# Patient Record
Sex: Female | Born: 1950 | Race: Black or African American | Hispanic: No | Smoking: Never smoker
Health system: Southern US, Community
[De-identification: ages and names within clinical notes are randomized; demographics above are authoritative.]

## PROBLEM LIST (undated history)

## (undated) DIAGNOSIS — K219 Gastro-esophageal reflux disease without esophagitis: Secondary | ICD-10-CM

## (undated) DIAGNOSIS — R0600 Dyspnea, unspecified: Secondary | ICD-10-CM

## (undated) DIAGNOSIS — E039 Hypothyroidism, unspecified: Secondary | ICD-10-CM

## (undated) DIAGNOSIS — M1990 Unspecified osteoarthritis, unspecified site: Secondary | ICD-10-CM

## (undated) DIAGNOSIS — E079 Disorder of thyroid, unspecified: Secondary | ICD-10-CM

## (undated) DIAGNOSIS — N289 Disorder of kidney and ureter, unspecified: Secondary | ICD-10-CM

## (undated) DIAGNOSIS — E669 Obesity, unspecified: Secondary | ICD-10-CM

## (undated) DIAGNOSIS — J45909 Unspecified asthma, uncomplicated: Secondary | ICD-10-CM

## (undated) DIAGNOSIS — R42 Dizziness and giddiness: Secondary | ICD-10-CM

## (undated) DIAGNOSIS — E785 Hyperlipidemia, unspecified: Secondary | ICD-10-CM

## (undated) DIAGNOSIS — I10 Essential (primary) hypertension: Secondary | ICD-10-CM

## (undated) DIAGNOSIS — E119 Type 2 diabetes mellitus without complications: Secondary | ICD-10-CM

## (undated) HISTORY — PX: CARDIAC CATHETERIZATION: SHX172

## (undated) HISTORY — PX: HERNIA REPAIR: SHX51

## (undated) HISTORY — DX: Essential (primary) hypertension: I10

## (undated) HISTORY — DX: Obesity, unspecified: E66.9

## (undated) HISTORY — DX: Type 2 diabetes mellitus without complications: E11.9

## (undated) HISTORY — PX: EYE SURGERY: SHX253

## (undated) HISTORY — DX: Disorder of thyroid, unspecified: E07.9

## (undated) HISTORY — DX: Unspecified osteoarthritis, unspecified site: M19.90

## (undated) HISTORY — DX: Gastro-esophageal reflux disease without esophagitis: K21.9

## (undated) HISTORY — DX: Hyperlipidemia, unspecified: E78.5

## (undated) HISTORY — DX: Dyspnea, unspecified: R06.00

---

## 1985-05-24 DIAGNOSIS — E119 Type 2 diabetes mellitus without complications: Secondary | ICD-10-CM

## 1985-05-24 HISTORY — DX: Type 2 diabetes mellitus without complications: E11.9

## 2005-06-08 ENCOUNTER — Ambulatory Visit: Payer: Self-pay

## 2006-09-19 ENCOUNTER — Other Ambulatory Visit: Payer: Self-pay

## 2006-09-20 ENCOUNTER — Inpatient Hospital Stay: Payer: Self-pay | Admitting: *Deleted

## 2006-10-13 ENCOUNTER — Ambulatory Visit: Payer: Self-pay | Admitting: Family Medicine

## 2008-03-14 ENCOUNTER — Ambulatory Visit: Payer: Self-pay | Admitting: Family Medicine

## 2008-04-01 ENCOUNTER — Ambulatory Visit: Payer: Self-pay | Admitting: Gastroenterology

## 2008-08-04 ENCOUNTER — Ambulatory Visit: Payer: Self-pay | Admitting: Family Medicine

## 2008-08-08 ENCOUNTER — Emergency Department: Payer: Self-pay | Admitting: Unknown Physician Specialty

## 2008-09-13 ENCOUNTER — Encounter: Payer: Self-pay | Admitting: Internal Medicine

## 2008-09-13 ENCOUNTER — Ambulatory Visit: Payer: Self-pay | Admitting: Internal Medicine

## 2008-09-13 DIAGNOSIS — R0609 Other forms of dyspnea: Secondary | ICD-10-CM

## 2008-09-13 DIAGNOSIS — R0602 Shortness of breath: Secondary | ICD-10-CM

## 2008-09-13 DIAGNOSIS — R0989 Other specified symptoms and signs involving the circulatory and respiratory systems: Secondary | ICD-10-CM

## 2008-09-15 LAB — CONVERTED CEMR LAB
Calcium: 9.3 mg/dL (ref 8.4–10.5)
INR: 1 (ref 0.0–1.5)
Lymphocytes Relative: 30 % (ref 12–46)
Lymphs Abs: 0.8 10*3/uL (ref 0.7–4.0)
MCV: 85.6 fL (ref 78.0–100.0)
Monocytes Relative: 8 % (ref 3–12)
Neutro Abs: 1.4 10*3/uL — ABNORMAL LOW (ref 1.7–7.7)
Neutrophils Relative %: 57 % (ref 43–77)
Potassium: 4 meq/L (ref 3.5–5.3)
Prothrombin Time: 13.3 s (ref 11.6–15.2)
RBC: 3.95 M/uL (ref 3.87–5.11)
Sodium: 143 meq/L (ref 135–145)
WBC: 2.5 10*3/uL — ABNORMAL LOW (ref 4.0–10.5)

## 2008-09-16 ENCOUNTER — Inpatient Hospital Stay (HOSPITAL_BASED_OUTPATIENT_CLINIC_OR_DEPARTMENT_OTHER): Admission: RE | Admit: 2008-09-16 | Discharge: 2008-09-16 | Payer: Self-pay | Admitting: Internal Medicine

## 2008-09-16 ENCOUNTER — Ambulatory Visit: Payer: Self-pay | Admitting: Internal Medicine

## 2008-09-25 ENCOUNTER — Ambulatory Visit: Payer: Self-pay | Admitting: Internal Medicine

## 2008-09-25 ENCOUNTER — Encounter: Payer: Self-pay | Admitting: Internal Medicine

## 2008-09-25 DIAGNOSIS — D649 Anemia, unspecified: Secondary | ICD-10-CM | POA: Insufficient documentation

## 2008-09-30 LAB — CONVERTED CEMR LAB
BUN: 31 mg/dL — ABNORMAL HIGH
Basophils Absolute: 0 10*3/uL
Basophils Relative: 1 %
CO2: 23 meq/L
Calcium: 9.4 mg/dL
Chloride: 109 meq/L
Creatinine, Ser: 1.22 mg/dL — ABNORMAL HIGH
Eosinophils Absolute: 0.1 10*3/uL
Eosinophils Relative: 5 %
Glucose, Bld: 65 mg/dL — ABNORMAL LOW
HCT: 36 %
Hemoglobin: 11.2 g/dL — ABNORMAL LOW
Lymphocytes Relative: 38 %
Lymphs Abs: 0.8 10*3/uL
MCHC: 31.1 g/dL
MCV: 87.4 fL
Monocytes Absolute: 0.2 10*3/uL
Monocytes Relative: 12 %
Neutro Abs: 0.9 10*3/uL — ABNORMAL LOW
Neutrophils Relative %: 45 %
Platelets: 179 10*3/uL
Potassium: 4.2 meq/L
RBC: 4.12 M/uL
RDW: 15 %
Sodium: 143 meq/L
WBC: 2 10*3/uL — ABNORMAL LOW

## 2008-10-08 ENCOUNTER — Ambulatory Visit: Payer: Self-pay

## 2008-10-08 ENCOUNTER — Encounter: Payer: Self-pay | Admitting: Internal Medicine

## 2008-12-02 ENCOUNTER — Ambulatory Visit: Payer: Self-pay | Admitting: Otolaryngology

## 2009-03-23 ENCOUNTER — Ambulatory Visit: Payer: Self-pay | Admitting: Internal Medicine

## 2009-04-24 ENCOUNTER — Emergency Department: Payer: Self-pay | Admitting: Emergency Medicine

## 2009-06-11 ENCOUNTER — Ambulatory Visit: Payer: Self-pay | Admitting: Family Medicine

## 2009-09-12 ENCOUNTER — Ambulatory Visit: Payer: Self-pay | Admitting: Family Medicine

## 2009-10-20 ENCOUNTER — Ambulatory Visit: Payer: Self-pay | Admitting: Family Medicine

## 2009-10-22 ENCOUNTER — Ambulatory Visit: Payer: Self-pay | Admitting: Family Medicine

## 2010-05-20 ENCOUNTER — Ambulatory Visit: Payer: Self-pay | Admitting: Family Medicine

## 2010-07-25 ENCOUNTER — Ambulatory Visit: Payer: Self-pay | Admitting: Internal Medicine

## 2010-09-02 LAB — POCT I-STAT 3, ART BLOOD GAS (G3+)
Acid-base deficit: 2 mmol/L (ref 0.0–2.0)
O2 Saturation: 91 %
TCO2: 24 mmol/L (ref 0–100)
pCO2 arterial: 37 mmHg (ref 35.0–45.0)
pO2, Arterial: 60 mmHg — ABNORMAL LOW (ref 80.0–100.0)

## 2010-09-02 LAB — POCT I-STAT 3, VENOUS BLOOD GAS (G3P V)
Acid-base deficit: 1 mmol/L (ref 0.0–2.0)
Acid-base deficit: 1 mmol/L (ref 0.0–2.0)
Acid-base deficit: 3 mmol/L — ABNORMAL HIGH (ref 0.0–2.0)
Bicarbonate: 23 mEq/L (ref 20.0–24.0)
Bicarbonate: 24.7 mEq/L — ABNORMAL HIGH (ref 20.0–24.0)
O2 Saturation: 65 %
O2 Saturation: 92 %
TCO2: 24 mmol/L (ref 0–100)
TCO2: 25 mmol/L (ref 0–100)
TCO2: 26 mmol/L (ref 0–100)
pH, Ven: 7.36 — ABNORMAL HIGH (ref 7.250–7.300)
pO2, Ven: 33 mmHg (ref 30.0–45.0)
pO2, Ven: 36 mmHg (ref 30.0–45.0)

## 2010-09-02 LAB — POCT I-STAT GLUCOSE: Glucose, Bld: 111 mg/dL — ABNORMAL HIGH (ref 70–99)

## 2010-09-14 ENCOUNTER — Emergency Department: Payer: Self-pay | Admitting: Emergency Medicine

## 2010-09-24 ENCOUNTER — Emergency Department: Payer: Self-pay | Admitting: Emergency Medicine

## 2010-10-01 ENCOUNTER — Telehealth: Payer: Self-pay | Admitting: Internal Medicine

## 2010-10-01 NOTE — Telephone Encounter (Signed)
Faxed OV & Echo to [facility] ([phone]).

## 2010-10-02 ENCOUNTER — Ambulatory Visit: Payer: Self-pay | Admitting: General Surgery

## 2010-10-06 ENCOUNTER — Ambulatory Visit: Payer: Self-pay | Admitting: General Surgery

## 2010-10-06 NOTE — Cardiovascular Report (Signed)
NAMEADRI, [name] NO.:  [id number]   MEDICAL RECORD NO.:  [id number]          PATIENT TYPE:  OIB   LOCATION:  1961                         FACILITY:  [facility]   PHYSICIAN:  Bevelyn Buckles. Bensimhon, MDDATE OF BIRTH:  [birth year]   DATE OF PROCEDURE:  09/16/2008  DATE OF DISCHARGE:                            CARDIAC CATHETERIZATION   PRIMARY CARE PHYSICIAN:  Vonita Moss, MD   PATIENT IDENTIFICATION:  Mary Parks is a delightful 60 year old woman  who presented to the office with chest pain and dyspnea.  Stress test  versus cardiac catheterization were discussed and we decided to proceed  with right and left heart catheterization.   PROCEDURES PERFORMED:  1. Right heart catheterization.  2. Left heart catheterization.  3. Selective coronary angiography.  4. Left ventriculogram.   DESCRIPTION OF PROCEDURE:  The risks and indications were explained,  consent was signed and placed on the chart.  A 4-French arterial sheath  was placed in the right femoral artery using modified Seldinger  technique.  Standard catheters including JL-4, JR-4, and angled pigtail  were used for procedure.  All catheter exchanges were made over wire.  There were no apparent complications.  A 7-French venous sheath was  placed in the right femoral vein and standard Swan-Ganz catheter was  used for right heart cath.  Once again, no apparent complications.   HEMODYNAMIC RESULTS:  Right atrial pressure mean of 11, RV pressure 35/8  with an EDP of 15.  PA pressure 37/17 with a mean of 26.  Pulmonary  capillary wedge pressure mean of 15.  Central aortic pressure 177/92  with a mean of 127.  LV pressure 178/26 with an EDP of 36.  There is no  aortic stenosis.  Fick cardiac output was 5.7 L/min.  Cardiac index 2.9  L/min/m2.  Pulmonary vascular resistance was 2.1 [place] [facility].  Arterial  saturations on room air were 91% and 92%.   Left main was normal.   LAD was moderate-sized vessel that gave  off two diagonals.  The distal  LAD was essentially bifid.  It was angiographically normal.   Left circumflex gave off a moderate-sized ramus branch, multiple small  OMs and a large OM branch.  It was angiographically normal.   Right coronary artery had an anterior takeoff as a very large dominant  vessel that gave off a large marginal branch, small-to-moderate PDA and  a very large posterolateral.  It was angiographically normal.   Left ventriculogram done in the RAO position showed an ejection fraction  of 60% with no regional wall motion abnormalities.  On panning down over  the distal aorta after the ventriculogram, there was no evidence of  aneurysmal dilatation.   ASSESSMENT:  1. Normal coronary arteries.  2. Normal left ventricular function.  3. Mild pulmonary venous hypertension in the setting of markedly      elevated left-sided pressures likely due to diastolic dysfunction.   PLAN/DISCUSSION:  I suspect her symptoms are related to diastolic  dysfunction and significantly elevated left ventricular end-diastolic  pressures.  I am little bit perplexed as to why her wedge  pressure is  high.  At this point, we will start low-dose diuretic and potassium in  the form of hydrochlorothiazide 12.5 once a day and 20 mEq potassium  daily.  She will also need tight blood pressure control.  I have also  discussed with her the possibility of a sleep study due to probable  underlying obstructive sleep apnea.  She will follow up with me next  week in the clinic and also see Dr. Dossie Arbour.      Bevelyn Buckles. Bensimhon, MD  Electronically Signed     DRB/MEDQ  D:  09/16/2008  T:  09/16/2008  Job:  [id number]   cc:   Vonita Moss, M.D.

## 2010-11-02 ENCOUNTER — Encounter: Payer: Self-pay | Admitting: Internal Medicine

## 2011-01-04 ENCOUNTER — Ambulatory Visit: Payer: Self-pay

## 2011-06-02 ENCOUNTER — Ambulatory Visit: Payer: Self-pay | Admitting: Family Medicine

## 2011-11-30 ENCOUNTER — Encounter: Payer: Self-pay | Admitting: Orthopedic Surgery

## 2011-12-23 ENCOUNTER — Encounter: Payer: Self-pay | Admitting: Orthopedic Surgery

## 2012-04-27 ENCOUNTER — Ambulatory Visit: Payer: Self-pay | Admitting: Internal Medicine

## 2012-07-20 ENCOUNTER — Ambulatory Visit: Payer: Self-pay | Admitting: Family Medicine

## 2013-03-22 ENCOUNTER — Ambulatory Visit: Payer: Self-pay

## 2013-03-22 LAB — URINALYSIS, COMPLETE
Bacteria: NEGATIVE
Glucose,UR: 100 mg/dL (ref 0–75)
Ketone: NEGATIVE
Leukocyte Esterase: NEGATIVE
Nitrite: NEGATIVE

## 2013-03-24 LAB — URINE CULTURE

## 2013-08-10 ENCOUNTER — Ambulatory Visit: Payer: Self-pay | Admitting: Family Medicine

## 2014-06-02 ENCOUNTER — Ambulatory Visit: Payer: Self-pay

## 2014-06-02 DIAGNOSIS — H6990 Unspecified Eustachian tube disorder, unspecified ear: Secondary | ICD-10-CM | POA: Diagnosis not present

## 2014-06-19 DIAGNOSIS — N179 Acute kidney failure, unspecified: Secondary | ICD-10-CM | POA: Diagnosis not present

## 2014-06-24 DIAGNOSIS — E1122 Type 2 diabetes mellitus with diabetic chronic kidney disease: Secondary | ICD-10-CM | POA: Diagnosis not present

## 2014-06-24 DIAGNOSIS — N183 Chronic kidney disease, stage 3 (moderate): Secondary | ICD-10-CM | POA: Diagnosis not present

## 2014-07-10 DIAGNOSIS — J329 Chronic sinusitis, unspecified: Secondary | ICD-10-CM | POA: Diagnosis not present

## 2014-08-05 DIAGNOSIS — N183 Chronic kidney disease, stage 3 (moderate): Secondary | ICD-10-CM | POA: Diagnosis not present

## 2014-08-05 DIAGNOSIS — I129 Hypertensive chronic kidney disease with stage 1 through stage 4 chronic kidney disease, or unspecified chronic kidney disease: Secondary | ICD-10-CM | POA: Diagnosis not present

## 2014-08-05 DIAGNOSIS — E785 Hyperlipidemia, unspecified: Secondary | ICD-10-CM | POA: Diagnosis not present

## 2014-08-05 DIAGNOSIS — N185 Chronic kidney disease, stage 5: Secondary | ICD-10-CM | POA: Diagnosis not present

## 2014-08-05 DIAGNOSIS — R1010 Upper abdominal pain, unspecified: Secondary | ICD-10-CM | POA: Diagnosis not present

## 2014-08-05 DIAGNOSIS — Z0000 Encounter for general adult medical examination without abnormal findings: Secondary | ICD-10-CM | POA: Diagnosis not present

## 2014-09-12 ENCOUNTER — Ambulatory Visit
Admit: 2014-09-12 | Disposition: A | Discharge status: Home / Self Care | Payer: Self-pay | Attending: Unknown Physician Specialty | Admitting: Unknown Physician Specialty

## 2014-09-12 DIAGNOSIS — Z1231 Encounter for screening mammogram for malignant neoplasm of breast: Secondary | ICD-10-CM | POA: Diagnosis not present

## 2014-09-12 LAB — HM MAMMOGRAPHY

## 2014-10-22 DIAGNOSIS — J441 Chronic obstructive pulmonary disease with (acute) exacerbation: Secondary | ICD-10-CM | POA: Diagnosis not present

## 2014-11-05 ENCOUNTER — Ambulatory Visit
Admission: RE | Admit: 2014-11-05 | Discharge: 2014-11-05 | Disposition: A | Discharge status: Home / Self Care | Payer: Medicare Other | Source: Ambulatory Visit | Attending: Family Medicine | Admitting: Family Medicine

## 2014-11-05 ENCOUNTER — Ambulatory Visit (INDEPENDENT_AMBULATORY_CARE_PROVIDER_SITE_OTHER): Payer: Medicare Other | Admitting: Family Medicine

## 2014-11-05 ENCOUNTER — Telehealth: Payer: Self-pay

## 2014-11-05 ENCOUNTER — Encounter: Payer: Self-pay | Admitting: Family Medicine

## 2014-11-05 VITALS — BP 108/72 | HR 106 | Temp 98.4°F | Ht 64.0 in | Wt 175.0 lb

## 2014-11-05 DIAGNOSIS — R05 Cough: Secondary | ICD-10-CM | POA: Diagnosis not present

## 2014-11-05 DIAGNOSIS — R0789 Other chest pain: Secondary | ICD-10-CM | POA: Diagnosis not present

## 2014-11-05 DIAGNOSIS — R062 Wheezing: Secondary | ICD-10-CM | POA: Insufficient documentation

## 2014-11-05 DIAGNOSIS — J441 Chronic obstructive pulmonary disease with (acute) exacerbation: Secondary | ICD-10-CM | POA: Diagnosis not present

## 2014-11-05 MED ORDER — PREDNISONE 10 MG PO TABS: ORAL_TABLET | ORAL | Status: AC

## 2014-11-05 MED ORDER — DOXYCYCLINE HYCLATE 100 MG PO CAPS: 100.0000 mg | ORAL_CAPSULE | Freq: Two times a day (BID) | ORAL | Status: AC

## 2014-11-05 NOTE — Progress Notes (Signed)
BP 108/72 mmHg  Pulse 106  Temp(Src) 98.4 F (36.9 C)  Ht 5\' 4"  (1.626 m)  Wt 175 lb (79.379 kg)  BMI 30.02 kg/m2  SpO2 99%   Subjective:    Patient ID: Mary Parks, female    DOB: [birth year], 64 y.o.   MRN: [id number]  HPI: Mary Parks is a 64 y.o. female  Chief Complaint  Patient presents with  . Bronchitis    follow up  COPD EXACERBATION RECHECK- Amiee presents today for a recheck on her lungs following a COPD exacerbation 2 weeks ago. She was treated with prednisone, z-pack, cough syrup and tessalon perles. She notes that today she is feeling a bit better, but still having some issues. Eyes still feeling cloudy and itchy and dry, still getting some pains in her lungs  Worst symptom: bad taste in her mouth, wheezing Fever: no Cough: yes Shortness of breath: no Wheezing: yes Chest pain: yes, with cough Chest tightness: yes Chest congestion: yes Nasal congestion: yes Runny nose: yes Post nasal drip: yes Sneezing: yes Sore throat: yes Swollen glands: no Sinus pressure: no Headache: no Face pain: no Toothache: no Ear pain: no  Ear pressure: no  Eyes red/itching:yes Eye drainage/crusting: no  Vomiting: no Rash: no Fatigue: no Sick contacts: no Strep contacts: no  Context: better Recurrent sinusitis: no Relief with OTC cold/cough medications: no  Treatments attempted: cold/sinus, mucinex, anti-histamine, pseudoephedrine, cough syrup and antibiotics   Relevant past medical, surgical, family and social history reviewed and updated as indicated. Interim medical history since our last visit reviewed. Allergies and medications reviewed and updated.  Review of Systems  Constitutional: Negative.   HENT: Negative.   Respiratory: Negative.   Cardiovascular: Negative.   Allergic/Immunologic: Positive for environmental allergies. Negative for food allergies and immunocompromised state.  Psychiatric/Behavioral: Negative.     Per HPI unless  specifically indicated above     Objective:    BP 108/72 mmHg  Pulse 106  Temp(Src) 98.4 F (36.9 C)  Ht 5\' 4"  (1.626 m)  Wt 175 lb (79.379 kg)  BMI 30.02 kg/m2  SpO2 99%  Wt Readings from Last 3 Encounters:  11/05/14 175 lb (79.379 kg)  10/22/14 178 lb (80.74 kg)  09/13/08 202 lb 12 oz (91.967 kg)    Physical Exam  Constitutional: She appears well-developed and well-nourished.  HENT:  Head: Normocephalic and atraumatic.  Right Ear: External ear normal.  Left Ear: External ear normal.  Nose: Nose normal.  Mouth/Throat: Oropharynx is clear and moist. No oropharyngeal exudate.  Eyes: Conjunctivae and EOM are normal. Pupils are equal, round, and reactive to light. Right eye exhibits no discharge. No scleral icterus.  Neck: Normal range of motion. Neck supple. No JVD present. No tracheal deviation present. No thyromegaly present.  Cardiovascular: Normal rate, regular rhythm and normal heart sounds.  Exam reveals no gallop and no friction rub.   No murmur heard. Pulmonary/Chest: Effort normal. No stridor. No respiratory distress. She has wheezes. She has no rales. She exhibits no tenderness.  Lymphadenopathy:    She has no cervical adenopathy.  Skin: Skin is warm and dry.  Psychiatric: She has a normal mood and affect. Her behavior is normal. Judgment and thought content normal.  Nursing note and vitals reviewed.     Assessment & Plan:   Problem List Items Addressed This Visit      Respiratory   COPD exacerbation - Primary    Will obtain CXR to make sure there is not a pneumonia.  Wheezes and rhonchi at the bases bilaterally. Will start doxycycline, and will switch to Levaquin if evidence of pneumonia on CXR. Will do another course of prednisone taper. Will have her call with concerns or if she is not doing better or doing worse. Will recheck her lungs in 2 weeks to confirm resolution.       Relevant Medications   predniSONE (DELTASONE) 10 MG tablet   Other Relevant Orders    DG Chest 2 View       Follow up plan: Return in about 2 weeks (around 11/19/2014), or Lung recheck.

## 2014-11-05 NOTE — Patient Instructions (Signed)
Shortness of Breath Shortness of breath means you have trouble breathing. Shortness of breath needs medical care right away. HOME CARE   Do not smoke.  Avoid being around chemicals or things (paint fumes, dust) that may bother your breathing.  Rest as needed. Slowly begin your normal activities.  Only take medicines as told by your doctor.  Keep all doctor visits as told. GET HELP RIGHT AWAY IF:   Your shortness of breath gets worse.  You feel lightheaded, pass out (faint), or have a cough that is not helped by medicine.  You cough up blood.  You have pain with breathing.  You have pain in your chest, arms, shoulders, or belly (abdomen).  You have a fever.  You cannot walk up stairs or exercise the way you normally do.  You do not get better in the time expected.  You have a hard time doing normal activities even with rest.  You have problems with your medicines.  You have any new symptoms. MAKE SURE YOU:  Understand these instructions.  Will watch your condition.  Will get help right away if you are not doing well or get worse. Document Released: 10/27/2007 Document Revised: 05/15/2013 Document Reviewed: 07/26/2011 [facility] Patient Information 2015 [place], [place]. This information is not intended to replace advice given to you by your health care provider. Make sure you discuss any questions you have with your health care provider. Allergic Rhinitis Allergic rhinitis is when the mucous membranes in the nose respond to allergens. Allergens are particles in the air that cause your body to have an allergic reaction. This causes you to release allergic antibodies. Through a chain of events, these eventually cause you to release histamine into the blood stream. Although meant to protect the body, it is this release of histamine that causes your discomfort, such as frequent sneezing, congestion, and an itchy, runny nose.  CAUSES  Seasonal allergic rhinitis (hay fever) is  caused by pollen allergens that may come from grasses, trees, and weeds. Year-round allergic rhinitis (perennial allergic rhinitis) is caused by allergens such as house dust mites, pet dander, and mold spores.  SYMPTOMS   Nasal stuffiness (congestion).  Itchy, runny nose with sneezing and tearing of the eyes. DIAGNOSIS  Your health care provider can help you determine the allergen or allergens that trigger your symptoms. If you and your health care provider are unable to determine the allergen, skin or blood testing may be used. TREATMENT  Allergic rhinitis does not have a cure, but it can be controlled by:  Medicines and allergy shots (immunotherapy).  Avoiding the allergen. Hay fever may often be treated with antihistamines in pill or nasal spray forms. Antihistamines block the effects of histamine. There are over-the-counter medicines that may help with nasal congestion and swelling around the eyes. Check with your health care provider before taking or giving this medicine.  If avoiding the allergen or the medicine prescribed do not work, there are many new medicines your health care provider can prescribe. Stronger medicine may be used if initial measures are ineffective. Desensitizing injections can be used if medicine and avoidance does not work. Desensitization is when a patient is given ongoing shots until the body becomes less sensitive to the allergen. Make sure you follow up with your health care provider if problems continue. HOME CARE INSTRUCTIONS It is not possible to completely avoid allergens, but you can reduce your symptoms by taking steps to limit your exposure to them. It helps to know exactly what  you are allergic to so that you can avoid your specific triggers. SEEK MEDICAL CARE IF:   You have a fever.  You develop a cough that does not stop easily (persistent).  You have shortness of breath.  You start wheezing.  Symptoms interfere with normal daily  activities. Document Released: 02/02/2001 Document Revised: 05/15/2013 Document Reviewed: 01/15/2013 [facility] Patient Information 2015 [place], [place]. This information is not intended to replace advice given to you by your health care provider. Make sure you discuss any questions you have with your health care provider.

## 2014-11-05 NOTE — Addendum Note (Signed)
Addended byRachell Cipro, AMY J on: 11/05/2014 11:06 AM   Modules accepted: Orders

## 2014-11-05 NOTE — Assessment & Plan Note (Signed)
Will obtain CXR to make sure there is not a pneumonia. Wheezes and rhonchi at the bases bilaterally. Will start doxycycline, and will switch to Levaquin if evidence of pneumonia on CXR. Will do another course of prednisone taper. Will have her call with concerns or if she is not doing better or doing worse. Will recheck her lungs in 2 weeks to confirm resolution.

## 2014-11-05 NOTE — Telephone Encounter (Signed)
Pharmacy called to verify that the paper that was brought to them was not actually a prescription. I spoke with Dr.Johnson, it was her order for her chest x-ray. Notified pharmacy of this.

## 2014-11-19 ENCOUNTER — Encounter: Payer: Self-pay | Admitting: Unknown Physician Specialty

## 2014-11-19 ENCOUNTER — Ambulatory Visit (INDEPENDENT_AMBULATORY_CARE_PROVIDER_SITE_OTHER): Payer: Medicare Other | Admitting: Unknown Physician Specialty

## 2014-11-19 VITALS — BP 112/74 | HR 102 | Temp 98.3°F | Ht 63.3 in | Wt 176.6 lb

## 2014-11-19 DIAGNOSIS — J45909 Unspecified asthma, uncomplicated: Secondary | ICD-10-CM | POA: Insufficient documentation

## 2014-11-19 DIAGNOSIS — H1013 Acute atopic conjunctivitis, bilateral: Secondary | ICD-10-CM

## 2014-11-19 DIAGNOSIS — H1010 Acute atopic conjunctivitis, unspecified eye: Secondary | ICD-10-CM | POA: Insufficient documentation

## 2014-11-19 DIAGNOSIS — J449 Chronic obstructive pulmonary disease, unspecified: Secondary | ICD-10-CM | POA: Insufficient documentation

## 2014-11-19 DIAGNOSIS — J410 Simple chronic bronchitis: Secondary | ICD-10-CM

## 2014-11-19 MED ORDER — CROMOLYN SODIUM 4 % OP SOLN: 1.0000 [drp] | Freq: Four times a day (QID) | OPHTHALMIC | Status: DC

## 2014-11-19 NOTE — Progress Notes (Signed)
   BP 112/74 mmHg  Pulse 102  Temp(Src) 98.3 F (36.8 C)  Ht 5' 3.3" (1.608 m)  Wt 176 lb 9.6 oz (80.105 kg)  BMI 30.98 kg/m2  SpO2 99%  LMP  (LMP Unknown)   Subjective:    Patient ID: Mary Parks, female    DOB: 17-Apr-1951, 64 y.o.   MRN: [id number]  HPI: Mary Parks is a 64 y.o. female  Chief Complaint  Patient presents with  . Bronchitis   Pt is here to f/u of COPD exacerbation.  Doing better but has not energy.  On further evaluation, takes the Symbicort "when I need it" rather than for maintenance.  This is her 2nd or 3rd COPD exacerbation.    Relevant past medical, surgical, family and social history reviewed and updated as indicated. Interim medical history since our last visit reviewed. Allergies and medications reviewed and updated.  Still has trouble with a runny nose and nasal congestion.   Allergic conjunctivitis: Eyes are driving her crazy with drainage and feels "like gravels" in yes.     Review of Systems  Per HPI unless specifically indicated above     Objective:    BP 112/74 mmHg  Pulse 102  Temp(Src) 98.3 F (36.8 C)  Ht 5' 3.3" (1.608 m)  Wt 176 lb 9.6 oz (80.105 kg)  BMI 30.98 kg/m2  SpO2 99%  LMP  (LMP Unknown)  Wt Readings from Last 3 Encounters:  11/19/14 176 lb 9.6 oz (80.105 kg)  11/05/14 175 lb (79.379 kg)  10/22/14 178 lb (80.74 kg)    Physical Exam  Constitutional: She is oriented to person, place, and time. She appears well-developed and well-nourished. No distress.  HENT:  Head: Normocephalic and atraumatic.  Eyes: Conjunctivae and lids are normal. Right eye exhibits no discharge. Left eye exhibits no discharge. No scleral icterus.  Cardiovascular: Normal rate and regular rhythm.   Pulmonary/Chest: Effort normal and breath sounds normal. No respiratory distress.  Abdominal: Normal appearance and bowel sounds are normal. There is no splenomegaly or hepatomegaly.  Musculoskeletal: Normal range of motion.   Neurological: She is alert and oriented to person, place, and time.  Skin: Skin is intact. No rash noted. No pallor.  Psychiatric: She has a normal mood and affect. Her behavior is normal. Judgment and thought content normal.     Assessment & Plan:   Problem List Items Addressed This Visit      Respiratory   COPD (chronic obstructive pulmonary disease) - Primary    Take Symbicort daily as this is the 2nd COPD exacerbation in a short time        Other   Allergic conjunctivitis    Try Crolom eye drops      Relevant Medications   cromolyn (OPTICROM) 4 % ophthalmic solution       Follow up plan: Return in about 4 weeks (around 12/17/2014) for chronic disease f/u.

## 2014-11-19 NOTE — Assessment & Plan Note (Signed)
Take Symbicort daily as this is the 2nd COPD exacerbation in a short time

## 2014-11-19 NOTE — Assessment & Plan Note (Signed)
Try Crolom eye drops

## 2014-11-23 ENCOUNTER — Emergency Department
Admission: EM | Admit: 2014-11-23 | Discharge: 2014-11-23 | Disposition: A | Discharge status: Home / Self Care | Payer: Medicare Other | Attending: Emergency Medicine | Admitting: Emergency Medicine

## 2014-11-23 ENCOUNTER — Other Ambulatory Visit: Payer: Self-pay

## 2014-11-23 DIAGNOSIS — Z79899 Other long term (current) drug therapy: Secondary | ICD-10-CM | POA: Diagnosis not present

## 2014-11-23 DIAGNOSIS — E119 Type 2 diabetes mellitus without complications: Secondary | ICD-10-CM | POA: Insufficient documentation

## 2014-11-23 DIAGNOSIS — I959 Hypotension, unspecified: Secondary | ICD-10-CM | POA: Diagnosis not present

## 2014-11-23 DIAGNOSIS — Z794 Long term (current) use of insulin: Secondary | ICD-10-CM | POA: Insufficient documentation

## 2014-11-23 DIAGNOSIS — Z7982 Long term (current) use of aspirin: Secondary | ICD-10-CM | POA: Insufficient documentation

## 2014-11-23 DIAGNOSIS — I10 Essential (primary) hypertension: Secondary | ICD-10-CM | POA: Insufficient documentation

## 2014-11-23 DIAGNOSIS — Z7951 Long term (current) use of inhaled steroids: Secondary | ICD-10-CM | POA: Diagnosis not present

## 2014-11-23 DIAGNOSIS — M6281 Muscle weakness (generalized): Secondary | ICD-10-CM | POA: Diagnosis not present

## 2014-11-23 DIAGNOSIS — R55 Syncope and collapse: Secondary | ICD-10-CM | POA: Insufficient documentation

## 2014-11-23 LAB — BASIC METABOLIC PANEL
ANION GAP: 13 (ref 5–15)
BUN: 18 mg/dL (ref 6–20)
CHLORIDE: 112 mmol/L — AB (ref 101–111)
CO2: 21 mmol/L — AB (ref 22–32)
CREATININE: 1.6 mg/dL — AB (ref 0.44–1.00)
Calcium: 9.5 mg/dL (ref 8.9–10.3)
GFR calc Af Amer: 38 mL/min — ABNORMAL LOW (ref >60)
GFR, EST NON AFRICAN AMERICAN: 33 mL/min — AB (ref >60)
Glucose, Bld: 183 mg/dL — ABNORMAL HIGH (ref 65–99)
POTASSIUM: 3.7 mmol/L (ref 3.5–5.1)
Sodium: 146 mmol/L — ABNORMAL HIGH (ref 135–145)

## 2014-11-23 LAB — CBC
HCT: 37.2 % (ref 35.0–47.0)
HEMOGLOBIN: 11.9 g/dL — AB (ref 12.0–16.0)
MCH: 27.2 pg (ref 26.0–34.0)
MCHC: 31.9 g/dL — AB (ref 32.0–36.0)
MCV: 85.4 fL (ref 80.0–100.0)
Platelets: 159 10*3/uL (ref 150–440)
RBC: 4.35 MIL/uL (ref 3.80–5.20)
RDW: 15.7 % — ABNORMAL HIGH (ref 11.5–14.5)
WBC: 4.9 10*3/uL (ref 3.6–11.0)

## 2014-11-23 LAB — URINALYSIS COMPLETE WITH MICROSCOPIC (ARMC ONLY)
BILIRUBIN URINE: NEGATIVE
Bacteria, UA: NONE SEEN
Glucose, UA: >500 mg/dL — AB
Hgb urine dipstick: NEGATIVE
KETONES UR: NEGATIVE mg/dL
Leukocytes, UA: NEGATIVE
NITRITE: NEGATIVE
PH: 5 (ref 5.0–8.0)
PROTEIN: NEGATIVE mg/dL
Specific Gravity, Urine: 1.026 (ref 1.005–1.030)

## 2014-11-23 LAB — TROPONIN I: Troponin I: <0.03 ng/mL (ref <0.031)

## 2014-11-23 NOTE — Discharge Instructions (Signed)
I suspect he passed out due to dehydration and heat. Your exam and evaluation are reassuring. Return to emergency department for any fever, chest pain, trouble breathing, shortness of breath, weakness, numbness, or any altered mental status.   Syncope Syncope means a person passes out (faints). The person usually wakes up in less than 5 minutes. It is important to seek medical care for syncope. HOME CARE  Have someone stay with you until you feel normal.  Do not drive, use machines, or play sports until your doctor says it is okay.  Keep all doctor visits as told.  Lie down when you feel like you might pass out. Take deep breaths. Wait until you feel normal before standing up.  Drink enough fluids to keep your pee (urine) clear or pale yellow.  If you take blood pressure or heart medicine, get up slowly. Take several minutes to sit and then stand. GET HELP RIGHT AWAY IF:   You have a severe headache.  You have pain in the chest, belly (abdomen), or back.  You are bleeding from the mouth or butt (rectum).  You have black or tarry poop (stool).  You have an irregular or very fast heartbeat.  You have pain with breathing.  You keep passing out, or you have shaking (seizures) when you pass out.  You pass out when sitting or lying down.  You feel confused.  You have trouble walking.  You have severe weakness.  You have vision problems. If you fainted, call your local emergency services (911 in [place] Do not drive yourself to the hospital. MAKE SURE YOU:   Understand these instructions.  Will watch your condition.  Will get help right away if you are not doing well or get worse. Document Released: 10/27/2007 Document Revised: 11/09/2011 Document Reviewed: 07/09/2011 [facility] Patient Information 2015 [place], [place]. This information is not intended to replace advice given to you by your health care provider. Make sure you discuss any questions you have with your health  care provider.

## 2014-11-23 NOTE — ED Notes (Signed)
Pt to ED via [facility] after syncopal episode while at cookout, denies hitting her head. Witnessed fall. Pt states that she has had decreased intake today. Hypotensive with EMS, 44'Q systolic, tachycardic in 286'N HR. Pt received approx 400cc NS en route with EMS. Pt alert and oriented X4, active, cooperative, pt in NAD. RR even and unlabored, color WNL.

## 2014-11-23 NOTE — ED Notes (Signed)
Orthostatics VS-R arm  Lying- HR 98, BP 103/54 Sitting- HR 100, BP 108/66 Standing- HR 107, BP 105/79

## 2014-11-23 NOTE — ED Provider Notes (Signed)
[facility] Emergency Department Provider Note   ____________________________________________  Time seen: 5:30 PM I have reviewed the triage vital signs and the triage nursing note.  HISTORY  Chief Complaint Loss of Consciousness and Hypotension   Historian Patient and her sister  HPI Mary Parks is a 64 y.o. female who has a history of diabetes and hypertension, as well as obesity who was getting ready for a family cookout this afternoon when she passed out. She was filling up the ice chest out in the garage where was extremely hot. She started feeling lightheaded like she might pass out, and then woke up with "20 people around her. "She had no palpitations, chest pain, shortness of breath, or seizure. She had no postictal period. She has passed out once about a year ago. She is a diabetic and reported no low blood sugars. She did have breakfast and a cheeseburger for lunch. No traumatic injury. She feels much better now. She had a low blood pressure with EMS as well as tachycardia.    Past Medical History  Diagnosis Date  . HTN (hypertension)   . Obesity   . DM2 (diabetes mellitus, type 2) 1987  . Hyperlipidemia   . GERD (gastroesophageal reflux disease)   . Osteoarthritis   . Chest pain   . Dyspnea     Cath normal coronaries. normal EF. RA 11. PA 37/17 (26) LVEDP 36  . Thyroid disease     Patient Active Problem List   Diagnosis Date Noted  . COPD (chronic obstructive pulmonary disease) 11/19/2014  . Allergic conjunctivitis 11/19/2014  . COPD exacerbation 11/05/2014  . UNSPECIFIED ANEMIA 09/25/2008  . DYSPNEA 09/13/2008  . SNORING 09/13/2008    Past Surgical History  Procedure Laterality Date  . Cardiac catheterization      Current Outpatient Rx  Name  Route  Sig  Dispense  Refill  . amLODipine (NORVASC) 5 MG tablet   Oral   Take 5 mg by mouth daily.         Marland Kitchen aspirin 81 MG EC tablet   Oral   Take 81 mg by mouth daily.            Marland Kitchen atorvastatin (LIPITOR) 40 MG tablet   Oral   Take 40 mg by mouth daily.         . benazepril (LOTENSIN) 40 MG tablet   Oral   Take 40 mg by mouth daily.           . budesonide-formoterol (SYMBICORT) 160-4.5 MCG/ACT inhaler   Inhalation   Inhale 2 puffs into the lungs 2 (two) times daily.         . canagliflozin (INVOKANA) 300 MG TABS tablet   Oral   Take 300 mg by mouth daily before breakfast.         . Cholecalciferol (D 2000) 2000 UNITS TABS   Oral   Take by mouth.         . cromolyn (OPTICROM) 4 % ophthalmic solution   Both Eyes   Place 1 drop into both eyes 4 (four) times daily.   10 mL   12   . fluticasone (FLONASE) 50 MCG/ACT nasal spray   Each Nare   Place 2 sprays into both nostrils daily.         . insulin glargine (LANTUS) 100 UNIT/ML injection   Subcutaneous   Inject 40-60 Units into the skin at bedtime.         . insulin lispro (  HUMALOG KWIKPEN) 100 UNIT/ML KiwkPen   Subcutaneous   Inject into the skin. 20 Units before breakfast, 20 Units before lunch and 30Units before evening meal         . lisinopril (PRINIVIL,ZESTRIL) 20 MG tablet   Oral   Take 20 mg by mouth daily.         Marland Kitchen omeprazole (PRILOSEC OTC) 20 MG tablet   Oral   Take 20 mg by mouth daily.         Marland Kitchen omeprazole (PRILOSEC) 20 MG capsule               . pregabalin (LYRICA) 100 MG capsule   Oral   Take 100 mg by mouth 2 (two) times daily.         . simvastatin (ZOCOR) 40 MG tablet   Oral   Take 40 mg by mouth at bedtime.           . traMADol (ULTRAM) 50 MG tablet   Oral   Take 50-100 mg by mouth every 6 (six) hours as needed.           Allergies Celebrex; Sulfa antibiotics; Sulfisoxazole; and Sulfur  Family History  Problem Relation Age of Onset  . Diabetes Mother   . Diabetes Father   . Diabetes Sister   . Diabetes Brother   . Heart disease Neg Hx   . Coronary artery disease Neg Hx   . Diabetes Sister   . Diabetes Sister   .  Diabetes Maternal Grandmother   . Diabetes Maternal Grandfather   . Diabetes Paternal Grandmother   . Diabetes Paternal Grandfather     Social History History  Substance Use Topics  . Smoking status: Never Smoker   . Smokeless tobacco: Never Used  . Alcohol Use: No    Review of Systems  Constitutional: Negative for fever. Eyes: Negative for visual changes. ENT: Negative for sore throat. Cardiovascular: Negative for chest pain. Respiratory: Negative for shortness of breath. Gastrointestinal: Negative for abdominal pain, vomiting and diarrhea. Genitourinary: Negative for dysuria. Musculoskeletal: Negative for back pain. Skin: Negative for rash. Neurological: Negative for headaches, focal weakness or numbness. 10 point Review of Systems otherwise negative ____________________________________________   PHYSICAL EXAM:  VITAL SIGNS: ED Triage Vitals  Enc Vitals Group     BP 11/23/14 1638 105/64 mmHg     Pulse Rate 11/23/14 1638 115     Resp 11/23/14 1638 18     Temp 11/23/14 1638 99.6 F (37.6 C)     Temp Source 11/23/14 1638 Oral     SpO2 11/23/14 1638 97 %     Weight 11/23/14 1638 175 lb (79.379 kg)     Height 11/23/14 1638 5\' 4"  (1.626 m)     Head Cir --      Peak Flow --      Pain Score --      Pain Loc --      Pain Edu? --      Excl. in [place]? --      Constitutional: Alert and oriented. Well appearing and in no distress. Eyes: Conjunctivae are normal. PERRL. Normal extraocular movements. ENT   Head: Normocephalic and atraumatic.   Nose: No congestion/rhinnorhea.   Mouth/Throat: Mucous membranes are moist.   Neck: No stridor. Cardiovascular/Chest: Normal rate, regular rhythm.  No murmurs, rubs, or gallops. Respiratory: Normal respiratory effort without tachypnea nor retractions. Breath sounds are clear and equal bilaterally. No wheezes/rales/rhonchi. Gastrointestinal: Soft. No distention, no guarding, no rebound. Nontender  Genitourinary/rectal:Deferred Musculoskeletal: Nontender with normal range of motion in all extremities. No joint effusions.  No lower extremity tenderness nor edema. Neurologic:  Normal speech and language. No gross focal neurologic deficits are appreciated. Skin:  Skin is warm, dry and intact. No rash noted. Psychiatric: Mood and affect are normal. Speech and behavior are normal. Patient exhibits appropriate insight and judgment.  ____________________________________________   EKG I, Lisa Roca, MD, the attending physician have personally viewed and interpreted all ECGs.  110 bpm. Sinus tachycardia. Narrow QRS. Normal axis. T waves inverted V1 through the 3 with minimal ST segment depression in the same leads. ____________________________________________  LABS (pertinent positives/negatives)  CBC significant for hemoglobin 73.2 Metabolic panel significant for sodium 146, chloride 112, CO2 21, glucose 183, creatinine 1.6 Urinalysis positive for glucose but otherwise negative ____________________________________________  RADIOLOGY All Xrays were viewed by me. Imaging interpreted by Radiologist.  None __________________________________________  PROCEDURES  Procedure(s) performed: None Critical Care performed: None  ____________________________________________   ED COURSE / ASSESSMENT AND PLAN  CONSULTATIONS: None  Pertinent labs & imaging results that were available during my care of the patient were reviewed by me and considered in my medical decision making (see chart for details).   Episode of what sounds like vasovagal syncope in the setting of 90+ degrees outside. After 1 L normal saline her blood pressures now 202 systolic with heart rate of around 98. She had no chest pain or other high-risk symptoms to make me concerned about emergency cardiac or pulmonary etiology of syncope. Orthostatics after 1 L normal saline shows a slight jump in her heart rate, but 107,  and patient is able to manage hydration at home.  Patient / Family / Caregiver informed of clinical course, medical decision-making process, and agree with plan.   I discussed return precautions, follow-up instructions, and discharged instructions with patient and/or family.  ___________________________________________   FINAL CLINICAL IMPRESSION(S) / ED DIAGNOSES   Final diagnoses:  Syncope, unspecified syncope type    FOLLOW UP  Referred to: Primary care physician   Lisa Roca, MD 11/23/14 1842

## 2014-11-28 ENCOUNTER — Encounter: Payer: Self-pay | Admitting: Family Medicine

## 2014-11-28 ENCOUNTER — Ambulatory Visit (INDEPENDENT_AMBULATORY_CARE_PROVIDER_SITE_OTHER): Payer: Medicare Other | Admitting: Family Medicine

## 2014-11-28 VITALS — BP 132/81 | HR 109 | Temp 98.2°F | Wt 175.4 lb

## 2014-11-28 DIAGNOSIS — E1165 Type 2 diabetes mellitus with hyperglycemia: Secondary | ICD-10-CM | POA: Diagnosis not present

## 2014-11-28 DIAGNOSIS — E878 Other disorders of electrolyte and fluid balance, not elsewhere classified: Secondary | ICD-10-CM | POA: Diagnosis not present

## 2014-11-28 DIAGNOSIS — E1122 Type 2 diabetes mellitus with diabetic chronic kidney disease: Secondary | ICD-10-CM | POA: Insufficient documentation

## 2014-11-28 DIAGNOSIS — E119 Type 2 diabetes mellitus without complications: Secondary | ICD-10-CM | POA: Diagnosis not present

## 2014-11-28 LAB — BAYER DCA HB A1C WAIVED: HB A1C (BAYER DCA - WAIVED): 8.3 % — ABNORMAL HIGH (ref <7.0)

## 2014-11-28 NOTE — Assessment & Plan Note (Signed)
Not well controlled. A1c went from 6.8 to 8.3! Likely in part due to prednisone from COPD exacerbation earlier. She has been cheating with the sweets. Discussed increasing her medicine. She would like to hold at this time and work on diet. Coming back in 2 weeks for follow up with Malachy Mood. Test sugars TID. Keep a log. Really work on diet. Will review with PCP at follow up and they will determine at that time if she should have her medication increased.

## 2014-11-28 NOTE — Progress Notes (Signed)
BP 132/81 mmHg  Pulse 109  Temp(Src) 98.2 F (36.8 C)  Wt 175 lb 6.4 oz (79.561 kg)  SpO2 99%  LMP  (LMP Unknown)   Subjective:    Patient ID: Mary Parks, female    DOB: 1951-05-08, 64 y.o.   MRN: [id number]  HPI: Mary Parks is a 64 y.o. female  Chief Complaint  Patient presents with  . Hospitalization Follow-up    syncope, low blood pressure and dehydration   ER FOLLOW UP- had heatstroke and syncope. Had abnormal labs. Has been working on drinking water, but having trouble with it. Feeling much better, but still shakey. Thinks her sugars might be going low. Would like to have her A1c checked today.  Time since discharge: 5 days Hospital/facility: [facility] Diagnosis: Heat stroke/Vasovagal syncope Procedures/tests: EKG, labs, fluids Consultants: None New medications: None Discharge instructions: Follow up with PCP.   Status: better   DIABETES Hypoglycemic episodes:yes Polydipsia/polyuria: yes Visual disturbance: yes- blurry Chest pain: no Paresthesias: yes in her hands Glucose Monitoring: yes  Accucheck frequency: TID Taking Insulin?: yes  Long acting insulin:  Short acting insulin: Blood Pressure Monitoring: not checking Retinal Examination: Up to Date Foot Exam: Up to Date Diabetic Education: Completed Aspirin: yes   Relevant past medical, surgical, family and social history reviewed and updated as indicated. Interim medical history since our last visit reviewed. Allergies and medications reviewed and updated.  Review of Systems  Constitutional: Negative.   Respiratory: Negative.   Cardiovascular: Negative.   Gastrointestinal: Negative.   Neurological: Positive for dizziness, tremors, syncope and numbness. Negative for seizures.  Psychiatric/Behavioral: Negative.     Per HPI unless specifically indicated above     Objective:    BP 132/81 mmHg  Pulse 109  Temp(Src) 98.2 F (36.8 C)  Wt 175 lb 6.4 oz (79.561 kg)  SpO2 99%  LMP   (LMP Unknown)  Wt Readings from Last 3 Encounters:  11/28/14 175 lb 6.4 oz (79.561 kg)  11/23/14 175 lb (79.379 kg)  11/19/14 176 lb 9.6 oz (80.105 kg)    Physical Exam  Constitutional: She is oriented to person, place, and time. She appears well-developed and well-nourished. No distress.  HENT:  Head: Normocephalic and atraumatic.  Right Ear: Hearing normal.  Left Ear: Hearing normal.  Nose: Nose normal.  Eyes: Conjunctivae and lids are normal. Right eye exhibits no discharge. Left eye exhibits no discharge. No scleral icterus.  Cardiovascular: Regular rhythm and normal heart sounds.  Tachycardia present.   Pulmonary/Chest: Effort normal. No respiratory distress. She has no wheezes. She has no rales. She exhibits no tenderness.  Musculoskeletal: Normal range of motion. She exhibits edema.  Neurological: She is alert and oriented to person, place, and time.  Skin: Skin is warm, dry and intact. No rash noted. No erythema. No pallor.  Psychiatric: She has a normal mood and affect. Her speech is normal and behavior is normal. Judgment and thought content normal. Cognition and memory are normal.  Nursing note and vitals reviewed.   Results for orders placed or performed during the hospital encounter of 11/23/14  CBC  Result Value Ref Range   WBC 4.9 3.6 - 11.0 K/uL   RBC 4.35 3.80 - 5.20 MIL/uL   Hemoglobin 11.9 (L) 12.0 - 16.0 g/dL   HCT 37.2 35.0 - 47.0 %   MCV 85.4 80.0 - 100.0 fL   MCH 27.2 26.0 - 34.0 pg   MCHC 31.9 (L) 32.0 - 36.0 g/dL   RDW 15.7 (H)  11.5 - 14.5 %   Platelets 159 150 - 440 K/uL  Basic metabolic panel  Result Value Ref Range   Sodium 146 (H) 135 - 145 mmol/L   Potassium 3.7 3.5 - 5.1 mmol/L   Chloride 112 (H) 101 - 111 mmol/L   CO2 21 (L) 22 - 32 mmol/L   Glucose, Bld 183 (H) 65 - 99 mg/dL   BUN 18 6 - 20 mg/dL   Creatinine, Ser 1.60 (H) 0.44 - 1.00 mg/dL   Calcium 9.5 8.9 - 10.3 mg/dL   GFR calc non Af Amer 33 (L) >60 mL/min   GFR calc Af Amer 38 (L)  >60 mL/min   Anion gap 13 5 - 15  Troponin I  Result Value Ref Range   Troponin I <0.03 <0.031 ng/mL  Urinalysis complete, with microscopic  Result Value Ref Range   Color, Urine YELLOW (A) YELLOW   APPearance CLEAR (A) CLEAR   Glucose, UA >500 (A) NEGATIVE mg/dL   Bilirubin Urine NEGATIVE NEGATIVE   Ketones, ur NEGATIVE NEGATIVE mg/dL   Specific Gravity, Urine 1.026 1.005 - 1.030   Hgb urine dipstick NEGATIVE NEGATIVE   pH 5.0 5.0 - 8.0   Protein, ur NEGATIVE NEGATIVE mg/dL   Nitrite NEGATIVE NEGATIVE   Leukocytes, UA NEGATIVE NEGATIVE   RBC / HPF 0-5 0 - 5 RBC/hpf   WBC, UA 0-5 0 - 5 WBC/hpf   Bacteria, UA NONE SEEN NONE SEEN   Squamous Epithelial / LPF 0-5 (A) NONE SEEN      Assessment & Plan:   Problem List Items Addressed This Visit      Endocrine   Type 2 diabetes mellitus with hyperglycemia - Primary    Not well controlled. A1c went from 6.8 to 8.3! Likely in part due to prednisone from COPD exacerbation earlier. She has been cheating with the sweets. Discussed increasing her medicine. She would like to hold at this time and work on diet. Coming back in 2 weeks for follow up with Malachy Mood. Test sugars TID. Keep a log. Really work on diet. Will review with PCP at follow up and they will determine at that time if she should have her medication increased.       Relevant Medications   LANTUS SOLOSTAR 100 UNIT/ML Solostar Pen   Other Relevant Orders   Basic metabolic panel   Bayer DCA Hb A1c Waived    Other Visit Diagnoses    Electrolyte imbalance            Follow up plan: Return for As scheduled.

## 2014-11-28 NOTE — Patient Instructions (Addendum)
Heat Stress in the Elderly  Elderly people (people aged 66 years and older) are more prone to heat stress than younger people for several reasons:   Elderly people do not adjust as well as young people to sudden changes in temperature.  They are more likely to have a chronic medical condition that upsets normal body responses to heat.  They are more likely to take prescription medicines that impair the body's ability to regulate its temperature or that inhibit perspiration. HEAT STROKE  Heat stroke is the most serious heat-related illness. It occurs when the body becomes unable to control its temperature. The body temperature rises rapidly. Then the body loses its ability to sweat and is unable to cool down. The body temperature rises to 105 F (40.6 C) or higher within 10 to 15 minutes. Heat stroke can cause death or permanent disability if emergency treatment is not provided. SYMPTOMS  Warning signs vary but may include the following:  An extremely high body temperature (above 103 F (39.4 C)).  Nausea.  Red, hot, and dry skin (no sweating).  Rapid, strong pulse.  Throbbing headache.  Dizziness. HEAT EXHAUSTION  Heat exhaustion is a milder form of heat-related illness. It can develop after several days of exposure to high temperatures and not enough fluids. SYMPTOMS  Warning signs vary but may include the following:   Heavy sweating. Paleness.  Muscle cramps.  Tiredness. Weakness.  Dizziness.  Headache. Nausea or vomiting.  Fainting.  Skin: may be cool and moist.  Pulse rate: fast and weak.  Breathing: fast and shallow. WHAT YOU CAN DO TO PROTECT YOURSELF  You can follow these prevention tips to protect yourself from heat-related stress:   Drink cool, nonalcoholic, non-caffeinated beverages. If your caregiver generally limits the amount of fluid you drink or has you on water pills, ask how much you should drink when the weather is hot. Avoid extremely cold  liquids. They can cause cramps.  Rest.  Take a cool shower, bath, or sponge bath.  If possible, seek an air-conditioned environment. If you do not have air conditioning, visit an air-conditioned shopping mall or [place] to cool off.  Emergency planning/management officer.  If possible, remain indoors in the heat of the day.  Do not engage in strenuous activities. WHAT YOU CAN DO TO HELP PROTECT ELDERLY RELATIVES AND NEIGHBORS  If you have elderly relatives or neighbors, help them protect themselves from heat-related stress.   Visit older adults at risk at least twice a day. Watch them for signs of heat exhaustion or heat stroke.  Take them to air-conditioned locations if they have transportation problems.  Make sure older adults have access to an electric fan whenever possible. WHAT YOU CAN DO FOR SOMEONE WITH HEAT STRESS   If you see any signs of severe heat stress, you may be dealing with a life-threatening emergency. Have someone call for immediate medical assistance while you begin cooling the affected person. Do the following:  Get the person to a shady area.  Cool the person rapidly, using whatever methods you can. For example, immerse the person in a tub of cool water or place the person in a cool shower. Spray the person with cool water from a garden hose or sponge the person with cool water. If the humidity is low, wrap the person in a cool, wet sheet. Fan him/her quickly.  Monitor body temperature. Continue cooling efforts until the body temperature drops to 101 - 102F (38.3  C - 38.9  C).  If emergency medical personnel are delayed, call the hospital emergency room for further instructions.  Do not give the person alcohol to drink.  Get medical care as soon as possible. Document Released: 04/28/2009 Document Revised: 08/02/2011 Document Reviewed: 04/28/2009 [facility] Patient Information 2015 [place], [place]. This information is not intended to replace advice given to you  by your health care provider. Make sure you discuss any questions you have with your health care provider. Diabetes Mellitus and Food It is important for you to manage your blood sugar (glucose) level. Your blood glucose level can be greatly affected by what you eat. Eating healthier foods in the appropriate amounts throughout the day at about the same time each day will help you control your blood glucose level. It can also help slow or prevent worsening of your diabetes mellitus. Healthy eating may even help you improve the level of your blood pressure and reach or maintain a healthy weight.  HOW CAN FOOD AFFECT ME? Carbohydrates Carbohydrates affect your blood glucose level more than any other type of food. Your dietitian will help you determine how many carbohydrates to eat at each meal and teach you how to count carbohydrates. Counting carbohydrates is important to keep your blood glucose at a healthy level, especially if you are using insulin or taking certain medicines for diabetes mellitus. Alcohol Alcohol can cause sudden decreases in blood glucose (hypoglycemia), especially if you use insulin or take certain medicines for diabetes mellitus. Hypoglycemia can be a life-threatening condition. Symptoms of hypoglycemia (sleepiness, dizziness, and disorientation) are similar to symptoms of having too much alcohol.  If your health care provider has given you approval to drink alcohol, do so in moderation and use the following guidelines:  Women should not have more than one drink per day, and men should not have more than two drinks per day. One drink is equal to:  12 oz of beer.  5 oz of wine.  1 oz of hard liquor.  Do not drink on an empty stomach.  Keep yourself hydrated. Have water, diet soda, or unsweetened iced tea.  Regular soda, juice, and other mixers might contain a lot of carbohydrates and should be counted. WHAT FOODS ARE NOT RECOMMENDED? As you make food choices, it is  important to remember that all foods are not the same. Some foods have fewer nutrients per serving than other foods, even though they might have the same number of calories or carbohydrates. It is difficult to get your body what it needs when you eat foods with fewer nutrients. Examples of foods that you should avoid that are high in calories and carbohydrates but low in nutrients include:  Trans fats (most processed foods list trans fats on the Nutrition Facts label).  Regular soda.  Juice.  Candy.  Sweets, such as cake, pie, doughnuts, and cookies.  Fried foods. WHAT FOODS CAN I EAT? Have nutrient-rich foods, which will nourish your body and keep you healthy. The food you should eat also will depend on several factors, including:  The calories you need.  The medicines you take.  Your weight.  Your blood glucose level.  Your blood pressure level.  Your cholesterol level. You also should eat a variety of foods, including:  Protein, such as meat, poultry, fish, tofu, nuts, and seeds (lean animal proteins are best).  Fruits.  Vegetables.  Dairy products, such as milk, cheese, and yogurt (low fat is best).  Breads, grains, pasta, cereal, rice, and beans.  Fats such as  olive oil, trans fat-free margarine, canola oil, avocado, and olives. DOES EVERYONE WITH DIABETES MELLITUS HAVE THE SAME MEAL PLAN? Because every person with diabetes mellitus is different, there is not one meal plan that works for everyone. It is very important that you meet with a dietitian who will help you create a meal plan that is just right for you. Document Released: 02/04/2005 Document Revised: 05/15/2013 Document Reviewed: 04/06/2013 [facility] Patient Information 2015 [place], [place]. This information is not intended to replace advice given to you by your health care provider. Make sure you discuss any questions you have with your health care provider.

## 2014-11-29 ENCOUNTER — Telehealth: Payer: Self-pay | Admitting: Family Medicine

## 2014-11-29 LAB — BASIC METABOLIC PANEL
BUN / CREAT RATIO: 13 (ref 11–26)
BUN: 15 mg/dL (ref 8–27)
CALCIUM: 9.4 mg/dL (ref 8.7–10.3)
CHLORIDE: 104 mmol/L (ref 97–108)
CO2: 22 mmol/L (ref 18–29)
Creatinine, Ser: 1.14 mg/dL — ABNORMAL HIGH (ref 0.57–1.00)
GFR, EST AFRICAN AMERICAN: 58 mL/min/{1.73_m2} — AB (ref >59)
GFR, EST NON AFRICAN AMERICAN: 50 mL/min/{1.73_m2} — AB (ref >59)
Glucose: 260 mg/dL — ABNORMAL HIGH (ref 65–99)
Potassium: 4.1 mmol/L (ref 3.5–5.2)
Sodium: 144 mmol/L (ref 134–144)

## 2014-11-29 NOTE — Telephone Encounter (Signed)
Patient notified of results.

## 2014-11-29 NOTE — Telephone Encounter (Signed)
Please let her know that her kidney function is doing better, but she's still dehyrdated. Really push the fluids and we'll check it again when she comes in for her follow up with [facility].

## 2014-12-10 ENCOUNTER — Ambulatory Visit
Admission: EM | Admit: 2014-12-10 | Discharge: 2014-12-10 | Disposition: A | Discharge status: Home / Self Care | Payer: Medicare Other | Attending: Family Medicine | Admitting: Family Medicine

## 2014-12-10 DIAGNOSIS — Z7982 Long term (current) use of aspirin: Secondary | ICD-10-CM | POA: Insufficient documentation

## 2014-12-10 DIAGNOSIS — R42 Dizziness and giddiness: Secondary | ICD-10-CM | POA: Diagnosis present

## 2014-12-10 DIAGNOSIS — E669 Obesity, unspecified: Secondary | ICD-10-CM | POA: Diagnosis not present

## 2014-12-10 DIAGNOSIS — I10 Essential (primary) hypertension: Secondary | ICD-10-CM | POA: Insufficient documentation

## 2014-12-10 DIAGNOSIS — E119 Type 2 diabetes mellitus without complications: Secondary | ICD-10-CM | POA: Insufficient documentation

## 2014-12-10 DIAGNOSIS — E079 Disorder of thyroid, unspecified: Secondary | ICD-10-CM | POA: Insufficient documentation

## 2014-12-10 DIAGNOSIS — Z794 Long term (current) use of insulin: Secondary | ICD-10-CM | POA: Insufficient documentation

## 2014-12-10 DIAGNOSIS — J0100 Acute maxillary sinusitis, unspecified: Secondary | ICD-10-CM | POA: Insufficient documentation

## 2014-12-10 DIAGNOSIS — Z79899 Other long term (current) drug therapy: Secondary | ICD-10-CM | POA: Insufficient documentation

## 2014-12-10 DIAGNOSIS — K219 Gastro-esophageal reflux disease without esophagitis: Secondary | ICD-10-CM | POA: Insufficient documentation

## 2014-12-10 DIAGNOSIS — E785 Hyperlipidemia, unspecified: Secondary | ICD-10-CM | POA: Insufficient documentation

## 2014-12-10 HISTORY — DX: Disorder of kidney and ureter, unspecified: N28.9

## 2014-12-10 LAB — GLUCOSE, CAPILLARY: GLUCOSE-CAPILLARY: 91 mg/dL (ref 65–99)

## 2014-12-10 MED ORDER — AMOXICILLIN 875 MG PO TABS: 875.0000 mg | ORAL_TABLET | Freq: Two times a day (BID) | ORAL | Status: DC

## 2014-12-10 NOTE — ED Provider Notes (Signed)
CSN: [id number]     Arrival date & time 12/10/14  1104 History   First MD Initiated Contact with Patient 12/10/14 1140     Chief Complaint  Patient presents with  . Dizziness   (Consider location/radiation/quality/duration/timing/severity/associated sxs/prior Treatment) Patient is a 64 y.o. female presenting with dizziness and URI. The history is provided by the patient.  Dizziness Quality:  Lightheadedness Severity:  Mild Onset quality:  Sudden Timing:  Intermittent Chronicity:  Recurrent Associated symptoms: headaches (sinus)   Associated symptoms: no chest pain, no nausea, no palpitations, no shortness of breath, no syncope, no tinnitus, no vision changes and no vomiting   URI Presenting symptoms: congestion, facial pain and fatigue   Severity:  Moderate Onset quality:  Sudden Duration:  2 weeks Timing:  Constant Progression:  Worsening Chronicity:  New Associated symptoms: headaches (sinus) and sinus pain   Associated symptoms: no arthralgias, no neck pain, no sneezing, no swollen glands and no wheezing     Past Medical History  Diagnosis Date  . HTN (hypertension)   . Obesity   . DM2 (diabetes mellitus, type 2) 1987  . Hyperlipidemia   . GERD (gastroesophageal reflux disease)   . Osteoarthritis   . Chest pain   . Dyspnea     Cath normal coronaries. normal EF. RA 11. PA 37/17 (26) LVEDP 36  . Thyroid disease   . Renal disease    Past Surgical History  Procedure Laterality Date  . Cardiac catheterization     Family History  Problem Relation Age of Onset  . Diabetes Mother   . Diabetes Father   . Diabetes Sister   . Diabetes Brother   . Heart disease Neg Hx   . Coronary artery disease Neg Hx   . Diabetes Sister   . Diabetes Sister   . Diabetes Maternal Grandmother   . Diabetes Maternal Grandfather   . Diabetes Paternal Grandmother   . Diabetes Paternal Grandfather    History  Substance Use Topics  . Smoking status: Never Smoker   . Smokeless tobacco:  Never Used  . Alcohol Use: No   OB History    No data available     Review of Systems  Constitutional: Positive for fatigue.  HENT: Positive for congestion. Negative for sneezing and tinnitus.   Respiratory: Negative for shortness of breath and wheezing.   Cardiovascular: Negative for chest pain, palpitations and syncope.  Gastrointestinal: Negative for nausea and vomiting.  Musculoskeletal: Negative for arthralgias and neck pain.  Neurological: Positive for dizziness and headaches (sinus).    Allergies  Celebrex; Sulfa antibiotics; Sulfisoxazole; and Sulfur  Home Medications   Prior to Admission medications   Medication Sig Start Date End Date Taking? Authorizing Provider  amLODipine (NORVASC) 5 MG tablet Take 5 mg by mouth daily.   Yes Historical Provider, MD  aspirin 81 MG EC tablet Take 81 mg by mouth daily.     Yes Historical Provider, MD  atorvastatin (LIPITOR) 40 MG tablet Take 40 mg by mouth daily.   Yes Historical Provider, MD  canagliflozin (INVOKANA) 300 MG TABS tablet Take 300 mg by mouth daily before breakfast.   Yes Historical Provider, MD  cromolyn (OPTICROM) 4 % ophthalmic solution  11/20/14  Yes Historical Provider, MD  insulin lispro (HUMALOG KWIKPEN) 100 UNIT/ML KiwkPen Inject into the skin. 10 Units before breakfast, 10 Units before lunch and 20Units before evening meal   Yes Historical Provider, MD  LANTUS SOLOSTAR 100 UNIT/ML Solostar Pen 50 units at bedtime 11/21/14  Yes Historical Provider, MD  lisinopril (PRINIVIL,ZESTRIL) 20 MG tablet Take 20 mg by mouth daily.   Yes Historical Provider, MD  omeprazole (PRILOSEC) 20 MG capsule  09/25/14  Yes Historical Provider, MD  pregabalin (LYRICA) 100 MG capsule Take 100 mg by mouth 2 (two) times daily.   Yes Historical Provider, MD  simvastatin (ZOCOR) 40 MG tablet Take 40 mg by mouth at bedtime.     Yes Historical Provider, MD  amoxicillin (AMOXIL) 875 MG tablet Take 1 tablet (875 mg total) by mouth 2 (two) times daily.  12/10/14   Norval Gable, MD  benazepril (LOTENSIN) 40 MG tablet Take 40 mg by mouth daily.      Historical Provider, MD  budesonide-formoterol (SYMBICORT) 160-4.5 MCG/ACT inhaler Inhale 2 puffs into the lungs 2 (two) times daily.    Historical Provider, MD  Cholecalciferol (D 2000) 2000 UNITS TABS Take by mouth.    Historical Provider, MD  fluticasone (FLONASE) 50 MCG/ACT nasal spray Place 2 sprays into both nostrils daily.    Historical Provider, MD  traMADol (ULTRAM) 50 MG tablet Take 50-100 mg by mouth every 6 (six) hours as needed.    Historical Provider, MD   BP 137/75 mmHg  Pulse 97  Temp(Src) 98 F (36.7 C) (Oral)  Resp 17  Ht 5\' 4"  (1.626 m)  Wt 175 lb (79.379 kg)  BMI 30.02 kg/m2  SpO2 100%  LMP  (LMP Unknown) Physical Exam  Constitutional: She is oriented to person, place, and time. She appears well-developed and well-nourished. No distress.  HENT:  Head: Normocephalic and atraumatic.  Right Ear: Tympanic membrane, external ear and ear canal normal.  Left Ear: Tympanic membrane, external ear and ear canal normal.  Nose: Mucosal edema and rhinorrhea present. No nose lacerations, sinus tenderness, nasal deformity, septal deviation or nasal septal hematoma. No epistaxis.  No foreign bodies. Right sinus exhibits maxillary sinus tenderness and frontal sinus tenderness. Left sinus exhibits maxillary sinus tenderness and frontal sinus tenderness.  Mouth/Throat: Uvula is midline, oropharynx is clear and moist and mucous membranes are normal. No oropharyngeal exudate.  Eyes: Conjunctivae and EOM are normal. Pupils are equal, round, and reactive to light. Right eye exhibits no discharge. Left eye exhibits no discharge. No scleral icterus.  Neck: Normal range of motion. Neck supple. No thyromegaly present.  Cardiovascular: Normal rate, regular rhythm and normal heart sounds.   Pulmonary/Chest: Effort normal and breath sounds normal. No respiratory distress. She has no wheezes. She has no  rales.  Lymphadenopathy:    She has no cervical adenopathy.  Neurological: She is alert and oriented to person, place, and time. She displays normal reflexes. No cranial nerve deficit. She exhibits normal muscle tone. Coordination normal.  Skin: No rash noted. She is not diaphoretic.  Psychiatric: She has a normal mood and affect. Judgment and thought content normal.  Nursing note and vitals reviewed.   ED Course  Procedures (including critical care time) Labs Review Labs Reviewed  GLUCOSE, CAPILLARY    Imaging Review No results found.   MDM   1. Acute maxillary sinusitis, recurrence not specified    Discharge Medication List as of 12/10/2014 12:26 PM    START taking these medications   Details  amoxicillin (AMOXIL) 875 MG tablet Take 1 tablet (875 mg total) by mouth 2 (two) times daily., Starting 12/10/2014, Until Discontinued, Normal      Plan: 1. diagnosis reviewed with patient 2. rx as per orders; risks, benefits, potential side effects reviewed with patient 3. Recommend  supportive treatment with otc flonase  4. F/u prn if symptoms worsen or don't improve    Norval Gable, MD 12/10/14 1721

## 2014-12-10 NOTE — ED Notes (Signed)
States "fell out" on 11/23/14 and was taken to ER. Since then has had frontal headache. + dizziness when getting up from laying or seated postion.Also states "feels jittery". Hx of DM2 and Blood sugar was 230 mg/dL at 9am today

## 2014-12-17 ENCOUNTER — Encounter: Payer: Self-pay | Admitting: Unknown Physician Specialty

## 2014-12-17 ENCOUNTER — Ambulatory Visit (INDEPENDENT_AMBULATORY_CARE_PROVIDER_SITE_OTHER): Payer: Medicare Other | Admitting: Unknown Physician Specialty

## 2014-12-17 VITALS — BP 127/76 | HR 109 | Temp 98.1°F | Ht 64.5 in | Wt 180.0 lb

## 2014-12-17 DIAGNOSIS — E1122 Type 2 diabetes mellitus with diabetic chronic kidney disease: Secondary | ICD-10-CM

## 2014-12-17 DIAGNOSIS — N183 Chronic kidney disease, stage 3 unspecified: Secondary | ICD-10-CM

## 2014-12-17 DIAGNOSIS — I129 Hypertensive chronic kidney disease with stage 1 through stage 4 chronic kidney disease, or unspecified chronic kidney disease: Secondary | ICD-10-CM | POA: Diagnosis not present

## 2014-12-17 DIAGNOSIS — N189 Chronic kidney disease, unspecified: Secondary | ICD-10-CM

## 2014-12-17 DIAGNOSIS — N1831 Chronic kidney disease, stage 3a: Secondary | ICD-10-CM | POA: Insufficient documentation

## 2014-12-17 LAB — BAYER DCA HB A1C WAIVED: HB A1C (BAYER DCA - WAIVED): 8.4 % — ABNORMAL HIGH (ref <7.0)

## 2014-12-17 MED ORDER — AMLODIPINE BESYLATE 2.5 MG PO TABS: 2.5000 mg | ORAL_TABLET | Freq: Every day | ORAL | Status: DC

## 2014-12-17 NOTE — Progress Notes (Signed)
BP 127/76 mmHg  Pulse 109  Temp(Src) 98.1 F (36.7 C)  Ht 5' 4.5" (1.638 m)  Wt 180 lb (81.647 kg)  BMI 30.43 kg/m2  SpO2 98%  LMP  (LMP Unknown)   Subjective:    Patient ID: Mary Parks, female    DOB: [birth year], 64 y.o.   MRN: [id number]  HPI: Mary Parks is a 64 y.o. female  Chief Complaint  Patient presents with  . Diabetes  . Hypertension  . Hyperlipidemia   Diabetes Diabetes visit type: Regular f/u but was in the ER for a COPD exacerbation , dehydration and hypotension.  Hgb A1C found to be elevated to 8.3 on folow-up here with Dr. Wynetta Emery  She has type 2 diabetes mellitus. Disease course: doing well until "my spell" Pertinent negatives for diabetes include no chest pain. Diabetic complications include nephropathy. She is compliant with treatment all of the time (50 units Lantus at night.  Humalog 10 units Breakfast and lunch and 20 units at supper). Her weight is stable. She is following a diabetic diet. She monitors blood glucose at home 3-4 x per day. Her breakfast blood glucose range is generally 90-110 mg/dl. Her lunch blood glucose range is generally >200 mg/dl. Her dinner blood glucose range is generally >200 mg/dl. (Off her prednisone for 2-3 weeks and sugars are better)  Hypertension This is a chronic problem. The problem is unchanged. Pertinent negatives include no chest pain, palpitations, peripheral edema or shortness of breath. There are no compliance problems.   Hyperlipidemia This is a chronic problem. The problem is controlled. Exacerbating diseases include diabetes. Pertinent negatives include no chest pain or shortness of breath. Current antihyperlipidemic treatment includes statins.     Relevant past medical, surgical, family and social history reviewed and updated as indicated. Interim medical history since our last visit reviewed. Allergies and medications reviewed and updated.  Review of Systems  Respiratory: Negative for shortness  of breath.   Cardiovascular: Negative for chest pain and palpitations.    Per HPI unless specifically indicated above     Objective:    BP 127/76 mmHg  Pulse 109  Temp(Src) 98.1 F (36.7 C)  Ht 5' 4.5" (1.638 m)  Wt 180 lb (81.647 kg)  BMI 30.43 kg/m2  SpO2 98%  LMP  (LMP Unknown)  Wt Readings from Last 3 Encounters:  12/17/14 180 lb (81.647 kg)  12/10/14 175 lb (79.379 kg)  11/28/14 175 lb 6.4 oz (79.561 kg)    Physical Exam  Constitutional: She is oriented to person, place, and time. She appears well-developed and well-nourished. No distress.  HENT:  Head: Normocephalic and atraumatic.  Eyes: Conjunctivae and lids are normal. Right eye exhibits no discharge. Left eye exhibits no discharge. No scleral icterus.  Cardiovascular: Normal rate and regular rhythm.   Pulmonary/Chest: Effort normal and breath sounds normal. No respiratory distress.  Abdominal: Normal appearance. There is no splenomegaly or hepatomegaly.  Musculoskeletal: Normal range of motion.  Neurological: She is alert and oriented to person, place, and time.  Skin: Skin is intact. No rash noted. No pallor.  Psychiatric: She has a normal mood and affect. Her behavior is normal. Judgment and thought content normal.    Results for orders placed or performed during the hospital encounter of 12/10/14  Glucose, capillary  Result Value Ref Range   Glucose-Capillary 91 65 - 99 mg/dL      Assessment & Plan:   Problem List Items Addressed This Visit      Unprioritized  Diabetes mellitus with chronic kidney disease - Primary    Pt was on Prednisone for a month.  Expect Hgb A1C to be high but will check today.  Do not make any changes at this time.        Relevant Orders   Comprehensive metabolic panel   Bayer DCA Hb A1c Waived   Chronic kidney disease, stage 3    Sees Nephrology.  Will check CMP here as recent problems with dizzyness and COPD      Hypertension associated with chronic kidney disease due  to type 2 diabetes mellitus    Periods of hypotension.  Will decrease Amlodipine to 2.5 mg.        Relevant Medications   amLODipine (NORVASC) 2.5 MG tablet       Follow up plan: Return in about 3 months (around 03/19/2015).

## 2014-12-17 NOTE — Assessment & Plan Note (Signed)
Sees Nephrology.  Will check CMP here as recent problems with dizzyness and COPD

## 2014-12-17 NOTE — Assessment & Plan Note (Signed)
Pt was on Prednisone for a month.  Expect Hgb A1C to be high but will check today.  Do not make any changes at this time.

## 2014-12-17 NOTE — Assessment & Plan Note (Signed)
Periods of hypotension.  Will decrease Amlodipine to 2.5 mg.

## 2014-12-18 ENCOUNTER — Encounter: Payer: Self-pay | Admitting: Unknown Physician Specialty

## 2014-12-18 LAB — COMPREHENSIVE METABOLIC PANEL
A/G RATIO: 1.7 (ref 1.1–2.5)
ALBUMIN: 4.3 g/dL (ref 3.6–4.8)
ALK PHOS: 78 IU/L (ref 39–117)
ALT: 15 IU/L (ref 0–32)
AST: 12 IU/L (ref 0–40)
BILIRUBIN TOTAL: 0.4 mg/dL (ref 0.0–1.2)
BUN/Creatinine Ratio: 13 (ref 11–26)
BUN: 15 mg/dL (ref 8–27)
CO2: 22 mmol/L (ref 18–29)
Calcium: 10.1 mg/dL (ref 8.7–10.3)
Chloride: 102 mmol/L (ref 97–108)
Creatinine, Ser: 1.19 mg/dL — ABNORMAL HIGH (ref 0.57–1.00)
GFR calc non Af Amer: 48 mL/min/{1.73_m2} — ABNORMAL LOW (ref >59)
GFR, EST AFRICAN AMERICAN: 55 mL/min/{1.73_m2} — AB (ref >59)
Globulin, Total: 2.5 g/dL (ref 1.5–4.5)
Glucose: 169 mg/dL — ABNORMAL HIGH (ref 65–99)
Potassium: 4.3 mmol/L (ref 3.5–5.2)
Sodium: 143 mmol/L (ref 134–144)
Total Protein: 6.8 g/dL (ref 6.0–8.5)

## 2014-12-23 LAB — HM DIABETES EYE EXAM

## 2015-01-15 ENCOUNTER — Emergency Department: Payer: Medicare Other

## 2015-01-15 ENCOUNTER — Encounter: Payer: Self-pay | Admitting: Emergency Medicine

## 2015-01-15 ENCOUNTER — Emergency Department
Admission: EM | Admit: 2015-01-15 | Discharge: 2015-01-15 | Disposition: A | Discharge status: Home / Self Care | Payer: Medicare Other | Attending: Emergency Medicine | Admitting: Emergency Medicine

## 2015-01-15 ENCOUNTER — Other Ambulatory Visit: Payer: Self-pay

## 2015-01-15 DIAGNOSIS — J441 Chronic obstructive pulmonary disease with (acute) exacerbation: Secondary | ICD-10-CM | POA: Insufficient documentation

## 2015-01-15 DIAGNOSIS — Z792 Long term (current) use of antibiotics: Secondary | ICD-10-CM | POA: Diagnosis not present

## 2015-01-15 DIAGNOSIS — I129 Hypertensive chronic kidney disease with stage 1 through stage 4 chronic kidney disease, or unspecified chronic kidney disease: Secondary | ICD-10-CM | POA: Diagnosis not present

## 2015-01-15 DIAGNOSIS — Z791 Long term (current) use of non-steroidal anti-inflammatories (NSAID): Secondary | ICD-10-CM | POA: Diagnosis not present

## 2015-01-15 DIAGNOSIS — M2740 Unspecified cyst of jaw: Secondary | ICD-10-CM | POA: Diagnosis not present

## 2015-01-15 DIAGNOSIS — Z7951 Long term (current) use of inhaled steroids: Secondary | ICD-10-CM | POA: Insufficient documentation

## 2015-01-15 DIAGNOSIS — N183 Chronic kidney disease, stage 3 (moderate): Secondary | ICD-10-CM | POA: Insufficient documentation

## 2015-01-15 DIAGNOSIS — E1122 Type 2 diabetes mellitus with diabetic chronic kidney disease: Secondary | ICD-10-CM | POA: Insufficient documentation

## 2015-01-15 DIAGNOSIS — Z79811 Long term (current) use of aromatase inhibitors: Secondary | ICD-10-CM | POA: Insufficient documentation

## 2015-01-15 DIAGNOSIS — Z794 Long term (current) use of insulin: Secondary | ICD-10-CM | POA: Insufficient documentation

## 2015-01-15 DIAGNOSIS — Z7982 Long term (current) use of aspirin: Secondary | ICD-10-CM | POA: Diagnosis not present

## 2015-01-15 DIAGNOSIS — E785 Hyperlipidemia, unspecified: Secondary | ICD-10-CM | POA: Diagnosis not present

## 2015-01-15 DIAGNOSIS — Z79899 Other long term (current) drug therapy: Secondary | ICD-10-CM | POA: Insufficient documentation

## 2015-01-15 DIAGNOSIS — E039 Hypothyroidism, unspecified: Secondary | ICD-10-CM | POA: Insufficient documentation

## 2015-01-15 DIAGNOSIS — J341 Cyst and mucocele of nose and nasal sinus: Secondary | ICD-10-CM | POA: Diagnosis not present

## 2015-01-15 DIAGNOSIS — R42 Dizziness and giddiness: Secondary | ICD-10-CM | POA: Diagnosis not present

## 2015-01-15 DIAGNOSIS — J3489 Other specified disorders of nose and nasal sinuses: Secondary | ICD-10-CM | POA: Diagnosis not present

## 2015-01-15 LAB — GLUCOSE, CAPILLARY: Glucose-Capillary: 144 mg/dL — ABNORMAL HIGH (ref 65–99)

## 2015-01-15 MED ORDER — MECLIZINE HCL 25 MG PO TABS: 25.0000 mg | ORAL_TABLET | Freq: Three times a day (TID) | ORAL | Status: DC | PRN

## 2015-01-15 NOTE — ED Notes (Signed)
Patient transported to CT 

## 2015-01-15 NOTE — ED Notes (Signed)
States she developed ear discomfort and sinus pressure since June. Sx's has been intermittent   But this am feels dizzy. Denies any pain or n/v

## 2015-01-15 NOTE — ED Notes (Signed)
Reports cough and sinus congestion and feeling dizzy for 3 wks, .  Reports taking antibiotic but not better.  Skin w/d, no resp distress

## 2015-01-15 NOTE — ED Provider Notes (Signed)
[facility] Emergency Department Provider Note  ____________________________________________  Time seen: Approximately 12:58 PM  I have reviewed the triage vital signs and the nursing notes.   HISTORY  Chief Complaint Dizziness and Nasal Congestion    HPI Mary Parks is a 64 y.o. female patient complain of sinus congestion and cough increased vertigo for the past 3 weeks. Patient states she was diagnosed with a sinus infection a month ago and placed on amoxicillin finished a course of therapy and noticed no improvement. Patient is states she becomes very dizzy coming from a sitting to standing position. Patient denies any nausea associated with this vertigo. Patient denies any pain associated with this complaint.   Past Medical History  Diagnosis Date  . HTN (hypertension)   . Obesity   . DM2 (diabetes mellitus, type 2) 1987  . Hyperlipidemia   . GERD (gastroesophageal reflux disease)   . Osteoarthritis   . Chest pain   . Dyspnea     Cath normal coronaries. normal EF. RA 11. PA 37/17 (26) LVEDP 36  . Thyroid disease   . Renal disease     Patient Active Problem List   Diagnosis Date Noted  . Chronic kidney disease, stage 3 12/17/2014  . Hypertension associated with chronic kidney disease due to type 2 diabetes mellitus 12/17/2014  . Diabetes mellitus with chronic kidney disease 11/28/2014  . COPD (chronic obstructive pulmonary disease) 11/19/2014  . Allergic conjunctivitis 11/19/2014  . COPD exacerbation 11/05/2014  . UNSPECIFIED ANEMIA 09/25/2008  . DYSPNEA 09/13/2008  . SNORING 09/13/2008    Past Surgical History  Procedure Laterality Date  . Cardiac catheterization      Current Outpatient Rx  Name  Route  Sig  Dispense  Refill  . amLODipine (NORVASC) 2.5 MG tablet   Oral   Take 1 tablet (2.5 mg total) by mouth daily.   90 tablet   3   . amoxicillin (AMOXIL) 875 MG tablet   Oral   Take 1 tablet (875 mg total) by mouth 2  (two) times daily.   20 tablet   0   . aspirin 81 MG EC tablet   Oral   Take 81 mg by mouth daily.           Marland Kitchen atorvastatin (LIPITOR) 40 MG tablet   Oral   Take 40 mg by mouth daily.         . budesonide-formoterol (SYMBICORT) 160-4.5 MCG/ACT inhaler   Inhalation   Inhale 2 puffs into the lungs 2 (two) times daily.         . canagliflozin (INVOKANA) 300 MG TABS tablet   Oral   Take 300 mg by mouth daily before breakfast.         . Cholecalciferol (D 2000) 2000 UNITS TABS   Oral   Take by mouth.         . cromolyn (OPTICROM) 4 % ophthalmic solution               . fluticasone (FLONASE) 50 MCG/ACT nasal spray   Each Nare   Place 2 sprays into both nostrils daily.         . insulin lispro (HUMALOG KWIKPEN) 100 UNIT/ML KiwkPen   Subcutaneous   Inject into the skin. 10 Units before breakfast, 10 Units before lunch and 20Units before evening meal         . LANTUS SOLOSTAR 100 UNIT/ML Solostar Pen      50 units at bedtime  Dispense as written.   Marland Kitchen lisinopril (PRINIVIL,ZESTRIL) 20 MG tablet   Oral   Take 20 mg by mouth daily.         . meclizine (ANTIVERT) 25 MG tablet   Oral   Take 1 tablet (25 mg total) by mouth 3 (three) times daily as needed for dizziness.   30 tablet   0   . omeprazole (PRILOSEC) 20 MG capsule               . pregabalin (LYRICA) 100 MG capsule   Oral   Take 100 mg by mouth 2 (two) times daily.         . simvastatin (ZOCOR) 40 MG tablet   Oral   Take 40 mg by mouth at bedtime.           . traMADol (ULTRAM) 50 MG tablet   Oral   Take 50-100 mg by mouth every 6 (six) hours as needed.           Allergies Celebrex; Sulfa antibiotics; Sulfisoxazole; and Sulfur  Family History  Problem Relation Age of Onset  . Diabetes Mother   . Diabetes Father   . Diabetes Sister   . Diabetes Brother   . Heart disease Neg Hx   . Coronary artery disease Neg Hx   . Diabetes Sister   . Diabetes Sister   .  Diabetes Maternal Grandmother   . Diabetes Maternal Grandfather   . Diabetes Paternal Grandmother   . Diabetes Paternal Grandfather     Social History Social History  Substance Use Topics  . Smoking status: Never Smoker   . Smokeless tobacco: Never Used  . Alcohol Use: No    Review of Systems Constitutional: No fever/chills Eyes: No visual changes. ENT: No sore throat. Cardiovascular: Denies chest pain. Respiratory: Denies shortness of breath. Gastrointestinal: No abdominal pain.  No nausea, no vomiting.  No diarrhea.  No constipation. Genitourinary: Negative for dysuria. Musculoskeletal: Negative for back pain. Skin: Negative for rash. Neurological: Positive for frontal headache but denies focal weakness or numbness. Endocrine:Diabetes,hypertension, hypothyroidism, and hyperlipidemia. Hematological/Lymphatic: Allergic/Immunilogical: See medication list. 10-point ROS otherwise negative.  ____________________________________________   PHYSICAL EXAM:  VITAL SIGNS: ED Triage Vitals  Enc Vitals Group     BP --      Pulse --      Resp --      Temp --      Temp src --      SpO2 --      Weight --      Height --      Head Cir --      Peak Flow --      Pain Score --      Pain Loc --      Pain Edu? --      Excl. in [place]? --     Constitutional: Alert and oriented. Well appearing and in no acute distress. Eyes: Conjunctivae are normal. PERRL. EOMI. Head: Atraumatic. Nose: No congestion/rhinnorhea. Mouth/Throat: Mucous membranes are moist.  Oropharynx non-erythematous. Neck: No stridor.  No cervical spine tenderness to palpation. Hematological/Lymphatic/Immunilogical: No cervical lymphadenopathy. Cardiovascular: Normal rate, regular rhythm. Grossly normal heart sounds.  Good peripheral circulation. Respiratory: Normal respiratory effort.  No retractions. Lungs CTAB. Gastrointestinal: Soft and nontender. No distention. No abdominal bruits. No CVA  tenderness. Musculoskeletal: No lower extremity tenderness nor edema.  No joint effusions. Neurologic:  Normal speech and language. No gross focal neurologic deficits are appreciated. No gait instability. Skin:  Skin is warm, dry and intact. No rash noted. Psychiatric: Mood and affect are normal. Speech and behavior are normal.  ____________________________________________   LABS (all labs ordered are listed, but only abnormal results are displayed)  Labs Reviewed  GLUCOSE, CAPILLARY - Abnormal; Notable for the following:    Glucose-Capillary 144 (*)    All other components within normal limits   ____________________________________________  EKG  Tachycardic. ____________________________________________  RADIOLOGY  CT scan was remarkable for maxillary sinuses. ____________________________________________   PROCEDURES  Procedure(s) performed: None  Critical Care performed: No  ____________________________________________   INITIAL IMPRESSION / ASSESSMENT AND PLAN / ED COURSE  Pertinent labs & imaging results that were available during my care of the patient were reviewed by me and considered in my medical decision making (see chart for details).  Sinusitis and vertigo. Patient was tilt negative for orthostatics. Discuss CT findings with patient will follow-up family doctor. Patient get a prescription for Antivert 25 mg take every 8 hours when necessary vertigo. Return by ER condition worsens. ____________________________________________   FINAL CLINICAL IMPRESSION(S) / ED DIAGNOSES  Final diagnoses:  Maxillary sinus cyst  Vertigo      Sable Feil, PA-C 01/15/15 1432  Lisa Roca, MD 01/15/15 [phone]

## 2015-01-15 NOTE — Discharge Instructions (Signed)
Take medication as directed.

## 2015-01-21 DIAGNOSIS — E1165 Type 2 diabetes mellitus with hyperglycemia: Secondary | ICD-10-CM | POA: Diagnosis not present

## 2015-01-23 DIAGNOSIS — E041 Nontoxic single thyroid nodule: Secondary | ICD-10-CM | POA: Diagnosis not present

## 2015-01-23 DIAGNOSIS — R0981 Nasal congestion: Secondary | ICD-10-CM | POA: Diagnosis not present

## 2015-01-23 DIAGNOSIS — R0683 Snoring: Secondary | ICD-10-CM | POA: Diagnosis not present

## 2015-01-23 DIAGNOSIS — J301 Allergic rhinitis due to pollen: Secondary | ICD-10-CM | POA: Diagnosis not present

## 2015-01-23 DIAGNOSIS — J329 Chronic sinusitis, unspecified: Secondary | ICD-10-CM | POA: Diagnosis not present

## 2015-01-31 DIAGNOSIS — J301 Allergic rhinitis due to pollen: Secondary | ICD-10-CM | POA: Diagnosis not present

## 2015-02-12 DIAGNOSIS — E041 Nontoxic single thyroid nodule: Secondary | ICD-10-CM | POA: Diagnosis not present

## 2015-02-12 DIAGNOSIS — J301 Allergic rhinitis due to pollen: Secondary | ICD-10-CM | POA: Diagnosis not present

## 2015-02-13 DIAGNOSIS — J301 Allergic rhinitis due to pollen: Secondary | ICD-10-CM | POA: Diagnosis not present

## 2015-02-18 DIAGNOSIS — H2513 Age-related nuclear cataract, bilateral: Secondary | ICD-10-CM | POA: Diagnosis not present

## 2015-02-20 ENCOUNTER — Other Ambulatory Visit: Payer: Self-pay | Admitting: Internal Medicine

## 2015-02-20 DIAGNOSIS — E049 Nontoxic goiter, unspecified: Secondary | ICD-10-CM

## 2015-02-24 ENCOUNTER — Ambulatory Visit
Admission: RE | Admit: 2015-02-24 | Discharge: 2015-02-24 | Disposition: A | Discharge status: Home / Self Care | Payer: Medicare Other | Source: Ambulatory Visit | Attending: Internal Medicine | Admitting: Internal Medicine

## 2015-02-24 DIAGNOSIS — E041 Nontoxic single thyroid nodule: Secondary | ICD-10-CM | POA: Diagnosis not present

## 2015-02-24 DIAGNOSIS — E049 Nontoxic goiter, unspecified: Secondary | ICD-10-CM

## 2015-02-24 DIAGNOSIS — E042 Nontoxic multinodular goiter: Secondary | ICD-10-CM | POA: Insufficient documentation

## 2015-03-06 DIAGNOSIS — E041 Nontoxic single thyroid nodule: Secondary | ICD-10-CM | POA: Diagnosis not present

## 2015-03-07 DIAGNOSIS — E049 Nontoxic goiter, unspecified: Secondary | ICD-10-CM | POA: Diagnosis not present

## 2015-03-13 DIAGNOSIS — E041 Nontoxic single thyroid nodule: Secondary | ICD-10-CM | POA: Diagnosis not present

## 2015-03-18 ENCOUNTER — Ambulatory Visit: Payer: Medicare Other | Admitting: Unknown Physician Specialty

## 2015-03-19 DIAGNOSIS — H25013 Cortical age-related cataract, bilateral: Secondary | ICD-10-CM | POA: Diagnosis not present

## 2015-03-24 ENCOUNTER — Encounter: Payer: Self-pay | Admitting: *Deleted

## 2015-03-25 ENCOUNTER — Ambulatory Visit (INDEPENDENT_AMBULATORY_CARE_PROVIDER_SITE_OTHER): Payer: Medicare Other | Admitting: Unknown Physician Specialty

## 2015-03-25 ENCOUNTER — Encounter: Payer: Self-pay | Admitting: Unknown Physician Specialty

## 2015-03-25 VITALS — BP 152/78 | HR 90 | Temp 98.4°F | Ht 63.5 in | Wt 185.2 lb

## 2015-03-25 DIAGNOSIS — Z794 Long term (current) use of insulin: Secondary | ICD-10-CM | POA: Diagnosis not present

## 2015-03-25 DIAGNOSIS — N183 Chronic kidney disease, stage 3 unspecified: Secondary | ICD-10-CM

## 2015-03-25 DIAGNOSIS — E785 Hyperlipidemia, unspecified: Secondary | ICD-10-CM | POA: Diagnosis not present

## 2015-03-25 DIAGNOSIS — E1122 Type 2 diabetes mellitus with diabetic chronic kidney disease: Secondary | ICD-10-CM

## 2015-03-25 DIAGNOSIS — I129 Hypertensive chronic kidney disease with stage 1 through stage 4 chronic kidney disease, or unspecified chronic kidney disease: Secondary | ICD-10-CM

## 2015-03-25 DIAGNOSIS — Z01818 Encounter for other preprocedural examination: Secondary | ICD-10-CM | POA: Diagnosis not present

## 2015-03-25 DIAGNOSIS — N189 Chronic kidney disease, unspecified: Secondary | ICD-10-CM

## 2015-03-25 DIAGNOSIS — N182 Chronic kidney disease, stage 2 (mild): Secondary | ICD-10-CM | POA: Diagnosis not present

## 2015-03-25 DIAGNOSIS — Z23 Encounter for immunization: Secondary | ICD-10-CM

## 2015-03-25 LAB — LIPID PANEL PICCOLO, WAIVED
CHOL/HDL RATIO PICCOLO,WAIVE: 3.3 mg/dL
CHOLESTEROL PICCOLO, WAIVED: 165 mg/dL (ref <200)
HDL CHOL PICCOLO, WAIVED: 50 mg/dL — AB (ref >59)
LDL CHOL CALC PICCOLO WAIVED: 98 mg/dL (ref <100)
TRIGLYCERIDES PICCOLO,WAIVED: 84 mg/dL (ref <150)
VLDL CHOL CALC PICCOLO,WAIVE: 17 mg/dL (ref <30)

## 2015-03-25 LAB — BAYER DCA HB A1C WAIVED: HB A1C (BAYER DCA - WAIVED): 7.4 % — ABNORMAL HIGH (ref <7.0)

## 2015-03-25 NOTE — Assessment & Plan Note (Signed)
Not to goal.  Increase Amlodipine to 5 mg.

## 2015-03-25 NOTE — Assessment & Plan Note (Deleted)
Down to 7.4 from 8.5 previous.  Doing better

## 2015-03-25 NOTE — Progress Notes (Signed)
BP 152/78 mmHg  Pulse 90  Temp(Src) 98.4 F (36.9 C)  Ht 5' 3.5" (1.613 m)  Wt 185 lb 3.2 oz (84.006 kg)  BMI 32.29 kg/m2  SpO2 98%  LMP  (LMP Unknown)   Subjective:    Patient ID: Mary Parks, female    DOB: 1950-12-19, 64 y.o.   MRN: [id number]  HPI: Mary Parks is a 64 y.o. female  Chief Complaint  Patient presents with  . Diabetes  . Hyperlipidemia  . Hypertension  . other    pt states Dr. Kennon Portela, who is doing thyroid surgery, needs a letter from [facility] stating that patient is able to go under anesthesia   Diabetes:  Using medications without difficulties Hypoglycemic episodes: none Hyperglycemic episodes:none Feet problems: none Blood Sugars averaging: 112 in the AM eye exam within last year:yes 50 units Lantus at night.  10 Humalog in the AM and 20 u at supper  Hypertension: a little high today Using medications without difficulty Average home BPs "around 150's"  Using medication without problems or lightheadedness No chest pain with exertion or shortness of breath Edema: none Average home BPs: none Other issues:none  Elevated Cholesterol Using medications without problems: Muscle aches: none Diet compliance: good Exercise: normal Other complaints:none    Relevant past medical, surgical, family and social history reviewed and updated as indicated. Interim medical history since our last visit reviewed. Allergies and medications reviewed and updated.  Due for thyroid surgery.  Needs pre-op clearance.    Review of Systems  Constitutional: Negative.   HENT: Negative.        Sinus drainage  Eyes: Negative.   Respiratory: Negative.   Cardiovascular: Negative.   Gastrointestinal: Negative.   Endocrine: Negative.   Genitourinary: Negative.   Musculoskeletal: Negative.   Skin: Negative.   Allergic/Immunologic: Negative.   Neurological: Negative.   Hematological: Negative.   Psychiatric/Behavioral: Negative.     Per HPI unless  specifically indicated above     Objective:    BP 152/78 mmHg  Pulse 90  Temp(Src) 98.4 F (36.9 C)  Ht 5' 3.5" (1.613 m)  Wt 185 lb 3.2 oz (84.006 kg)  BMI 32.29 kg/m2  SpO2 98%  LMP  (LMP Unknown)  Wt Readings from Last 3 Encounters:  03/25/15 185 lb 3.2 oz (84.006 kg)  03/24/15 180 lb (81.647 kg)  01/15/15 180 lb (81.647 kg)    Physical Exam  Constitutional: She is oriented to person, place, and time. She appears well-developed and well-nourished. No distress.  HENT:  Head: Normocephalic and atraumatic.  Eyes: Conjunctivae and lids are normal. Right eye exhibits no discharge. Left eye exhibits no discharge. No scleral icterus.  Cardiovascular: Normal rate and regular rhythm.   Pulmonary/Chest: Effort normal and breath sounds normal. No respiratory distress.  Abdominal: Normal appearance. There is no splenomegaly or hepatomegaly.  Musculoskeletal: Normal range of motion.  Neurological: She is alert and oriented to person, place, and time.  Skin: Skin is intact. No rash noted. No pallor.  Psychiatric: She has a normal mood and affect. Her behavior is normal. Judgment and thought content normal.   EKG is normal    Assessment & Plan:   Problem List Items Addressed This Visit      Unprioritized   Diabetes mellitus with chronic kidney disease ([place])    Down to 7.4 from 8.5 previous.  Not to goal but improved      Relevant Orders   Bayer DCA Hb A1c Waived (Completed)   Comprehensive  metabolic panel   Chronic kidney disease, stage 3   Relevant Orders   Comprehensive metabolic panel   Hypertension associated with chronic kidney disease due to type 2 diabetes mellitus ([place])    Not to goal.  Increase Amlodipine to 5 mg.         Other Visit Diagnoses    Immunization due    -  Primary    Relevant Orders    Flu Vaccine QUAD 36+ mos PF IM (Fluarix & Fluzone Quad PF) (Completed)    Pneumococcal polysaccharide vaccine 23-valent greater than or equal to 2yo subcutaneous/IM  (Completed)    Hyperlipidemia        Relevant Orders    Lipid Panel Pic[place], Waived (Completed)    Pre-op evaluation        Relevant Orders    EKG 12-Lead (Completed)        Follow up plan: Return in about 2 weeks (around 04/08/2015) for blood pressure.  Recheck BP in 1-2 weeks.  If WNL.  OK for surgery.

## 2015-03-25 NOTE — Assessment & Plan Note (Signed)
Down to 7.4 from 8.5 previous.  Not to goal but improved

## 2015-03-26 LAB — COMPREHENSIVE METABOLIC PANEL
ALBUMIN: 4.1 g/dL (ref 3.6–4.8)
ALK PHOS: 81 IU/L (ref 39–117)
ALT: 10 IU/L (ref 0–32)
AST: 11 IU/L (ref 0–40)
Albumin/Globulin Ratio: 1.6 (ref 1.1–2.5)
BILIRUBIN TOTAL: 0.5 mg/dL (ref 0.0–1.2)
BUN / CREAT RATIO: 17 (ref 11–26)
BUN: 15 mg/dL (ref 8–27)
CHLORIDE: 104 mmol/L (ref 97–106)
CO2: 23 mmol/L (ref 18–29)
Calcium: 9.5 mg/dL (ref 8.7–10.3)
Creatinine, Ser: 0.87 mg/dL (ref 0.57–1.00)
GFR calc non Af Amer: 70 mL/min/{1.73_m2} (ref >59)
GFR, EST AFRICAN AMERICAN: 80 mL/min/{1.73_m2} (ref >59)
GLOBULIN, TOTAL: 2.6 g/dL (ref 1.5–4.5)
GLUCOSE: 127 mg/dL — AB (ref 65–99)
Potassium: 4.1 mmol/L (ref 3.5–5.2)
SODIUM: 144 mmol/L (ref 136–144)
TOTAL PROTEIN: 6.7 g/dL (ref 6.0–8.5)

## 2015-03-28 NOTE — Discharge Instructions (Signed)

## 2015-03-31 ENCOUNTER — Ambulatory Visit: Payer: Medicare Other | Admitting: Anesthesiology

## 2015-03-31 ENCOUNTER — Encounter
Admission: RE | Disposition: A | Discharge status: Home / Self Care | Payer: Self-pay | Source: Ambulatory Visit | Attending: Ophthalmology

## 2015-03-31 ENCOUNTER — Ambulatory Visit
Admission: RE | Admit: 2015-03-31 | Discharge: 2015-03-31 | Disposition: A | Discharge status: Home / Self Care | Payer: Medicare Other | Source: Ambulatory Visit | Attending: Ophthalmology | Admitting: Ophthalmology

## 2015-03-31 DIAGNOSIS — Z79899 Other long term (current) drug therapy: Secondary | ICD-10-CM | POA: Diagnosis not present

## 2015-03-31 DIAGNOSIS — Z9889 Other specified postprocedural states: Secondary | ICD-10-CM | POA: Diagnosis not present

## 2015-03-31 DIAGNOSIS — M1990 Unspecified osteoarthritis, unspecified site: Secondary | ICD-10-CM | POA: Insufficient documentation

## 2015-03-31 DIAGNOSIS — H25013 Cortical age-related cataract, bilateral: Secondary | ICD-10-CM | POA: Diagnosis not present

## 2015-03-31 DIAGNOSIS — Z888 Allergy status to other drugs, medicaments and biological substances status: Secondary | ICD-10-CM | POA: Diagnosis not present

## 2015-03-31 DIAGNOSIS — Z794 Long term (current) use of insulin: Secondary | ICD-10-CM | POA: Diagnosis not present

## 2015-03-31 DIAGNOSIS — Z7951 Long term (current) use of inhaled steroids: Secondary | ICD-10-CM | POA: Diagnosis not present

## 2015-03-31 DIAGNOSIS — I10 Essential (primary) hypertension: Secondary | ICD-10-CM | POA: Insufficient documentation

## 2015-03-31 DIAGNOSIS — E079 Disorder of thyroid, unspecified: Secondary | ICD-10-CM | POA: Diagnosis not present

## 2015-03-31 DIAGNOSIS — E119 Type 2 diabetes mellitus without complications: Secondary | ICD-10-CM | POA: Diagnosis not present

## 2015-03-31 DIAGNOSIS — Z7982 Long term (current) use of aspirin: Secondary | ICD-10-CM | POA: Insufficient documentation

## 2015-03-31 DIAGNOSIS — Z882 Allergy status to sulfonamides status: Secondary | ICD-10-CM | POA: Diagnosis not present

## 2015-03-31 DIAGNOSIS — T7840XA Allergy, unspecified, initial encounter: Secondary | ICD-10-CM | POA: Diagnosis not present

## 2015-03-31 DIAGNOSIS — H25011 Cortical age-related cataract, right eye: Secondary | ICD-10-CM | POA: Diagnosis not present

## 2015-03-31 DIAGNOSIS — M810 Age-related osteoporosis without current pathological fracture: Secondary | ICD-10-CM | POA: Diagnosis not present

## 2015-03-31 DIAGNOSIS — H269 Unspecified cataract: Secondary | ICD-10-CM | POA: Diagnosis present

## 2015-03-31 HISTORY — PX: CATARACT EXTRACTION W/PHACO: SHX586

## 2015-03-31 HISTORY — DX: Dizziness and giddiness: R42

## 2015-03-31 HISTORY — DX: Unspecified asthma, uncomplicated: J45.909

## 2015-03-31 LAB — GLUCOSE, CAPILLARY
GLUCOSE-CAPILLARY: 150 mg/dL — AB (ref 65–99)
Glucose-Capillary: 167 mg/dL — ABNORMAL HIGH (ref 65–99)

## 2015-03-31 SURGERY — PHACOEMULSIFICATION, CATARACT, WITH IOL INSERTION
Anesthesia: Monitor Anesthesia Care | Laterality: Right | Wound class: Clean

## 2015-03-31 MED ORDER — ARMC OPHTHALMIC DILATING GEL
1.0000 "application " | OPHTHALMIC | Status: DC | PRN
  Administered 2015-03-31 (×2): 1 via OPHTHALMIC

## 2015-03-31 MED ORDER — MIDAZOLAM HCL 2 MG/2ML IJ SOLN
INTRAMUSCULAR | Status: DC | PRN
  Administered 2015-03-31: 2 mg via INTRAVENOUS

## 2015-03-31 MED ORDER — BRIMONIDINE TARTRATE 0.2 % OP SOLN
OPHTHALMIC | Status: DC | PRN
  Administered 2015-03-31: 1 [drp] via OPHTHALMIC

## 2015-03-31 MED ORDER — EPINEPHRINE HCL 1 MG/ML IJ SOLN
INTRAOCULAR | Status: DC | PRN
  Administered 2015-03-31: 95 mL via OPHTHALMIC

## 2015-03-31 MED ORDER — LIDOCAINE HCL (PF) 4 % IJ SOLN
INTRAOCULAR | Status: DC | PRN
  Administered 2015-03-31: 1 mL via OPHTHALMIC

## 2015-03-31 MED ORDER — NA HYALUR & NA CHOND-NA HYALUR 0.4-0.35 ML IO KIT
PACK | INTRAOCULAR | Status: DC | PRN
  Administered 2015-03-31: 1 mL via INTRAOCULAR

## 2015-03-31 MED ORDER — TETRACAINE HCL 0.5 % OP SOLN
1.0000 [drp] | OPHTHALMIC | Status: DC | PRN
  Administered 2015-03-31: 1 [drp] via OPHTHALMIC

## 2015-03-31 MED ORDER — CEFUROXIME OPHTHALMIC INJECTION 1 MG/0.1 ML
INJECTION | OPHTHALMIC | Status: DC | PRN
  Administered 2015-03-31: 0.1 mL via INTRACAMERAL

## 2015-03-31 MED ORDER — TIMOLOL MALEATE 0.5 % OP SOLN
OPHTHALMIC | Status: DC | PRN
Start: 2015-03-31 — End: 2015-03-31
  Administered 2015-03-31: 1 [drp]

## 2015-03-31 MED ORDER — FENTANYL CITRATE (PF) 100 MCG/2ML IJ SOLN
INTRAMUSCULAR | Status: DC | PRN
Start: 2015-03-31 — End: 2015-03-31
  Administered 2015-03-31: 50 ug via INTRAVENOUS

## 2015-03-31 MED ORDER — POVIDONE-IODINE 5 % OP SOLN
1.0000 "application " | OPHTHALMIC | Status: DC | PRN
  Administered 2015-03-31: 1 via OPHTHALMIC

## 2015-03-31 MED ORDER — LACTATED RINGERS IV SOLN: INTRAVENOUS | Status: DC

## 2015-03-31 SURGICAL SUPPLY — 29 items
APPLICATOR COTTON TIP 3IN (MISCELLANEOUS) ×3 IMPLANT
CANNULA ANT/CHMB 27GA (MISCELLANEOUS) ×3 IMPLANT
DISSECTOR HYDRO NUCLEUS 50X22 (MISCELLANEOUS) ×3 IMPLANT
GLOVE BIO SURGEON STRL SZ7 (GLOVE) ×3 IMPLANT
GLOVE SURG LX 6.5 MICRO (GLOVE) ×2
GLOVE SURG LX STRL 6.5 MICRO (GLOVE) ×1 IMPLANT
GOWN STRL REUS W/ TWL LRG LVL3 (GOWN DISPOSABLE) ×2 IMPLANT
GOWN STRL REUS W/TWL LRG LVL3 (GOWN DISPOSABLE) ×4
LENS IOL ACRSF IQ PC 21.0 (Intraocular Lens) ×1 IMPLANT
LENS IOL ACRYSOF IQ POST 21.0 (Intraocular Lens) ×3 IMPLANT
MARKER SKIN SURG W/RULER VIO (MISCELLANEOUS) ×3 IMPLANT
NEEDLE FILTER BLUNT 18X 1/2SAF (NEEDLE) ×2
NEEDLE FILTER BLUNT 18X1 1/2 (NEEDLE) ×1 IMPLANT
PACK CATARACT BRASINGTON (MISCELLANEOUS) ×3 IMPLANT
PACK EYE AFTER SURG (MISCELLANEOUS) ×3 IMPLANT
PACK OPTHALMIC (MISCELLANEOUS) ×3 IMPLANT
RING MALYGIN 7.0 (MISCELLANEOUS) IMPLANT
SOL BAL SALT 15ML (MISCELLANEOUS)
SOLUTION BAL SALT 15ML (MISCELLANEOUS) IMPLANT
SUT ETHILON 10-0 CS-B-6CS-B-6 (SUTURE)
SUT VICRYL  9 0 (SUTURE)
SUT VICRYL 9 0 (SUTURE) IMPLANT
SUTURE EHLN 10-0 CS-B-6CS-B-6 (SUTURE) IMPLANT
SYR 3ML LL SCALE MARK (SYRINGE) ×3 IMPLANT
SYR TB 1ML LUER SLIP (SYRINGE) ×3 IMPLANT
WATER STERILE IRR 250ML POUR (IV SOLUTION) ×3 IMPLANT
WATER STERILE IRR 500ML POUR (IV SOLUTION) IMPLANT
WICK EYE OCUCEL (MISCELLANEOUS) IMPLANT
WIPE NON LINTING 3.25X3.25 (MISCELLANEOUS) ×3 IMPLANT

## 2015-03-31 NOTE — Transfer of Care (Signed)
Immediate Anesthesia Transfer of Care Note  Patient: Mary Parks  Procedure(s) Performed: Procedure(s) with comments: CATARACT EXTRACTION PHACO AND INTRAOCULAR LENS PLACEMENT (IOC) (Right) - DIABETIC - insulin  Patient Location: PACU  Anesthesia Type: MAC  Level of Consciousness: awake, alert  and patient cooperative  Airway and Oxygen Therapy: Patient Spontanous Breathing and Patient connected to supplemental oxygen  Post-op Assessment: Post-op Vital signs reviewed, Patient's Cardiovascular Status Stable, Respiratory Function Stable, Patent Airway and No signs of Nausea or vomiting  Post-op Vital Signs: Reviewed and stable  Complications: No apparent anesthesia complications

## 2015-03-31 NOTE — H&P (Signed)
H+P reviewed and is up to date, please see paper chart.  

## 2015-03-31 NOTE — Op Note (Signed)
Date of Surgery: 03/31/2015  PREOPERATIVE DIAGNOSES: Visually significant cortical cataract, right eye.  POSTOPERATIVE DIAGNOSES: Same  PROCEDURES PERFORMED: Cataract extraction with intraocular lens implant, right eye.  SURGEON: Almon Hercules, M.D.  ANESTHESIA: MAC and topical  IMPLANTS: AcrySof IQ [id number] +21.0 D   Implant Name Type Inv. Item Serial No. Manufacturer Lot No. LRB No. Used  IMPLANT LENS - [id number] Intraocular Lens IMPLANT LENS [id number] ALCON   Right 1     COMPLICATIONS: None.  DESCRIPTION OF PROCEDURE: Therapeutic options were discussed with the patient preoperatively, including a discussion of risks and benefits of surgery. Informed consent was obtained. An IOL-Master and immersion biometry were used to take the lens measurements, and a dilated fundus exam was performed within 6 months of the surgical date.  The patient was premedicated and brought to the operating room and placed on the operating table in the supine position. After adequate anesthesia, the patient was prepped and draped in the usual sterile ophthalmic fashion. A wire lid speculum was inserted and the microscope was positioned. A Superblade was used to create a paracentesis site at the limbus and a small amount of dilute preservative free lidocaine was instilled into the anterior chamber, followed by dispersive viscoelastic. A clear corneal incision was created temporally using a 2.4 mm keratome blade. Capsulorrhexis was then performed. In situ phacoemulsification was performed.  Cortical material was removed with the irrigation-aspiration unit. Dispersive viscoelastic was instilled to open the capsular bag. A posterior chamber intraocular lens with the specifications above was inserted and positioned. Irrigation-aspiration was used to remove all viscoelastic. Cefuroxime 1cc was instilled into the anterior chamber, and the corneal incision was checked and found to be water tight. The eyelid speculum was  removed.  The operative eye was covered with protective goggles after instilling 1 drop of timolol and brimonidine. The patient tolerated the procedure well. There were no complications.

## 2015-03-31 NOTE — Anesthesia Procedure Notes (Signed)
Procedure Name: MAC Performed by: Holt Woolbright Pre-anesthesia Checklist: Patient identified, Emergency Drugs available, Suction available, Timeout performed and Patient being monitored Patient Re-evaluated:Patient Re-evaluated prior to inductionOxygen Delivery Method: Nasal cannula Placement Confirmation: positive ETCO2     

## 2015-03-31 NOTE — Anesthesia Postprocedure Evaluation (Signed)
  Anesthesia Post-op Note  Patient: Mary Parks  Procedure(s) Performed: Procedure(s) with comments: CATARACT EXTRACTION PHACO AND INTRAOCULAR LENS PLACEMENT (IOC) (Right) - DIABETIC - insulin  Anesthesia type:MAC  Patient location: PACU  Post pain: Pain level controlled  Post assessment: Post-op Vital signs reviewed, Patient's Cardiovascular Status Stable, Respiratory Function Stable, Patent Airway and No signs of Nausea or vomiting  Post vital signs: Reviewed and stable  Last Vitals:  Filed Vitals:   03/31/15 0900  BP: 135/67  Pulse: 74  Temp:   Resp: 15    Level of consciousness: awake, alert  and patient cooperative  Complications: No apparent anesthesia complications

## 2015-03-31 NOTE — Anesthesia Preprocedure Evaluation (Signed)
Anesthesia Evaluation  Patient identified by MRN, date of birth, ID band Patient awake    Airway Mallampati: II  TM Distance: >3 FB Neck ROM: Full    Dental   Pulmonary COPD,           Cardiovascular hypertension, Pt. on medications      Neuro/Psych    GI/Hepatic   Endo/Other  diabetes, Well Controlled, Type 2  Renal/GU      Musculoskeletal   Abdominal   Peds  Hematology   Anesthesia Other Findings   Reproductive/Obstetrics                             Anesthesia Physical Anesthesia Plan  ASA: III  Anesthesia Plan: MAC   Post-op Pain Management:    Induction: Intravenous  Airway Management Planned:   Additional Equipment:   Intra-op Plan:   Post-operative Plan:   Informed Consent: I have reviewed the patients History and Physical, chart, labs and discussed the procedure including the risks, benefits and alternatives for the proposed anesthesia with the patient or authorized representative who has indicated his/her understanding and acceptance.     Plan Discussed with: CRNA  Anesthesia Plan Comments:         Anesthesia Quick Evaluation

## 2015-04-01 ENCOUNTER — Encounter: Payer: Self-pay | Admitting: Ophthalmology

## 2015-04-01 ENCOUNTER — Other Ambulatory Visit: Payer: Medicare Other

## 2015-04-04 ENCOUNTER — Ambulatory Visit (INDEPENDENT_AMBULATORY_CARE_PROVIDER_SITE_OTHER): Payer: Medicare Other | Admitting: Unknown Physician Specialty

## 2015-04-04 ENCOUNTER — Telehealth: Payer: Self-pay

## 2015-04-04 ENCOUNTER — Encounter: Payer: Self-pay | Admitting: Unknown Physician Specialty

## 2015-04-04 VITALS — BP 135/83 | HR 102 | Temp 97.6°F | Ht 64.9 in | Wt 184.8 lb

## 2015-04-04 DIAGNOSIS — R42 Dizziness and giddiness: Secondary | ICD-10-CM | POA: Insufficient documentation

## 2015-04-04 DIAGNOSIS — E1122 Type 2 diabetes mellitus with diabetic chronic kidney disease: Secondary | ICD-10-CM

## 2015-04-04 DIAGNOSIS — I129 Hypertensive chronic kidney disease with stage 1 through stage 4 chronic kidney disease, or unspecified chronic kidney disease: Secondary | ICD-10-CM

## 2015-04-04 DIAGNOSIS — N189 Chronic kidney disease, unspecified: Secondary | ICD-10-CM

## 2015-04-04 MED ORDER — MECLIZINE HCL 25 MG PO TABS: 25.0000 mg | ORAL_TABLET | Freq: Three times a day (TID) | ORAL | Status: DC | PRN

## 2015-04-04 NOTE — Progress Notes (Signed)
   BP 135/83 mmHg  Pulse 102  Temp(Src) 97.6 F (36.4 C)  Ht 5' 4.9" (1.648 m)  Wt 184 lb 12.8 oz (83.825 kg)  BMI 30.86 kg/m2  SpO2 97%  LMP  (LMP Unknown)   Subjective:    Patient ID: Mary Parks, female    DOB: 1950-10-03, 64 y.o.   MRN: [id number]  HPI: Mary Parks is a 64 y.o. female  Chief Complaint  Patient presents with  . Hypertension    pt states she is here for 2 week follow-up for BP   Hypertension Here for f/u BP  We increased her Amlodipine from 2.5 mg to 5 mg.  She is doing well.  No SOB or chest pain.  No edema noted on higher dose.   She is dizzy with a history of vertigo that "jumps on me" about once a month and lasts for about 1-2 days.  She would like a refill of Meclizine  Relevant past medical, surgical, family and social history reviewed and updated as indicated. Interim medical history since our last visit reviewed. Allergies and medications reviewed and updated.  Review of Systems  Per HPI unless specifically indicated above     Objective:    BP 135/83 mmHg  Pulse 102  Temp(Src) 97.6 F (36.4 C)  Ht 5' 4.9" (1.648 m)  Wt 184 lb 12.8 oz (83.825 kg)  BMI 30.86 kg/m2  SpO2 97%  LMP  (LMP Unknown)  Wt Readings from Last 3 Encounters:  04/04/15 184 lb 12.8 oz (83.825 kg)  03/31/15 183 lb (83.008 kg)  03/25/15 185 lb 3.2 oz (84.006 kg)    Physical Exam  Constitutional: She is oriented to person, place, and time. She appears well-developed and well-nourished. No distress.  HENT:  Head: Normocephalic and atraumatic.  Eyes: Conjunctivae and lids are normal. Right eye exhibits no discharge. Left eye exhibits no discharge. No scleral icterus.  Cardiovascular: Normal rate.   Pulmonary/Chest: Effort normal. No respiratory distress.  Abdominal: Normal appearance. There is no splenomegaly or hepatomegaly.  Musculoskeletal: Normal range of motion.  Neurological: She is alert and oriented to person, place, and time.  Skin: Skin is intact. No  rash noted. No pallor.  Psychiatric: She has a normal mood and affect. Her behavior is normal. Judgment and thought content normal.    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       Vertigo, intermittent    Refill Meclizine 25 mg prn        OK for surgery   Follow up plan: Return in about 3 months (around 07/05/2015).

## 2015-04-04 NOTE — Assessment & Plan Note (Signed)
Refill Meclizine 25 mg prn

## 2015-04-04 NOTE — Assessment & Plan Note (Addendum)
Stable

## 2015-04-04 NOTE — Telephone Encounter (Signed)
Faxed meclizine prescription to pharmacy because it was printed out.

## 2015-04-08 ENCOUNTER — Encounter
Admission: RE | Admit: 2015-04-08 | Discharge: 2015-04-08 | Disposition: A | Discharge status: Home / Self Care | Payer: Medicare Other | Source: Ambulatory Visit | Attending: Otolaryngology | Admitting: Otolaryngology

## 2015-04-08 DIAGNOSIS — E041 Nontoxic single thyroid nodule: Secondary | ICD-10-CM | POA: Diagnosis not present

## 2015-04-08 DIAGNOSIS — Z01818 Encounter for other preprocedural examination: Secondary | ICD-10-CM | POA: Insufficient documentation

## 2015-04-08 NOTE — Patient Instructions (Addendum)
  Your procedure is scheduled on: 04/14/15 Mon  Report to Day Surgery.2nd floor medical mall To find out your arrival time please call [phone] between 1PM - 3PM on Fri 04/11/15  Remember: Instructions that are not followed completely may result in serious medical risk, up to and including death, or upon the discretion of your surgeon and anesthesiologist your surgery may need to be rescheduled.    __x__ 1. Do not eat food or drink liquids after midnight. No gum chewing or hard candies.     ____ 2. No Alcohol for 24 hours before or after surgery.   ____ 3. Bring all medications with you on the day of surgery if instructed.    __x__ 4. Notify your doctor if there is any change in your medical condition     (cold, fever, infections).     Do not wear jewelry, make-up, hairpins, clips or nail polish.  Do not wear lotions, powders, or perfumes. You may wear deodorant.  Do not shave 48 hours prior to surgery. Men may shave face and neck.  Do not bring valuables to the hospital.    [facility] is not responsible for any belongings or valuables.               Contacts, dentures or bridgework may not be worn into surgery.  Leave your suitcase in the car. After surgery it may be brought to your room.  For patients admitted to the hospital, discharge time is determined by your                treatment team.   Patients discharged the day of surgery will not be allowed to drive home.   Please read over the following fact sheets that you were given:      _x___ Take these medicines the morning of surgery with A SIP OF WATER:    1amLODipine (NORVASC) 2.5 MG tablet  2. atorvastatin (LIPITOR) 40 MG tablet  3. budesonide-formoterol (SYMBICORT) 160-4.5 MCG/ACT inhaler  4.lisinopril (PRINIVIL,ZESTRIL) 20 MG tablet  5.montelukast (SINGULAIR) 10 MG tablet  6.omeprazole (PRILOSEC) 20 MG capsule  ____ Fleet Enema (as directed)   _x___ Use CHG Soap as directed  _x___ Use inhalers on the day of  surgery  ____ Stop metformin 2 days prior to surgery    x__ Take 1/2 of usual insulin dose the night before surgery and none on the morning of surgery.   _x___ Stop Coumadin/Plavix/aspirin on Stop aspirin today  _x___ Stop Anti-inflammatories may take tylenol as needed    ____ Stop supplements until after surgery.    ____ Bring C-Pap to the hospital.

## 2015-04-11 ENCOUNTER — Other Ambulatory Visit: Payer: Self-pay | Admitting: Family Medicine

## 2015-04-11 ENCOUNTER — Other Ambulatory Visit: Payer: Self-pay | Admitting: Unknown Physician Specialty

## 2015-04-14 ENCOUNTER — Encounter: Payer: Self-pay | Admitting: *Deleted

## 2015-04-14 ENCOUNTER — Ambulatory Visit: Payer: Medicare Other | Admitting: Anesthesiology

## 2015-04-14 ENCOUNTER — Encounter
Admission: RE | Disposition: A | Discharge status: Home / Self Care | Payer: Self-pay | Source: Ambulatory Visit | Attending: Otolaryngology

## 2015-04-14 ENCOUNTER — Observation Stay
Admission: RE | Admit: 2015-04-14 | Discharge: 2015-04-15 | Disposition: A | Discharge status: Home / Self Care | Payer: Medicare Other | Source: Ambulatory Visit | Attending: Otolaryngology | Admitting: Otolaryngology

## 2015-04-14 DIAGNOSIS — J309 Allergic rhinitis, unspecified: Secondary | ICD-10-CM | POA: Insufficient documentation

## 2015-04-14 DIAGNOSIS — Z79899 Other long term (current) drug therapy: Secondary | ICD-10-CM | POA: Insufficient documentation

## 2015-04-14 DIAGNOSIS — M1990 Unspecified osteoarthritis, unspecified site: Secondary | ICD-10-CM | POA: Diagnosis not present

## 2015-04-14 DIAGNOSIS — K219 Gastro-esophageal reflux disease without esophagitis: Secondary | ICD-10-CM | POA: Diagnosis not present

## 2015-04-14 DIAGNOSIS — Z833 Family history of diabetes mellitus: Secondary | ICD-10-CM | POA: Insufficient documentation

## 2015-04-14 DIAGNOSIS — J449 Chronic obstructive pulmonary disease, unspecified: Secondary | ICD-10-CM | POA: Diagnosis not present

## 2015-04-14 DIAGNOSIS — Z888 Allergy status to other drugs, medicaments and biological substances status: Secondary | ICD-10-CM | POA: Insufficient documentation

## 2015-04-14 DIAGNOSIS — I10 Essential (primary) hypertension: Secondary | ICD-10-CM | POA: Diagnosis not present

## 2015-04-14 DIAGNOSIS — Z6831 Body mass index (BMI) 31.0-31.9, adult: Secondary | ICD-10-CM | POA: Diagnosis not present

## 2015-04-14 DIAGNOSIS — E669 Obesity, unspecified: Secondary | ICD-10-CM | POA: Insufficient documentation

## 2015-04-14 DIAGNOSIS — Z882 Allergy status to sulfonamides status: Secondary | ICD-10-CM | POA: Diagnosis not present

## 2015-04-14 DIAGNOSIS — E042 Nontoxic multinodular goiter: Principal | ICD-10-CM | POA: Insufficient documentation

## 2015-04-14 DIAGNOSIS — E041 Nontoxic single thyroid nodule: Secondary | ICD-10-CM | POA: Diagnosis not present

## 2015-04-14 DIAGNOSIS — Z9889 Other specified postprocedural states: Secondary | ICD-10-CM

## 2015-04-14 DIAGNOSIS — R0602 Shortness of breath: Secondary | ICD-10-CM | POA: Diagnosis not present

## 2015-04-14 DIAGNOSIS — Z794 Long term (current) use of insulin: Secondary | ICD-10-CM | POA: Insufficient documentation

## 2015-04-14 DIAGNOSIS — E890 Postprocedural hypothyroidism: Secondary | ICD-10-CM

## 2015-04-14 DIAGNOSIS — E119 Type 2 diabetes mellitus without complications: Secondary | ICD-10-CM | POA: Insufficient documentation

## 2015-04-14 DIAGNOSIS — N289 Disorder of kidney and ureter, unspecified: Secondary | ICD-10-CM | POA: Insufficient documentation

## 2015-04-14 HISTORY — PX: THYROIDECTOMY: SHX17

## 2015-04-14 LAB — GLUCOSE, CAPILLARY
GLUCOSE-CAPILLARY: 159 mg/dL — AB (ref 65–99)
GLUCOSE-CAPILLARY: 274 mg/dL — AB (ref 65–99)
Glucose-Capillary: 167 mg/dL — ABNORMAL HIGH (ref 65–99)
Glucose-Capillary: 168 mg/dL — ABNORMAL HIGH (ref 65–99)
Glucose-Capillary: 237 mg/dL — ABNORMAL HIGH (ref 65–99)

## 2015-04-14 SURGERY — THYROIDECTOMY
Anesthesia: General | Site: Neck | Wound class: Clean Contaminated

## 2015-04-14 MED ORDER — DEXTROSE-NACL 5-0.45 % IV SOLN
INTRAVENOUS | Status: DC
  Administered 2015-04-14: 13:00:00 via INTRAVENOUS

## 2015-04-14 MED ORDER — ONDANSETRON HCL 4 MG/2ML IJ SOLN
INTRAMUSCULAR | Status: DC | PRN
  Administered 2015-04-14: 4 mg via INTRAVENOUS

## 2015-04-14 MED ORDER — BISACODYL 5 MG PO TBEC: 5.0000 mg | DELAYED_RELEASE_TABLET | Freq: Every day | ORAL | Status: DC | PRN

## 2015-04-14 MED ORDER — ONDANSETRON 4 MG PO TBDP: 4.0000 mg | ORAL_TABLET | Freq: Four times a day (QID) | ORAL | Status: DC | PRN

## 2015-04-14 MED ORDER — PROPOFOL 10 MG/ML IV BOLUS
INTRAVENOUS | Status: DC | PRN
  Administered 2015-04-14: 150 mg via INTRAVENOUS

## 2015-04-14 MED ORDER — INSULIN GLARGINE 100 UNIT/ML ~~LOC~~ SOLN
50.0000 [IU] | Freq: Every day | SUBCUTANEOUS | Status: DC
  Administered 2015-04-14: 50 [IU] via SUBCUTANEOUS
  Filled 2015-04-14 (×2): qty 0.5

## 2015-04-14 MED ORDER — CANAGLIFLOZIN 300 MG PO TABS
300.0000 mg | ORAL_TABLET | Freq: Every day | ORAL | Status: DC
  Administered 2015-04-15: 300 mg via ORAL

## 2015-04-14 MED ORDER — LISINOPRIL 20 MG PO TABS
20.0000 mg | ORAL_TABLET | Freq: Every day | ORAL | Status: DC
  Administered 2015-04-14 – 2015-04-15 (×2): 20 mg via ORAL
  Filled 2015-04-14 (×2): qty 1

## 2015-04-14 MED ORDER — PREGABALIN 75 MG PO CAPS
100.0000 mg | ORAL_CAPSULE | Freq: Two times a day (BID) | ORAL | Status: DC
  Administered 2015-04-15: 100 mg via ORAL
  Filled 2015-04-14: qty 1

## 2015-04-14 MED ORDER — NEOSTIGMINE METHYLSULFATE 10 MG/10ML IV SOLN
INTRAVENOUS | Status: DC | PRN
  Administered 2015-04-14: 3 mg via INTRAVENOUS

## 2015-04-14 MED ORDER — LIDOCAINE-EPINEPHRINE (PF) 1 %-1:200000 IJ SOLN
INTRAMUSCULAR | Status: DC | PRN
  Administered 2015-04-14: 4 mL

## 2015-04-14 MED ORDER — FENTANYL CITRATE (PF) 100 MCG/2ML IJ SOLN
INTRAMUSCULAR | Status: DC | PRN
  Administered 2015-04-14 (×3): 50 ug via INTRAVENOUS

## 2015-04-14 MED ORDER — PANTOPRAZOLE SODIUM 40 MG PO TBEC
40.0000 mg | DELAYED_RELEASE_TABLET | Freq: Every day | ORAL | Status: DC
  Administered 2015-04-15: 40 mg via ORAL
  Filled 2015-04-14: qty 1

## 2015-04-14 MED ORDER — MAGNESIUM HYDROXIDE 400 MG/5ML PO SUSP: 30.0000 mL | Freq: Every day | ORAL | Status: DC | PRN

## 2015-04-14 MED ORDER — LIDOCAINE-EPINEPHRINE (PF) 1 %-1:200000 IJ SOLN
INTRAMUSCULAR | Status: AC
  Filled 2015-04-14: qty 30

## 2015-04-14 MED ORDER — INSULIN LISPRO 100 UNIT/ML ~~LOC~~ SOLN: 20.0000 [IU] | Freq: Three times a day (TID) | SUBCUTANEOUS | Status: DC

## 2015-04-14 MED ORDER — ATORVASTATIN CALCIUM 20 MG PO TABS
40.0000 mg | ORAL_TABLET | Freq: Every day | ORAL | Status: DC
  Administered 2015-04-15: 40 mg via ORAL
  Filled 2015-04-14: qty 2

## 2015-04-14 MED ORDER — HYDROMORPHONE HCL 1 MG/ML IJ SOLN
INTRAMUSCULAR | Status: AC
  Administered 2015-04-14: 0.25 mg via INTRAVENOUS
  Filled 2015-04-14: qty 1

## 2015-04-14 MED ORDER — INSULIN ASPART 100 UNIT/ML ~~LOC~~ SOLN
20.0000 [IU] | Freq: Three times a day (TID) | SUBCUTANEOUS | Status: DC
  Administered 2015-04-14 – 2015-04-15 (×3): 20 [IU] via SUBCUTANEOUS
  Filled 2015-04-14 (×3): qty 20

## 2015-04-14 MED ORDER — ONDANSETRON HCL 4 MG/2ML IJ SOLN: 4.0000 mg | Freq: Four times a day (QID) | INTRAMUSCULAR | Status: DC | PRN

## 2015-04-14 MED ORDER — CALCIUM CARBONATE-VITAMIN D 500-200 MG-UNIT PO TABS
1.0000 | ORAL_TABLET | Freq: Three times a day (TID) | ORAL | Status: DC
  Administered 2015-04-14 – 2015-04-15 (×4): 1 via ORAL
  Filled 2015-04-14 (×4): qty 1

## 2015-04-14 MED ORDER — ESMOLOL HCL 100 MG/10ML IV SOLN
INTRAVENOUS | Status: DC | PRN
  Administered 2015-04-14: 10 mg via INTRAVENOUS

## 2015-04-14 MED ORDER — SODIUM CHLORIDE 0.45 % IV SOLN
INTRAVENOUS | Status: DC
  Administered 2015-04-14: 21:00:00 via INTRAVENOUS

## 2015-04-14 MED ORDER — BACITRACIN ZINC 500 UNIT/GM EX OINT
TOPICAL_OINTMENT | CUTANEOUS | Status: AC
  Filled 2015-04-14: qty 28.35

## 2015-04-14 MED ORDER — TRAMADOL HCL 50 MG PO TABS: 50.0000 mg | ORAL_TABLET | Freq: Four times a day (QID) | ORAL | Status: DC | PRN

## 2015-04-14 MED ORDER — MIDAZOLAM HCL 2 MG/2ML IJ SOLN
INTRAMUSCULAR | Status: DC | PRN
  Administered 2015-04-14 (×2): 1 mg via INTRAVENOUS

## 2015-04-14 MED ORDER — HYDROCODONE-ACETAMINOPHEN 5-325 MG PO TABS: 1.0000 | ORAL_TABLET | ORAL | Status: DC | PRN

## 2015-04-14 MED ORDER — ACETAMINOPHEN 325 MG PO TABS
650.0000 mg | ORAL_TABLET | Freq: Four times a day (QID) | ORAL | Status: DC | PRN
  Administered 2015-04-14: 650 mg via ORAL
  Filled 2015-04-14: qty 2

## 2015-04-14 MED ORDER — BUDESONIDE-FORMOTEROL FUMARATE 160-4.5 MCG/ACT IN AERO
2.0000 | INHALATION_SPRAY | Freq: Two times a day (BID) | RESPIRATORY_TRACT | Status: DC
  Administered 2015-04-14 – 2015-04-15 (×2): 2 via RESPIRATORY_TRACT
  Filled 2015-04-14: qty 6

## 2015-04-14 MED ORDER — ACETAMINOPHEN 650 MG RE SUPP: 650.0000 mg | Freq: Four times a day (QID) | RECTAL | Status: DC | PRN

## 2015-04-14 MED ORDER — SODIUM CHLORIDE 0.9 % IV SOLN
INTRAVENOUS | Status: DC
  Administered 2015-04-14: 07:00:00 via INTRAVENOUS

## 2015-04-14 MED ORDER — HYDROMORPHONE HCL 1 MG/ML IJ SOLN
0.2500 mg | INTRAMUSCULAR | Status: DC | PRN
  Administered 2015-04-14 (×3): 0.25 mg via INTRAVENOUS

## 2015-04-14 MED ORDER — LIDOCAINE HCL (CARDIAC) 20 MG/ML IV SOLN
INTRAVENOUS | Status: DC | PRN
  Administered 2015-04-14: 30 mg via INTRAVENOUS

## 2015-04-14 MED ORDER — GLYCOPYRROLATE 0.2 MG/ML IJ SOLN
INTRAMUSCULAR | Status: DC | PRN
  Administered 2015-04-14: 0.6 mg via INTRAVENOUS

## 2015-04-14 MED ORDER — ROCURONIUM BROMIDE 100 MG/10ML IV SOLN
INTRAVENOUS | Status: DC | PRN
  Administered 2015-04-14 (×2): 5 mg via INTRAVENOUS

## 2015-04-14 MED ORDER — ONDANSETRON HCL 4 MG/2ML IJ SOLN: 4.0000 mg | Freq: Once | INTRAMUSCULAR | Status: DC | PRN

## 2015-04-14 MED ORDER — AMLODIPINE BESYLATE 5 MG PO TABS
2.5000 mg | ORAL_TABLET | Freq: Every day | ORAL | Status: DC
  Administered 2015-04-15: 2.5 mg via ORAL
  Filled 2015-04-14: qty 1

## 2015-04-14 MED ORDER — FLEET ENEMA 7-19 GM/118ML RE ENEM: 1.0000 | ENEMA | Freq: Once | RECTAL | Status: DC | PRN

## 2015-04-14 MED ORDER — MORPHINE SULFATE (PF) 4 MG/ML IV SOLN: 3.0000 mg | INTRAVENOUS | Status: DC | PRN

## 2015-04-14 MED ORDER — DEXAMETHASONE SODIUM PHOSPHATE 4 MG/ML IJ SOLN
INTRAMUSCULAR | Status: DC | PRN
  Administered 2015-04-14: 10 mg via INTRAVENOUS

## 2015-04-14 SURGICAL SUPPLY — 36 items
BLADE SURG 15 STRL LF DISP TIS (BLADE) ×2 IMPLANT
BLADE SURG 15 STRL SS (BLADE) ×4
CANISTER SUCT 1200ML W/VALVE (MISCELLANEOUS) ×3 IMPLANT
CORD BIP STRL DISP 12FT (MISCELLANEOUS) ×3 IMPLANT
DRAIN TLS ROUND 10FR (DRAIN) IMPLANT
DRAPE MAG INST 16X20 L/F (DRAPES) ×3 IMPLANT
DRSG TEGADERM 2-3/8X2-3/4 SM (GAUZE/BANDAGES/DRESSINGS) ×3 IMPLANT
DRSG TEGADERM 4X4.75 (GAUZE/BANDAGES/DRESSINGS) ×3 IMPLANT
DRSG TELFA 3X8 NADH (GAUZE/BANDAGES/DRESSINGS) ×3 IMPLANT
ELECT LARYNGEAL 6/7 (MISCELLANEOUS)
ELECT LARYNGEAL 8/9 (MISCELLANEOUS) ×3
ELECTRODE LARYNGEAL 6/7 (MISCELLANEOUS) IMPLANT
ELECTRODE LARYNGEAL 8/9 (MISCELLANEOUS) ×1 IMPLANT
FORCEPS JEWEL BIP 4-3/4 STR (INSTRUMENTS) ×3 IMPLANT
GLOVE BIO SURGEON STRL SZ7.5 (GLOVE) ×6 IMPLANT
GLOVE EXAM LX STRL 7.5 (GLOVE) ×9 IMPLANT
GOWN STRL REUS W/ TWL LRG LVL3 (GOWN DISPOSABLE) ×3 IMPLANT
GOWN STRL REUS W/TWL LRG LVL3 (GOWN DISPOSABLE) ×6
HARMONIC SCALPEL FOCUS (MISCELLANEOUS) ×3 IMPLANT
HEMOSTAT SURGICEL 2X3 (HEMOSTASIS) ×3 IMPLANT
HOOK STAY BLUNT/RETRACTOR 5M (MISCELLANEOUS) ×3 IMPLANT
LABEL OR SOLS (LABEL) ×3 IMPLANT
NS IRRIG 500ML POUR BTL (IV SOLUTION) ×3 IMPLANT
PACK HEAD/NECK (MISCELLANEOUS) ×3 IMPLANT
PAD GROUND ADULT SPLIT (MISCELLANEOUS) ×3 IMPLANT
PROBE NEUROSIGN BIPOL (MISCELLANEOUS) ×1 IMPLANT
PROBE NEUROSIGN BIPOLAR (MISCELLANEOUS) ×2
SPONGE KITTNER 5P (MISCELLANEOUS) ×3 IMPLANT
SPONGE XRAY 4X4 16PLY STRL (MISCELLANEOUS) ×3 IMPLANT
STRAP SAFETY BODY (MISCELLANEOUS) ×3 IMPLANT
SUT ETHILON 6 0 9-3 1X18 BLK (SUTURE) ×3 IMPLANT
SUT PROLENE 6 0 P 1 18 (SUTURE) ×3 IMPLANT
SUT SILK 2 0 (SUTURE) ×2
SUT SILK 2-0 18XBRD TIE 12 (SUTURE) ×1 IMPLANT
SUT VIC AB 4-0 RB1 18 (SUTURE) ×6 IMPLANT
SYSTEM CHEST DRAIN TLS 7FR (DRAIN) ×6 IMPLANT

## 2015-04-14 NOTE — H&P (Signed)
  Her lungs are clear to auscultation.  Her heart shows a regular rate and rhythm.  H&P has been reviewed and no further changes necessary. To be downloaded later.

## 2015-04-14 NOTE — Anesthesia Postprocedure Evaluation (Signed)
Anesthesia Post Note  Patient: Mary Parks  Procedure(s) Performed: Procedure(s) (LRB): THYROIDECTOMY (N/A)  Patient location during evaluation: PACU Anesthesia Type: General Level of consciousness: awake and alert Pain management: pain level controlled Vital Signs Assessment: post-procedure vital signs reviewed and stable Respiratory status: spontaneous breathing Cardiovascular status: blood pressure returned to baseline Postop Assessment: No headache Anesthetic complications: no    Last Vitals:  Filed Vitals:   04/14/15 1043 04/14/15 1101  BP: 136/70 131/68  Pulse: 79 86  Temp:    Resp: 17 17    Last Pain:  Filed Vitals:   04/14/15 1104  PainSc: 6                  Denitra Donaghey M

## 2015-04-14 NOTE — Anesthesia Procedure Notes (Addendum)
Procedure Name: Intubation Date/Time: 04/14/2015 7:39 AM Performed by: Courtney Paris Pre-anesthesia Checklist: Patient identified, Emergency Drugs available, Suction available and Patient being monitored Patient Re-evaluated:Patient Re-evaluated prior to inductionOxygen Delivery Method: Circle system utilized Preoxygenation: Pre-oxygenation with 100% oxygen Intubation Type: IV induction and Combination inhalational/ intravenous induction Ventilation: Mask ventilation without difficulty Grade View: Grade III Tube type: Oral Tube size: 7.0 mm Number of attempts: 2 Airway Equipment and Method: Rigid stylet Placement Confirmation: ETT inserted through vocal cords under direct vision,  positive ETCO2,  CO2 detector and breath sounds checked- equal and bilateral Secured at: 22 cm Tube secured with: Tape Dental Injury: Teeth and Oropharynx as per pre-operative assessment  Difficulty Due To: Difficulty was anticipated, Difficult Airway- due to anterior larynx, Difficult Airway- due to large tongue and Difficult Airway- due to dentition Future Recommendations: Recommend- induction with short-acting agent, and alternative techniques readily available   Date/Time: 04/14/2015 7:43 AM Performed by: Courtney Paris

## 2015-04-14 NOTE — Transfer of Care (Signed)
Immediate Anesthesia Transfer of Care Note  Patient: Mary Parks  Procedure(s) Performed: Procedure(s): THYROIDECTOMY (N/A)  Patient Location: PACU  Anesthesia Type:General  Level of Consciousness: awake, oriented and patient cooperative  Airway & Oxygen Therapy: Patient Spontanous Breathing and Patient connected to face mask oxygen  Post-op Assessment: Report given to RN and Post -op Vital signs reviewed and stable  Post vital signs: stable  Last Vitals:  Filed Vitals:   04/14/15 0941 04/14/15 0943  BP: 127/73 127/73  Pulse: 93 94  Temp: 36.6 C 36.6 C  Resp: 12 10    Complications: No apparent anesthesia complications

## 2015-04-14 NOTE — Anesthesia Preprocedure Evaluation (Signed)
Anesthesia Evaluation  Patient identified by MRN, date of birth, ID band Patient awake    Reviewed: Allergy & Precautions, NPO status , Patient's Chart, lab work & pertinent test results  Airway Mallampati: III  TM Distance: >3 FB Neck ROM: Limited    Dental  (+) Teeth Intact   Pulmonary shortness of breath and with exertion, asthma , COPD,  COPD inhaler,    Pulmonary exam normal        Cardiovascular Exercise Tolerance: Poor hypertension, Pt. on medications Normal cardiovascular exam     Neuro/Psych    GI/Hepatic GERD  Medicated and Controlled,  Endo/Other  diabetes, Type 2BG 167.  Renal/GU      Musculoskeletal   Abdominal (+) + obese,   Peds  Hematology   Anesthesia Other Findings   Reproductive/Obstetrics                             Anesthesia Physical Anesthesia Plan  ASA: III  Anesthesia Plan: General   Post-op Pain Management:    Induction: Intravenous  Airway Management Planned: Oral ETT and Video Laryngoscope Planned  Additional Equipment:   Intra-op Plan:   Post-operative Plan: Extubation in OR  Informed Consent: I have reviewed the patients History and Physical, chart, labs and discussed the procedure including the risks, benefits and alternatives for the proposed anesthesia with the patient or authorized representative who has indicated his/her understanding and acceptance.     Plan Discussed with: CRNA  Anesthesia Plan Comments:         Anesthesia Quick Evaluation

## 2015-04-14 NOTE — Progress Notes (Signed)
04/14/15 6:05pm  Pt feeling good, pain seems to be controlled with meds- using Tramadol.  Wound- neck flat, dressing dry, voice clear.  Doing well postop.  Will check calcium in the am. Will plan to pull drains and d/c home in the am.

## 2015-04-14 NOTE — Op Note (Signed)
04/14/2015  9:31 AM    Rhoderick Moody  [id number]   Pre-Op Dx:  Multiple thyroid nodules  Post-op Dx: Same  Proc: Total thyroidectomy   Surg:  Armand Preast H Asst.: Vaught, Creighton  Anes:  GOT  EBL:  100 mL  Comp:  None  Findings:  Very large nodule over the isthmus and towards the left side filling much of the left gland. The right gland had multiple nodules as well. Recurrent nerves were intact and stimulated at the end of the procedure. At least one parathyroid was clearly evident on both sides  Procedure: Patient was given general anesthesia by oral endotracheal intubation. The oral endotracheal tube was wrapped with electrical cords for continuous monitoring of the recurrent laryngeal nerves. A shoulder roll was placed and the neck was extended slightly. The neck was marked at a low skin crease and then 4 mL of 1% Xylocaine with epi 1:100,000 was used for infiltration. She was prepped and draped sterile fashion.  Skin incision was created through the subcutaneous and platysma. Some anterior cervical veins were cauterized using the Harmonic. The strap muscles were divided midline. Large nodule could be seen in the midline. Strap muscles were elevated over the right side first to evaluate this side. There are multiple nodules here. The lateral veins were cauterized with the Harmonic as the strap muscles were elevated. The inferior thyroid vessels were cauterized first with the Harmonic to free up the inferior and lateral pole. Superiorly the gland was freed up on both sides. There were multiple superior vessels that were coming in and these were clamped and cauterized with the Harmonic. Freed up superior to to allow it to roll over. I but there was a small parathyroid here superiorly however there was a definite larger inferior parathyroid gland that was found and was spared. With the thyroid gland now rotated medially tissues were bluntly dissected here and the recurrent laryngeal  nerve was found. Attachments inferior to the gland were then separated to free the gland from the trachea inferiorly. The superior attachments of Berry's ligament were then freed up to allow the gland to separate from the trachea right side. The recurrent laryngeal nerve was intact and stimulated well. There is no significant bleeding in the bed.  The strap muscles were then elevated over the large nodule on her right side and this was rotated towards the middle. The lateral attachments veins were cut across the Harmonic in the inferior attachments were tied across well area the inferior thyroid artery and vein were cut across the Harmonic. Superior dissection was then done in freeing up the strap muscles gland. A small hard white nodule on the anterior medial border of the upper thyroid gland. This was dissected around this it was is densely adherent to the anterior larynx. The superior attachments to the thyroid gland were then freed up and the vessels were cut with the Harmonic area a parathyroid gland was found here and left in the bed. It superior pole dissected out the gland was then rotated medially further to expose the under surface of it. Blunt dissection was done in the bed and the recurrent laryngeal nerve was found here. The nerve was dissected out and the attachments inferior to it on the trachea were then freed up. The remaining attachments of Berry's ligament were then freed up and the gland was fully removed from the bed. Recurrent laryngeal nerves attack stimulate well. There was no significant bleeding on either side. Wound was irrigated copiously. No further  bleeding found. Surgicel was placed on both sides the wound. #7 [place] TLS drains were placed bilaterally and brought out through separate inferior stab incisions that across midline.  The wound was then closed with 1 stitch at strap muscles to hold the fascia in the midline. This was done with 4-0 Vicryl. This was also used then to close  the platysma muscle. Multiple interrupted sutures were placed. The dermis was then closed with 40 Vicryls. The skin edge was then held in position with a 6-0 running nylon locking suture. The wound was covered with bacitracin Telfa and a Tegaderm dressing.  the drains were placed to low continuous Vacutainer suction.  Patient was awakened taken to the recovery room in satisfactory condition. There were no operative complications. Total estimated blood loss was 100 mL.  Dispo:   To PACU then to go to the surgical ward be watched overnight.  Plan:  We'll check her calcium level in the morning and make sure her airway is clear. We'll plan to discharge her home in the morning if she is doing well.  Kenzlie Disch H  04/14/2015 9:31 AM

## 2015-04-15 DIAGNOSIS — Z888 Allergy status to other drugs, medicaments and biological substances status: Secondary | ICD-10-CM | POA: Diagnosis not present

## 2015-04-15 DIAGNOSIS — J449 Chronic obstructive pulmonary disease, unspecified: Secondary | ICD-10-CM | POA: Diagnosis not present

## 2015-04-15 DIAGNOSIS — R0602 Shortness of breath: Secondary | ICD-10-CM | POA: Diagnosis not present

## 2015-04-15 DIAGNOSIS — Z882 Allergy status to sulfonamides status: Secondary | ICD-10-CM | POA: Diagnosis not present

## 2015-04-15 DIAGNOSIS — Z79899 Other long term (current) drug therapy: Secondary | ICD-10-CM | POA: Diagnosis not present

## 2015-04-15 DIAGNOSIS — I10 Essential (primary) hypertension: Secondary | ICD-10-CM | POA: Diagnosis not present

## 2015-04-15 DIAGNOSIS — Z833 Family history of diabetes mellitus: Secondary | ICD-10-CM | POA: Diagnosis not present

## 2015-04-15 DIAGNOSIS — E119 Type 2 diabetes mellitus without complications: Secondary | ICD-10-CM | POA: Diagnosis not present

## 2015-04-15 DIAGNOSIS — E669 Obesity, unspecified: Secondary | ICD-10-CM | POA: Diagnosis not present

## 2015-04-15 DIAGNOSIS — N289 Disorder of kidney and ureter, unspecified: Secondary | ICD-10-CM | POA: Diagnosis not present

## 2015-04-15 DIAGNOSIS — E042 Nontoxic multinodular goiter: Secondary | ICD-10-CM | POA: Diagnosis not present

## 2015-04-15 DIAGNOSIS — M1990 Unspecified osteoarthritis, unspecified site: Secondary | ICD-10-CM | POA: Diagnosis not present

## 2015-04-15 DIAGNOSIS — Z794 Long term (current) use of insulin: Secondary | ICD-10-CM | POA: Diagnosis not present

## 2015-04-15 DIAGNOSIS — K219 Gastro-esophageal reflux disease without esophagitis: Secondary | ICD-10-CM | POA: Diagnosis not present

## 2015-04-15 DIAGNOSIS — J309 Allergic rhinitis, unspecified: Secondary | ICD-10-CM | POA: Diagnosis not present

## 2015-04-15 DIAGNOSIS — Z6831 Body mass index (BMI) 31.0-31.9, adult: Secondary | ICD-10-CM | POA: Diagnosis not present

## 2015-04-15 LAB — GLUCOSE, CAPILLARY: GLUCOSE-CAPILLARY: 152 mg/dL — AB (ref 65–99)

## 2015-04-15 LAB — CALCIUM: Calcium: 9.3 mg/dL (ref 8.9–10.3)

## 2015-04-15 LAB — SURGICAL PATHOLOGY

## 2015-04-15 NOTE — Discharge Summary (Signed)
Physician Discharge Summary  Patient ID: Mary Parks MRN: [id number] DOB/AGE: 1950-12-13 64 y.o.  Admit date: 04/14/2015 Discharge date: 04/15/2015  Admission Diagnoses: Multiple thyroid nodules  Discharge Diagnoses: Multiple thyroid nodules Active Problems:   S/P total thyroidectomy   Discharged Condition: good  Hospital Course: Patient is surgery yesterday and tolerated this well. Her drains were removed this morning with no swelling or signs of bruising. Her voice is clear and she is swallowing well. She is to follow-up in the office in 1 week for suture removal.  If pathology report is negative, will start on thyroid medication at that time  Consults: None  Significant Diagnostic Studies: labs: Calcium stable  Treatments: surgery: Total thyroidectomy  Discharge Exam: Blood pressure 122/59, pulse 70, temperature 98 F (36.7 C), temperature source Oral, resp. rate 17, height 5\' 4"  (1.626 m), weight 83.008 kg (183 lb), SpO2 100 %. Neck wound stable and healing well  Disposition: 01-Home or Self Care  Discharge Instructions    Diet general    Complete by:  As directed      Increase activity slowly    Complete by:  As directed             Medication List    STOP taking these medications        aspirin 81 MG EC tablet     meclizine 25 MG tablet  Commonly known as:  ANTIVERT     VIGAMOX 0.5 % ophthalmic solution  Generic drug:  moxifloxacin      TAKE these medications        amLODipine 2.5 MG tablet  Commonly known as:  NORVASC  Take 1 tablet (2.5 mg total) by mouth daily.     atorvastatin 40 MG tablet  Commonly known as:  LIPITOR  Take 40 mg by mouth daily.     budesonide-formoterol 160-4.5 MCG/ACT inhaler  Commonly known as:  SYMBICORT  Inhale 2 puffs into the lungs 2 (two) times daily.     calcium-vitamin D 500-200 MG-UNIT tablet  Commonly known as:  OSCAL WITH D  Take 1 tablet by mouth.     D 2000 2000 UNITS Tabs  Generic drug:   Cholecalciferol  Take by mouth.     DUREZOL 0.05 % Emul  Generic drug:  Difluprednate     fluticasone 50 MCG/ACT nasal spray  Commonly known as:  FLONASE  Place 2 sprays into both nostrils daily.     HUMALOG KWIKPEN 100 UNIT/ML KiwkPen  Generic drug:  insulin lispro  INJECT 20 UNITS BEFORE BREAKFAST, 20 UNITS BEFORE LUNCH AND 30 UNITS BEFORE EVENING MEAL     ILEVRO 0.3 % ophthalmic suspension  Generic drug:  nepafenac     INVOKANA 300 MG Tabs tablet  Generic drug:  canagliflozin  Take 300 mg by mouth daily before breakfast.     LANTUS SOLOSTAR 100 UNIT/ML Solostar Pen  Generic drug:  Insulin Glargine  50 units at bedtime     lisinopril 20 MG tablet  Commonly known as:  PRINIVIL,ZESTRIL  TAKE ONE TABLET BY MOUTH ONCE DAILY     montelukast 10 MG tablet  Commonly known as:  SINGULAIR  Take 10 mg by mouth daily.     omeprazole 20 MG capsule  Commonly known as:  PRILOSEC     ONE TOUCH ULTRA TEST test strip  Generic drug:  glucose blood     ONETOUCH DELICA LANCETS 99991111 Misc     pregabalin 100 MG capsule  Commonly known as:  LYRICA  Take 100 mg by mouth 2 (two) times daily.     traMADol 50 MG tablet  Commonly known as:  ULTRAM  Take 50-100 mg by mouth every 6 (six) hours as needed.         Signed: Sherissa Tenenbaum H 04/15/2015, 7:32 AM

## 2015-04-15 NOTE — Progress Notes (Signed)
Pt stable. IV removed. D/c instructions given and education provided. Pt will be escorted out by staff and driven home by family.

## 2015-04-21 DIAGNOSIS — N182 Chronic kidney disease, stage 2 (mild): Secondary | ICD-10-CM | POA: Diagnosis not present

## 2015-04-21 DIAGNOSIS — Z6836 Body mass index (BMI) 36.0-36.9, adult: Secondary | ICD-10-CM | POA: Diagnosis not present

## 2015-04-25 DIAGNOSIS — E559 Vitamin D deficiency, unspecified: Secondary | ICD-10-CM | POA: Diagnosis not present

## 2015-04-25 DIAGNOSIS — E119 Type 2 diabetes mellitus without complications: Secondary | ICD-10-CM | POA: Diagnosis not present

## 2015-04-25 DIAGNOSIS — E039 Hypothyroidism, unspecified: Secondary | ICD-10-CM | POA: Diagnosis not present

## 2015-04-25 DIAGNOSIS — E781 Pure hyperglyceridemia: Secondary | ICD-10-CM | POA: Diagnosis not present

## 2015-04-25 DIAGNOSIS — E032 Hypothyroidism due to medicaments and other exogenous substances: Secondary | ICD-10-CM | POA: Diagnosis not present

## 2015-05-01 DIAGNOSIS — E1165 Type 2 diabetes mellitus with hyperglycemia: Secondary | ICD-10-CM | POA: Diagnosis not present

## 2015-05-01 DIAGNOSIS — E032 Hypothyroidism due to medicaments and other exogenous substances: Secondary | ICD-10-CM | POA: Diagnosis not present

## 2015-05-06 ENCOUNTER — Telehealth: Payer: Self-pay

## 2015-05-06 NOTE — Telephone Encounter (Signed)
Patient called stating that she needs a letter faxed to [employer] stating that she has diabetes. She stated they are asking her for the letter and she provided a fax number to send it to- [phone]. I can type this if need be. What all do I need to type in it?

## 2015-05-06 NOTE — Telephone Encounter (Signed)
Letter typed, faxed, and put in scan box to be scanned into patient's chart.

## 2015-05-06 NOTE — Telephone Encounter (Signed)
I would send them the last note that mentions her diabetes or just a short letter saying "To whom it may concern: Ms Salisbury is under our care for Type 2 insulin dependent diabetes mellitus"

## 2015-05-16 ENCOUNTER — Other Ambulatory Visit: Payer: Self-pay | Admitting: Family Medicine

## 2015-05-19 ENCOUNTER — Other Ambulatory Visit: Payer: Self-pay | Admitting: Family Medicine

## 2015-05-22 DIAGNOSIS — Z7689 Persons encountering health services in other specified circumstances: Secondary | ICD-10-CM | POA: Diagnosis not present

## 2015-05-22 DIAGNOSIS — E039 Hypothyroidism, unspecified: Secondary | ICD-10-CM | POA: Diagnosis not present

## 2015-05-22 DIAGNOSIS — E1165 Type 2 diabetes mellitus with hyperglycemia: Secondary | ICD-10-CM | POA: Diagnosis not present

## 2015-05-30 DIAGNOSIS — E032 Hypothyroidism due to medicaments and other exogenous substances: Secondary | ICD-10-CM | POA: Diagnosis not present

## 2015-05-30 DIAGNOSIS — E1165 Type 2 diabetes mellitus with hyperglycemia: Secondary | ICD-10-CM | POA: Diagnosis not present

## 2015-06-25 ENCOUNTER — Other Ambulatory Visit: Payer: Self-pay | Admitting: Family Medicine

## 2015-06-25 NOTE — Telephone Encounter (Signed)
Your patient 

## 2015-06-27 DIAGNOSIS — E039 Hypothyroidism, unspecified: Secondary | ICD-10-CM | POA: Diagnosis not present

## 2015-06-27 DIAGNOSIS — E032 Hypothyroidism due to medicaments and other exogenous substances: Secondary | ICD-10-CM | POA: Diagnosis not present

## 2015-07-07 ENCOUNTER — Ambulatory Visit (INDEPENDENT_AMBULATORY_CARE_PROVIDER_SITE_OTHER): Payer: Medicare Other | Admitting: Unknown Physician Specialty

## 2015-07-07 ENCOUNTER — Encounter: Payer: Self-pay | Admitting: Unknown Physician Specialty

## 2015-07-07 VITALS — BP 143/85 | HR 108 | Temp 97.4°F | Ht 63.6 in | Wt 182.8 lb

## 2015-07-07 DIAGNOSIS — N183 Chronic kidney disease, stage 3 unspecified: Secondary | ICD-10-CM

## 2015-07-07 DIAGNOSIS — I129 Hypertensive chronic kidney disease with stage 1 through stage 4 chronic kidney disease, or unspecified chronic kidney disease: Secondary | ICD-10-CM | POA: Diagnosis not present

## 2015-07-07 DIAGNOSIS — Z794 Long term (current) use of insulin: Secondary | ICD-10-CM | POA: Diagnosis not present

## 2015-07-07 DIAGNOSIS — E1122 Type 2 diabetes mellitus with diabetic chronic kidney disease: Secondary | ICD-10-CM | POA: Diagnosis not present

## 2015-07-07 DIAGNOSIS — E785 Hyperlipidemia, unspecified: Secondary | ICD-10-CM | POA: Diagnosis not present

## 2015-07-07 DIAGNOSIS — E0822 Diabetes mellitus due to underlying condition with diabetic chronic kidney disease: Secondary | ICD-10-CM | POA: Diagnosis not present

## 2015-07-07 DIAGNOSIS — N189 Chronic kidney disease, unspecified: Secondary | ICD-10-CM | POA: Diagnosis not present

## 2015-07-07 DIAGNOSIS — J309 Allergic rhinitis, unspecified: Secondary | ICD-10-CM | POA: Diagnosis not present

## 2015-07-07 DIAGNOSIS — M25562 Pain in left knee: Secondary | ICD-10-CM

## 2015-07-07 LAB — LIPID PANEL PICCOLO, WAIVED
Chol/HDL Ratio Piccolo,Waive: 3.1 mg/dL
Cholesterol Piccolo, Waived: 157 mg/dL (ref <200)
HDL CHOL PICCOLO, WAIVED: 50 mg/dL — AB (ref >59)
LDL CHOL CALC PICCOLO WAIVED: 80 mg/dL (ref <100)
TRIGLYCERIDES PICCOLO,WAIVED: 135 mg/dL (ref <150)
VLDL CHOL CALC PICCOLO,WAIVE: 27 mg/dL (ref <30)

## 2015-07-07 LAB — MICROALBUMIN, URINE WAIVED
Creatinine, Urine Waived: 100 mg/dL (ref 10–300)
MICROALB, UR WAIVED: 80 mg/L — AB (ref 0–19)

## 2015-07-07 LAB — BAYER DCA HB A1C WAIVED: HB A1C (BAYER DCA - WAIVED): 10.1 % — ABNORMAL HIGH (ref <7.0)

## 2015-07-07 NOTE — Assessment & Plan Note (Signed)
Stable, continue present medications.   

## 2015-07-07 NOTE — Progress Notes (Signed)
BP 143/85 mmHg  Pulse 108  Temp(Src) 97.4 F (36.3 C)  Ht 5' 3.6" (1.615 m)  Wt 182 lb 12.8 oz (82.918 kg)  BMI 31.79 kg/m2  SpO2 98%  LMP  (LMP Unknown)   Subjective:    Patient ID: Mary Parks, female    DOB: 30-Nov-1950, 65 y.o.   MRN: [id number]  HPI: Mary Parks is a 65 y.o. female  Chief Complaint  Patient presents with  . Diabetes  . Hyperlipidemia  . Hypertension  . Knee Pain    pt states her left knee is bothering her   Diabetes:  Using medications without difficulties: yes Hypoglycemic episodes:denies Hyperglycemic episodes: denies Feet problems: denies Blood Sugars averaging: Usually 113-126 in mornings; before dinner usually around 170. Eye exam within last year: 03/2015 and sees eye doctor regularly Last A1c: 7.4 in 03/2015  Hypertension:    Using medication without problems or lightheadedness No chest pain with exertion. No edema No shortness of breath Average home BPs: occasionally takes but unsure of value  Elevated Cholesterol: Using medications without problems No muscle aches  Diet compliance: eats toast, applesauce, bacon, boiled egg for breakfast, salads for lunch, pork chop or salmon with vegetables for dinner. Drinks a lot of water and sugar free powder flavors. Does not drink alcohol.  Exercise: Tries to walk 2 miles 2 days a week.   Knee Pain: Arthritis in bilateral knees. Left knee pain 9/10 today.  Takes tylenol for pain and helps to relieve. Pain and stiffness worse when she sits and improves when she walks.   Allergies: Patient wants something for sinus symptoms. States she has rhinorrhea with clear exudate. Wants something other than Flonase and Singulair. Has been taking Zyrtec D and it makes her feel "fuzzy."   Relevant past medical, surgical, family and social history reviewed and updated as indicated. Interim medical history since our last visit reviewed. Allergies and medications reviewed and updated.  Review of Systems   Constitutional: Negative.   HENT: Negative.   Eyes: Negative.   Respiratory: Negative.   Cardiovascular: Negative.   Gastrointestinal: Negative.   Endocrine: Negative.   Genitourinary: Negative.   Musculoskeletal: Positive for arthralgias.  Skin: Negative.   Allergic/Immunologic: Negative.   Neurological: Negative.   Hematological: Negative.   Psychiatric/Behavioral: Negative.     Per HPI unless specifically indicated above     Objective:    BP 143/85 mmHg  Pulse 108  Temp(Src) 97.4 F (36.3 C)  Ht 5' 3.6" (1.615 m)  Wt 182 lb 12.8 oz (82.918 kg)  BMI 31.79 kg/m2  SpO2 98%  LMP  (LMP Unknown)  Wt Readings from Last 3 Encounters:  07/07/15 182 lb 12.8 oz (82.918 kg)  04/14/15 183 lb (83.008 kg)  04/08/15 183 lb (83.008 kg)    Physical Exam  Constitutional: She is oriented to person, place, and time. She appears well-developed and well-nourished. No distress.  HENT:  Head: Normocephalic and atraumatic.  Eyes: Conjunctivae and lids are normal. Right eye exhibits no discharge. Left eye exhibits no discharge. No scleral icterus.  Neck: Normal range of motion. Neck supple. No JVD present. Carotid bruit is not present.  Cardiovascular: Normal rate, regular rhythm and normal heart sounds.   Pulmonary/Chest: Effort normal and breath sounds normal.  Abdominal: Normal appearance. There is no splenomegaly or hepatomegaly.  Musculoskeletal: Normal range of motion.  Neurological: She is alert and oriented to person, place, and time.  Skin: Skin is warm, dry and intact. No rash noted.  No pallor.  Psychiatric: She has a normal mood and affect. Her behavior is normal. Judgment and thought content normal.   Assessment & Plan:   Problem List Items Addressed This Visit      Unprioritized   Diabetes mellitus with chronic kidney disease ([place]) - Primary    Today's A1c is 10.1. Instructed patient to titrate Lantus based on fasting blood sugar.  Increase long acting (daily insulin)  Lantus 2 units if fasting blood sugar is greater than 120.  Decrease by 2 units if fasting blood sugar is less than 95.  Also, take 20 units of Humalog immediately before eating.            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        Other Visit Diagnoses    Benign hypertension with chronic kidney disease, stage III        Relevant Orders    Lipid Panel Piccolo, Waived    Microalbumin, Urine Waived    Comprehensive metabolic panel    Knee pain, left        Patient has osteoarthritis with left knee flaring up more recently. Encouraged her to continue PRN tylenol use and walk more in order to relieve symptoms.     Hyperlipidemia        stable and continue present medication    Allergic rhinitis, unspecified allergic rhinitis type        Instructed patient to try Zyrtec (ceterizine) to relieve symptoms instead of Zyrtec D.         Follow up plan: Return in about 3 months (around 10/04/2015).

## 2015-07-07 NOTE — Assessment & Plan Note (Signed)
Today's A1c is 10.1. Instructed patient to titrate Lantus based on fasting blood sugar.  Increase long acting (daily insulin) Lantus 2 units if fasting blood sugar is greater than 120.  Decrease by 2 units if fasting blood sugar is less than 95.  Also, take 20 units of Humalog immediately before eating.

## 2015-07-07 NOTE — Patient Instructions (Addendum)
Base your long acting insulin on your fasting (usually in the morning) blood sugar.  Increase long acting (daily insulin) Lantus 2 units if fasting blood sugar is greater than 120.  Decrease by 2 units if fasting blood sugar is less than 95.    Take 20 units of Humalog immediately before eating.  That might require a 3rd insulin shot.    Try taking plain Ceterizine (Zyrtec) in addition to other allergy medication

## 2015-07-08 LAB — COMPREHENSIVE METABOLIC PANEL
ALT: 20 IU/L (ref 0–32)
AST: 16 IU/L (ref 0–40)
Albumin/Globulin Ratio: 1.3 (ref 1.1–2.5)
Albumin: 3.9 g/dL (ref 3.6–4.8)
Alkaline Phosphatase: 95 IU/L (ref 39–117)
BUN/Creatinine Ratio: 16 (ref 11–26)
BUN: 17 mg/dL (ref 8–27)
Bilirubin Total: 0.3 mg/dL (ref 0.0–1.2)
CALCIUM: 9.6 mg/dL (ref 8.7–10.3)
CO2: 23 mmol/L (ref 18–29)
CREATININE: 1.09 mg/dL — AB (ref 0.57–1.00)
Chloride: 103 mmol/L (ref 96–106)
GFR calc Af Amer: 61 mL/min/{1.73_m2} (ref >59)
GFR, EST NON AFRICAN AMERICAN: 53 mL/min/{1.73_m2} — AB (ref >59)
Globulin, Total: 2.9 g/dL (ref 1.5–4.5)
Glucose: 269 mg/dL — ABNORMAL HIGH (ref 65–99)
POTASSIUM: 4.4 mmol/L (ref 3.5–5.2)
Sodium: 144 mmol/L (ref 134–144)
Total Protein: 6.8 g/dL (ref 6.0–8.5)

## 2015-07-14 ENCOUNTER — Ambulatory Visit (INDEPENDENT_AMBULATORY_CARE_PROVIDER_SITE_OTHER): Payer: Medicare Other | Admitting: Unknown Physician Specialty

## 2015-07-14 ENCOUNTER — Encounter: Payer: Self-pay | Admitting: Unknown Physician Specialty

## 2015-07-14 VITALS — BP 149/88 | HR 111 | Temp 98.2°F | Ht 64.0 in | Wt 183.8 lb

## 2015-07-14 DIAGNOSIS — M170 Bilateral primary osteoarthritis of knee: Secondary | ICD-10-CM

## 2015-07-14 DIAGNOSIS — Z7409 Other reduced mobility: Secondary | ICD-10-CM

## 2015-07-14 NOTE — Progress Notes (Signed)
BP 149/88 mmHg  Pulse 111  Temp(Src) 98.2 F (36.8 C)  Ht 5\' 4"  (1.626 m)  Wt 183 lb 12.8 oz (83.371 kg)  BMI 31.53 kg/m2  SpO2 97%  LMP  (LMP Unknown)   Subjective:    Patient ID: Mary Parks, female    DOB: 04/10/1951, 65 y.o.   MRN: [id number]  HPI: Mary Parks is a 65 y.o. female  Chief Complaint  Patient presents with  . Knee Pain    pt states she would like both knee injected. Pt states she would like a note to take to post office so they can move her mailbox closer to her house so she does not have to cross the road.   Poor mobility  Pt with bilateral knee pain and well known bilateral knee OA.  She states that she cannot walk across the road due to pain in her knee.  She has had injections before in this office which helped and would like another in her left knee.  Knee Pain  There was no injury mechanism. The pain is at a severity of 6/10. The pain is moderate. The pain has been constant since onset. The symptoms are aggravated by movement. She has tried elevation, heat and non-weight bearing for the symptoms. The treatment provided mild relief.       Relevant past medical, surgical, family and social history reviewed and updated as indicated. Interim medical history since our last visit reviewed. Allergies and medications reviewed and updated.  Review of Systems  Constitutional: Negative.   HENT: Negative.   Respiratory: Negative.   Gastrointestinal: Negative.   Psychiatric/Behavioral: Negative.     Per HPI unless specifically indicated above     Objective:    BP 149/88 mmHg  Pulse 111  Temp(Src) 98.2 F (36.8 C)  Ht 5\' 4"  (1.626 m)  Wt 183 lb 12.8 oz (83.371 kg)  BMI 31.53 kg/m2  SpO2 97%  LMP  (LMP Unknown)  Wt Readings from Last 3 Encounters:  07/14/15 183 lb 12.8 oz (83.371 kg)  07/07/15 182 lb 12.8 oz (82.918 kg)  04/14/15 183 lb (83.008 kg)    Physical Exam  Constitutional: She is oriented to person, place, and time. She appears  well-developed and well-nourished. No distress.  HENT:  Head: Normocephalic and atraumatic.  Eyes: Conjunctivae and lids are normal. Right eye exhibits no discharge. Left eye exhibits no discharge. No scleral icterus.  Cardiovascular: Normal rate.   Pulmonary/Chest: Effort normal.  Abdominal: Normal appearance. There is no splenomegaly or hepatomegaly.  Musculoskeletal: Normal range of motion.  Neurological: She is alert and oriented to person, place, and time.  Skin: Skin is intact. No rash noted. No pallor.  Psychiatric: She has a normal mood and affect. Her behavior is normal. Judgment and thought content normal.   STEROID INJECTION  Procedure: Knee Intraarticular Steroid Injection   Description: After verbal consent and patient ed on Area prepped and draped using  semi-sterile technique. Using a anterior  approach, a mixture of 9 cc of  1% Marcaine & 1 cc of Kenalog 40 was injected into knee joint.  A bandage was then placed over the injection site. Complications:  none Post Procedure Instructions: To the ER if any symptoms of erythema or swelling.      Results for orders placed or performed in visit on 07/07/15  Bayer DCA Hb A1c Waived  Result Value Ref Range   Bayer DCA Hb A1c Waived 10.1 (H) <7.0 %  Lipid  Panel Piccolo, [employer]  Result Value Ref Range   Cholesterol Piccolo, Waived 157 <200 mg/dL   HDL Chol Piccolo, Waived 50 (L) >59 mg/dL   Triglycerides Piccolo,Waived 135 <150 mg/dL   Chol/HDL Ratio Piccolo,Waive 3.1 mg/dL   LDL Chol Calc Piccolo Waived 80 <100 mg/dL   VLDL Chol Calc Piccolo,Waive 27 <30 mg/dL  Microalbumin, Urine Waived  Result Value Ref Range   Microalb, Ur Waived 80 (H) 0 - 19 mg/L   Creatinine, Urine Waived 100 10 - 300 mg/dL   Microalb/Creat Ratio 30-300 (H) <30 mg/g  Comprehensive metabolic panel  Result Value Ref Range   Glucose 269 (H) 65 - 99 mg/dL   BUN 17 8 - 27 mg/dL   Creatinine, Ser 1.09 (H) 0.57 - 1.00 mg/dL   GFR calc non Af Amer 53  (L) >59 mL/min/1.73   GFR calc Af Amer 61 >59 mL/min/1.73   BUN/Creatinine Ratio 16 11 - 26   Sodium 144 134 - 144 mmol/L   Potassium 4.4 3.5 - 5.2 mmol/L   Chloride 103 96 - 106 mmol/L   CO2 23 18 - 29 mmol/L   Calcium 9.6 8.7 - 10.3 mg/dL   Total Protein 6.8 6.0 - 8.5 g/dL   Albumin 3.9 3.6 - 4.8 g/dL   Globulin, Total 2.9 1.5 - 4.5 g/dL   Albumin/Globulin Ratio 1.3 1.1 - 2.5   Bilirubin Total 0.3 0.0 - 1.2 mg/dL   Alkaline Phosphatase 95 39 - 117 IU/L   AST 16 0 - 40 IU/L   ALT 20 0 - 32 IU/L      Assessment & Plan:   Problem List Items Addressed This Visit    None    Visit Diagnoses    Primary osteoarthritis of both knees    -  Primary    Injection today    Poor mobility        Note written for post office        Follow up plan: Return if symptoms worsen or fail to improve.

## 2015-08-01 DIAGNOSIS — E781 Pure hyperglyceridemia: Secondary | ICD-10-CM | POA: Diagnosis not present

## 2015-08-01 DIAGNOSIS — E559 Vitamin D deficiency, unspecified: Secondary | ICD-10-CM | POA: Diagnosis not present

## 2015-08-01 DIAGNOSIS — E1165 Type 2 diabetes mellitus with hyperglycemia: Secondary | ICD-10-CM | POA: Diagnosis not present

## 2015-08-01 DIAGNOSIS — E039 Hypothyroidism, unspecified: Secondary | ICD-10-CM | POA: Diagnosis not present

## 2015-08-01 DIAGNOSIS — E032 Hypothyroidism due to medicaments and other exogenous substances: Secondary | ICD-10-CM | POA: Diagnosis not present

## 2015-08-06 ENCOUNTER — Other Ambulatory Visit: Payer: Self-pay | Admitting: Unknown Physician Specialty

## 2015-08-14 ENCOUNTER — Other Ambulatory Visit: Payer: Self-pay | Admitting: Family Medicine

## 2015-08-14 NOTE — Telephone Encounter (Signed)
Your patient 

## 2015-08-18 ENCOUNTER — Encounter: Payer: Self-pay | Admitting: Emergency Medicine

## 2015-08-18 ENCOUNTER — Ambulatory Visit
Admission: EM | Admit: 2015-08-18 | Discharge: 2015-08-18 | Disposition: A | Discharge status: Home / Self Care | Payer: Medicare Other | Attending: Emergency Medicine | Admitting: Emergency Medicine

## 2015-08-18 DIAGNOSIS — H6981 Other specified disorders of Eustachian tube, right ear: Secondary | ICD-10-CM | POA: Diagnosis not present

## 2015-08-18 MED ORDER — AMOXICILLIN-POT CLAVULANATE 875-125 MG PO TABS: 1.0000 | ORAL_TABLET | Freq: Two times a day (BID) | ORAL | Status: DC

## 2015-08-18 NOTE — ED Provider Notes (Signed)
CSN: [id number]     Arrival date & time 08/18/15  [id number] History   First MD Initiated Contact with Patient 08/18/15 1312     Chief Complaint  Patient presents with  . Otalgia  . Cough   (Consider location/radiation/quality/duration/timing/severity/associated sxs/prior Treatment) HPI   65 year old female who presents with right ear pain that radiates inferiorly along her jawline into her neck. She states that she is also concerned that she's been wheezing. Cough and some sinus pain and pressure this is all started 2 days ago. She has a history of allergies and she is under the care of an ENT. She does use Flonase on a daily basis. She denies any recent airplane flights or mountain trips.  Past Medical History  Diagnosis Date  . HTN (hypertension)   . Obesity   . DM2 (diabetes mellitus, type 2) ([place]) 1987  . Hyperlipidemia   . GERD (gastroesophageal reflux disease)   . Chest pain   . Dyspnea     Cath normal coronaries. normal EF. RA 11. PA 37/17 (26) LVEDP 36  . Thyroid disease     having thyroid removed 04/14/15  . Asthma   . Osteoarthritis     knees  . Vertigo     none recently  . Renal disease    Past Surgical History  Procedure Laterality Date  . Cardiac catheterization    . Cataract extraction w/phaco Right 03/31/2015    Procedure: CATARACT EXTRACTION PHACO AND INTRAOCULAR LENS PLACEMENT (IOC);  Surgeon: Ronnell Freshwater, MD;  Location: [facility];  Service: Ophthalmology;  Laterality: Right;  DIABETIC - insulin  . Hernia repair      umbilical  . Thyroidectomy N/A 04/14/2015    Procedure: THYROIDECTOMY;  Surgeon: Margaretha Sheffield, MD;  Location: [facility];  Service: ENT;  Laterality: N/A;  . Eye surgery     Family History  Problem Relation Age of Onset  . Diabetes Mother   . Diabetes Father   . Diabetes Sister   . Diabetes Brother   . Heart disease Neg Hx   . Coronary artery disease Neg Hx   . Diabetes Sister   . Diabetes Sister   . Diabetes Maternal  Grandmother   . Diabetes Maternal Grandfather   . Diabetes Paternal Grandmother   . Diabetes Paternal Grandfather    Social History  Substance Use Topics  . Smoking status: Never Smoker   . Smokeless tobacco: Never Used  . Alcohol Use: No   OB History    No data available     Review of Systems  Constitutional: Negative for fever, chills, activity change and fatigue.  HENT: Positive for congestion, ear pain, postnasal drip, rhinorrhea, sinus pressure and sore throat. Negative for ear discharge and facial swelling.   Respiratory: Positive for cough. Negative for shortness of breath.   All other systems reviewed and are negative.   Allergies  Celebrex; Sulfa antibiotics; Sulfisoxazole; and Sulfur  Home Medications   Prior to Admission medications   Medication Sig Start Date End Date Taking? Authorizing Provider  amLODipine (NORVASC) 2.5 MG tablet Take 1 tablet (2.5 mg total) by mouth daily. 12/17/14   Kathrine Haddock, NP  amoxicillin-clavulanate (AUGMENTIN) 875-125 MG tablet Take 1 tablet by mouth every 12 (twelve) hours. 08/18/15   Lorin Picket, PA-C  atorvastatin (LIPITOR) 40 MG tablet TAKE ONE TABLET BY MOUTH ONCE DAILY AT BEDTIME 08/15/15   Kathrine Haddock, NP  budesonide-formoterol [facility]) 160-4.5 MCG/ACT inhaler Inhale 2 puffs into the lungs 2 (two)  times daily.    Historical Provider, MD  calcium-vitamin D (OSCAL WITH D) 500-200 MG-UNIT tablet Take 1 tablet by mouth.    Historical Provider, MD  Cholecalciferol (D 2000) 2000 UNITS TABS Take by mouth.    Historical Provider, MD  fluticasone (FLONASE) 50 MCG/ACT nasal spray Place 2 sprays into both nostrils daily.    Historical Provider, MD  HUMALOG KWIKPEN 100 UNIT/ML KiwkPen INJECT 20 UNITS BEFORE BREAKFAST, 20 UNITS BEFORE LUNCH AND 30 UNITS BEFORE EVENING MEAL 04/14/15   Kathrine Haddock, NP  LANTUS SOLOSTAR 100 UNIT/ML Solostar Pen INJECT 40-60 UNITS AT BEDTIME 08/06/15   Megan P Johnson, DO  levothyroxine (SYNTHROID,  LEVOTHROID) 100 MCG tablet Take 100 mcg by mouth daily. 07/02/15   Historical Provider, MD  lisinopril (PRINIVIL,ZESTRIL) 20 MG tablet Take 1 tablet (20 mg total) by mouth daily. 06/25/15   Kathrine Haddock, NP  montelukast (SINGULAIR) 10 MG tablet Take 10 mg by mouth daily.    Historical Provider, MD  omeprazole (PRILOSEC) 20 MG capsule Take 20 mg by mouth daily.  09/25/14   Historical Provider, MD  ONE TOUCH ULTRA TEST test strip 1 each by Other route 2 (two) times daily.  03/15/15   Historical Provider, MD  Jonetta Speak LANCETS [id number] MISC 2 (two) times daily.  03/15/15   Historical Provider, MD  pregabalin (LYRICA) 100 MG capsule Take 100 mg by mouth 2 (two) times daily.    Historical Provider, MD  traMADol (ULTRAM) 50 MG tablet Take 50-100 mg by mouth every 6 (six) hours as needed.    Historical Provider, MD   Meds Ordered and Administered this Visit  Medications - No data to display  BP 145/79 mmHg  Pulse 87  Temp(Src) 98.1 F (36.7 C) (Oral)  Resp 16  Ht 5\' 4"  (1.626 m)  Wt 180 lb (81.647 kg)  BMI 30.88 kg/m2  SpO2 100%  LMP  (LMP Unknown) No data found.   Physical Exam  Constitutional: She is oriented to person, place, and time. She appears well-developed and well-nourished. No distress.  HENT:  Head: Normocephalic and atraumatic.  Right Ear: External ear normal.  Left Ear: External ear normal.  Nose: Nose normal.  Mouth/Throat: Oropharynx is clear and moist. No oropharyngeal exudate.  Eyes: Conjunctivae are normal. Pupils are equal, round, and reactive to light.  Neck: Normal range of motion. Neck supple.  There is tenderness to palpation of the right mastoid and extends down into the throat along the jawline approximately 4 cm.  Pulmonary/Chest: Effort normal and breath sounds normal. No respiratory distress. She has no wheezes. She has no rales.  Musculoskeletal: Normal range of motion. She exhibits no edema or tenderness.  Vision does have difficulty transferring from chair  to the table because of knee arthritis  Lymphadenopathy:    She has no cervical adenopathy.  Neurological: She is alert and oriented to person, place, and time.  Skin: Skin is warm and dry. She is not diaphoretic.  Psychiatric: She has a normal mood and affect. Her behavior is normal. Judgment and thought content normal.  Nursing note and vitals reviewed.   ED Course  Procedures (including critical care time)  Labs Review Labs Reviewed - No data to display  Imaging Review No results found.   Visual Acuity Review  Right Eye Distance:   Left Eye Distance:   Bilateral Distance:    Right Eye Near:   Left Eye Near:    Bilateral Near:  MDM   1. Dysfunction of right Eustachian tube    Discharge Medication List as of 08/18/2015  1:26 PM    START taking these medications   Details  amoxicillin-clavulanate (AUGMENTIN) 875-125 MG tablet Take 1 tablet by mouth every 12 (twelve) hours., Starting 08/18/2015, Until Discontinued, Normal      Plan: 1. Test/x-ray results and diagnosis reviewed with patient 2. rx as per orders; risks, benefits, potential side effects reviewed with patient 3. Recommend supportive treatment with Intermediate use of Flonase. Have asked her to consider starting on Zyrtec as well. Because the mastoid tenderness and radiating into her jaw I will place her on Augmentin for 7 days. Advised her she is not feeling improvement she should be seen by her ENT, Dr.Juengle. 4. F/u prn if symptoms worsen or don't improve     Lorin Picket, PA-C 08/18/15 1333

## 2015-08-18 NOTE — ED Notes (Signed)
Patient c/o pain in both ears, cough, and sinus pain and pressure since Saturday.  Patient denies fevers.

## 2015-09-11 ENCOUNTER — Other Ambulatory Visit: Payer: Self-pay | Admitting: Unknown Physician Specialty

## 2015-09-26 DIAGNOSIS — E032 Hypothyroidism due to medicaments and other exogenous substances: Secondary | ICD-10-CM | POA: Diagnosis not present

## 2015-09-26 DIAGNOSIS — E039 Hypothyroidism, unspecified: Secondary | ICD-10-CM | POA: Diagnosis not present

## 2015-10-03 ENCOUNTER — Ambulatory Visit (INDEPENDENT_AMBULATORY_CARE_PROVIDER_SITE_OTHER): Payer: Medicare Other | Admitting: Unknown Physician Specialty

## 2015-10-03 ENCOUNTER — Encounter: Payer: Self-pay | Admitting: Unknown Physician Specialty

## 2015-10-03 VITALS — BP 138/83 | HR 97 | Temp 97.5°F | Ht 63.7 in | Wt 190.4 lb

## 2015-10-03 DIAGNOSIS — IMO0002 Reserved for concepts with insufficient information to code with codable children: Secondary | ICD-10-CM | POA: Insufficient documentation

## 2015-10-03 DIAGNOSIS — E1165 Type 2 diabetes mellitus with hyperglycemia: Secondary | ICD-10-CM | POA: Diagnosis not present

## 2015-10-03 DIAGNOSIS — Z0000 Encounter for general adult medical examination without abnormal findings: Secondary | ICD-10-CM | POA: Diagnosis not present

## 2015-10-03 DIAGNOSIS — E0822 Diabetes mellitus due to underlying condition with diabetic chronic kidney disease: Secondary | ICD-10-CM | POA: Diagnosis not present

## 2015-10-03 DIAGNOSIS — E1140 Type 2 diabetes mellitus with diabetic neuropathy, unspecified: Secondary | ICD-10-CM | POA: Diagnosis not present

## 2015-10-03 DIAGNOSIS — G6289 Other specified polyneuropathies: Secondary | ICD-10-CM

## 2015-10-03 DIAGNOSIS — Z794 Long term (current) use of insulin: Secondary | ICD-10-CM

## 2015-10-03 DIAGNOSIS — E1122 Type 2 diabetes mellitus with diabetic chronic kidney disease: Secondary | ICD-10-CM | POA: Insufficient documentation

## 2015-10-03 DIAGNOSIS — N183 Chronic kidney disease, stage 3 (moderate): Secondary | ICD-10-CM | POA: Diagnosis not present

## 2015-10-03 DIAGNOSIS — G629 Polyneuropathy, unspecified: Secondary | ICD-10-CM | POA: Insufficient documentation

## 2015-10-03 DIAGNOSIS — I10 Essential (primary) hypertension: Secondary | ICD-10-CM | POA: Insufficient documentation

## 2015-10-03 LAB — BAYER DCA HB A1C WAIVED: HB A1C: 10.2 % — AB (ref <7.0)

## 2015-10-03 LAB — HEMOGLOBIN A1C: HEMOGLOBIN A1C: 10.2

## 2015-10-03 MED ORDER — DULAGLUTIDE 1.5 MG/0.5ML ~~LOC~~ SOAJ: 1.5000 mg | Freq: Every morning | SUBCUTANEOUS | Status: DC

## 2015-10-03 NOTE — Assessment & Plan Note (Addendum)
Poor control with Hgb A1C of 10.2.  Offered GLP1 or SGLT 2 and pt elected weekly injection.  Pt ed on side-effects.  Gave samples of .75 for 2 weeks and then increase to 1.5 for 2 week.  Recheck in 1 months

## 2015-10-03 NOTE — Assessment & Plan Note (Signed)
Stable on Tramadol and Lyrica

## 2015-10-03 NOTE — Progress Notes (Signed)
-+------++++++++++  BP 138/83 mmHg  Pulse 97  Temp(Src) 97.5 F (36.4 C)  Ht 5' 3.7" (1.618 m)  Wt 190 lb 6.4 oz (86.365 kg)  BMI 32.99 kg/m2  SpO2 97%  LMP  (LMP Unknown)   Subjective:    Patient ID: Mary Parks, female    DOB: 20-Mar-1951, 65 y.o.   MRN: [id number]  HPI: Mary Parks is a 65 y.o. female  Chief Complaint  Patient presents with  . Diabetes  . Hyperlipidemia  . Hypertension  . Medication Refill    pt states she would like new rx for tramadol and lyrica, states she has been out  . Labs Only    Hep C order entered   Diabetes:  Taking Humalog 20 with breakfast and lunch and 30 with dinner Taking Lantus 50 units QHS Using medications without difficulties: yes Hypoglycemic episodes:denies Hyperglycemic episodes: denies Feet problems: denies Blood Sugars averaging: Usually 123-130 but sometime 180 and 190.   BS after meal is 180-200 Eye exam within last year: 03/2015 and sees eye doctor regularly Last A1c:10.1 3 months ago Gets diabetic neuropathy and stable with Tramadol and Lyrica.  Will need refill next month   Hypertension:    Using medication without problems or lightheadedness No chest pain with exertion. No edema No shortness of breath Average home BPs: occasionally takes but unsure of value  Elevated Cholesterol: Using medications without problems No muscle aches  Diet compliance: eats toast, applesauce, bacon, boiled egg for breakfast, salads for lunch, pork chop or salmon with vegetables for dinner. Drinks a lot of water and sugar free powder flavors. Does not drink alcohol.  Exercise: Tries to walk 2 miles 2 days a week but not lately due to weight   Relevant past medical, surgical, family and social history reviewed and updated as indicated. Interim medical history since our last visit reviewed. Allergies and medications reviewed and updated.  Review of Systems  Constitutional: Negative.   HENT: Negative.   Eyes: Negative.    Respiratory: Negative.   Cardiovascular: Negative.   Gastrointestinal: Negative.   Endocrine: Negative.   Genitourinary: Negative.   Musculoskeletal: Positive for arthralgias.  Skin: Negative.   Allergic/Immunologic: Negative.   Neurological: Negative.   Hematological: Negative.   Psychiatric/Behavioral: Negative.     Per HPI unless specifically indicated above     Objective:    BP 138/83 mmHg  Pulse 97  Temp(Src) 97.5 F (36.4 C)  Ht 5' 3.7" (1.618 m)  Wt 190 lb 6.4 oz (86.365 kg)  BMI 32.99 kg/m2  SpO2 97%  LMP  (LMP Unknown)  Wt Readings from Last 3 Encounters:  10/03/15 190 lb 6.4 oz (86.365 kg)  08/18/15 180 lb (81.647 kg)  07/14/15 183 lb 12.8 oz (83.371 kg)    Physical Exam  Constitutional: She is oriented to person, place, and time. She appears well-developed and well-nourished. No distress.  HENT:  Head: Normocephalic and atraumatic.  Eyes: Conjunctivae and lids are normal. Right eye exhibits no discharge. Left eye exhibits no discharge. No scleral icterus.  Neck: Normal range of motion. Neck supple. No JVD present. Carotid bruit is not present.  Cardiovascular: Normal rate, regular rhythm and normal heart sounds.   Pulmonary/Chest: Effort normal and breath sounds normal.  Abdominal: Normal appearance. There is no splenomegaly or hepatomegaly.  Musculoskeletal: Normal range of motion.  Neurological: She is alert and oriented to person, place, and time.  Skin: Skin is warm, dry and intact. No rash noted. No pallor.  Psychiatric: She has a normal mood and affect. Her behavior is normal. Judgment and thought content normal.   Assessment & Plan:   Problem List Items Addressed This Visit      Unprioritized   Diabetes mellitus with chronic kidney disease ([place])    Poor control with Hgb A1C of 10.2.  Offered GLP1 or SGLT 2 and pt elected weekly injection.  Pt ed on side-effects.  Gave samples of .75 for 2 weeks and then increase to 1.5 for 2 week.  Recheck in 1  months      Relevant Medications   Dulaglutide (TRULICITY) 1.5 0000000 SOPN   Other Relevant Orders   Comprehensive metabolic panel   Bayer DCA Hb A1c Waived   Hypertension    Stable, continue present medications.        Peripheral neuropathy ([facility])    Stable on Tramadol and Lyrica      Type 2 diabetes mellitus with diabetic neuropathy, with long-term current use of insulin ([facility])   Relevant Medications   Dulaglutide (TRULICITY) 1.5 0000000 SOPN   Uncontrolled type 2 diabetes mellitus with chronic kidney disease ([facility])   Relevant Medications   Dulaglutide (TRULICITY) 1.5 0000000 SOPN    Other Visit Diagnoses    Health care maintenance    -  Primary    Relevant Orders    Hepatitis C antibody        Follow up plan: Return in about 4 weeks (around 10/31/2015).

## 2015-10-03 NOTE — Assessment & Plan Note (Signed)
Stable, continue present medications.   

## 2015-10-04 LAB — COMPREHENSIVE METABOLIC PANEL
A/G RATIO: 1.4 (ref 1.2–2.2)
ALK PHOS: 104 IU/L (ref 39–117)
ALT: 16 IU/L (ref 0–32)
AST: 15 IU/L (ref 0–40)
Albumin: 4 g/dL (ref 3.6–4.8)
BILIRUBIN TOTAL: 0.3 mg/dL (ref 0.0–1.2)
BUN/Creatinine Ratio: 14 (ref 12–28)
BUN: 15 mg/dL (ref 8–27)
CHLORIDE: 103 mmol/L (ref 96–106)
CO2: 25 mmol/L (ref 18–29)
Calcium: 9.7 mg/dL (ref 8.7–10.3)
Creatinine, Ser: 1.07 mg/dL — ABNORMAL HIGH (ref 0.57–1.00)
GFR calc Af Amer: 62 mL/min/{1.73_m2} (ref >59)
GFR calc non Af Amer: 54 mL/min/{1.73_m2} — ABNORMAL LOW (ref >59)
GLOBULIN, TOTAL: 2.8 g/dL (ref 1.5–4.5)
Glucose: 185 mg/dL — ABNORMAL HIGH (ref 65–99)
POTASSIUM: 4.5 mmol/L (ref 3.5–5.2)
SODIUM: 143 mmol/L (ref 134–144)
Total Protein: 6.8 g/dL (ref 6.0–8.5)

## 2015-10-04 LAB — HEPATITIS C ANTIBODY: HEP C VIRUS AB: 0.1 {s_co_ratio} (ref 0.0–0.9)

## 2015-10-08 ENCOUNTER — Other Ambulatory Visit: Payer: Self-pay | Admitting: Unknown Physician Specialty

## 2015-10-08 ENCOUNTER — Other Ambulatory Visit: Payer: Self-pay | Admitting: Family Medicine

## 2015-10-08 NOTE — Telephone Encounter (Signed)
Your patient 

## 2015-10-08 NOTE — Telephone Encounter (Signed)
LANTUS SOLOSTAR 100 UNIT/ML Solostar Pen Pharmacy:[id number] - [place], [place] - [address]  Patient needs refill on her medication sent over to her pharmacy, thanks.

## 2015-10-08 NOTE — Telephone Encounter (Signed)
Routing to provider. Malachy Mood, this medication has 2 different directions. Patient told me at her last appointment 10/03/15 that she was doing 50 units at bedtime.

## 2015-10-21 DIAGNOSIS — H1013 Acute atopic conjunctivitis, bilateral: Secondary | ICD-10-CM | POA: Diagnosis not present

## 2015-11-03 ENCOUNTER — Ambulatory Visit (INDEPENDENT_AMBULATORY_CARE_PROVIDER_SITE_OTHER): Payer: Medicare Other | Admitting: Unknown Physician Specialty

## 2015-11-03 ENCOUNTER — Encounter: Payer: Self-pay | Admitting: Unknown Physician Specialty

## 2015-11-03 VITALS — BP 128/80 | HR 101 | Temp 97.7°F | Ht 63.6 in | Wt 187.8 lb

## 2015-11-03 DIAGNOSIS — IMO0002 Reserved for concepts with insufficient information to code with codable children: Secondary | ICD-10-CM

## 2015-11-03 DIAGNOSIS — Z794 Long term (current) use of insulin: Secondary | ICD-10-CM | POA: Diagnosis not present

## 2015-11-03 DIAGNOSIS — E1122 Type 2 diabetes mellitus with diabetic chronic kidney disease: Secondary | ICD-10-CM | POA: Diagnosis not present

## 2015-11-03 DIAGNOSIS — E1165 Type 2 diabetes mellitus with hyperglycemia: Secondary | ICD-10-CM

## 2015-11-03 DIAGNOSIS — N183 Chronic kidney disease, stage 3 (moderate): Secondary | ICD-10-CM

## 2015-11-03 MED ORDER — DULAGLUTIDE 1.5 MG/0.5ML ~~LOC~~ SOAJ: 1.5000 mg | Freq: Every morning | SUBCUTANEOUS | Status: DC

## 2015-11-03 NOTE — Assessment & Plan Note (Signed)
Pt lost 3 pounds and seems to have good blood sugars.  Will hold on insulin and recheck in 2 months.  Continue trulicity

## 2015-11-03 NOTE — Progress Notes (Signed)
BP 128/80 mmHg  Pulse 101  Temp(Src) 97.7 F (36.5 C)  Ht 5' 3.6" (1.615 m)  Wt 187 lb 12.8 oz (85.186 kg)  BMI 32.66 kg/m2  SpO2 97%  LMP  (LMP Unknown)   Subjective:    Patient ID: Mary Parks, female    DOB: 1950-07-26, 65 y.o.   MRN: [id number]  HPI: Mary Parks is a 65 y.o. female  Chief Complaint  Patient presents with  . Diabetes    1 month f/u   Diabetes Takes Lantus 50 units.  States AM BP is 119-141 Taking Humalog 20 in the AM, 20 at lunch, and 30 in the evening.   Not Hypoglycemic episodes.   Trulicity has cut her appetite.  Miscommunication where I wanted her to increase Lantus as opposed to meal time insulin.   However not having episodes of hypoglycemia.   She has lost 3 pounds.    Relevant past medical, surgical, family and social history reviewed and updated as indicated. Interim medical history since our last visit reviewed. Allergies and medications reviewed and updated.  Review of Systems  Per HPI unless specifically indicated above     Objective:    BP 128/80 mmHg  Pulse 101  Temp(Src) 97.7 F (36.5 C)  Ht 5' 3.6" (1.615 m)  Wt 187 lb 12.8 oz (85.186 kg)  BMI 32.66 kg/m2  SpO2 97%  LMP  (LMP Unknown)  Wt Readings from Last 3 Encounters:  11/03/15 187 lb 12.8 oz (85.186 kg)  10/03/15 190 lb 6.4 oz (86.365 kg)  08/18/15 180 lb (81.647 kg)    Physical Exam  Constitutional: She is oriented to person, place, and time. She appears well-developed and well-nourished. No distress.  HENT:  Head: Normocephalic and atraumatic.  Eyes: Conjunctivae and lids are normal. Right eye exhibits no discharge. Left eye exhibits no discharge. No scleral icterus.  Cardiovascular: Normal rate.   Pulmonary/Chest: Effort normal.  Abdominal: Normal appearance. There is no splenomegaly or hepatomegaly.  Musculoskeletal: Normal range of motion.  Neurological: She is alert and oriented to person, place, and time.  Skin: Skin is intact. No rash noted. No  pallor.  Psychiatric: She has a normal mood and affect. Her behavior is normal. Judgment and thought content normal.    Results for orders placed or performed in visit on 10/03/15  Hepatitis C antibody  Result Value Ref Range   Hep C Virus Ab 0.1 0.0 - 0.9 s/co ratio  Comprehensive metabolic panel  Result Value Ref Range   Glucose 185 (H) 65 - 99 mg/dL   BUN 15 8 - 27 mg/dL   Creatinine, Ser 1.07 (H) 0.57 - 1.00 mg/dL   GFR calc non Af Amer 54 (L) >59 mL/min/1.73   GFR calc Af Amer 62 >59 mL/min/1.73   BUN/Creatinine Ratio 14 12 - 28   Sodium 143 134 - 144 mmol/L   Potassium 4.5 3.5 - 5.2 mmol/L   Chloride 103 96 - 106 mmol/L   CO2 25 18 - 29 mmol/L   Calcium 9.7 8.7 - 10.3 mg/dL   Total Protein 6.8 6.0 - 8.5 g/dL   Albumin 4.0 3.6 - 4.8 g/dL   Globulin, Total 2.8 1.5 - 4.5 g/dL   Albumin/Globulin Ratio 1.4 1.2 - 2.2   Bilirubin Total 0.3 0.0 - 1.2 mg/dL   Alkaline Phosphatase 104 39 - 117 IU/L   AST 15 0 - 40 IU/L   ALT 16 0 - 32 IU/L  Bayer DCA Hb A1c Waived  Result Value Ref Range   Bayer DCA Hb A1c Waived 10.2 (H) <7.0 %      Assessment & Plan:   Problem List Items Addressed This Visit      Unprioritized   Uncontrolled type 2 diabetes mellitus with chronic kidney disease ([place]) - Primary    Pt lost 3 pounds and seems to have good blood sugars.  Will hold on insulin and recheck in 2 months.  Continue trulicity      Relevant Medications   Dulaglutide (TRULICITY) 1.5 0000000 SOPN       Follow up plan: Return in about 2 months (around 01/03/2016).

## 2015-11-04 ENCOUNTER — Telehealth: Payer: Self-pay

## 2015-11-04 ENCOUNTER — Telehealth: Payer: Self-pay | Admitting: Unknown Physician Specialty

## 2015-11-04 MED ORDER — DULAGLUTIDE 1.5 MG/0.5ML ~~LOC~~ SOAJ: 1.5000 mg | Freq: Every morning | SUBCUTANEOUS | Status: DC

## 2015-11-04 MED ORDER — DULAGLUTIDE 1.5 MG/0.5ML ~~LOC~~ SOAJ: 1.5000 mg | SUBCUTANEOUS | Status: DC

## 2015-11-04 NOTE — Telephone Encounter (Signed)
Pharmacy sent a fax questioning directions on patient's trulicity. It was written for patient to inject daily and they want to know if this is correct or if it should be inject once weekly. If it is weekly, they need a new rx stating this.

## 2015-11-04 NOTE — Telephone Encounter (Signed)
Weekly, Rx written

## 2015-11-04 NOTE — Telephone Encounter (Signed)
Routing to provider. The rx in the chart says no print.

## 2015-11-04 NOTE — Telephone Encounter (Signed)
Pt called stated pharmacy does not have RX for Trulicity. Please resend RX to [facility] in [place]. Thanks.

## 2015-11-13 ENCOUNTER — Other Ambulatory Visit: Payer: Self-pay | Admitting: Unknown Physician Specialty

## 2015-11-14 DIAGNOSIS — H1013 Acute atopic conjunctivitis, bilateral: Secondary | ICD-10-CM | POA: Diagnosis not present

## 2015-11-17 LAB — HM DIABETES EYE EXAM

## 2015-12-15 ENCOUNTER — Encounter: Payer: Self-pay | Admitting: Family Medicine

## 2015-12-15 ENCOUNTER — Ambulatory Visit (INDEPENDENT_AMBULATORY_CARE_PROVIDER_SITE_OTHER): Payer: Medicare Other | Admitting: Family Medicine

## 2015-12-15 VITALS — BP 136/82 | HR 85 | Temp 98.3°F | Wt 188.0 lb

## 2015-12-15 DIAGNOSIS — I10 Essential (primary) hypertension: Secondary | ICD-10-CM

## 2015-12-15 DIAGNOSIS — G6289 Other specified polyneuropathies: Secondary | ICD-10-CM | POA: Diagnosis not present

## 2015-12-15 MED ORDER — LISINOPRIL 20 MG PO TABS: 20.0000 mg | ORAL_TABLET | Freq: Every day | ORAL | 1 refills | Status: DC

## 2015-12-15 MED ORDER — PREGABALIN 100 MG PO CAPS: 100.0000 mg | ORAL_CAPSULE | Freq: Every day | ORAL | 0 refills | Status: DC

## 2015-12-15 MED ORDER — FLUTICASONE PROPIONATE 50 MCG/ACT NA SUSP: 2.0000 | Freq: Every day | NASAL | 6 refills | Status: DC

## 2015-12-15 NOTE — Patient Instructions (Signed)
Follow up as scheduled, sooner if no relief.

## 2015-12-15 NOTE — Progress Notes (Signed)
BP 136/82 (BP Location: Right Arm)   Pulse 85   Temp 98.3 F (36.8 C)   Wt 188 lb (85.3 kg)   LMP  (LMP Unknown)   SpO2 99%   BMI 32.68 kg/m    Subjective:    Patient ID: Mary Parks, female    DOB: [birth year], 65 y.o.   MRN: [id number]  HPI: Mary Parks is a 65 y.o. female  Chief Complaint  Patient presents with  . Leg Pain    bilateral x few weeks. They feel "weak" when she gets up. Was on Lyrica previously which helped but she was taken off of it because they said she was on too many meds.  . Other    bilateral feet pain x few weeks.  . Hand Pain    finger joints hurt   Chronic issue, flaring the past couple of weeks. B/l extremities feel like pins and needles, and joints are stiff and feel like they're locking up. Hx of OA and neuropathy. Takes tylenol with good relief of pain, but the stiffness and tingling remain. Denies numbness, and only feels weak in the mornings.  Has been off Lyrica for about a year now in attempt to reduce amount of medications she is taking. Has been managing fine up until recently. Denies recent tick bites or illnesses.   Relevant past medical, surgical, family and social history reviewed and updated as indicated. Interim medical history since our last visit reviewed. Allergies and medications reviewed and updated.  Review of Systems  Constitutional: Negative.   HENT: Negative.   Eyes: Negative.   Respiratory: Negative.   Cardiovascular: Negative.   Gastrointestinal: Negative.   Musculoskeletal: Positive for arthralgias.       Joint stiffness  Neurological: Negative for numbness.       Tingling in extremities  Psychiatric/Behavioral: Negative.     Per HPI unless specifically indicated above     Objective:    BP 136/82 (BP Location: Right Arm)   Pulse 85   Temp 98.3 F (36.8 C)   Wt 188 lb (85.3 kg)   LMP  (LMP Unknown)   SpO2 99%   BMI 32.68 kg/m   Wt Readings from Last 3 Encounters:  12/15/15 188 lb (85.3 kg)    11/03/15 187 lb 12.8 oz (85.2 kg)  10/03/15 190 lb 6.4 oz (86.4 kg)    Physical Exam  Constitutional: She appears well-developed and well-nourished. No distress.  HENT:  Head: Atraumatic.  Eyes: Conjunctivae are normal. No scleral icterus.  Neck: Normal range of motion. Neck supple.  Cardiovascular: Normal rate and normal heart sounds.   Pulmonary/Chest: Effort normal. No respiratory distress.  Musculoskeletal: Normal range of motion. She exhibits no edema or tenderness.  Mild crepitus in b/l knees Strength full and intact b/l UE and LEs  Nursing note and vitals reviewed.   Results for orders placed or performed in visit on 12/12/15  Hemoglobin A1c  Result Value Ref Range   Hemoglobin A1C 10.2       Assessment & Plan:   Problem List Items Addressed This Visit      Cardiovascular and Mediastinum   Hypertension   Relevant Medications   lisinopril (PRINIVIL,ZESTRIL) 20 MG tablet     Nervous and Auditory   Peripheral neuropathy (HCC) - Primary   Relevant Medications   pregabalin (LYRICA) 100 MG capsule    Other Visit Diagnoses   None.   Restart lyrica, but only once daily. Continue tylenol for pain relief.  BP under good control. Continue current regimen. Refilled lisinopril.  Follow up plan: No Follow-up on file.

## 2015-12-15 NOTE — Addendum Note (Signed)
Addended by: Merrie Roof E on: 12/15/2015 09:31 AM   Modules accepted: Orders

## 2015-12-17 ENCOUNTER — Telehealth: Payer: Self-pay | Admitting: Unknown Physician Specialty

## 2015-12-17 NOTE — Telephone Encounter (Signed)
According to chart, lisinopril was refilled 2 days ago for 6 month supply. So I called the patient and let her know that it was refilled and asked for her to call her pharmacy. I asked for her to give [place] a call if there are any issues or concerns.

## 2016-01-02 DIAGNOSIS — E559 Vitamin D deficiency, unspecified: Secondary | ICD-10-CM | POA: Diagnosis not present

## 2016-01-02 DIAGNOSIS — E032 Hypothyroidism due to medicaments and other exogenous substances: Secondary | ICD-10-CM | POA: Diagnosis not present

## 2016-01-02 DIAGNOSIS — E1165 Type 2 diabetes mellitus with hyperglycemia: Secondary | ICD-10-CM | POA: Diagnosis not present

## 2016-01-02 DIAGNOSIS — N289 Disorder of kidney and ureter, unspecified: Secondary | ICD-10-CM | POA: Diagnosis not present

## 2016-01-02 DIAGNOSIS — E039 Hypothyroidism, unspecified: Secondary | ICD-10-CM | POA: Diagnosis not present

## 2016-01-05 ENCOUNTER — Encounter: Payer: Self-pay | Admitting: Unknown Physician Specialty

## 2016-01-05 ENCOUNTER — Other Ambulatory Visit: Payer: Self-pay

## 2016-01-05 ENCOUNTER — Ambulatory Visit (INDEPENDENT_AMBULATORY_CARE_PROVIDER_SITE_OTHER): Payer: Medicare Other | Admitting: Unknown Physician Specialty

## 2016-01-05 VITALS — BP 133/79 | HR 88 | Temp 97.3°F | Ht 65.3 in | Wt 188.6 lb

## 2016-01-05 DIAGNOSIS — E1165 Type 2 diabetes mellitus with hyperglycemia: Secondary | ICD-10-CM | POA: Diagnosis not present

## 2016-01-05 DIAGNOSIS — N183 Chronic kidney disease, stage 3 (moderate): Secondary | ICD-10-CM | POA: Diagnosis not present

## 2016-01-05 DIAGNOSIS — IMO0002 Reserved for concepts with insufficient information to code with codable children: Secondary | ICD-10-CM

## 2016-01-05 DIAGNOSIS — E1122 Type 2 diabetes mellitus with diabetic chronic kidney disease: Secondary | ICD-10-CM

## 2016-01-05 DIAGNOSIS — I10 Essential (primary) hypertension: Secondary | ICD-10-CM

## 2016-01-05 DIAGNOSIS — Z794 Long term (current) use of insulin: Secondary | ICD-10-CM | POA: Diagnosis not present

## 2016-01-05 DIAGNOSIS — M170 Bilateral primary osteoarthritis of knee: Secondary | ICD-10-CM | POA: Diagnosis not present

## 2016-01-05 LAB — BAYER DCA HB A1C WAIVED: HB A1C: 9.7 % — AB (ref <7.0)

## 2016-01-05 LAB — HEMOGLOBIN A1C: Hemoglobin A1C: 9.7

## 2016-01-05 NOTE — Patient Instructions (Signed)
Base your long acting insulin on your fasting (usually in the morning) blood sugar.  Increase long acting (daily insulin) 2 units if fasting blood sugar is greater than 120.  Decrease by 2 units if fasting blood sugar is less than 95.    

## 2016-01-05 NOTE — Assessment & Plan Note (Signed)
Not to goal at 9.7.  Increase Lantus 2 units nightlyy if AM blood sugars are greater than 120.  Written instructions given.  Do not go past 60 units

## 2016-01-05 NOTE — Progress Notes (Signed)
BP 133/79 (BP Location: Left Arm, Patient Position: Sitting, Cuff Size: Large)   Pulse 88   Temp 97.3 F (36.3 C)   Ht 5' 5.3" (1.659 m) Comment: pt had shoes on  Wt 188 lb 9.6 oz (85.5 kg) Comment: pt had shoes on  LMP  (LMP Unknown)   SpO2 97%   BMI 31.10 kg/m    Subjective:    Patient ID: Mary Parks, female    DOB: [birth year], 65 y.o.   MRN: [id number]  HPI: Mary Parks is a 65 y.o. female  Chief Complaint  Patient presents with  . Diabetes  . Knee Pain    pt states she would like right injected   Diabetes: Using medications without difficulties.  Taking Humalog 03/02/29.  Taking 50 units of Lantus.  Trulicity was added and she feels it helps No hypoglycemic episodes No hyperglycemic episodes Feet problems: none Blood Sugars averaging: 130 in the AM eye exam within last year Last Hgb A1C: 10.2  Hypertension  Using medications without difficulty Average home BPs   Using medication without problems or lightheadedness No chest pain with exertion or shortness of breath No Edema  Elevated Cholesterol Using medications without problems No Muscle aches  Diet: Working on low carbohydrates Exercise: none  Right knee pain Known OA bilateral knees.  Left one was done February or March.  She finds Lyrica helps with her shooting pain.  She would like a refill  Relevant past medical, surgical, family and social history reviewed and updated as indicated. Interim medical history since our last visit reviewed. Allergies and medications reviewed and updated.  Review of Systems  Per HPI unless specifically indicated above     Objective:    BP 133/79 (BP Location: Left Arm, Patient Position: Sitting, Cuff Size: Large)   Pulse 88   Temp 97.3 F (36.3 C)   Ht 5' 5.3" (1.659 m) Comment: pt had shoes on  Wt 188 lb 9.6 oz (85.5 kg) Comment: pt had shoes on  LMP  (LMP Unknown)   SpO2 97%   BMI 31.10 kg/m   Wt Readings from Last 3 Encounters:  01/05/16 188  lb 9.6 oz (85.5 kg)  12/15/15 188 lb (85.3 kg)  11/03/15 187 lb 12.8 oz (85.2 kg)    Physical Exam  Constitutional: She is oriented to person, place, and time. She appears well-developed and well-nourished. No distress.  HENT:  Head: Normocephalic and atraumatic.  Eyes: Conjunctivae and lids are normal. Right eye exhibits no discharge. Left eye exhibits no discharge. No scleral icterus.  Neck: Normal range of motion. Neck supple. No JVD present. Carotid bruit is not present.  Cardiovascular: Normal rate, regular rhythm and normal heart sounds.   Pulmonary/Chest: Effort normal and breath sounds normal.  Abdominal: Normal appearance. There is no splenomegaly or hepatomegaly.  Musculoskeletal: Normal range of motion.  Neurological: She is alert and oriented to person, place, and time.  Skin: Skin is warm, dry and intact. No rash noted. No pallor.  Psychiatric: She has a normal mood and affect. Her behavior is normal. Judgment and thought content normal.   STEROID INJECTION  Procedure: Knee Intraarticular Steroid Injection   Description: After verbal consent and patient ed on Area prepped and draped using  semi-sterile technique. Using a anterior  approach, a mixture of 4 cc of  1% Marcaine & 1 cc of Kenalog 40 was injected into knee joint.  A bandage was then placed over the injection site. Complications:  none Post  Procedure Instructions: To the ER if any symptoms of erythema or swelling.      Results for orders placed or performed in visit on 01/05/16  HM MAMMOGRAPHY  Result Value Ref Range   HM Mammogram 0-4 Bi-Rad 0-4 Bi-Rad, Self Reported Normal      Assessment & Plan:   Problem List Items Addressed This Visit      Unprioritized   Hypertension - Primary   Uncontrolled type 2 diabetes mellitus with chronic kidney disease ([place])    Not to goal at 9.7.  Increase Lantus 2 units nightlyy if AM blood sugars are greater than 120.  Written instructions given.  Do not go past 60  units      Relevant Orders   Comprehensive metabolic panel   Bayer DCA Hb A1c Waived    Other Visit Diagnoses    Primary osteoarthritis of both knees           Follow up plan: Return in about 5 weeks (around 02/09/2016).

## 2016-01-06 LAB — COMPREHENSIVE METABOLIC PANEL
ALBUMIN: 4 g/dL (ref 3.6–4.8)
ALK PHOS: 95 IU/L (ref 39–117)
ALT: 16 IU/L (ref 0–32)
AST: 16 IU/L (ref 0–40)
Albumin/Globulin Ratio: 1.4 (ref 1.2–2.2)
BILIRUBIN TOTAL: 0.4 mg/dL (ref 0.0–1.2)
BUN / CREAT RATIO: 11 — AB (ref 12–28)
BUN: 11 mg/dL (ref 8–27)
CHLORIDE: 105 mmol/L (ref 96–106)
CO2: 23 mmol/L (ref 18–29)
CREATININE: 0.97 mg/dL (ref 0.57–1.00)
Calcium: 9.2 mg/dL (ref 8.7–10.3)
GFR calc non Af Amer: 61 mL/min/{1.73_m2} (ref >59)
GFR, EST AFRICAN AMERICAN: 70 mL/min/{1.73_m2} (ref >59)
GLOBULIN, TOTAL: 2.8 g/dL (ref 1.5–4.5)
GLUCOSE: 156 mg/dL — AB (ref 65–99)
Potassium: 4.3 mmol/L (ref 3.5–5.2)
SODIUM: 144 mmol/L (ref 134–144)
TOTAL PROTEIN: 6.8 g/dL (ref 6.0–8.5)

## 2016-01-27 DIAGNOSIS — N182 Chronic kidney disease, stage 2 (mild): Secondary | ICD-10-CM | POA: Diagnosis not present

## 2016-01-28 ENCOUNTER — Other Ambulatory Visit: Payer: Self-pay | Admitting: Unknown Physician Specialty

## 2016-01-28 NOTE — Telephone Encounter (Signed)
Pt needs refill on pregabalin (LYRICA) 100 MG capsule sent to [facility]

## 2016-01-29 MED ORDER — PREGABALIN 100 MG PO CAPS: 100.0000 mg | ORAL_CAPSULE | Freq: Every day | ORAL | 0 refills | Status: DC

## 2016-02-03 ENCOUNTER — Other Ambulatory Visit: Payer: Self-pay | Admitting: Unknown Physician Specialty

## 2016-02-03 MED ORDER — MONTELUKAST SODIUM 10 MG PO TABS: 10.0000 mg | ORAL_TABLET | Freq: Every day | ORAL | 0 refills | Status: DC

## 2016-02-03 NOTE — Telephone Encounter (Signed)
Pt would like a refill on montelukast (SINGULAIR) 10 MG tablet sent to [facility]

## 2016-02-09 ENCOUNTER — Other Ambulatory Visit: Payer: Self-pay | Admitting: Unknown Physician Specialty

## 2016-02-09 NOTE — Telephone Encounter (Signed)
Your patient 

## 2016-02-13 ENCOUNTER — Encounter: Payer: Self-pay | Admitting: Unknown Physician Specialty

## 2016-02-13 ENCOUNTER — Ambulatory Visit (INDEPENDENT_AMBULATORY_CARE_PROVIDER_SITE_OTHER): Payer: Medicare Other | Admitting: Unknown Physician Specialty

## 2016-02-13 VITALS — BP 142/84 | HR 89 | Temp 97.6°F | Wt 186.8 lb

## 2016-02-13 DIAGNOSIS — E1140 Type 2 diabetes mellitus with diabetic neuropathy, unspecified: Secondary | ICD-10-CM | POA: Diagnosis not present

## 2016-02-13 DIAGNOSIS — Z794 Long term (current) use of insulin: Secondary | ICD-10-CM

## 2016-02-13 DIAGNOSIS — R6883 Chills (without fever): Secondary | ICD-10-CM | POA: Diagnosis not present

## 2016-02-13 DIAGNOSIS — N183 Chronic kidney disease, stage 3 (moderate): Secondary | ICD-10-CM

## 2016-02-13 DIAGNOSIS — E0822 Diabetes mellitus due to underlying condition with diabetic chronic kidney disease: Secondary | ICD-10-CM

## 2016-02-13 DIAGNOSIS — J069 Acute upper respiratory infection, unspecified: Secondary | ICD-10-CM | POA: Diagnosis not present

## 2016-02-13 LAB — CBC WITH DIFFERENTIAL/PLATELET
Hematocrit: 41 % (ref 34.0–46.6)
Hemoglobin: 12.9 g/dL (ref 11.1–15.9)
LYMPHS: 26 %
Lymphocytes Absolute: 1 10*3/uL (ref 0.7–3.1)
MCH: 27.4 pg (ref 26.6–33.0)
MCHC: 31.5 g/dL (ref 31.5–35.7)
MCV: 87 fL (ref 79–97)
MID (ABSOLUTE): 0.3 10*3/uL (ref 0.1–1.6)
MID: 8 %
NEUTROS ABS: 2.7 10*3/uL (ref 1.4–7.0)
Neutrophils: 66 %
PLATELETS: 198 10*3/uL (ref 150–379)
RBC: 4.7 x10E6/uL (ref 3.77–5.28)
RDW: 15.2 % (ref 12.3–15.4)
WBC: 4 10*3/uL (ref 3.4–10.8)

## 2016-02-13 LAB — GLUCOSE HEMOCUE WAIVED: GLU HEMOCUE WAIVED: 115 mg/dL — AB (ref 65–99)

## 2016-02-13 MED ORDER — AZITHROMYCIN 250 MG PO TABS: ORAL_TABLET | ORAL | 0 refills | Status: DC

## 2016-02-13 NOTE — Assessment & Plan Note (Signed)
Written instructions to increase Lantus given again.  But, BS 115 this AM

## 2016-02-13 NOTE — Progress Notes (Signed)
BP (!) 142/84 (BP Location: Left Arm, Cuff Size: Normal)   Pulse 89   Temp 97.6 F (36.4 C)   Wt 186 lb 12.8 oz (84.7 kg)   LMP  (LMP Unknown)   SpO2 98%   BMI 30.80 kg/m    Subjective:    Patient ID: Mary Parks, female    DOB: 26-Mar-1951, 65 y.o.   MRN: [id number]  HPI: Mary Parks is a 65 y.o. female  Chief Complaint  Patient presents with  . Diabetes  . Hypertension  . URI    pt states she has had cough, congestion, sore throat, ear pain, sinus pressure, eye pressure, and soreness from coughing. States symptoms started Tuesday.    Diabetes: Hgb a1C wal 9.7 last visit.  She had been on 50 units of Lantus and was instructed to increase.  Taking Humalog 20/20/30 with meals.  She has not increased it as she says she "forgot all about it."  Using medications without difficulties No hypoglycemic episodes No hyperglycemic episodes Feet problems:  Blood Sugars averaging: 135 in the AM eye exam within last year Last Hgb A1C:  URI   This is a new problem. The current episode started in the past 7 days. Associated symptoms include abdominal pain, congestion, ear pain, nausea, rhinorrhea and a sore throat. She has tried antihistamine for the symptoms. The treatment provided no relief.     Relevant past medical, surgical, family and social history reviewed and updated as indicated. Interim medical history since our last visit reviewed. Allergies and medications reviewed and updated.  Review of Systems  HENT: Positive for congestion, ear pain, rhinorrhea and sore throat.   Gastrointestinal: Positive for abdominal pain and nausea.    Per HPI unless specifically indicated above     Objective:    BP (!) 142/84 (BP Location: Left Arm, Cuff Size: Normal)   Pulse 89   Temp 97.6 F (36.4 C)   Wt 186 lb 12.8 oz (84.7 kg)   LMP  (LMP Unknown)   SpO2 98%   BMI 30.80 kg/m   Wt Readings from Last 3 Encounters:  02/13/16 186 lb 12.8 oz (84.7 kg)  01/05/16 188 lb 9.6  oz (85.5 kg)  12/15/15 188 lb (85.3 kg)    Physical Exam  Constitutional: She is oriented to person, place, and time. She appears well-developed and well-nourished. No distress.  HENT:  Head: Normocephalic and atraumatic.  Right Ear: Tympanic membrane and ear canal normal.  Left Ear: Tympanic membrane and ear canal normal.  Nose: Rhinorrhea present. Right sinus exhibits no maxillary sinus tenderness and no frontal sinus tenderness. Left sinus exhibits no maxillary sinus tenderness and no frontal sinus tenderness.  Mouth/Throat: Mucous membranes are normal. Posterior oropharyngeal erythema present.  Eyes: Conjunctivae and lids are normal. Right eye exhibits no discharge. Left eye exhibits no discharge. No scleral icterus.  Cardiovascular: Normal rate and regular rhythm.   Pulmonary/Chest: Effort normal and breath sounds normal. No respiratory distress.  Abdominal: Normal appearance. There is no splenomegaly or hepatomegaly.  Musculoskeletal: Normal range of motion.  Neurological: She is alert and oriented to person, place, and time.  Skin: Skin is intact. No rash noted. No pallor.  Psychiatric: She has a normal mood and affect. Her behavior is normal. Judgment and thought content normal.   CBC is normal  Results for orders placed or performed in visit on 02/13/16  CBC With Differential/Platelet  Result Value Ref Range   WBC 4.0 3.4 - 10.8 x10E3/uL  RBC 4.70 3.77 - 5.28 x10E6/uL   Hemoglobin 12.9 11.1 - 15.9 g/dL   Hematocrit 41.0 34.0 - 46.6 %   MCV 87 79 - 97 fL   MCH 27.4 26.6 - 33.0 pg   MCHC 31.5 31.5 - 35.7 g/dL   RDW 15.2 12.3 - 15.4 %   Platelets 198 150 - 379 x10E3/uL   Neutrophils 66 %   Lymphs 26 %   MID 8 %   Neutrophils Absolute 2.7 1.4 - 7.0 x10E3/uL   Lymphocytes Absolute 1.0 0.7 - 3.1 x10E3/uL   MID (Absolute) 0.3 0.1 - 1.6 X10E3/uL  Glucose Hemocue Waived  Result Value Ref Range   Glu Hemocue Waived 115 (H) 65 - 99 mg/dL      Assessment & Plan:   Problem  List Items Addressed This Visit      Unprioritized   Diabetes mellitus with chronic kidney disease ([place])   Relevant Orders   Glucose Hemocue Waived (Completed)   Type 2 diabetes mellitus with diabetic neuropathy, with long-term current use of insulin (HCC)    Written instructions to increase Lantus given again.  But, BS 115 this AM       Other Visit Diagnoses    Chills    -  Primary   Relevant Orders   CBC With Differential/Platelet (Completed)   URI (upper respiratory infection)       supportive care.  Pt is not happy and will rx Zpack if worse after 5 days or no improvement after 10 days   Relevant Medications   azithromycin (ZITHROMAX) 250 MG tablet       Follow up plan: Return in about 4 weeks (around 03/12/2016).

## 2016-02-13 NOTE — Patient Instructions (Addendum)
Upper Respiratory Infection, Pediatric An upper respiratory infection (URI) is an infection of the air passages that go to the lungs. The infection is caused by a type of germ called a virus. A URI affects the nose, throat, and upper air passages. The most common kind of URI is the common cold. HOME CARE   Give medicines only as told by your child's doctor. Do not give your child aspirin or anything with aspirin in it.  Talk to your child's doctor before giving your child new medicines.  Consider using saline nose drops to help with symptoms.  Consider giving your child a teaspoon of honey for a nighttime cough if your child is older than 24 months old.  Use a cool mist humidifier if you can. This will make it easier for your child to breathe. Do not use hot steam.  Have your child drink clear fluids if he or she is old enough. Have your child drink enough fluids to keep his or her pee (urine) clear or pale yellow.  Have your child rest as much as possible.  If your child has a fever, keep him or her home from day care or school until the fever is gone.  Your child may eat less than normal. This is okay as long as your child is drinking enough.  URIs can be passed from person to person (they are contagious). To keep your child's URI from spreading:  Wash your hands often or use alcohol-based antiviral gels. Tell your child and others to do the same.  Do not touch your hands to your mouth, face, eyes, or nose. Tell your child and others to do the same.  Teach your child to cough or sneeze into his or her sleeve or elbow instead of into his or her hand or a tissue.  Keep your child away from smoke.  Keep your child away from sick people.  Talk with your child's doctor about when your child can return to school or daycare. GET HELP IF:  Your child has a fever.  Your child's eyes are red and have a yellow discharge.  Your child's skin under the nose becomes crusted or scabbed  over.  Your child complains of a sore throat.  Your child develops a rash.  Your child complains of an earache or keeps pulling on his or her ear. GET HELP RIGHT AWAY IF:   Your child who is younger than 3 months has a fever of 100F (38C) or higher.  Your child has trouble breathing.  Your child's skin or nails look gray or blue.  Your child looks and acts sicker than before.  Your child has signs of water loss such as:  Unusual sleepiness.  Not acting like himself or herself.  Dry mouth.  Being very thirsty.  Little or no urination.  Wrinkled skin.  Dizziness.  No tears.  A sunken soft spot on the top of the head. MAKE SURE YOU:  Understand these instructions.  Will watch your child's condition.  Will get help right away if your child is not doing well or gets worse.   This information is not intended to replace advice given to you by your health care provider. Make sure you discuss any questions you have with your health care provider.   Document Released: 03/06/2009 Document Revised: 09/24/2014 Document Reviewed: 11/29/2012 Elsevier Interactive Patient Education 2016 [employer].   ------------------------------------------------------------------------ [facility] your long acting insulin on your fasting (usually in the morning) blood sugar.  Increase long acting (daily insulin) 2 units if fasting blood sugar is greater than 120.  Decrease by 2 units if fasting blood sugar is less than 95.

## 2016-03-19 ENCOUNTER — Ambulatory Visit
Admission: EM | Admit: 2016-03-19 | Discharge: 2016-03-19 | Discharge status: Absent without leave | Payer: Medicare Other

## 2016-03-20 ENCOUNTER — Encounter: Payer: Self-pay | Admitting: Gynecology

## 2016-03-20 ENCOUNTER — Ambulatory Visit
Admission: EM | Admit: 2016-03-20 | Discharge: 2016-03-20 | Disposition: A | Discharge status: Home / Self Care | Payer: Medicare Other | Attending: Family Medicine | Admitting: Family Medicine

## 2016-03-20 DIAGNOSIS — W57XXXA Bitten or stung by nonvenomous insect and other nonvenomous arthropods, initial encounter: Secondary | ICD-10-CM | POA: Diagnosis not present

## 2016-03-20 DIAGNOSIS — R21 Rash and other nonspecific skin eruption: Secondary | ICD-10-CM | POA: Diagnosis not present

## 2016-03-20 DIAGNOSIS — L309 Dermatitis, unspecified: Secondary | ICD-10-CM | POA: Diagnosis not present

## 2016-03-20 MED ORDER — LORATADINE 10 MG PO TABS: 10.0000 mg | ORAL_TABLET | Freq: Every day | ORAL | 0 refills | Status: DC

## 2016-03-20 MED ORDER — PREDNISONE 10 MG (21) PO TBPK: ORAL_TABLET | ORAL | 0 refills | Status: DC

## 2016-03-20 MED ORDER — MUPIROCIN 2 % EX OINT: TOPICAL_OINTMENT | CUTANEOUS | 0 refills | Status: DC

## 2016-03-20 MED ORDER — RANITIDINE HCL 150 MG PO CAPS: 150.0000 mg | ORAL_CAPSULE | Freq: Two times a day (BID) | ORAL | 0 refills | Status: DC

## 2016-03-20 NOTE — ED Provider Notes (Signed)
[facility]    CSN: [id number] Arrival date & time: 03/20/16  0803     History   Chief Complaint Chief Complaint  Patient presents with  . Rash  . Insect Bite    HPI Mary Parks is a 65 y.o. female.   Patient is here because her rash that has gone on for about a week. She states that the rash started after she Her niece's dog while she was on a cruise. She states that these insect bites she is seen some fleas in her house after the dog is left she's fumigate the house asked the rash has gotten worse and finally I was able to pull out from her that the rash has gotten. She does state that even though she's fumigated her house she has still noticed some fleas which would indicate that she's not fumigated her house properly or effectively. She states that the pruritus and itching has gotten worse and last night she was unable to rest because of the itching and scratching.   Smoking history she's has multiple medical problems hypertension heart disease diabetes and hypothyroidism. Post that she's never smoked and she is allergic to Celebrex and sulfur antibiotics and sulfa medications.   The history is provided by the patient. No language interpreter was used.  Rash  Location:  Leg, foot, hand and shoulder/arm Quality: bruising and itchiness   Severity:  Moderate Duration:  1 week Timing:  Constant Progression:  Worsening Chronicity:  New Context: animal contact and insect bite/sting   Relieved by:  Nothing Worsened by:  Nothing   Past Medical History:  Diagnosis Date  . Asthma   . Chest pain   . DM2 (diabetes mellitus, type 2) ([place]) 1987  . Dyspnea    Cath normal coronaries. normal EF. RA 11. PA 37/17 (26) LVEDP 36  . GERD (gastroesophageal reflux disease)   . HTN (hypertension)   . Hyperlipidemia   . Obesity   . Osteoarthritis    knees  . Renal disease   . Thyroid disease    having thyroid removed 04/14/15  . Vertigo    none recently     Patient Active Problem List   Diagnosis Date Noted  . Hypertension 10/03/2015  . Peripheral neuropathy ([place]) 10/03/2015  . Uncontrolled type 2 diabetes mellitus with chronic kidney disease ([place]) 10/03/2015  . Type 2 diabetes mellitus with diabetic neuropathy, with long-term current use of insulin ([place]) 10/03/2015  . S/P total thyroidectomy 04/14/2015  . Vertigo, intermittent 04/04/2015  . Chronic kidney disease, stage 3 12/17/2014  . Hypertension associated with chronic kidney disease due to type 2 diabetes mellitus ([place]) 12/17/2014  . Diabetes mellitus with chronic kidney disease ([place]) 11/28/2014  . COPD (chronic obstructive pulmonary disease) ([place]) 11/19/2014  . Allergic conjunctivitis 11/19/2014  . UNSPECIFIED ANEMIA 09/25/2008  . DYSPNEA 09/13/2008  . SNORING 09/13/2008    Past Surgical History:  Procedure Laterality Date  . CARDIAC CATHETERIZATION    . CATARACT EXTRACTION W/PHACO Right 03/31/2015   Procedure: CATARACT EXTRACTION PHACO AND INTRAOCULAR LENS PLACEMENT (IOC);  Surgeon: Ronnell Freshwater, MD;  Location: [place];  Service: Ophthalmology;  Laterality: Right;  DIABETIC - insulin  . EYE SURGERY    . HERNIA REPAIR     umbilical  . THYROIDECTOMY N/A 04/14/2015   Procedure: THYROIDECTOMY;  Surgeon: Margaretha Sheffield, MD;  Location: [facility];  Service: ENT;  Laterality: N/A;    OB History    No data available  Home Medications    Prior to Admission medications   Medication Sig Start Date End Date Taking? Authorizing Provider  amLODipine (NORVASC) 2.5 MG tablet TAKE ONE TABLET BY MOUTH ONCE DAILY 02/09/16  Yes Kathrine Haddock, NP  atorvastatin (LIPITOR) 40 MG tablet TAKE ONE TABLET BY MOUTH AT BEDTIME 02/09/16  Yes Kathrine Haddock, NP  budesonide-formoterol (SYMBICORT) 160-4.5 MCG/ACT inhaler Inhale 2 puffs into the lungs 2 (two) times daily.   Yes Historical Provider, MD  calcium-vitamin D (OSCAL WITH D) 500-200 MG-UNIT tablet Take 1 tablet by  mouth.   Yes Historical Provider, MD  Cholecalciferol (D 2000) 2000 UNITS TABS Take by mouth.   Yes Historical Provider, MD  Dulaglutide (TRULICITY) 1.5 0000000 SOPN Inject 1.5 mg into the skin once a week. 11/04/15  Yes Kathrine Haddock, NP  fluticasone (FLONASE) 50 MCG/ACT nasal spray Place 2 sprays into both nostrils daily. 12/15/15  Yes Volney American, PA-C  HUMALOG KWIKPEN 100 UNIT/ML KiwkPen INJECT 20 UNITS BEFORE BREAKFAST, 20 UNITS BEFORE LUNCH AND 30 UNITS BEFORE EVENING MEAL 04/14/15  Yes Kathrine Haddock, NP  LANTUS SOLOSTAR 100 UNIT/ML Solostar Pen INJECT 40-60 UNITS AT BEDTIME 10/08/15  Yes Kathrine Haddock, NP  levothyroxine (SYNTHROID, LEVOTHROID) 100 MCG tablet Take 100 mcg by mouth daily. 07/02/15  Yes Historical Provider, MD  lisinopril (PRINIVIL,ZESTRIL) 20 MG tablet Take 1 tablet (20 mg total) by mouth daily. 12/15/15  Yes Volney American, PA-C  montelukast (SINGULAIR) 10 MG tablet Take 1 tablet (10 mg total) by mouth daily. 02/03/16  Yes Megan P Johnson, DO  omeprazole (PRILOSEC) 20 MG capsule Take 1 capsule (20 mg total) by mouth daily. 09/11/15  Yes Kathrine Haddock, NP  ONE TOUCH ULTRA TEST test strip 1 each by Other route 2 (two) times daily.  03/15/15  Yes Historical Provider, MD  Jonetta Speak LANCETS [id number] MISC 2 (two) times daily.  03/15/15  Yes Historical Provider, MD  pregabalin (LYRICA) 100 MG capsule Take 1 capsule (100 mg total) by mouth daily. 01/29/16  Yes Volney American, PA-C  azithromycin [facility]) 250 MG tablet As directed 02/13/16   Kathrine Haddock, NP  loratadine (CLARITIN) 10 MG tablet Take 1 tablet (10 mg total) by mouth daily. For itching 03/20/16   Frederich Cha, MD  mupirocin ointment (BACTROBAN) 2 % Apply 2 to 3 x a day to prevent and treat infection 03/20/16   Frederich Cha, MD  predniSONE (STERAPRED UNI-PAK 21 TAB) 10 MG (21) TBPK tablet Sig 6 tablet day 1, 5 tablets day 2, 4 tablets day 3,,3tablets day 4, 2 tablets day 5, 1 tablet day 6 take all tablets  orally 03/20/16   Frederich Cha, MD  ranitidine (ZANTAC) 150 MG capsule Take 1 capsule (150 mg total) by mouth 2 (two) times daily. For itching 03/20/16   Frederich Cha, MD  traMADol (ULTRAM) 50 MG tablet Take 50-100 mg by mouth every 6 (six) hours as needed. Reported on 10/03/2015    Historical Provider, MD    Family History Family History  Problem Relation Age of Onset  . Diabetes Mother   . Diabetes Father   . Diabetes Sister   . Diabetes Brother   . Diabetes Sister   . Diabetes Sister   . Diabetes Maternal Grandmother   . Diabetes Maternal Grandfather   . Diabetes Paternal Grandmother   . Diabetes Paternal Grandfather   . Heart disease Neg Hx   . Coronary artery disease Neg Hx     Social History Social History  Substance Use  Topics  . Smoking status: Never Smoker  . Smokeless tobacco: Never Used  . Alcohol use No     Allergies   Celebrex [celecoxib]; Sulfa antibiotics; Sulfisoxazole; and Sulfur   Review of Systems Review of Systems  Constitutional: Negative for activity change.  Skin: Positive for rash.  All other systems reviewed and are negative.    Physical Exam Triage Vital Signs ED Triage Vitals  Enc Vitals Group     BP 03/20/16 0826 133/80     Pulse Rate 03/20/16 0826 88     Resp 03/20/16 0826 16     Temp 03/20/16 0826 98 F (36.7 C)     Temp Source 03/20/16 0826 Oral     SpO2 03/20/16 0826 100 %     Weight 03/20/16 0828 180 lb (81.6 kg)     Height --      Head Circumference --      Peak Flow --      Pain Score 03/20/16 0830 2     Pain Loc --      Pain Edu? --      Excl. in [place]? --    No data found.   Updated Vital Signs BP 133/80 (BP Location: Left Arm)   Pulse 88   Temp 98 F (36.7 C) (Oral)   Resp 16   Wt 180 lb (81.6 kg)   LMP  (LMP Unknown)   SpO2 100%   BMI 29.68 kg/m   Visual Acuity Right Eye Distance:   Left Eye Distance:   Bilateral Distance:    Right Eye Near:   Left Eye Near:    Bilateral Near:     Physical Exam   Constitutional: She is oriented to person, place, and time. She appears well-developed and well-nourished.  HENT:  Head: Normocephalic and atraumatic.  Eyes: EOM are normal. Pupils are equal, round, and reactive to light.  Neck: Normal range of motion.  Pulmonary/Chest: Effort normal.  Musculoskeletal: Normal range of motion.  Neurological: She is alert and oriented to person, place, and time.  Skin: Skin is warm. Lesion and rash noted. There is erythema.     Psychiatric: Her mood appears anxious.     UC Treatments / Results  Labs (all labs ordered are listed, but only abnormal results are displayed) Labs Reviewed - No data to display  EKG  EKG Interpretation None       Radiology No results found.  Procedures Procedures (including critical care time)  Medications Ordered in UC Medications - No data to display   Initial Impression / Assessment and Plan / UC Course  I have reviewed the triage vital signs and the nursing notes.  Pertinent labs & imaging results that were available during my care of the patient were reviewed by me and considered in my medical decision making (see chart for details).  Clinical Course   Patient lesions could be multiple insect bites but I'm suspicious with amount of scratching and irritation and inflammation and the pattern of the bites that this could be pediculosis or a scabies infection. The minute though I mentioned about pediculosis/scabies patient became very defensive and seemed to be insulted that I did not think she was as she put it clean. At that point time since she has seen fleas and is convinced that this are flea bites will treat her for flea bites. Recommend 6 take course of prednisone and Claritin and Zantac for the itching combined together. She has some concern about using prednisone since  she states that prednisone has can cause some confusion mentally with her before. Explained to her that if high doses of prednisone  causes a problem she is more than willing to only use 4 tablets instead of 6 and take 4 tablets safe for 2 days and then maybe 3 for 2 days and 2 for 2 days stiffness [id number].normal prescribed.   Final Clinical Impressions(s) / UC Diagnoses   Final diagnoses:  Insect bite, initial encounter    New Prescriptions Discharge Medication List as of 03/20/2016  8:47 AM    START taking these medications   Details  loratadine (CLARITIN) 10 MG tablet Take 1 tablet (10 mg total) by mouth daily. For itching, Starting Sat 03/20/2016, Normal    mupirocin ointment (BACTROBAN) 2 % Apply 2 to 3 x a day to prevent and treat infection, Normal    predniSONE (STERAPRED UNI-PAK 21 TAB) 10 MG (21) TBPK tablet Sig 6 tablet day 1, 5 tablets day 2, 4 tablets day 3,,3tablets day 4, 2 tablets day 5, 1 tablet day 6 take all tablets orally, Normal    ranitidine (ZANTAC) 150 MG capsule Take 1 capsule (150 mg total) by mouth 2 (two) times daily. For itching, Starting Sat 03/20/2016, Normal       When the CNA discharged her she expressed some consternation that placed her on Claritin and Zantac since those do not use for pruritus. The CNA stated that she explained to her the best but those medicines were for. The Bactroban is to prevent infection. I'll see if this rash does not improve she'll need to follow up here or with her PCP     Frederich Cha, MD 03/20/16 1050

## 2016-03-20 NOTE — ED Triage Notes (Signed)
Patient c/o rash on body x 1 week. Per patient c/o insect bites.

## 2016-03-27 ENCOUNTER — Ambulatory Visit
Admission: EM | Admit: 2016-03-27 | Discharge: 2016-03-27 | Disposition: A | Discharge status: Home / Self Care | Payer: Medicare Other | Attending: Family Medicine | Admitting: Family Medicine

## 2016-03-27 ENCOUNTER — Encounter: Payer: Self-pay | Admitting: Gynecology

## 2016-03-27 DIAGNOSIS — M940 Chondrocostal junction syndrome [Tietze]: Secondary | ICD-10-CM | POA: Diagnosis not present

## 2016-03-27 DIAGNOSIS — R0789 Other chest pain: Secondary | ICD-10-CM | POA: Insufficient documentation

## 2016-03-27 DIAGNOSIS — K219 Gastro-esophageal reflux disease without esophagitis: Secondary | ICD-10-CM | POA: Diagnosis not present

## 2016-03-27 MED ORDER — TRAMADOL HCL 50 MG PO TABS: 50.0000 mg | ORAL_TABLET | Freq: Four times a day (QID) | ORAL | 0 refills | Status: DC | PRN

## 2016-03-27 NOTE — ED Triage Notes (Signed)
Patient c/o breast bone pain rotate to her back. Per patient question having GERD.

## 2016-04-09 DIAGNOSIS — I10 Essential (primary) hypertension: Secondary | ICD-10-CM | POA: Diagnosis not present

## 2016-04-09 DIAGNOSIS — E1165 Type 2 diabetes mellitus with hyperglycemia: Secondary | ICD-10-CM | POA: Diagnosis not present

## 2016-04-09 DIAGNOSIS — E049 Nontoxic goiter, unspecified: Secondary | ICD-10-CM | POA: Diagnosis not present

## 2016-04-09 DIAGNOSIS — E781 Pure hyperglyceridemia: Secondary | ICD-10-CM | POA: Diagnosis not present

## 2016-04-09 DIAGNOSIS — I158 Other secondary hypertension: Secondary | ICD-10-CM | POA: Diagnosis not present

## 2016-04-09 DIAGNOSIS — E032 Hypothyroidism due to medicaments and other exogenous substances: Secondary | ICD-10-CM | POA: Diagnosis not present

## 2016-04-09 DIAGNOSIS — E784 Other hyperlipidemia: Secondary | ICD-10-CM | POA: Diagnosis not present

## 2016-05-02 ENCOUNTER — Other Ambulatory Visit: Payer: Self-pay | Admitting: Unknown Physician Specialty

## 2016-05-19 ENCOUNTER — Other Ambulatory Visit: Payer: Self-pay | Admitting: Unknown Physician Specialty

## 2016-06-04 DIAGNOSIS — E032 Hypothyroidism due to medicaments and other exogenous substances: Secondary | ICD-10-CM | POA: Diagnosis not present

## 2016-06-04 DIAGNOSIS — K210 Gastro-esophageal reflux disease with esophagitis: Secondary | ICD-10-CM | POA: Diagnosis not present

## 2016-06-04 DIAGNOSIS — E039 Hypothyroidism, unspecified: Secondary | ICD-10-CM | POA: Diagnosis not present

## 2016-06-04 DIAGNOSIS — K219 Gastro-esophageal reflux disease without esophagitis: Secondary | ICD-10-CM | POA: Diagnosis not present

## 2016-06-04 DIAGNOSIS — R079 Chest pain, unspecified: Secondary | ICD-10-CM | POA: Diagnosis not present

## 2016-06-16 ENCOUNTER — Other Ambulatory Visit: Payer: Self-pay | Admitting: Unknown Physician Specialty

## 2016-06-16 ENCOUNTER — Other Ambulatory Visit: Payer: Self-pay | Admitting: Family Medicine

## 2016-06-16 NOTE — Telephone Encounter (Signed)
Routing to provider  

## 2016-06-16 NOTE — Telephone Encounter (Signed)
Your patient 

## 2016-06-16 NOTE — ED Provider Notes (Signed)
[facility]    CSN: [id number] Arrival date & time: 03/27/16  1216     History   Chief Complaint Chief Complaint  Patient presents with  . Gastroesophageal Reflux    HPI Mary Parks is a 66 y.o. female.   The history is provided by the patient.  Gastroesophageal Reflux  This is a new problem. The current episode started 12 to 24 hours ago. The problem occurs constantly. The problem has not changed since onset.Associated symptoms include chest pain. Pertinent negatives include no abdominal pain, no headaches and no shortness of breath.  Chest Pain  Pain location:  Epigastric Pain quality: burning   Pain radiates to:  Does not radiate Pain severity:  Mild Onset quality:  Gradual Duration:  1 day Timing:  Constant Progression:  Unchanged Chronicity:  New Context: movement   Context: not breathing   Worsened by:  Certain positions and movement Ineffective treatments:  None tried Associated symptoms: heartburn   Associated symptoms: no abdominal pain, no altered mental status, no back pain, no cough, no diaphoresis, no dizziness, no dysphagia, no fatigue, no fever, no headache, no lower extremity edema, no nausea, no near-syncope, no numbness, no orthopnea, no palpitations, no PND, no shortness of breath, no syncope and no vomiting     Past Medical History:  Diagnosis Date  . Asthma   . Chest pain   . DM2 (diabetes mellitus, type 2) ([place]) 1987  . Dyspnea    Cath normal coronaries. normal EF. RA 11. PA 37/17 (26) LVEDP 36  . GERD (gastroesophageal reflux disease)   . HTN (hypertension)   . Hyperlipidemia   . Obesity   . Osteoarthritis    knees  . Renal disease   . Thyroid disease    having thyroid removed 04/14/15  . Vertigo    none recently    Patient Active Problem List   Diagnosis Date Noted  . Hypertension 10/03/2015  . Peripheral neuropathy ([place]) 10/03/2015  . Uncontrolled type 2 diabetes mellitus with chronic kidney disease ([place])  10/03/2015  . Type 2 diabetes mellitus with diabetic neuropathy, with long-term current use of insulin ([place]) 10/03/2015  . S/P total thyroidectomy 04/14/2015  . Vertigo, intermittent 04/04/2015  . Chronic kidney disease, stage 3 12/17/2014  . Hypertension associated with chronic kidney disease due to type 2 diabetes mellitus ([place]) 12/17/2014  . Diabetes mellitus with chronic kidney disease ([place]) 11/28/2014  . COPD (chronic obstructive pulmonary disease) ([place]) 11/19/2014  . Allergic conjunctivitis 11/19/2014  . UNSPECIFIED ANEMIA 09/25/2008  . DYSPNEA 09/13/2008  . SNORING 09/13/2008    Past Surgical History:  Procedure Laterality Date  . CARDIAC CATHETERIZATION    . CATARACT EXTRACTION W/PHACO Right 03/31/2015   Procedure: CATARACT EXTRACTION PHACO AND INTRAOCULAR LENS PLACEMENT (IOC);  Surgeon: Ronnell Freshwater, MD;  Location: [place];  Service: Ophthalmology;  Laterality: Right;  DIABETIC - insulin  . EYE SURGERY    . HERNIA REPAIR     umbilical  . THYROIDECTOMY N/A 04/14/2015   Procedure: THYROIDECTOMY;  Surgeon: Margaretha Sheffield, MD;  Location: [facility];  Service: ENT;  Laterality: N/A;    OB History    No data available       Home Medications    Prior to Admission medications   Medication Sig Start Date End Date Taking? Authorizing Provider  azithromycin (ZITHROMAX) 250 MG tablet As directed 02/13/16  Yes Kathrine Haddock, NP  budesonide-formoterol (SYMBICORT) 160-4.5 MCG/ACT inhaler Inhale 2 puffs into the lungs 2 (two)  times daily.   Yes Historical Provider, MD  calcium-vitamin D (OSCAL WITH D) 500-200 MG-UNIT tablet Take 1 tablet by mouth.   Yes Historical Provider, MD  Cholecalciferol (D 2000) 2000 UNITS TABS Take by mouth.   Yes Historical Provider, MD  Dulaglutide (TRULICITY) 1.5 0000000 SOPN Inject 1.5 mg into the skin once a week. 11/04/15  Yes Kathrine Haddock, NP  fluticasone (FLONASE) 50 MCG/ACT nasal spray Place 2 sprays into both nostrils daily.  12/15/15  Yes Volney American, PA-C  HUMALOG KWIKPEN 100 UNIT/ML KiwkPen INJECT 20 UNITS BEFORE BREAKFAST, 20 UNITS BEFORE LUNCH AND 30 UNITS BEFORE EVENING MEAL 04/14/15  Yes Kathrine Haddock, NP  LANTUS SOLOSTAR 100 UNIT/ML Solostar Pen INJECT 40-60 UNITS AT BEDTIME 10/08/15  Yes Kathrine Haddock, NP  levothyroxine (SYNTHROID, LEVOTHROID) 100 MCG tablet Take 100 mcg by mouth daily. 07/02/15  Yes Historical Provider, MD  lisinopril (PRINIVIL,ZESTRIL) 20 MG tablet Take 1 tablet (20 mg total) by mouth daily. 12/15/15  Yes Volney American, PA-C  loratadine (CLARITIN) 10 MG tablet Take 1 tablet (10 mg total) by mouth daily. For itching 03/20/16  Yes Frederich Cha, MD  montelukast (SINGULAIR) 10 MG tablet Take 1 tablet (10 mg total) by mouth daily. 02/03/16  Yes Megan P Johnson, DO  mupirocin ointment (BACTROBAN) 2 % Apply 2 to 3 x a day to prevent and treat infection 03/20/16  Yes Frederich Cha, MD  omeprazole (PRILOSEC) 20 MG capsule Take 1 capsule (20 mg total) by mouth daily. 09/11/15  Yes Kathrine Haddock, NP  ONE TOUCH ULTRA TEST test strip 1 each by Other route 2 (two) times daily.  03/15/15  Yes Historical Provider, MD  Jonetta Speak LANCETS [id number] MISC 2 (two) times daily.  03/15/15  Yes Historical Provider, MD  pregabalin (LYRICA) 100 MG capsule Take 1 capsule (100 mg total) by mouth daily. 01/29/16  Yes Volney American, PA-C  ranitidine (ZANTAC) 150 MG capsule Take 1 capsule (150 mg total) by mouth 2 (two) times daily. For itching 03/20/16  Yes Frederich Cha, MD  amLODipine (NORVASC) 2.5 MG tablet TAKE ONE TABLET BY MOUTH ONCE DAILY 05/03/16   Kathrine Haddock, NP  atorvastatin (LIPITOR) 40 MG tablet TAKE ONE TABLET BY MOUTH AT BEDTIME 05/20/16   Megan P Johnson, DO  predniSONE (STERAPRED UNI-PAK 21 TAB) 10 MG (21) TBPK tablet Sig 6 tablet day 1, 5 tablets day 2, 4 tablets day 3,,3tablets day 4, 2 tablets day 5, 1 tablet day 6 take all tablets orally 03/20/16   Frederich Cha, MD  traMADol (ULTRAM) 50 MG  tablet Take 1 tablet (50 mg total) by mouth every 6 (six) hours as needed. 03/27/16   Norval Gable, MD    Family History Family History  Problem Relation Age of Onset  . Diabetes Mother   . Diabetes Father   . Diabetes Sister   . Diabetes Brother   . Diabetes Sister   . Diabetes Sister   . Diabetes Maternal Grandmother   . Diabetes Maternal Grandfather   . Diabetes Paternal Grandmother   . Diabetes Paternal Grandfather   . Heart disease Neg Hx   . Coronary artery disease Neg Hx     Social History Social History  Substance Use Topics  . Smoking status: Never Smoker  . Smokeless tobacco: Never Used  . Alcohol use No     Allergies   Celebrex [celecoxib]; Sulfa antibiotics; Sulfisoxazole; and Sulfur   Review of Systems Review of Systems  Constitutional: Negative for diaphoresis, fatigue and  fever.  HENT: Negative for trouble swallowing.   Respiratory: Negative for cough and shortness of breath.   Cardiovascular: Positive for chest pain. Negative for palpitations, orthopnea, syncope, PND and near-syncope.  Gastrointestinal: Positive for heartburn. Negative for abdominal pain, nausea and vomiting.  Musculoskeletal: Negative for back pain.  Neurological: Negative for dizziness, numbness and headaches.     Physical Exam Triage Vital Signs ED Triage Vitals  Enc Vitals Group     BP 03/27/16 1321 (!) 142/77     Pulse Rate 03/27/16 1321 100     Resp 03/27/16 1321 16     Temp 03/27/16 1321 98.2 F (36.8 C)     Temp Source 03/27/16 1321 Oral     SpO2 03/27/16 1321 98 %     Weight 03/27/16 1323 180 lb (81.6 kg)     Height --      Head Circumference --      Peak Flow --      Pain Score 03/27/16 1326 7     Pain Loc --      Pain Edu? --      Excl. in [place]? --    No data found.   Updated Vital Signs BP (!) 142/77 (BP Location: Left Arm)   Pulse 100   Temp 98.2 F (36.8 C) (Oral)   Resp 16   Wt 180 lb (81.6 kg)   LMP  (LMP Unknown)   SpO2 98%   BMI 29.68 kg/m     Visual Acuity Right Eye Distance:   Left Eye Distance:   Bilateral Distance:    Right Eye Near:   Left Eye Near:    Bilateral Near:     Physical Exam  Constitutional: She appears well-developed and well-nourished. No distress.  HENT:  Head: Normocephalic and atraumatic.  Right Ear: Tympanic membrane, external ear and ear canal normal.  Left Ear: Tympanic membrane, external ear and ear canal normal.  Nose: No mucosal edema, rhinorrhea, nose lacerations, sinus tenderness, nasal deformity, septal deviation or nasal septal hematoma. No epistaxis.  No foreign bodies. Right sinus exhibits no maxillary sinus tenderness and no frontal sinus tenderness. Left sinus exhibits no maxillary sinus tenderness and no frontal sinus tenderness.  Mouth/Throat: Uvula is midline, oropharynx is clear and moist and mucous membranes are normal. No oropharyngeal exudate.  Eyes: Conjunctivae and EOM are normal. Pupils are equal, round, and reactive to light. Right eye exhibits no discharge. Left eye exhibits no discharge. No scleral icterus.  Neck: Normal range of motion. Neck supple. No thyromegaly present.  Cardiovascular: Normal rate, regular rhythm and normal heart sounds.   Pulmonary/Chest: Effort normal and breath sounds normal. No respiratory distress. She has no wheezes. She has no rales. She exhibits tenderness (reproducible).  Abdominal: Soft. Bowel sounds are normal. She exhibits no distension and no mass. There is no tenderness. There is no rebound and no guarding. No hernia.  Lymphadenopathy:    She has no cervical adenopathy.  Skin: She is not diaphoretic.  Nursing note and vitals reviewed.    UC Treatments / Results  Labs (all labs ordered are listed, but only abnormal results are displayed) Labs Reviewed - No data to display  EKG  EKG Interpretation None       Radiology No results found.  Procedures ED EKG Date/Time: 06/16/2016 9:28 AM Performed by: Norval Gable Authorized  by: Norval Gable   ECG reviewed by ED Physician in the absence of a cardiologist: yes   Previous ECG:    Previous  ECG:  Compared to current   Similarity:  No change Interpretation:    Interpretation: normal   Rate:    ECG rate assessment: tachycardic   Rhythm:    Rhythm: sinus tachycardia   Ectopy:    Ectopy: none   QRS:    QRS axis:  Normal Conduction:    Conduction: normal   ST segments:    ST segments:  Normal T waves:    T waves: normal     (including critical care time)  Medications Ordered in UC Medications - No data to display   Initial Impression / Assessment and Plan / UC Course  I have reviewed the triage vital signs and the nursing notes.  Pertinent labs & imaging results that were available during my care of the patient were reviewed by me and considered in my medical decision making (see chart for details).       Final Clinical Impressions(s) / UC Diagnoses   Final diagnoses:  Costochondritis  Gastroesophageal reflux disease, esophagitis presence not specified    New Prescriptions Discharge Medication List as of 03/27/2016  3:11 PM     1. ekg results and diagnosis reviewed with patient 2. rx as per orders above; reviewed possible side effects, interactions, risks and benefits  3. Recommend supportive treatment with otc H2 blocker/PPI 4. Follow-up prn if symptoms worsen or don't improve   Norval Gable, MD 06/16/16 [phone]

## 2016-06-18 DIAGNOSIS — E039 Hypothyroidism, unspecified: Secondary | ICD-10-CM | POA: Diagnosis not present

## 2016-06-23 DIAGNOSIS — R079 Chest pain, unspecified: Secondary | ICD-10-CM | POA: Diagnosis not present

## 2016-06-25 ENCOUNTER — Encounter: Payer: Self-pay | Admitting: Unknown Physician Specialty

## 2016-06-25 ENCOUNTER — Ambulatory Visit (INDEPENDENT_AMBULATORY_CARE_PROVIDER_SITE_OTHER): Payer: Medicare Other | Admitting: Unknown Physician Specialty

## 2016-06-25 VITALS — BP 132/83 | HR 103 | Temp 97.6°F | Wt 184.8 lb

## 2016-06-25 DIAGNOSIS — M170 Bilateral primary osteoarthritis of knee: Secondary | ICD-10-CM | POA: Diagnosis not present

## 2016-06-25 DIAGNOSIS — E1165 Type 2 diabetes mellitus with hyperglycemia: Secondary | ICD-10-CM | POA: Diagnosis not present

## 2016-06-25 DIAGNOSIS — N183 Chronic kidney disease, stage 3 (moderate): Secondary | ICD-10-CM

## 2016-06-25 DIAGNOSIS — E1122 Type 2 diabetes mellitus with diabetic chronic kidney disease: Secondary | ICD-10-CM | POA: Diagnosis not present

## 2016-06-25 DIAGNOSIS — M179 Osteoarthritis of knee, unspecified: Secondary | ICD-10-CM | POA: Insufficient documentation

## 2016-06-25 DIAGNOSIS — E0822 Diabetes mellitus due to underlying condition with diabetic chronic kidney disease: Secondary | ICD-10-CM | POA: Diagnosis not present

## 2016-06-25 DIAGNOSIS — Z794 Long term (current) use of insulin: Secondary | ICD-10-CM | POA: Diagnosis not present

## 2016-06-25 DIAGNOSIS — N189 Chronic kidney disease, unspecified: Secondary | ICD-10-CM | POA: Diagnosis not present

## 2016-06-25 DIAGNOSIS — I129 Hypertensive chronic kidney disease with stage 1 through stage 4 chronic kidney disease, or unspecified chronic kidney disease: Secondary | ICD-10-CM

## 2016-06-25 DIAGNOSIS — IMO0002 Reserved for concepts with insufficient information to code with codable children: Secondary | ICD-10-CM

## 2016-06-25 DIAGNOSIS — M1710 Unilateral primary osteoarthritis, unspecified knee: Secondary | ICD-10-CM | POA: Insufficient documentation

## 2016-06-25 LAB — BAYER DCA HB A1C WAIVED: HB A1C: 9.4 % — AB (ref <7.0)

## 2016-06-25 MED ORDER — OMEGA-3-ACID ETHYL ESTERS 1 G PO CAPS: 2.0000 g | ORAL_CAPSULE | Freq: Two times a day (BID) | ORAL | 12 refills | Status: DC

## 2016-06-25 MED ORDER — PREGABALIN 100 MG PO CAPS: 100.0000 mg | ORAL_CAPSULE | Freq: Every day | ORAL | 12 refills | Status: DC

## 2016-06-25 MED ORDER — FLUTICASONE PROPIONATE 50 MCG/ACT NA SUSP: 2.0000 | Freq: Every day | NASAL | 6 refills | Status: DC

## 2016-06-25 MED ORDER — BUDESONIDE-FORMOTEROL FUMARATE 160-4.5 MCG/ACT IN AERO: 2.0000 | INHALATION_SPRAY | Freq: Two times a day (BID) | RESPIRATORY_TRACT | 4 refills | Status: DC

## 2016-06-25 NOTE — Patient Instructions (Signed)
[facility] [employer]

## 2016-06-25 NOTE — Assessment & Plan Note (Signed)
Stable, continue present medications.   

## 2016-06-25 NOTE — Progress Notes (Signed)
BP 132/83 (BP Location: Left Arm, Cuff Size: Large)   Pulse (!) 103   Temp 97.6 F (36.4 C)   Wt 184 lb 12.8 oz (83.8 kg)   LMP  (LMP Unknown)   SpO2 98%   BMI 30.47 kg/m    Subjective:    Patient ID: Mary Parks, female    DOB: [birth year], 66 y.o.   MRN: [id number]  HPI: Mary Parks is a 66 y.o. female  Chief Complaint  Patient presents with  . Knee Pain    pt states she has been having bilateral knee pain for years, but states it has gotten worse since Thanksgiving   . Medication Refill    pt states she needs refills on flonase, symbicort, and lyrica    Diabetes Lost to f/u.  Taking Humalog 20/20/30 and Lantus 50 units daily.  AM BS is 90-120, mid-day 170, and around 160 QHS.    Knee OA Pt with well known bilateral knee OA.  Cortisone injections help and she would like to have one in both knees today.    Hypertension Using medications without difficulty Average home BPs Not checking   No problems or lightheadedness No chest pain with exertion or shortness of breath No Edema    Relevant past medical, surgical, family and social history reviewed and updated as indicated. Interim medical history since our last visit reviewed. Allergies and medications reviewed and updated.  Review of Systems  Per HPI unless specifically indicated above     Objective:    BP 132/83 (BP Location: Left Arm, Cuff Size: Large)   Pulse (!) 103   Temp 97.6 F (36.4 C)   Wt 184 lb 12.8 oz (83.8 kg)   LMP  (LMP Unknown)   SpO2 98%   BMI 30.47 kg/m   Wt Readings from Last 3 Encounters:  06/25/16 184 lb 12.8 oz (83.8 kg)  03/27/16 180 lb (81.6 kg)  03/20/16 180 lb (81.6 kg)    Physical Exam  Constitutional: She is oriented to person, place, and time. She appears well-developed and well-nourished. No distress.  HENT:  Head: Normocephalic and atraumatic.  Eyes: Conjunctivae and lids are normal. Right eye exhibits no discharge. Left eye exhibits no discharge. No scleral  icterus.  Neck: Normal range of motion. Neck supple. No JVD present. Carotid bruit is not present.  Cardiovascular: Normal rate, regular rhythm and normal heart sounds.   Pulmonary/Chest: Effort normal and breath sounds normal.  Abdominal: Normal appearance. There is no splenomegaly or hepatomegaly.  Neurological: She is alert and oriented to person, place, and time.  Skin: Skin is warm, dry and intact. No rash noted. No pallor.  Psychiatric: She has a normal mood and affect. Her behavior is normal. Judgment and thought content normal.   STEROID INJECTION Bilateral knees Procedure: Knee Intraarticular Steroid Injection   Description: After verbal consent and patient ed on Area prepped and draped using  semi-sterile technique. Using a anterior  approach, a mixture of 4 cc of  1% Marcaine & 1 cc of Kenalog 40 was injected into each knee joint.  A bandage was then placed over the injection site. Complications:  none Post Procedure Instructions: To the ER if any symptoms of erythema or swelling.        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        OA (osteoarthritis) of knee    Knee injections prn      Uncontrolled type 2 diabetes mellitus with chronic kidney disease ([place])    Refer to endocrine with a Hgb A1C of 9.4 and high doses of insulin already.  She has an endocrinologist who she is seeing this month.  For now increase AM insulin to 130          Follow up plan: Return in about 3 months (around 09/22/2016).

## 2016-06-25 NOTE — Assessment & Plan Note (Addendum)
Refer to endocrine with a Hgb A1C of 9.4 and high doses of insulin already.  She has an endocrinologist who she is seeing this month.  For now increase AM insulin to 130

## 2016-06-25 NOTE — Assessment & Plan Note (Addendum)
Knee injections prn.  Friend says she uses prescription strength Omega 3 which helps

## 2016-06-26 LAB — COMPREHENSIVE METABOLIC PANEL
ALBUMIN: 4 g/dL (ref 3.6–4.8)
ALT: 27 IU/L (ref 0–32)
AST: 21 IU/L (ref 0–40)
Albumin/Globulin Ratio: 1.5 (ref 1.2–2.2)
Alkaline Phosphatase: 103 IU/L (ref 39–117)
BILIRUBIN TOTAL: 0.3 mg/dL (ref 0.0–1.2)
BUN / CREAT RATIO: 14 (ref 12–28)
BUN: 15 mg/dL (ref 8–27)
CALCIUM: 9.3 mg/dL (ref 8.7–10.3)
CHLORIDE: 104 mmol/L (ref 96–106)
CO2: 25 mmol/L (ref 18–29)
CREATININE: 1.09 mg/dL — AB (ref 0.57–1.00)
GFR, EST AFRICAN AMERICAN: 60 mL/min/{1.73_m2} (ref >59)
GFR, EST NON AFRICAN AMERICAN: 52 mL/min/{1.73_m2} — AB (ref >59)
GLUCOSE: 214 mg/dL — AB (ref 65–99)
Globulin, Total: 2.7 g/dL (ref 1.5–4.5)
Potassium: 4.1 mmol/L (ref 3.5–5.2)
Sodium: 144 mmol/L (ref 134–144)
TOTAL PROTEIN: 6.7 g/dL (ref 6.0–8.5)

## 2016-06-28 LAB — HM DIABETES FOOT EXAM: HM DIABETIC FOOT EXAM: NORMAL

## 2016-07-09 DIAGNOSIS — R002 Palpitations: Secondary | ICD-10-CM | POA: Diagnosis not present

## 2016-07-09 DIAGNOSIS — E1165 Type 2 diabetes mellitus with hyperglycemia: Secondary | ICD-10-CM | POA: Diagnosis not present

## 2016-07-09 DIAGNOSIS — E559 Vitamin D deficiency, unspecified: Secondary | ICD-10-CM | POA: Diagnosis not present

## 2016-07-09 DIAGNOSIS — I158 Other secondary hypertension: Secondary | ICD-10-CM | POA: Diagnosis not present

## 2016-07-09 DIAGNOSIS — E784 Other hyperlipidemia: Secondary | ICD-10-CM | POA: Diagnosis not present

## 2016-07-09 DIAGNOSIS — I10 Essential (primary) hypertension: Secondary | ICD-10-CM | POA: Diagnosis not present

## 2016-07-09 DIAGNOSIS — E049 Nontoxic goiter, unspecified: Secondary | ICD-10-CM | POA: Diagnosis not present

## 2016-07-14 ENCOUNTER — Other Ambulatory Visit: Payer: Self-pay | Admitting: Unknown Physician Specialty

## 2016-07-15 ENCOUNTER — Other Ambulatory Visit: Payer: Self-pay | Admitting: Family Medicine

## 2016-07-15 ENCOUNTER — Other Ambulatory Visit: Payer: Self-pay | Admitting: Unknown Physician Specialty

## 2016-07-15 DIAGNOSIS — Z1231 Encounter for screening mammogram for malignant neoplasm of breast: Secondary | ICD-10-CM

## 2016-07-20 ENCOUNTER — Other Ambulatory Visit: Payer: Self-pay | Admitting: Unknown Physician Specialty

## 2016-07-20 ENCOUNTER — Ambulatory Visit
Admission: RE | Admit: 2016-07-20 | Discharge: 2016-07-20 | Disposition: A | Discharge status: Home / Self Care | Payer: Medicare Other | Source: Ambulatory Visit | Attending: Unknown Physician Specialty | Admitting: Unknown Physician Specialty

## 2016-07-20 DIAGNOSIS — Z1231 Encounter for screening mammogram for malignant neoplasm of breast: Secondary | ICD-10-CM | POA: Diagnosis not present

## 2016-07-23 ENCOUNTER — Ambulatory Visit
Admission: EM | Admit: 2016-07-23 | Discharge: 2016-07-23 | Disposition: A | Discharge status: Home / Self Care | Payer: Medicare Other | Attending: Family Medicine | Admitting: Family Medicine

## 2016-07-23 ENCOUNTER — Encounter: Payer: Self-pay | Admitting: *Deleted

## 2016-07-23 DIAGNOSIS — J0100 Acute maxillary sinusitis, unspecified: Secondary | ICD-10-CM

## 2016-07-23 MED ORDER — DOXYCYCLINE HYCLATE 100 MG PO CAPS
100.0000 mg | ORAL_CAPSULE | Freq: Two times a day (BID) | ORAL | 0 refills | Status: DC
Start: 2016-07-23 — End: 2016-09-22

## 2016-07-23 NOTE — ED Provider Notes (Signed)
CSN: [id number]     Arrival date & time 07/23/16  0815 History   First MD Initiated Contact with Patient 07/23/16 1005     Chief Complaint  Patient presents with  . Headache   (Consider location/radiation/quality/duration/timing/severity/associated sxs/prior Treatment) HPI  This a 66 year old female who presents with sinus pressure and headache where she indicates the frontal and maxillary sinus areas are that has become worse yesterday. She does have a history of allergic sinusitis in the past. She has had no fever but has noticed some chills. She's also complained of some wheezing in her chest at nighttime. She has had no discharge from her nose or lungs.       Past Medical History:  Diagnosis Date  . Asthma   . Chest pain   . DM2 (diabetes mellitus, type 2) ([place]) 1987  . Dyspnea    Cath normal coronaries. normal EF. RA 11. PA 37/17 (26) LVEDP 36  . GERD (gastroesophageal reflux disease)   . HTN (hypertension)   . Hyperlipidemia   . Obesity   . Osteoarthritis    knees  . Renal disease   . Thyroid disease    having thyroid removed 04/14/15  . Vertigo    none recently   Past Surgical History:  Procedure Laterality Date  . CARDIAC CATHETERIZATION    . CATARACT EXTRACTION W/PHACO Right 03/31/2015   Procedure: CATARACT EXTRACTION PHACO AND INTRAOCULAR LENS PLACEMENT (IOC);  Surgeon: Ronnell Freshwater, MD;  Location: [facility];  Service: Ophthalmology;  Laterality: Right;  DIABETIC - insulin  . EYE SURGERY    . HERNIA REPAIR     umbilical  . THYROIDECTOMY N/A 04/14/2015   Procedure: THYROIDECTOMY;  Surgeon: Margaretha Sheffield, MD;  Location: [facility];  Service: ENT;  Laterality: N/A;   Family History  Problem Relation Age of Onset  . Diabetes Mother   . Diabetes Father   . Diabetes Sister   . Diabetes Brother   . Diabetes Sister   . Diabetes Sister   . Diabetes Maternal Grandmother   . Diabetes Maternal Grandfather   . Diabetes Paternal Grandmother   .  Diabetes Paternal Grandfather   . Heart disease Neg Hx   . Coronary artery disease Neg Hx   . Breast cancer Neg Hx    Social History  Substance Use Topics  . Smoking status: Never Smoker  . Smokeless tobacco: Never Used  . Alcohol use No   OB History    No data available     Review of Systems  Constitutional: Positive for activity change and chills. Negative for fatigue and fever.  HENT: Positive for sinus pain and sinus pressure.   Eyes: Positive for discharge and itching.  All other systems reviewed and are negative.   Allergies  Celebrex [celecoxib]; Sulfa antibiotics; Sulfisoxazole; and Sulfur  Home Medications   Prior to Admission medications   Medication Sig Start Date End Date Taking? Authorizing Provider  atorvastatin (LIPITOR) 40 MG tablet TAKE ONE TABLET BY MOUTH AT BEDTIME 06/16/16  Yes Kathrine Haddock, NP  budesonide-formoterol (SYMBICORT) 160-4.5 MCG/ACT inhaler Inhale 2 puffs into the lungs 2 (two) times daily. 06/25/16  Yes Kathrine Haddock, NP  calcium-vitamin D (OSCAL WITH D) 500-200 MG-UNIT tablet Take 1 tablet by mouth.   Yes Historical Provider, MD  Dulaglutide (TRULICITY) 1.5 0000000 SOPN Inject 1.5 mg into the skin once a week. 11/04/15  Yes Kathrine Haddock, NP  fluticasone (FLONASE) 50 MCG/ACT nasal spray Place 2 sprays into both nostrils daily. 06/25/16  Yes Kathrine Haddock, NP  HUMALOG KWIKPEN 100 UNIT/ML KiwkPen INJECT 20 UNITS BEFORE BREAKFAST, 20 UNITS BEFORE LUNCH AND 30 UNITS BEFORE EVENING MEAL 07/14/16  Yes Kathrine Haddock, NP  LANTUS SOLOSTAR 100 UNIT/ML Solostar Pen INJECT 40-60 UNITS AT BEDTIME 10/08/15  Yes Kathrine Haddock, NP  levothyroxine (SYNTHROID, LEVOTHROID) 100 MCG tablet Take 100 mcg by mouth daily. 07/02/15  Yes Historical Provider, MD  lisinopril (PRINIVIL,ZESTRIL) 20 MG tablet TAKE ONE TABLET BY MOUTH ONCE DAILY 06/16/16  Yes Kathrine Haddock, NP  loratadine (CLARITIN) 10 MG tablet Take 1 tablet (10 mg total) by mouth daily. For itching 03/20/16  Yes Frederich Cha, MD  montelukast (SINGULAIR) 10 MG tablet Take 1 tablet (10 mg total) by mouth daily. 02/03/16  Yes Megan P Johnson, DO  omega-3 acid ethyl esters (LOVAZA) 1 g capsule Take 2 capsules (2 g total) by mouth 2 (two) times daily. 06/25/16  Yes Kathrine Haddock, NP  ONE TOUCH ULTRA TEST test strip 1 each by Other route 2 (two) times daily.  03/15/15  Yes Historical Provider, MD  Jonetta Speak LANCETS [id number] MISC 2 (two) times daily.  03/15/15  Yes Historical Provider, MD  pregabalin (LYRICA) 100 MG capsule Take 1 capsule (100 mg total) by mouth daily. 06/25/16  Yes Kathrine Haddock, NP  amLODipine (NORVASC) 2.5 MG tablet TAKE ONE TABLET BY MOUTH ONCE DAILY 06/16/16   Kathrine Haddock, NP  Cholecalciferol (D 2000) 2000 UNITS TABS Take by mouth.    Historical Provider, MD  doxycycline (VIBRAMYCIN) 100 MG capsule Take 1 capsule (100 mg total) by mouth 2 (two) times daily. 07/23/16   Lorin Picket, PA-C  omeprazole (PRILOSEC) 20 MG capsule Take 1 capsule (20 mg total) by mouth daily. 09/11/15   Kathrine Haddock, NP  ranitidine (ZANTAC) 150 MG capsule Take 1 capsule (150 mg total) by mouth 2 (two) times daily. For itching 03/20/16   Frederich Cha, MD  traMADol (ULTRAM) 50 MG tablet Take 1 tablet (50 mg total) by mouth every 6 (six) hours as needed. Patient not taking: Reported on 06/25/2016 03/27/16   Norval Gable, MD   Meds Ordered and Administered this Visit  Medications - No data to display  BP (!) 155/70 (BP Location: Left Arm)   Pulse 72   Temp 97.6 F (36.4 C) (Oral)   Resp 16   Ht 5\' 4"  (1.626 m)   Wt 184 lb (83.5 kg)   LMP  (LMP Unknown)   SpO2 100%   BMI 31.58 kg/m  No data found.   Physical Exam  Constitutional: She is oriented to person, place, and time. She appears well-developed and well-nourished. No distress.  HENT:  Head: Normocephalic and atraumatic.  Right Ear: External ear normal.  Left Ear: External ear normal.  Mouth/Throat: Oropharynx is clear and moist.  Patient has tenderness over  the frontal and maxillary sinuses more pronounced over the maxillary. He also has some clear discharge from her eyes are watery.  Eyes: EOM are normal. Pupils are equal, round, and reactive to light. Right eye exhibits no discharge. Left eye exhibits no discharge.  Neck: Normal range of motion. Neck supple.  Pulmonary/Chest: Effort normal and breath sounds normal. No respiratory distress. She has no wheezes. She has no rales.  Musculoskeletal: Normal range of motion.  Lymphadenopathy:    She has no cervical adenopathy.  Neurological: She is alert and oriented to person, place, and time.  Skin: Skin is warm and dry. She is not diaphoretic.  Psychiatric: She has a  normal mood and affect. Her behavior is normal. Judgment and thought content normal.  Nursing note and vitals reviewed.   Urgent Care Course     Procedures (including critical care time)  Labs Review Labs Reviewed - No data to display  Imaging Review No results found.   Visual Acuity Review  Right Eye Distance:   Left Eye Distance:   Bilateral Distance:    Right Eye Near:   Left Eye Near:    Bilateral Near:         MDM   1. Acute non-recurrent maxillary sinusitis    Discharge Medication List as of 07/23/2016 10:18 AM    START taking these medications   Details  doxycycline (VIBRAMYCIN) 100 MG capsule Take 1 capsule (100 mg total) by mouth 2 (two) times daily., Starting Fri 07/23/2016, Normal      Plan: 1. Test/x-ray results and diagnosis reviewed with patient 2. rx as per orders; risks, benefits, potential side effects reviewed with patient 3. Recommend supportive treatment with Continued use of Flonase. Consider adding Zyrtec to the regimen. I have also recommended a Netty pot however she is reluctant to use this as she has in the past. I will provide her with a prescription for doxycycline since she is not able to become symptom-free on the Flonase. 4. F/u prn if symptoms worsen or don't improve      Lorin Picket, PA-C 07/23/16 1123

## 2016-07-23 NOTE — ED Triage Notes (Signed)
Patient started having a sinus pressure and headaches 1 week ago. Sinus pressure became worse yesterday. Patient has a history of allergy sinus.

## 2016-07-26 ENCOUNTER — Other Ambulatory Visit: Payer: Self-pay | Admitting: Unknown Physician Specialty

## 2016-07-26 MED ORDER — TRAMADOL HCL 50 MG PO TABS: 50.0000 mg | ORAL_TABLET | Freq: Four times a day (QID) | ORAL | 0 refills | Status: DC | PRN

## 2016-07-26 NOTE — Telephone Encounter (Signed)
Pt would like a refill for traMADol (ULTRAM) 50 MG tablet sent to [facility].

## 2016-07-26 NOTE — Telephone Encounter (Signed)
Routing to provider  

## 2016-09-22 ENCOUNTER — Ambulatory Visit (INDEPENDENT_AMBULATORY_CARE_PROVIDER_SITE_OTHER): Payer: Medicare Other | Admitting: Unknown Physician Specialty

## 2016-09-22 ENCOUNTER — Encounter: Payer: Self-pay | Admitting: Unknown Physician Specialty

## 2016-09-22 VITALS — BP 174/80 | HR 70 | Temp 97.5°F | Ht 64.5 in | Wt 195.4 lb

## 2016-09-22 DIAGNOSIS — E1122 Type 2 diabetes mellitus with diabetic chronic kidney disease: Secondary | ICD-10-CM

## 2016-09-22 DIAGNOSIS — N183 Chronic kidney disease, stage 3 (moderate): Secondary | ICD-10-CM | POA: Diagnosis not present

## 2016-09-22 DIAGNOSIS — R42 Dizziness and giddiness: Secondary | ICD-10-CM | POA: Diagnosis not present

## 2016-09-22 DIAGNOSIS — I129 Hypertensive chronic kidney disease with stage 1 through stage 4 chronic kidney disease, or unspecified chronic kidney disease: Secondary | ICD-10-CM

## 2016-09-22 DIAGNOSIS — Z794 Long term (current) use of insulin: Secondary | ICD-10-CM | POA: Diagnosis not present

## 2016-09-22 DIAGNOSIS — J301 Allergic rhinitis due to pollen: Secondary | ICD-10-CM

## 2016-09-22 DIAGNOSIS — E1165 Type 2 diabetes mellitus with hyperglycemia: Secondary | ICD-10-CM | POA: Diagnosis not present

## 2016-09-22 DIAGNOSIS — IMO0002 Reserved for concepts with insufficient information to code with codable children: Secondary | ICD-10-CM

## 2016-09-22 DIAGNOSIS — J309 Allergic rhinitis, unspecified: Secondary | ICD-10-CM | POA: Insufficient documentation

## 2016-09-22 NOTE — Assessment & Plan Note (Signed)
Seeing endocrine now.  Sounds like hypoglycemic in the AM.  She is planning on going back this next week

## 2016-09-22 NOTE — Progress Notes (Signed)
BP (!) 174/80   Pulse 70   Temp 97.5 F (36.4 C)   Ht 5' 4.5" (1.638 m) Comment: pt had shoes on  Wt 195 lb 6.4 oz (88.6 kg) Comment: pt had shoes on  LMP  (LMP Unknown)   SpO2 99%   BMI 33.02 kg/m    Subjective:    Patient ID: Mary Parks, female    DOB: 01/02/1951, 66 y.o.   MRN: [id number]  HPI: Mary Parks is a 66 y.o. female  Chief Complaint  Patient presents with  . Diabetes  . Hyperlipidemia  . Hypertension  . URI    pt states she has had pressure behind eyes and ears, states she also has a pressure across her forehead. States this goes on all throughout the year    Nasal congestion Pt states she is having a lot of problems with congestion since the pollen started.  She is taking Clariton, Flonase and Singulair.  She is dizzy with nasal congestion and ear fullness.  Complaining of drainage.    Diabetes: Started seeing an Musician and going again this month.  Taking 20 units Lantus in the AM.  Taking Humalog 30-20-30 and 45 units at night.   Blood Sugars averaging: Too low in th morning, sometimes as low as 46 eye exam within last year Last Hgb A1C: 9.4 2 months ago  Hypertension  Endocrinologist took her off the Amlodipine and started Metoprolol due to heart palpitations but this hadn't made a difference.   Average home BPs 112-120 at home   Using medication without problems or lightheadedness No chest pain with exertion or shortness of breath No Edema  Elevated Cholesterol Using medications without problems No Muscle aches  Diet/Exercise: Started out walking well until it turned cold and allergies got bad.    I can't find notes from her Endocrinologist.    Relevant past medical, surgical, family and social history reviewed and updated as indicated. Interim medical history since our last visit reviewed. Allergies and medications reviewed and updated.  Review of Systems  Per HPI unless specifically indicated above     Objective:    BP  (!) 174/80   Pulse 70   Temp 97.5 F (36.4 C)   Ht 5' 4.5" (1.638 m) Comment: pt had shoes on  Wt 195 lb 6.4 oz (88.6 kg) Comment: pt had shoes on  LMP  (LMP Unknown)   SpO2 99%   BMI 33.02 kg/m   Wt Readings from Last 3 Encounters:  09/22/16 195 lb 6.4 oz (88.6 kg)  07/23/16 184 lb (83.5 kg)  06/25/16 184 lb 12.8 oz (83.8 kg)    Physical Exam  Constitutional: She is oriented to person, place, and time. She appears well-developed and well-nourished. No distress.  HENT:  Head: Normocephalic and atraumatic.  Right Ear: Tympanic membrane, external ear and ear canal normal.  Left Ear: Tympanic membrane, external ear and ear canal normal.  Nose: Mucosal edema and rhinorrhea present.  Eyes: Conjunctivae and lids are normal. Right eye exhibits no discharge. Left eye exhibits no discharge. No scleral icterus.  Neck: Normal range of motion. Neck supple. No JVD present. Carotid bruit is not present.  Cardiovascular: Normal rate, regular rhythm and normal heart sounds.   Pulmonary/Chest: Effort normal and breath sounds normal.  Abdominal: Normal appearance. There is no splenomegaly or hepatomegaly.  Musculoskeletal: Normal range of motion.  Neurological: She is alert and oriented to person, place, and time.  Skin: Skin is warm, dry and  intact. No rash noted. No pallor.  Psychiatric: She has a normal mood and affect. Her behavior is normal. Judgment and thought content normal.    Results for orders placed or performed in visit on 09/07/16  HM DIABETES FOOT EXAM  Result Value Ref Range   HM Diabetic Foot Exam bilateral- normal       Assessment & Plan:   Problem List Items Addressed This Visit      Unprioritized   Allergic rhinitis    Worsening problem.  On maximum therapy.  Swtch from clariton to Zyrtec.  Use Neti pot.        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  It seems  her Endocrinologist has readjusted medication. I would like to increase Metoprolol but pt reluctant on this as her Endocrinologist has made the changes.  Seeing Nephrologist in June.  I asked pt to let me know if BP is elevated today at home.        Relevant Medications   metoprolol tartrate (LOPRESSOR) 25 MG tablet   Uncontrolled type 2 diabetes mellitus with chronic kidney disease ([place])    Seeing endocrine now.  Sounds like hypoglycemic in the AM.  She is planning on going back this next week       Other Visit Diagnoses    Vertigo    -  Primary   Due to allergies.  Rx for Meclizine      Will request notes for review from Dr Rosario Jacks.  Seeing him in 2 days.  Seeing Nephrologist in June.  Therefore, I will see her in 3 months  Follow up plan: Return in about 3 months (around 12/23/2016).

## 2016-09-22 NOTE — Patient Instructions (Addendum)
Change to Zyrtec Use Netti pot  Please ask notes from Endocrine sent to me.    Get Meclizine for the vertigo

## 2016-09-22 NOTE — Assessment & Plan Note (Addendum)
Worsening problem.  On maximum therapy.  Swtch from clariton to Zyrtec.  Use Neti pot.

## 2016-09-22 NOTE — Assessment & Plan Note (Addendum)
Very poor control here but states SBP was 120 at home.  It seems her Endocrinologist has readjusted medication. I would like to increase Metoprolol but pt reluctant on this as her Endocrinologist has made the changes.  Seeing Nephrologist in June.  I asked pt to let me know if BP is elevated today at home.

## 2016-10-08 ENCOUNTER — Other Ambulatory Visit: Payer: Self-pay | Admitting: Family Medicine

## 2016-10-08 DIAGNOSIS — E1165 Type 2 diabetes mellitus with hyperglycemia: Secondary | ICD-10-CM | POA: Diagnosis not present

## 2016-10-08 DIAGNOSIS — N289 Disorder of kidney and ureter, unspecified: Secondary | ICD-10-CM | POA: Diagnosis not present

## 2016-10-08 DIAGNOSIS — E049 Nontoxic goiter, unspecified: Secondary | ICD-10-CM | POA: Diagnosis not present

## 2016-10-08 DIAGNOSIS — R002 Palpitations: Secondary | ICD-10-CM | POA: Diagnosis not present

## 2016-10-08 DIAGNOSIS — Z0000 Encounter for general adult medical examination without abnormal findings: Secondary | ICD-10-CM | POA: Diagnosis not present

## 2016-10-19 ENCOUNTER — Other Ambulatory Visit: Payer: Self-pay | Admitting: Unknown Physician Specialty

## 2016-10-29 ENCOUNTER — Telehealth: Payer: Self-pay

## 2016-10-29 ENCOUNTER — Other Ambulatory Visit: Payer: Self-pay

## 2016-10-29 DIAGNOSIS — Z1211 Encounter for screening for malignant neoplasm of colon: Secondary | ICD-10-CM

## 2016-10-29 NOTE — Telephone Encounter (Signed)
Gastroenterology Pre-Procedure Review  Request Date: 11/12/16 Requesting Physician: Dr. Allen Norris  PATIENT REVIEW QUESTIONS: The patient responded to the following health history questions as indicated:    1. Are you having any GI issues? no 2. Do you have a personal history of Polyps? yes (7 ye) 3. Do you have a family history of Colon Cancer or Polyps? no 4. Diabetes Mellitus? yes 5. Joint replacements in the past 12 months?no 6. Major health problems in the past 3 months?no 7. Any artificial heart valves, MVP, or defibrillator?no    MEDICATIONS & ALLERGIES:    Patient reports the following regarding taking any anticoagulation/antiplatelet therapy:   Plavix, Coumadin, Eliquis, Xarelto, Lovenox, Pradaxa, Brilinta, or Effient? no Aspirin? yes (81 mg )  Patient confirms/reports the following medications:  Current Outpatient Prescriptions  Medication Sig Dispense Refill  . atorvastatin (LIPITOR) 40 MG tablet TAKE ONE TABLET BY MOUTH AT BEDTIME 90 tablet 0  . budesonide-formoterol (SYMBICORT) 160-4.5 MCG/ACT inhaler Inhale 2 puffs into the lungs 2 (two) times daily. 3 Inhaler 4  . calcium-vitamin D (OSCAL WITH D) 500-200 MG-UNIT tablet Take 1 tablet by mouth.    . Cholecalciferol (D 2000) 2000 UNITS TABS Take by mouth.    . Dulaglutide (TRULICITY) 1.5 IO/9.6EX SOPN Inject 1.5 mg into the skin once a week. 4 pen 12  . fluticasone (FLONASE) 50 MCG/ACT nasal spray Place 2 sprays into both nostrils daily. 16 g 6  . HUMALOG KWIKPEN 100 UNIT/ML KiwkPen INJECT 20 UNITS BEFORE BREAKFAST, 20 UNITS BEFORE LUNCH AND 30 UNITS BEFORE EVENING MEAL 60 mL 12  . LANTUS SOLOSTAR 100 UNIT/ML Solostar Pen INJECT 40-60 UNITS SUBCUTANEOUSLY AT BEDTIME 15 mL 12  . levothyroxine (SYNTHROID, LEVOTHROID) 100 MCG tablet Take 100 mcg by mouth daily.    Marland Kitchen lisinopril (PRINIVIL,ZESTRIL) 20 MG tablet TAKE ONE TABLET BY MOUTH ONCE DAILY 90 tablet 1  . loratadine (CLARITIN) 10 MG tablet Take 1 tablet (10 mg total) by mouth  daily. For itching 15 tablet 0  . metoprolol tartrate (LOPRESSOR) 25 MG tablet Take 25 mg by mouth 2 (two) times daily.    . montelukast (SINGULAIR) 10 MG tablet TAKE ONE TABLET BY MOUTH ONCE DAILY 90 tablet 0  . omega-3 acid ethyl esters (LOVAZA) 1 g capsule Take 2 capsules (2 g total) by mouth 2 (two) times daily. 60 capsule 12  . omeprazole (PRILOSEC) 20 MG capsule Take 1 capsule (20 mg total) by mouth daily. 30 capsule 12  . ONE TOUCH ULTRA TEST test strip 1 each by Other route 2 (two) times daily.     [name] DELICA LANCETS 52W MISC 2 (two) times daily.     . pregabalin (LYRICA) 100 MG capsule Take 1 capsule (100 mg total) by mouth daily. 30 capsule 12  . ranitidine (ZANTAC) 150 MG capsule Take 1 capsule (150 mg total) by mouth 2 (two) times daily. For itching 30 capsule 0  . traMADol (ULTRAM) 50 MG tablet Take 1 tablet (50 mg total) by mouth every 6 (six) hours as needed. 15 tablet 0   No current facility-administered medications for this visit.     Patient confirms/reports the following allergies:  Allergies  Allergen Reactions  . Celebrex [Celecoxib]     hallucinations  . Sulfa Antibiotics   . Sulfisoxazole Rash  . Sulfur Itching    No orders of the defined types were placed in this encounter.   AUTHORIZATION INFORMATION Primary Insurance: 1D#: Group #:  Secondary Insurance: 1D#: Group #:  SCHEDULE INFORMATION:  Date:11/12/16  Time: Location:[facility]

## 2016-11-03 ENCOUNTER — Encounter: Payer: Self-pay | Admitting: *Deleted

## 2016-11-05 ENCOUNTER — Telehealth: Payer: Self-pay | Admitting: Family Medicine

## 2016-11-05 MED ORDER — INSULIN DEGLUDEC 100 UNIT/ML ~~LOC~~ SOPN: PEN_INJECTOR | SUBCUTANEOUS | 12 refills | Status: DC

## 2016-11-05 NOTE — Telephone Encounter (Signed)
Insurance doesn't cover lantus, requests levemir or tresiba. Rx for tresiba sent to her pharmacy

## 2016-11-09 DIAGNOSIS — N182 Chronic kidney disease, stage 2 (mild): Secondary | ICD-10-CM | POA: Diagnosis not present

## 2016-11-11 NOTE — Discharge Instructions (Signed)
General Anesthesia, Adult, Care After °These instructions provide you with information about caring for yourself after your procedure. Your health care provider may also give you more specific instructions. Your treatment has been planned according to current medical practices, but problems sometimes occur. Call your health care provider if you have any problems or questions after your procedure. °What can I expect after the procedure? °After the procedure, it is common to have: °· Vomiting. °· A sore throat. °· Mental slowness. ° °It is common to feel: °· Nauseous. °· Cold or shivery. °· Sleepy. °· Tired. °· Sore or achy, even in parts of your body where you did not have surgery. ° °Follow these instructions at home: °For at least 24 hours after the procedure: °· Do not: °? Participate in activities where you could fall or become injured. °? Drive. °? Use heavy machinery. °? Drink alcohol. °? Take sleeping pills or medicines that cause drowsiness. °? Make important decisions or sign legal documents. °? Take care of children on your own. °· Rest. °Eating and drinking °· If you vomit, drink water, juice, or soup when you can drink without vomiting. °· Drink enough fluid to keep your urine clear or pale yellow. °· Make sure you have little or no nausea before eating solid foods. °· Follow the diet recommended by your health care provider. °General instructions °· Have a responsible adult stay with you until you are awake and alert. °· Return to your normal activities as told by your health care provider. Ask your health care provider what activities are safe for you. °· Take over-the-counter and prescription medicines only as told by your health care provider. °· If you smoke, do not smoke without supervision. °· Keep all follow-up visits as told by your health care provider. This is important. °Contact a health care provider if: °· You continue to have nausea or vomiting at home, and medicines are not helpful. °· You  cannot drink fluids or start eating again. °· You cannot urinate after 8-12 hours. °· You develop a skin rash. °· You have fever. °· You have increasing redness at the site of your procedure. °Get help right away if: °· You have difficulty breathing. °· You have chest pain. °· You have unexpected bleeding. °· You feel that you are having a life-threatening or urgent problem. °This information is not intended to replace advice given to you by your health care provider. Make sure you discuss any questions you have with your health care provider. °Document Released: 08/16/2000 Document Revised: 10/13/2015 Document Reviewed: 04/24/2015 °Elsevier Interactive Patient Education © 2018 [employer] ° °

## 2016-11-12 ENCOUNTER — Ambulatory Visit
Admission: RE | Admit: 2016-11-12 | Discharge: 2016-11-12 | Disposition: A | Discharge status: Home / Self Care | Payer: Medicare Other | Source: Ambulatory Visit | Attending: Gastroenterology | Admitting: Gastroenterology

## 2016-11-12 ENCOUNTER — Ambulatory Visit: Payer: Medicare Other | Admitting: Anesthesiology

## 2016-11-12 ENCOUNTER — Encounter
Admission: RE | Disposition: A | Discharge status: Home / Self Care | Payer: Self-pay | Source: Ambulatory Visit | Attending: Gastroenterology

## 2016-11-12 DIAGNOSIS — D120 Benign neoplasm of cecum: Secondary | ICD-10-CM | POA: Diagnosis not present

## 2016-11-12 DIAGNOSIS — Z882 Allergy status to sulfonamides status: Secondary | ICD-10-CM | POA: Diagnosis not present

## 2016-11-12 DIAGNOSIS — Z886 Allergy status to analgesic agent status: Secondary | ICD-10-CM | POA: Insufficient documentation

## 2016-11-12 DIAGNOSIS — Z794 Long term (current) use of insulin: Secondary | ICD-10-CM | POA: Diagnosis not present

## 2016-11-12 DIAGNOSIS — I10 Essential (primary) hypertension: Secondary | ICD-10-CM | POA: Insufficient documentation

## 2016-11-12 DIAGNOSIS — Z6833 Body mass index (BMI) 33.0-33.9, adult: Secondary | ICD-10-CM | POA: Diagnosis not present

## 2016-11-12 DIAGNOSIS — Z7951 Long term (current) use of inhaled steroids: Secondary | ICD-10-CM | POA: Insufficient documentation

## 2016-11-12 DIAGNOSIS — Z79899 Other long term (current) drug therapy: Secondary | ICD-10-CM | POA: Insufficient documentation

## 2016-11-12 DIAGNOSIS — E119 Type 2 diabetes mellitus without complications: Secondary | ICD-10-CM | POA: Diagnosis not present

## 2016-11-12 DIAGNOSIS — D124 Benign neoplasm of descending colon: Secondary | ICD-10-CM | POA: Diagnosis not present

## 2016-11-12 DIAGNOSIS — Z1211 Encounter for screening for malignant neoplasm of colon: Secondary | ICD-10-CM

## 2016-11-12 DIAGNOSIS — K219 Gastro-esophageal reflux disease without esophagitis: Secondary | ICD-10-CM | POA: Insufficient documentation

## 2016-11-12 DIAGNOSIS — D122 Benign neoplasm of ascending colon: Secondary | ICD-10-CM

## 2016-11-12 DIAGNOSIS — E079 Disorder of thyroid, unspecified: Secondary | ICD-10-CM | POA: Insufficient documentation

## 2016-11-12 DIAGNOSIS — K635 Polyp of colon: Secondary | ICD-10-CM | POA: Diagnosis not present

## 2016-11-12 DIAGNOSIS — J449 Chronic obstructive pulmonary disease, unspecified: Secondary | ICD-10-CM | POA: Diagnosis not present

## 2016-11-12 DIAGNOSIS — E669 Obesity, unspecified: Secondary | ICD-10-CM | POA: Insufficient documentation

## 2016-11-12 DIAGNOSIS — E785 Hyperlipidemia, unspecified: Secondary | ICD-10-CM | POA: Diagnosis not present

## 2016-11-12 HISTORY — PX: POLYPECTOMY: SHX149

## 2016-11-12 HISTORY — PX: COLONOSCOPY WITH PROPOFOL: SHX5780

## 2016-11-12 LAB — GLUCOSE, CAPILLARY
Glucose-Capillary: 136 mg/dL — ABNORMAL HIGH (ref 65–99)
Glucose-Capillary: 131 mg/dL — ABNORMAL HIGH (ref 65–99)

## 2016-11-12 SURGERY — COLONOSCOPY WITH PROPOFOL
Anesthesia: General | Wound class: Contaminated

## 2016-11-12 MED ORDER — PROPOFOL 10 MG/ML IV BOLUS
INTRAVENOUS | Status: DC | PRN
  Administered 2016-11-12 (×3): 20 mg via INTRAVENOUS
  Administered 2016-11-12: 60 mg via INTRAVENOUS

## 2016-11-12 MED ORDER — LACTATED RINGERS IV SOLN
INTRAVENOUS | Status: DC
  Administered 2016-11-12: 07:00:00 via INTRAVENOUS

## 2016-11-12 MED ORDER — LIDOCAINE HCL (CARDIAC) 20 MG/ML IV SOLN
INTRAVENOUS | Status: DC | PRN
  Administered 2016-11-12: 40 mg via INTRAVENOUS

## 2016-11-12 MED ORDER — LACTATED RINGERS IV SOLN: 500.0000 mL | INTRAVENOUS | Status: DC

## 2016-11-12 SURGICAL SUPPLY — 23 items
CANISTER SUCT 1200ML W/VALVE (MISCELLANEOUS) ×4 IMPLANT
CLIP HMST 235XBRD CATH ROT (MISCELLANEOUS) IMPLANT
CLIP RESOLUTION 360 11X235 (MISCELLANEOUS)
FCP ESCP3.2XJMB 240X2.8X (MISCELLANEOUS) ×2
FORCEPS BIOP RAD 4 LRG CAP 4 (CUTTING FORCEPS) IMPLANT
FORCEPS BIOP RJ4 240 W/NDL (MISCELLANEOUS) ×2
FORCEPS ESCP3.2XJMB 240X2.8X (MISCELLANEOUS) ×2 IMPLANT
GOWN CVR UNV OPN BCK APRN NK (MISCELLANEOUS) ×4 IMPLANT
GOWN ISOL THUMB LOOP REG UNIV (MISCELLANEOUS) ×4
INJECTOR VARIJECT VIN23 (MISCELLANEOUS) IMPLANT
KIT DEFENDO VALVE AND CONN (KITS) IMPLANT
KIT ENDO PROCEDURE OLY (KITS) ×4 IMPLANT
MARKER SPOT ENDO TATTOO 5ML (MISCELLANEOUS) IMPLANT
PAD GROUND ADULT SPLIT (MISCELLANEOUS) IMPLANT
PROBE APC STR FIRE (PROBE) IMPLANT
RETRIEVER NET ROTH 2.5X230 LF (MISCELLANEOUS) IMPLANT
SNARE SHORT THROW 13M SML OVAL (MISCELLANEOUS) ×4 IMPLANT
SNARE SHORT THROW 30M LRG OVAL (MISCELLANEOUS) IMPLANT
SNARE SNG USE RND 15MM (INSTRUMENTS) IMPLANT
SPOT EX ENDOSCOPIC TATTOO (MISCELLANEOUS)
TRAP ETRAP POLY (MISCELLANEOUS) ×4 IMPLANT
VARIJECT INJECTOR VIN23 (MISCELLANEOUS)
WATER STERILE IRR 250ML POUR (IV SOLUTION) ×4 IMPLANT

## 2016-11-12 NOTE — H&P (Signed)
Mary Lame, MD [facility] [address], [place], [place] [address] Phone: [phone] Fax : [phone]  Primary Care Physician:  Kathrine Haddock, NP Primary Gastroenterologist:  Dr. Allen Norris  Pre-Procedure History & Physical: HPI:  Mary Parks is a 66 y.o. female is here for a screening colonoscopy.   Past Medical History:  Diagnosis Date  . Asthma   . Chest pain   . DM2 (diabetes mellitus, type 2) ([place]) 1987  . Dyspnea    Cath normal coronaries. normal EF. RA 11. PA 37/17 (26) LVEDP 36  . GERD (gastroesophageal reflux disease)   . HTN (hypertension)   . Hyperlipidemia   . Obesity   . Osteoarthritis    knees  . Renal disease   . Thyroid disease    having thyroid removed 04/14/15  . Vertigo    none recently    Past Surgical History:  Procedure Laterality Date  . CARDIAC CATHETERIZATION    . CATARACT EXTRACTION W/PHACO Right 03/31/2015   Procedure: CATARACT EXTRACTION PHACO AND INTRAOCULAR LENS PLACEMENT (IOC);  Surgeon: Ronnell Freshwater, MD;  Location: [place];  Service: Ophthalmology;  Laterality: Right;  DIABETIC - insulin  . EYE SURGERY    . HERNIA REPAIR     umbilical  . THYROIDECTOMY N/A 04/14/2015   Procedure: THYROIDECTOMY;  Surgeon: Margaretha Sheffield, MD;  Location: [facility];  Service: ENT;  Laterality: N/A;    Prior to Admission medications   Medication Sig Start Date End Date Taking? Authorizing Provider  atorvastatin (LIPITOR) 40 MG tablet TAKE ONE TABLET BY MOUTH AT BEDTIME 06/16/16  Yes Kathrine Haddock, NP  budesonide-formoterol (SYMBICORT) 160-4.5 MCG/ACT inhaler Inhale 2 puffs into the lungs 2 (two) times daily. 06/25/16  Yes Kathrine Haddock, NP  calcium-vitamin D (OSCAL WITH D) 500-200 MG-UNIT tablet Take 1 tablet by mouth.   Yes [provider]  cetirizine (ZYRTEC) 10 MG tablet Take 10 mg by mouth daily.   Yes [provider]  Dulaglutide (TRULICITY) 1.5 PH/1.5AV SOPN Inject 1.5 mg into the skin once a week. 11/04/15   Yes Kathrine Haddock, NP  fluticasone (FLONASE) 50 MCG/ACT nasal spray Place 2 sprays into both nostrils daily. 06/25/16  Yes Kathrine Haddock, NP  HUMALOG KWIKPEN 100 UNIT/ML KiwkPen INJECT 20 UNITS BEFORE BREAKFAST, 20 UNITS BEFORE LUNCH AND 30 UNITS BEFORE EVENING MEAL 07/14/16  Yes Kathrine Haddock, NP  insulin glargine (LANTUS) 100 UNIT/ML injection Inject 35 Units into the skin at bedtime.   Yes [provider]  levothyroxine (SYNTHROID, LEVOTHROID) 100 MCG tablet Take 100 mcg by mouth daily. 07/02/15  Yes [provider]  lisinopril (PRINIVIL,ZESTRIL) 20 MG tablet TAKE ONE TABLET BY MOUTH ONCE DAILY 06/16/16  Yes Kathrine Haddock, NP  metoprolol tartrate (LOPRESSOR) 25 MG tablet Take 25 mg by mouth 2 (two) times daily. 07/09/16  Yes [provider]  montelukast (SINGULAIR) 10 MG tablet TAKE ONE TABLET BY MOUTH ONCE DAILY 10/08/16  Yes Johnson, Megan P, DO  omeprazole (PRILOSEC) 20 MG capsule Take 1 capsule (20 mg total) by mouth daily. 09/11/15  Yes Kathrine Haddock, NP  ONE TOUCH ULTRA TEST test strip 1 each by Other route 2 (two) times daily.  03/15/15  Yes [provider]  Jonetta Speak LANCETS 69V MISC 2 (two) times daily.  03/15/15  Yes [provider]  pregabalin (LYRICA) 100 MG capsule Take 1 capsule (100 mg total) by mouth daily. 06/25/16  Yes Kathrine Haddock, NP  insulin degludec (TRESIBA FLEXTOUCH) 100 UNIT/ML SOPN FlexTouch Pen  Inject 40-60 units SubQ at bed time Patient not taking: Reported on 11/12/2016 11/05/16   Park Liter P, DO  traMADol (ULTRAM) 50 MG tablet Take 1 tablet (50 mg total) by mouth every 6 (six) hours as needed. Patient not taking: Reported on 11/03/2016 07/26/16   Kathrine Haddock, NP    Allergies as of 10/29/2016 - Review Complete 09/22/2016  Allergen Reaction Noted  . Celebrex [celecoxib]  11/04/2014  . Sulfa antibiotics  11/23/2014  . Sulfisoxazole Rash 11/04/2014  . Sulfur Itching 11/05/2014    Family History  Problem  Relation Age of Onset  . Diabetes Mother   . Diabetes Father   . Diabetes Sister   . Diabetes Brother   . Diabetes Sister   . Diabetes Sister   . Diabetes Maternal Grandmother   . Diabetes Maternal Grandfather   . Diabetes Paternal Grandmother   . Diabetes Paternal Grandfather   . Heart disease Neg Hx   . Coronary artery disease Neg Hx   . Breast cancer Neg Hx     Social History   Social History  . Marital status: Widowed    Spouse name: N/A  . Number of children: N/A  . Years of education: N/A   Occupational History  . Works at [employer], Corporate treasurer at [place]  . Smoking status: Never Smoker  . Smokeless tobacco: Never Used  . Alcohol use No  . Drug use: No  . Sexual activity: No   Other Topics Concern  . Not on file   Social History Narrative  . No narrative on file    Review of Systems: See HPI, otherwise negative ROS  Physical Exam: BP 138/82   Pulse 78   Temp 98.1 F (36.7 C) (Temporal)   Resp 16   Ht 5' 4.5" (1.638 m)   Wt 190 lb (86.2 kg)   LMP  (LMP Unknown)   SpO2 100%   BMI 32.11 kg/m  General:   Alert,  pleasant and cooperative in NAD Head:  Normocephalic and atraumatic. Neck:  Supple; no masses or thyromegaly. Lungs:  Clear throughout to auscultation.    Heart:  Regular rate and rhythm. Abdomen:  Soft, nontender and nondistended. Normal bowel sounds, without guarding, and without rebound.   Neurologic:  Alert and  oriented x4;  grossly normal neurologically.  Impression/Plan: Mary Parks is now here to undergo a screening colonoscopy.  Risks, benefits, and alternatives regarding colonoscopy have been reviewed with the patient.  Questions have been answered.  All parties agreeable.

## 2016-11-12 NOTE — Anesthesia Preprocedure Evaluation (Addendum)
Anesthesia Evaluation  Patient identified by MRN, date of birth, ID band Patient awake    Reviewed: Allergy & Precautions, H&P , NPO status , Patient's Chart, lab work & pertinent test results, reviewed documented beta blocker date and time   Airway Mallampati: II  TM Distance: >3 FB Neck ROM: full    Dental no notable dental hx.    Pulmonary shortness of breath, asthma , COPD,    Pulmonary exam normal breath sounds clear to auscultation       Cardiovascular Exercise Tolerance: Good hypertension, negative cardio ROS   Rhythm:regular Rate:Normal     Neuro/Psych negative neurological ROS  negative psych ROS   GI/Hepatic Neg liver ROS, GERD  Medicated,  Endo/Other  diabetes, Type 2  Renal/GU CRFRenal disease  negative genitourinary   Musculoskeletal   Abdominal   Peds  Hematology  (+) anemia ,   Anesthesia Other Findings   Reproductive/Obstetrics negative OB ROS                             Anesthesia Physical Anesthesia Plan  ASA: III  Anesthesia Plan: General   Post-op Pain Management:    Induction:   PONV Risk Score and Plan: Treatment may vary due to age or medical condition  Airway Management Planned:   Additional Equipment:   Intra-op Plan:   Post-operative Plan:   Informed Consent: I have reviewed the patients History and Physical, chart, labs and discussed the procedure including the risks, benefits and alternatives for the proposed anesthesia with the patient or authorized representative who has indicated his/her understanding and acceptance.   Dental Advisory Given  Plan Discussed with: CRNA  Anesthesia Plan Comments:        Anesthesia Quick Evaluation

## 2016-11-12 NOTE — Op Note (Signed)
[facility] Gastroenterology Patient Name: Mary Parks Procedure Date: 11/12/2016 7:14 AM MRN: [id number] Account #: [id number] Date of Birth: Jun 08, 1950 Admit Type: Outpatient Age: 66 Room: [facility] OR ROOM 01 Gender: Female Note Status: Finalized Procedure:            Colonoscopy Indications:          Screening for colorectal malignant neoplasm Providers:            [name] MD, MD Referring MD:         Kathrine Haddock (Referring MD) Medicines:            Propofol per Anesthesia Complications:        No immediate complications. Procedure:            Pre-Anesthesia Assessment:                       - Prior to the procedure, a History and Physical was                        performed, and patient medications and allergies were                        reviewed. The patient's tolerance of previous                        anesthesia was also reviewed. The risks and benefits of                        the procedure and the sedation options and risks were                        discussed with the patient. All questions were                        answered, and informed consent was obtained. Prior                        Anticoagulants: The patient has taken no previous                        anticoagulant or antiplatelet agents. ASA Grade                        Assessment: II - A patient with mild systemic disease.                        After reviewing the risks and benefits, the patient was                        deemed in satisfactory condition to undergo the                        procedure.                       After obtaining informed consent, the colonoscope was                        passed under direct vision. Throughout the procedure,  the patient's blood pressure, pulse, and oxygen                        saturations were monitored continuously. The Olympus CF                        H180AL Colonoscope (S#: [id number]) was introduced through                       the anus and advanced to the the cecum, identified by                        appendiceal orifice and ileocecal valve. The                        colonoscopy was performed without difficulty. The                        patient tolerated the procedure well. The quality of                        the bowel preparation was excellent. Findings:      The perianal and digital rectal examinations were normal.      A 8 mm polyp was found in the cecum. The polyp was sessile. The polyp       was removed with a cold snare. Resection and retrieval were complete.      A 5 mm polyp was found in the ascending colon. The polyp was sessile.       The polyp was removed with a cold snare. Resection and retrieval were       complete.      Five semi-sessile polyps were found in the descending colon. The polyps       were 3 to 4 mm in size. These polyps were removed with a cold biopsy       forceps. Resection and retrieval were complete. Impression:           - One 8 mm polyp in the cecum, removed with a cold                        snare. Resected and retrieved.                       - One 5 mm polyp in the ascending colon, removed with a                        cold snare. Resected and retrieved.                       - Five 3 to 4 mm polyps in the descending colon,                        removed with a cold biopsy forceps. Resected and                        retrieved. Recommendation:       - Await pathology results.                       - Discharge patient to home.                       -  Resume previous diet.                       - Continue present medications. Procedure Code(s):    --- Professional ---                       [phone], Colonoscopy, flexible; with removal of tumor(s),                        polyp(s), or other lesion(s) by snare technique                       [id number], 55, Colonoscopy, flexible; with biopsy, single                        or multiple Diagnosis Code(s):    ---  Professional ---                       Z12.11, Encounter for screening for malignant neoplasm                        of colon                       D12.0, Benign neoplasm of cecum                       D12.2, Benign neoplasm of ascending colon                       D12.4, Benign neoplasm of descending colon CPT copyright 2016 American Medical Association. All rights reserved. The codes documented in this report are preliminary and upon coder review may  be revised to meet current compliance requirements. [name] MD, MD 11/12/2016 8:03:20 AM This report has been signed electronically. Number of Addenda: 0 Note Initiated On: 11/12/2016 7:14 AM Scope Withdrawal Time: 0 hours 10 minutes 21 seconds  Total Procedure Duration: 0 hours 12 minutes 18 seconds       [facility]

## 2016-11-12 NOTE — Anesthesia Procedure Notes (Signed)
Performed by: Melquisedec Journey Pre-anesthesia Checklist: Patient identified, Emergency Drugs available, Suction available, Timeout performed and Patient being monitored Patient Re-evaluated:Patient Re-evaluated prior to induction Oxygen Delivery Method: Nasal cannula Placement Confirmation: positive ETCO2       

## 2016-11-12 NOTE — Transfer of Care (Signed)
Immediate Anesthesia Transfer of Care Note  Patient: Mary Parks  Procedure(s) Performed: Procedure(s) with comments: COLONOSCOPY WITH PROPOFOL (N/A) - Diabetic - insulin POLYPECTOMY INTESTINAL  Patient Location: PACU  Anesthesia Type: General  Level of Consciousness: awake, alert  and patient cooperative  Airway and Oxygen Therapy: Patient Spontanous Breathing and Patient connected to supplemental oxygen  Post-op Assessment: Post-op Vital signs reviewed, Patient's Cardiovascular Status Stable, Respiratory Function Stable, Patent Airway and No signs of Nausea or vomiting  Post-op Vital Signs: Reviewed and stable  Complications: No apparent anesthesia complications

## 2016-11-12 NOTE — Anesthesia Postprocedure Evaluation (Signed)
Anesthesia Post Note  Patient: Mary Parks  Procedure(s) Performed: Procedure(s) (LRB): COLONOSCOPY WITH PROPOFOL (N/A) POLYPECTOMY INTESTINAL  Patient location during evaluation: PACU Anesthesia Type: General Level of consciousness: awake and alert Pain management: pain level controlled Vital Signs Assessment: post-procedure vital signs reviewed and stable Respiratory status: spontaneous breathing, nonlabored ventilation and respiratory function stable Cardiovascular status: blood pressure returned to baseline and stable Postop Assessment: no signs of nausea or vomiting Anesthetic complications: no    DANIEL D KOVACS

## 2016-11-15 ENCOUNTER — Encounter: Payer: Self-pay | Admitting: Gastroenterology

## 2016-11-16 ENCOUNTER — Other Ambulatory Visit: Payer: Self-pay | Admitting: Unknown Physician Specialty

## 2016-11-18 ENCOUNTER — Encounter: Payer: Self-pay | Admitting: Gastroenterology

## 2016-11-26 ENCOUNTER — Ambulatory Visit (INDEPENDENT_AMBULATORY_CARE_PROVIDER_SITE_OTHER): Payer: Medicare Other

## 2016-11-26 VITALS — BP 120/60 | HR 72 | Temp 97.6°F | Resp 16 | Ht 65.0 in | Wt 195.5 lb

## 2016-11-26 DIAGNOSIS — Z0000 Encounter for general adult medical examination without abnormal findings: Secondary | ICD-10-CM | POA: Diagnosis not present

## 2016-11-26 NOTE — Progress Notes (Signed)
Subjective:   Mary Parks is a 66 y.o. female who presents for Medicare Annual (Subsequent) preventive examination.  Review of Systems:   Cardiac Risk Factors include: advanced age (>31men, >92 women);diabetes mellitus;hypertension;obesity (BMI >30kg/m2)     Objective:     Vitals: BP 120/60 (BP Location: Left Arm, Patient Position: Sitting)   Pulse 72   Temp 97.6 F (36.4 C)   Resp 16   Ht 5\' 5"  (1.651 m)   Wt 195 lb 8 oz (88.7 kg)   LMP  (LMP Unknown)   BMI 32.53 kg/m   Body mass index is 32.53 kg/m.   Tobacco History  Smoking Status  . Never Smoker  Smokeless Tobacco  . Never Used     Counseling given: Not Answered   Past Medical History:  Diagnosis Date  . Asthma   . Chest pain   . DM2 (diabetes mellitus, type 2) ([place]) 1987  . Dyspnea    Cath normal coronaries. normal EF. RA 11. PA 37/17 (26) LVEDP 36  . GERD (gastroesophageal reflux disease)   . HTN (hypertension)   . Hyperlipidemia   . Obesity   . Osteoarthritis    knees  . Renal disease   . Thyroid disease    having thyroid removed 04/14/15  . Vertigo    none recently   Past Surgical History:  Procedure Laterality Date  . CARDIAC CATHETERIZATION    . CATARACT EXTRACTION W/PHACO Right 03/31/2015   Procedure: CATARACT EXTRACTION PHACO AND INTRAOCULAR LENS PLACEMENT (IOC);  Surgeon: Ronnell Freshwater, MD;  Location: [place];  Service: Ophthalmology;  Laterality: Right;  DIABETIC - insulin  . COLONOSCOPY WITH PROPOFOL N/A 11/12/2016   Procedure: COLONOSCOPY WITH PROPOFOL;  Surgeon: [name], MD;  Location: [place] [facility];  Service: Endoscopy;  Laterality: N/A;  Diabetic - insulin  . EYE SURGERY    . HERNIA REPAIR     umbilical  . POLYPECTOMY  11/12/2016   Procedure: POLYPECTOMY INTESTINAL;  Surgeon: [name], MD;  Location: [place];  Service: Endoscopy;;  . THYROIDECTOMY N/A 04/14/2015   Procedure: THYROIDECTOMY;  Surgeon: Margaretha Sheffield, MD;  Location:  [facility];  Service: ENT;  Laterality: N/A;   Family History  Problem Relation Age of Onset  . Diabetes Mother   . Diabetes Father   . Diabetes Sister   . Diabetes Brother   . Diabetes Sister   . Diabetes Sister   . Diabetes Maternal Grandmother   . Diabetes Maternal Grandfather   . Diabetes Paternal Grandmother   . Diabetes Paternal Grandfather   . Heart disease Neg Hx   . Coronary artery disease Neg Hx   . Breast cancer Neg Hx    History  Sexual Activity  . Sexual activity: No    Outpatient Encounter Prescriptions as of 11/26/2016  Medication Sig  . aspirin EC 81 MG tablet Take 81 mg by mouth daily.  Marland Kitchen atorvastatin (LIPITOR) 40 MG tablet TAKE ONE TABLET BY MOUTH AT BEDTIME  . budesonide-formoterol (SYMBICORT) 160-4.5 MCG/ACT inhaler Inhale 2 puffs into the lungs 2 (two) times daily.  . calcium-vitamin D (OSCAL WITH D) 500-200 MG-UNIT tablet Take 1 tablet by mouth.  . cetirizine (ZYRTEC) 10 MG tablet Take 10 mg by mouth daily.  . fluticasone (FLONASE) 50 MCG/ACT nasal spray Place 2 sprays into both nostrils daily.  Marland Kitchen HUMALOG KWIKPEN 100 UNIT/ML KiwkPen INJECT 20 UNITS BEFORE BREAKFAST, 20 UNITS BEFORE LUNCH AND 30 UNITS BEFORE EVENING MEAL  . insulin glargine (  LANTUS) 100 UNIT/ML injection Inject 35 Units into the skin at bedtime.  Marland Kitchen levothyroxine (SYNTHROID, LEVOTHROID) 100 MCG tablet Take 100 mcg by mouth daily.  Marland Kitchen lisinopril (PRINIVIL,ZESTRIL) 20 MG tablet TAKE ONE TABLET BY MOUTH ONCE DAILY  . metoprolol tartrate (LOPRESSOR) 25 MG tablet Take 25 mg by mouth 2 (two) times daily.  . montelukast (SINGULAIR) 10 MG tablet TAKE ONE TABLET BY MOUTH ONCE DAILY  . omeprazole (PRILOSEC) 20 MG capsule Take 1 capsule (20 mg total) by mouth daily.  . ONE TOUCH ULTRA TEST test strip 1 each by Other route 2 (two) times daily.   [name] DELICA LANCETS 98Y MISC 2 (two) times daily.   . pregabalin (LYRICA) 100 MG capsule Take 1 capsule (100 mg total) by mouth daily.  . TRULICITY 1.5  ME/1.5AX SOPN INJECT 1.5MG  (1 SYRINGE) INTO THE SKIN ONCE A WEEK  . traMADol (ULTRAM) 50 MG tablet Take 1 tablet (50 mg total) by mouth every 6 (six) hours as needed. (Patient not taking: Reported on 11/26/2016)  . [DISCONTINUED] insulin degludec (TRESIBA FLEXTOUCH) 100 UNIT/ML SOPN FlexTouch Pen Inject 40-60 units SubQ at bed time (Patient not taking: Reported on 11/12/2016)   No facility-administered encounter medications on file as of 11/26/2016.     Activities of Daily Living In your present state of health, do you have any difficulty performing the following activities: 11/26/2016 11/12/2016  Hearing? N N  Vision? N N  Difficulty concentrating or making decisions? N N  Walking or climbing stairs? N N  Dressing or bathing? N N  Doing errands, shopping? N -  Preparing Food and eating ? N -  Using the Toilet? N -  In the past six months, have you accidently leaked urine? N -  Do you have problems with loss of bowel control? N -  Managing your Medications? N -  Managing your Finances? N -  Housekeeping or managing your Housekeeping? N -  Some recent data might be hidden    Patient Care Team: Kathrine Haddock, NP as PCP - General (Nurse Practitioner)    Assessment:     Exercise Activities and Dietary recommendations Current Exercise Habits: Home exercise routine, Time (Minutes): 30, Frequency (Times/Week): 3, Weekly Exercise (Minutes/Week): 90, Intensity: Mild, Exercise limited by: None identified  Goals    . Increase water intake          Recommend drinking at least 4-5 glasses of water a day       Fall Risk Fall Risk  11/26/2016 10/03/2015  Falls in the past year? No No   Depression Screen PHQ 2/9 Scores 11/26/2016 10/03/2015  PHQ - 2 Score 0 0     Cognitive Function     6CIT Screen 11/26/2016  What Year? 0 points  What month? 0 points  What time? 0 points  Count back from 20 0 points  Months in reverse 0 points  Repeat phrase 0 points  Total Score 0    Immunization  History  Administered Date(s) Administered  . Influenza,inj,Quad PF,36+ Mos 03/25/2015  . Influenza-Unspecified 02/21/2014, 03/31/2016  . Pneumococcal Conjugate-13 09/26/2013  . Pneumococcal Polysaccharide-23 03/13/2008, 03/25/2015  . Td 03/13/2008   Screening Tests Health Maintenance  Topic Date Due  . OPHTHALMOLOGY EXAM  11/16/2016  . INFLUENZA VACCINE  12/22/2016  . HEMOGLOBIN A1C  12/23/2016  . FOOT EXAM  06/28/2017  . TETANUS/TDAP  03/13/2018  . MAMMOGRAM  07/20/2018  . COLONOSCOPY  11/12/2021  . DEXA SCAN  Addressed  . Hepatitis C  Screening  Completed  . PNA vac Low Risk Adult  Completed      Plan:    I have personally reviewed and addressed the Medicare Annual Wellness questionnaire and have noted the following in the patient's chart:  A. Medical and social history B. Use of alcohol, tobacco or illicit drugs  C. Current medications and supplements D. Functional ability and status E.  Nutritional status F.  Physical activity G. Advance directives H. List of other physicians I.  Hospitalizations, surgeries, and ER visits in previous 12 months J.  [place] such as hearing and vision if needed, cognitive and depression L. Referrals and appointments   In addition, I have reviewed and discussed with patient certain preventive protocols, quality metrics, and best practice recommendations. A written personalized care plan for preventive services as well as general preventive health recommendations were provided to patient.   Signed,  Tyler Aas, LPN Nurse Health Advisor   MD Recommendations: none

## 2016-11-26 NOTE — Patient Instructions (Signed)
Mary Parks , Thank you for taking time to come for your Medicare Wellness Visit. I appreciate your ongoing commitment to your health goals. Please review the following plan we discussed and let me know if I can assist you in the future.   Screening recommendations/referrals: Colonoscopy: Completed 11/12/2016 Mammogram: Completed 07/29/2016 Bone Density: Completed 01/20/2010 Recommended yearly ophthalmology/optometry visit for glaucoma screening and checkup Recommended yearly dental visit for hygiene and checkup  Vaccinations: Influenza vaccine: Up to date, due 03/2017 Pneumococcal vaccine: Up to date  Tdap vaccine: up to date  Shingles vaccine: due, check with your insurance company for coverage  Advanced directives: Advance directive discussed with you today. I have provided a copy for you to complete at home and have notarized. Once this is complete please bring a copy in to our office so we can scan it into your chart.  Conditions/risks identified: Recommend drinking at least 4-5 glasses of water a day   Next appointment: Follow up on 12/24/2016 at 10:30am with Mary Parks. Follow up in one year for your annual wellness exam.    Preventive Care 65 Years and Older, Female Preventive care refers to lifestyle choices and visits with your health care provider that can promote health and wellness. What does preventive care include?  A yearly physical exam. This is also called an annual well check.  Dental exams once or twice a year.  Routine eye exams. Ask your health care provider how often you should have your eyes checked.  Personal lifestyle choices, including:  Daily care of your teeth and gums.  Regular physical activity.  Eating a healthy diet.  Avoiding tobacco and drug use.  Limiting alcohol use.  Practicing safe sex.  Taking low-dose aspirin every day.  Taking vitamin and mineral supplements as recommended by your health care provider. What happens during an  annual well check? The services and screenings done by your health care provider during your annual well check will depend on your age, overall health, lifestyle risk factors, and family history of disease. Counseling  Your health care provider may ask you questions about your:  Alcohol use.  Tobacco use.  Drug use.  Emotional well-being.  Home and relationship well-being.  Sexual activity.  Eating habits.  History of falls.  Memory and ability to understand (cognition).  Work and work Statistician.  Reproductive health. Screening  You may have the following tests or measurements:  Height, weight, and BMI.  Blood pressure.  Lipid and cholesterol levels. These may be checked every 5 years, or more frequently if you are over 80 years old.  Skin check.  Lung cancer screening. You may have this screening every year starting at age 40 if you have a 30-pack-year history of smoking and currently smoke or have quit within the past 15 years.  Fecal occult blood test (FOBT) of the stool. You may have this test every year starting at age 58.  Flexible sigmoidoscopy or colonoscopy. You may have a sigmoidoscopy every 5 years or a colonoscopy every 10 years starting at age 70.  Hepatitis C blood test.  Hepatitis B blood test.  Sexually transmitted disease (STD) testing.  Diabetes screening. This is done by checking your blood sugar (glucose) after you have not eaten for a while (fasting). You may have this done every 1-3 years.  Bone density scan. This is done to screen for osteoporosis. You may have this done starting at age 71.  Mammogram. This may be done every 1-2 years. Talk to your  health care provider about how often you should have regular mammograms. Talk with your health care provider about your test results, treatment options, and if necessary, the need for more tests. Vaccines  Your health care provider may recommend certain vaccines, such as:  Influenza  vaccine. This is recommended every year.  Tetanus, diphtheria, and acellular pertussis (Tdap, Td) vaccine. You may need a Td booster every 10 years.  Zoster vaccine. You may need this after age 38.  Pneumococcal 13-valent conjugate (PCV13) vaccine. One dose is recommended after age 45.  Pneumococcal polysaccharide (PPSV23) vaccine. One dose is recommended after age 80. Talk to your health care provider about which screenings and vaccines you need and how often you need them. This information is not intended to replace advice given to you by your health care provider. Make sure you discuss any questions you have with your health care provider. Document Released: 06/06/2015 Document Revised: 01/28/2016 Document Reviewed: 03/11/2015 Elsevier Interactive Patient Education  2017 [place] Prevention in the Home Falls can cause injuries. They can happen to people of all ages. There are many things you can do to make your home safe and to help prevent falls. What can I do on the outside of my home?  Regularly fix the edges of walkways and driveways and fix any cracks.  Remove anything that might make you trip as you walk through a door, such as a raised step or threshold.  Trim any bushes or trees on the path to your home.  Use bright outdoor lighting.  Clear any walking paths of anything that might make someone trip, such as rocks or tools.  Regularly check to see if handrails are loose or broken. Make sure that both sides of any steps have handrails.  Any raised decks and porches should have guardrails on the edges.  Have any leaves, snow, or ice cleared regularly.  Use sand or salt on walking paths during winter.  Clean up any spills in your garage right away. This includes oil or grease spills. What can I do in the bathroom?  Use night lights.  Install grab bars by the toilet and in the tub and shower. Do not use towel bars as grab bars.  Use non-skid mats or decals  in the tub or shower.  If you need to sit down in the shower, use a plastic, non-slip stool.  Keep the floor dry. Clean up any water that spills on the floor as soon as it happens.  Remove soap buildup in the tub or shower regularly.  Attach bath mats securely with double-sided non-slip rug tape.  Do not have throw rugs and other things on the floor that can make you trip. What can I do in the bedroom?  Use night lights.  Make sure that you have a light by your bed that is easy to reach.  Do not use any sheets or blankets that are too big for your bed. They should not hang down onto the floor.  Have a firm chair that has side arms. You can use this for support while you get dressed.  Do not have throw rugs and other things on the floor that can make you trip. What can I do in the kitchen?  Clean up any spills right away.  Avoid walking on wet floors.  Keep items that you use a lot in easy-to-reach places.  If you need to reach something above you, use a strong step stool that has a  grab bar.  Keep electrical cords out of the way.  Do not use floor polish or wax that makes floors slippery. If you must use wax, use non-skid floor wax.  Do not have throw rugs and other things on the floor that can make you trip. What can I do with my stairs?  Do not leave any items on the stairs.  Make sure that there are handrails on both sides of the stairs and use them. Fix handrails that are broken or loose. Make sure that handrails are as long as the stairways.  Check any carpeting to make sure that it is firmly attached to the stairs. Fix any carpet that is loose or worn.  Avoid having throw rugs at the top or bottom of the stairs. If you do have throw rugs, attach them to the floor with carpet tape.  Make sure that you have a light switch at the top of the stairs and the bottom of the stairs. If you do not have them, ask someone to add them for you. What else can I do to help  prevent falls?  Wear shoes that:  Do not have high heels.  Have rubber bottoms.  Are comfortable and fit you well.  Are closed at the toe. Do not wear sandals.  If you use a stepladder:  Make sure that it is fully opened. Do not climb a closed stepladder.  Make sure that both sides of the stepladder are locked into place.  Ask someone to hold it for you, if possible.  Clearly mark and make sure that you can see:  Any grab bars or handrails.  First and last steps.  Where the edge of each step is.  Use tools that help you move around (mobility aids) if they are needed. These include:  Canes.  Walkers.  Scooters.  Crutches.  Turn on the lights when you go into a dark area. Replace any light bulbs as soon as they burn out.  Set up your furniture so you have a clear path. Avoid moving your furniture around.  If any of your floors are uneven, fix them.  If there are any pets around you, be aware of where they are.  Review your medicines with your doctor. Some medicines can make you feel dizzy. This can increase your chance of falling. Ask your doctor what other things that you can do to help prevent falls. This information is not intended to replace advice given to you by your health care provider. Make sure you discuss any questions you have with your health care provider. Document Released: 03/06/2009 Document Revised: 10/16/2015 Document Reviewed: 06/14/2014 Elsevier Interactive Patient Education  2017 [employer].

## 2016-12-13 ENCOUNTER — Other Ambulatory Visit: Payer: Self-pay | Admitting: Unknown Physician Specialty

## 2016-12-24 ENCOUNTER — Encounter: Payer: Self-pay | Admitting: Unknown Physician Specialty

## 2016-12-24 ENCOUNTER — Ambulatory Visit (INDEPENDENT_AMBULATORY_CARE_PROVIDER_SITE_OTHER): Payer: Medicare Other | Admitting: Unknown Physician Specialty

## 2016-12-24 VITALS — BP 160/89 | HR 71 | Temp 97.9°F | Wt 194.6 lb

## 2016-12-24 DIAGNOSIS — I129 Hypertensive chronic kidney disease with stage 1 through stage 4 chronic kidney disease, or unspecified chronic kidney disease: Secondary | ICD-10-CM | POA: Diagnosis not present

## 2016-12-24 DIAGNOSIS — J410 Simple chronic bronchitis: Secondary | ICD-10-CM

## 2016-12-24 DIAGNOSIS — E1140 Type 2 diabetes mellitus with diabetic neuropathy, unspecified: Secondary | ICD-10-CM

## 2016-12-24 DIAGNOSIS — Z794 Long term (current) use of insulin: Secondary | ICD-10-CM | POA: Diagnosis not present

## 2016-12-24 DIAGNOSIS — E1122 Type 2 diabetes mellitus with diabetic chronic kidney disease: Secondary | ICD-10-CM | POA: Diagnosis not present

## 2016-12-24 MED ORDER — BUDESONIDE-FORMOTEROL FUMARATE 160-4.5 MCG/ACT IN AERO: 2.0000 | INHALATION_SPRAY | Freq: Two times a day (BID) | RESPIRATORY_TRACT | 4 refills | Status: DC

## 2016-12-24 MED ORDER — ALBUTEROL SULFATE HFA 108 (90 BASE) MCG/ACT IN AERS: 2.0000 | INHALATION_SPRAY | Freq: Four times a day (QID) | RESPIRATORY_TRACT | 2 refills | Status: DC | PRN

## 2016-12-24 NOTE — Assessment & Plan Note (Signed)
Per Nephrology.  Notes that are sent were reviewed.

## 2016-12-24 NOTE — Progress Notes (Signed)
BP (!) 160/89 (BP Location: Left Arm, Cuff Size: Normal)   Pulse 71   Temp 97.9 F (36.6 C)   Wt 194 lb 9.6 oz (88.3 kg)   LMP  (LMP Unknown)   SpO2 97%   BMI 32.38 kg/m    Subjective:    Patient ID: Mary Parks, female    DOB: 03/09/1951, 66 y.o.   MRN: [id number]  HPI: Mary Parks is a 66 y.o. female  Chief Complaint  Patient presents with  . Diabetes    pt states last eye exam in chart is correct, states she needs to find a new doctor   . Hyperlipidemia  . Hypertension   Diabetes: Per Nephrology Takes Lantus 20u in AM and 34u at night.  Takes 20-15-20 of Humalog with meals.    Hypertension  Using medications without difficulty.  States it was "normal" at Nephrologist but after reading the note, elevated today.   Average home BPs of SBP of 120   Using medication without problems or lightheadedness No chest pain with exertion or shortness of breath No Edema  Elevated Cholesterol Using medications without problems Pt reports Endocrine is checking her cholesterol No Muscle aches   COPD Doing well until "all this rain started."  Taking Symbicort BID.  Does not have a rescue inhaler  Relevant past medical, surgical, family and social history reviewed and updated as indicated. Interim medical history since our last visit reviewed. Allergies and medications reviewed and updated.  Review of Systems  Per HPI unless specifically indicated above     Objective:    BP (!) 160/89 (BP Location: Left Arm, Cuff Size: Normal)   Pulse 71   Temp 97.9 F (36.6 C)   Wt 194 lb 9.6 oz (88.3 kg)   LMP  (LMP Unknown)   SpO2 97%   BMI 32.38 kg/m   Wt Readings from Last 3 Encounters:  12/24/16 194 lb 9.6 oz (88.3 kg)  11/26/16 195 lb 8 oz (88.7 kg)  11/12/16 190 lb (86.2 kg)    Physical Exam  Constitutional: She is oriented to person, place, and time. She appears well-developed and well-nourished. No distress.  HENT:  Head: Normocephalic and atraumatic.  Eyes:  Conjunctivae and lids are normal. Right eye exhibits no discharge. Left eye exhibits no discharge. No scleral icterus.  Neck: Normal range of motion. Neck supple. No JVD present. Carotid bruit is not present.  Cardiovascular: Normal rate, regular rhythm and normal heart sounds.   Pulmonary/Chest: Effort normal and breath sounds normal.  Abdominal: Normal appearance. There is no splenomegaly or hepatomegaly.  Musculoskeletal: Normal range of motion.  Neurological: She is alert and oriented to person, place, and time.  Skin: Skin is warm, dry and intact. No rash noted. No pallor.  Psychiatric: She has a normal mood and affect. Her behavior is normal. Judgment and thought content normal.    Results for orders placed or performed during the hospital encounter of 11/12/16  Glucose, capillary  Result Value Ref Range   Glucose-Capillary 136 (H) 65 - 99 mg/dL  Glucose, capillary  Result Value Ref Range   Glucose-Capillary 131 (H) 65 - 99 mg/dL      Assessment & Plan:   Problem List Items Addressed This Visit      Unprioritized   COPD (chronic obstructive pulmonary disease) ([place]) - Primary    Refill Symbicort.  Rx for rescue inhaler and instructions on use.  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  Nephrology does not seem concerned with review of chart.  Will recheck in 6 months      Type 2 diabetes mellitus with diabetic neuropathy, with long-term current use of insulin [facility])    Per Nephrology.  Notes that are sent were reviewed.            Follow up plan: Return in about 6 months (around 06/26/2017)  for also needs physical.

## 2016-12-24 NOTE — Assessment & Plan Note (Addendum)
Refill Symbicort.  Rx for rescue inhaler and instructions on use.  Spirometry showing moderate restriction

## 2016-12-24 NOTE — Assessment & Plan Note (Signed)
High today but good numbers at home.  Nephrology does not seem concerned with review of chart.  Will recheck in 6 months

## 2017-01-14 DIAGNOSIS — I10 Essential (primary) hypertension: Secondary | ICD-10-CM | POA: Diagnosis not present

## 2017-01-14 DIAGNOSIS — K219 Gastro-esophageal reflux disease without esophagitis: Secondary | ICD-10-CM | POA: Diagnosis not present

## 2017-01-14 DIAGNOSIS — E559 Vitamin D deficiency, unspecified: Secondary | ICD-10-CM | POA: Diagnosis not present

## 2017-01-14 DIAGNOSIS — N289 Disorder of kidney and ureter, unspecified: Secondary | ICD-10-CM | POA: Diagnosis not present

## 2017-01-14 DIAGNOSIS — E1165 Type 2 diabetes mellitus with hyperglycemia: Secondary | ICD-10-CM | POA: Diagnosis not present

## 2017-01-14 DIAGNOSIS — I158 Other secondary hypertension: Secondary | ICD-10-CM | POA: Diagnosis not present

## 2017-02-01 ENCOUNTER — Ambulatory Visit (INDEPENDENT_AMBULATORY_CARE_PROVIDER_SITE_OTHER): Payer: Medicare Other | Admitting: Unknown Physician Specialty

## 2017-02-01 ENCOUNTER — Encounter: Payer: Self-pay | Admitting: Unknown Physician Specialty

## 2017-02-01 DIAGNOSIS — E1140 Type 2 diabetes mellitus with diabetic neuropathy, unspecified: Secondary | ICD-10-CM | POA: Diagnosis not present

## 2017-02-01 DIAGNOSIS — E1122 Type 2 diabetes mellitus with diabetic chronic kidney disease: Secondary | ICD-10-CM

## 2017-02-01 DIAGNOSIS — I129 Hypertensive chronic kidney disease with stage 1 through stage 4 chronic kidney disease, or unspecified chronic kidney disease: Secondary | ICD-10-CM | POA: Diagnosis not present

## 2017-02-01 DIAGNOSIS — M170 Bilateral primary osteoarthritis of knee: Secondary | ICD-10-CM | POA: Diagnosis not present

## 2017-02-01 DIAGNOSIS — Z794 Long term (current) use of insulin: Secondary | ICD-10-CM

## 2017-02-01 MED ORDER — TRIAMCINOLONE ACETONIDE 40 MG/ML IJ SUSP
40.0000 mg | Freq: Once | INTRAMUSCULAR | Status: AC
  Administered 2017-02-01: 40 mg via INTRAMUSCULAR

## 2017-02-01 NOTE — Assessment & Plan Note (Signed)
Good numbers at home.  Continue present medication

## 2017-02-01 NOTE — Assessment & Plan Note (Signed)
Per endocrine.  They are checking A1Cs

## 2017-02-01 NOTE — Assessment & Plan Note (Signed)
STEROID INJECTION  Procedure: Knee Intraarticular Steroid Injection   Description: After verbal consent and patient ed on Area prepped and draped using  semi-sterile technique. Using a anterior  approach, a mixture of 4 cc of  1% Marcaine & 1 cc of Kenalog 40 was injected into knee joint.  A bandage was then placed over the injection site. Complications:  none Post Procedure Instructions: To the ER if any symptoms of erythema or swelling.   Follow Up: prn

## 2017-02-01 NOTE — Progress Notes (Signed)
BP (!) 159/88 (BP Location: Left Arm, Cuff Size: Normal)   Pulse 69   Temp 98.3 F (36.8 C)   Wt 193 lb (87.5 kg)   LMP  (LMP Unknown)   SpO2 98%   BMI 32.12 kg/m    Subjective:    Patient ID: Mary Parks, female    DOB: [birth year], 66 y.o.   MRN: [id number]  HPI: Mary Parks is a 66 y.o. female  Chief Complaint  Patient presents with  . Diabetes    pt states she needs to have her A1C checked per her insurance company  . Knee Pain    pt states that her right knee is hurting, would like injection if possible    DM Taking 20 u Lantus in the AM and 34 u at night.  Taking 30/10/30 Humalon with meals Seeing Endocrine.  Insurance may not have access to his records.  Getting regular F/u  Hypertension  Taking Metoprolol  Average home BPs 120-130 at home                         Using medication without problems or lightheadedness No chest pain with exertion or shortness of breath No Edema  Knee pain Right knee.  Wants an injection.  Last injection was February  Relevant past medical, surgical, family and social history reviewed and updated as indicated. Interim medical history since our last visit reviewed. Allergies and medications reviewed and updated.  Review of Systems  Per HPI unless specifically indicated above     Objective:    BP (!) 159/88 (BP Location: Left Arm, Cuff Size: Normal)   Pulse 69   Temp 98.3 F (36.8 C)   Wt 193 lb (87.5 kg)   LMP  (LMP Unknown)   SpO2 98%   BMI 32.12 kg/m   Wt Readings from Last 3 Encounters:  02/01/17 193 lb (87.5 kg)  12/24/16 194 lb 9.6 oz (88.3 kg)  11/26/16 195 lb 8 oz (88.7 kg)    Physical Exam  Constitutional: She is oriented to person, place, and time. She appears well-developed and well-nourished. No distress.  HENT:  Head: Normocephalic and atraumatic.  Eyes: Conjunctivae and lids are normal. Right eye exhibits no discharge. Left eye exhibits no discharge. No scleral icterus.  Neck: Normal range of  motion. Neck supple. No JVD present. Carotid bruit is not present.  Cardiovascular: Normal rate, regular rhythm and normal heart sounds.   Pulmonary/Chest: Effort normal and breath sounds normal.  Abdominal: Normal appearance. There is no splenomegaly or hepatomegaly.  Musculoskeletal:       Right knee: She exhibits decreased range of motion and swelling. She exhibits no effusion and no erythema. Tenderness found. Medial joint line tenderness noted.  Neurological: She is alert and oriented to person, place, and time.  Skin: Skin is warm, dry and intact. No rash noted. No pallor.  Psychiatric: She has a normal mood and affect. Her behavior is normal. Judgment and thought content normal.    Results for orders placed or performed during the hospital encounter of 11/12/16  Glucose, capillary  Result Value Ref Range   Glucose-Capillary 136 (H) 65 - 99 mg/dL  Glucose, capillary  Result Value Ref Range   Glucose-Capillary 131 (H) 65 - 99 mg/dL      Assessment & Plan:   Problem List Items Addressed This Visit      Unprioritized   Hypertension associated with chronic kidney disease due to type  2 diabetes mellitus ([place])    Good numbers at home.  Continue present medication      OA (osteoarthritis) of knee    STEROID INJECTION  Procedure: Knee Intraarticular Steroid Injection   Description: After verbal consent and patient ed on Area prepped and draped using  semi-sterile technique. Using a anterior  approach, a mixture of 4 cc of  1% Marcaine & 1 cc of Kenalog 40 was injected into knee joint.  A bandage was then placed over the injection site. Complications:  none Post Procedure Instructions: To the ER if any symptoms of erythema or swelling.   Follow Up: prn       Type 2 diabetes mellitus with diabetic neuropathy, with long-term current use of insulin ([place])    Per endocrine.  They are checking A1Cs          Follow up plan: Return in about 3 months (around  05/03/2017).

## 2017-02-01 NOTE — Addendum Note (Signed)
Addended by: Georgina Peer on: 02/01/2017 12:05 PM   Modules accepted: Orders

## 2017-02-10 ENCOUNTER — Other Ambulatory Visit: Payer: Self-pay | Admitting: Unknown Physician Specialty

## 2017-03-15 ENCOUNTER — Ambulatory Visit: Payer: Self-pay

## 2017-03-15 ENCOUNTER — Ambulatory Visit
Admission: EM | Admit: 2017-03-15 | Discharge: 2017-03-15 | Disposition: A | Discharge status: Home / Self Care | Payer: Medicare Other | Attending: Family Medicine | Admitting: Family Medicine

## 2017-03-15 ENCOUNTER — Encounter: Payer: Self-pay | Admitting: *Deleted

## 2017-03-15 DIAGNOSIS — J019 Acute sinusitis, unspecified: Secondary | ICD-10-CM | POA: Diagnosis not present

## 2017-03-15 MED ORDER — DOXYCYCLINE HYCLATE 100 MG PO CAPS: 100.0000 mg | ORAL_CAPSULE | Freq: Two times a day (BID) | ORAL | 0 refills | Status: DC

## 2017-03-15 NOTE — Telephone Encounter (Signed)
This encounter was created in error - please disregard.

## 2017-03-15 NOTE — ED Triage Notes (Signed)
Patient started having right ear pain, sinus pressure and sinus drainage 4 days ago.

## 2017-03-15 NOTE — ED Provider Notes (Signed)
[facility]    CSN: [id number] Arrival date & time: 03/15/17  1122  History   Chief Complaint Chief Complaint  Patient presents with  . Otalgia  . Nasal Congestion   HPI  66 year old female with a history of sinusitis presents with the above complaints.  Patient states that she has been sick since Friday.  Has had severe sinus pressure and congestion.  Associated otalgia and itchy watery eyes.  No fever.  Mild cough.  Associated dizziness.  No known exacerbating relieving factors.  She is concerned that she has a recurrence of sinusitis.  No other complaints or concerns at this time.  Past Medical History:  Diagnosis Date  . Asthma   . Chest pain   . DM2 (diabetes mellitus, type 2) ([place]) 1987  . Dyspnea    Cath normal coronaries. normal EF. RA 11. PA 37/17 (26) LVEDP 36  . GERD (gastroesophageal reflux disease)   . HTN (hypertension)   . Hyperlipidemia   . Obesity   . Osteoarthritis    knees  . Renal disease   . Thyroid disease    having thyroid removed 04/14/15  . Vertigo    none recently    Patient Active Problem List   Diagnosis Date Noted  . Special screening for malignant neoplasms, colon   . Benign neoplasm of cecum   . Benign neoplasm of ascending colon   . Benign neoplasm of descending colon   . Allergic rhinitis 09/22/2016  . OA (osteoarthritis) of knee 06/25/2016  . Hypertension 10/03/2015  . Peripheral neuropathy 10/03/2015  . Uncontrolled type 2 diabetes mellitus with chronic kidney disease ([place]) 10/03/2015  . Type 2 diabetes mellitus with diabetic neuropathy, with long-term current use of insulin ([place]) 10/03/2015  . S/P total thyroidectomy 04/14/2015  . Vertigo, intermittent 04/04/2015  . Chronic kidney disease, stage 3 ([place]) 12/17/2014  . Hypertension associated with chronic kidney disease due to type 2 diabetes mellitus ([place]) 12/17/2014  . Diabetes mellitus with chronic kidney disease ([place]) 11/28/2014  . COPD (chronic obstructive  pulmonary disease) ([place]) 11/19/2014  . Allergic conjunctivitis 11/19/2014  . UNSPECIFIED ANEMIA 09/25/2008  . DYSPNEA 09/13/2008  . SNORING 09/13/2008    Past Surgical History:  Procedure Laterality Date  . CARDIAC CATHETERIZATION    . CATARACT EXTRACTION W/PHACO Right 03/31/2015   Procedure: CATARACT EXTRACTION PHACO AND INTRAOCULAR LENS PLACEMENT (IOC);  Surgeon: Ronnell Freshwater, MD;  Location: [place];  Service: Ophthalmology;  Laterality: Right;  DIABETIC - insulin  . COLONOSCOPY WITH PROPOFOL N/A 11/12/2016   Procedure: COLONOSCOPY WITH PROPOFOL;  Surgeon: [name], MD;  Location: [place];  Service: Endoscopy;  Laterality: N/A;  Diabetic - insulin  . EYE SURGERY    . HERNIA REPAIR     umbilical  . POLYPECTOMY  11/12/2016   Procedure: POLYPECTOMY INTESTINAL;  Surgeon: [name], MD;  Location: [place];  Service: Endoscopy;;  . THYROIDECTOMY N/A 04/14/2015   Procedure: THYROIDECTOMY;  Surgeon: Margaretha Sheffield, MD;  Location: [facility];  Service: ENT;  Laterality: N/A;    OB History    No data available       Home Medications    Prior to Admission medications   Medication Sig Start Date End Date Taking? Authorizing Provider  aspirin EC 81 MG tablet Take 81 mg by mouth daily.   Yes [provider]  atorvastatin (LIPITOR) 40 MG tablet TAKE 1 TABLET BY MOUTH AT BEDTIME 02/10/17  Yes Johnson, Megan P, DO  budesonide-formoterol (  SYMBICORT) 160-4.5 MCG/ACT inhaler Inhale 2 puffs into the lungs 2 (two) times daily. 12/24/16  Yes Kathrine Haddock, NP  calcium-vitamin D (OSCAL WITH D) 500-200 MG-UNIT tablet Take 1 tablet by mouth.   Yes [provider]  fluticasone (FLONASE) 50 MCG/ACT nasal spray Place 2 sprays into both nostrils daily. 06/25/16  Yes Kathrine Haddock, NP  HUMALOG KWIKPEN 100 UNIT/ML KiwkPen INJECT 20 UNITS BEFORE BREAKFAST, 20 UNITS BEFORE LUNCH AND 30 UNITS BEFORE EVENING MEAL 07/14/16  Yes Kathrine Haddock, NP    insulin glargine (LANTUS) 100 UNIT/ML injection Inject 35 Units into the skin at bedtime.   Yes [provider]  levocetirizine (XYZAL) 5 MG tablet Take 5 mg by mouth daily. 11/16/16  Yes [provider]  levothyroxine (SYNTHROID, LEVOTHROID) 100 MCG tablet Take 100 mcg by mouth daily. 07/02/15  Yes [provider]  lisinopril (PRINIVIL,ZESTRIL) 20 MG tablet TAKE ONE TABLET BY MOUTH ONCE DAILY 12/13/16  Yes Kathrine Haddock, NP  metoprolol tartrate (LOPRESSOR) 25 MG tablet Take 25 mg by mouth 2 (two) times daily. 07/09/16  Yes [provider]  montelukast (SINGULAIR) 10 MG tablet TAKE ONE TABLET BY MOUTH ONCE DAILY 10/08/16  Yes Johnson, Megan P, DO  omeprazole (PRILOSEC) 20 MG capsule Take 1 capsule (20 mg total) by mouth daily. 09/11/15  Yes Kathrine Haddock, NP  pregabalin (LYRICA) 100 MG capsule Take 1 capsule (100 mg total) by mouth daily. 06/25/16  Yes Kathrine Haddock, NP  TRULICITY 1.5 MW/1.0UV SOPN INJECT 1.5MG  (1 SYRINGE) INTO THE SKIN ONCE A WEEK 11/16/16  Yes Kathrine Haddock, NP  albuterol (PROVENTIL HFA;VENTOLIN HFA) 108 (90 Base) MCG/ACT inhaler Inhale 2 puffs into the lungs every 6 (six) hours as needed for wheezing or shortness of breath. 12/24/16   Kathrine Haddock, NP  cetirizine (ZYRTEC) 10 MG tablet Take 10 mg by mouth daily.    [provider]  doxycycline (VIBRAMYCIN) 100 MG capsule Take 1 capsule (100 mg total) by mouth 2 (two) times daily. 03/15/17   Avaeh Ewer G, DO  ONE TOUCH ULTRA TEST test strip 1 each by Other route 2 (two) times daily.  03/15/15   [provider]  Jonetta Speak LANCETS 25D MISC 2 (two) times daily.  03/15/15   [provider]  traMADol (ULTRAM) 50 MG tablet Take 1 tablet (50 mg total) by mouth every 6 (six) hours as needed. 07/26/16   Kathrine Haddock, NP    Family History Family History  Problem Relation Age of Onset  . Diabetes Mother   . Diabetes Father   . Diabetes Sister   . Diabetes Brother   .  Diabetes Sister   . Diabetes Sister   . Diabetes Maternal Grandmother   . Diabetes Maternal Grandfather   . Diabetes Paternal Grandmother   . Diabetes Paternal Grandfather   . Heart disease Neg Hx   . Coronary artery disease Neg Hx   . Breast cancer Neg Hx     Social History Social History  Substance Use Topics  . Smoking status: Never Smoker  . Smokeless tobacco: Never Used  . Alcohol use No     Allergies   Celebrex [celecoxib]; Sulfa antibiotics; Sulfisoxazole; and Sulfur   Review of Systems Review of Systems  Constitutional: Positive for fatigue. Negative for fever.  HENT: Positive for congestion, ear pain, sinus pain and sinus pressure.   Neurological: Positive for dizziness.   Physical Exam Triage Vital Signs ED Triage Vitals  Enc Vitals Group     BP 03/15/17  1134 (!) 166/74     Pulse Rate 03/15/17 1134 74     Resp 03/15/17 1134 16     Temp 03/15/17 1134 97.6 F (36.4 C)     Temp Source 03/15/17 1134 Oral     SpO2 03/15/17 1134 100 %     Weight 03/15/17 1135 194 lb (88 kg)     Height --      Head Circumference --      Peak Flow --      Pain Score 03/15/17 1136 4     Pain Loc --      Pain Edu? --      Excl. in [place]? --    Updated Vital Signs BP (!) 166/74 (BP Location: Left Arm)   Pulse 74   Temp 97.6 F (36.4 C) (Oral)   Resp 16   Wt 194 lb (88 kg)   LMP  (LMP Unknown)   SpO2 100%   BMI 32.28 kg/m   Physical Exam  Constitutional: She is oriented to person, place, and time. She appears well-developed. No distress.  HENT:  Head: Normocephalic and atraumatic.  Normal TMs bilaterally.  Maxillary sinus tenderness to palpation.  Eyes: Conjunctivae are normal. Right eye exhibits no discharge. Left eye exhibits no discharge.  Neck: Neck supple.  Cardiovascular: Normal rate and regular rhythm.   No murmur heard. Pulmonary/Chest: Effort normal and breath sounds normal. She has no wheezes. She has no rales.  Lymphadenopathy:    She has no cervical  adenopathy.  Neurological: She is alert and oriented to person, place, and time.  Psychiatric: She has a normal mood and affect.  Vitals reviewed.  UC Treatments / Results  Labs (all labs ordered are listed, but only abnormal results are displayed) Labs Reviewed - No data to display  EKG  EKG Interpretation None       Radiology No results found.  Procedures Procedures (including critical care time)  Medications Ordered in UC Medications - No data to display   Initial Impression / Assessment and Plan / UC Course  I have reviewed the triage vital signs and the nursing notes.  Pertinent labs & imaging results that were available during my care of the patient were reviewed by me and considered in my medical decision making (see chart for details).     66 year old female presents with sinusitis.  Treating with doxycycline.  Final Clinical Impressions(s) / UC Diagnoses   Final diagnoses:  Acute sinusitis, recurrence not specified, unspecified location    New Prescriptions New Prescriptions   DOXYCYCLINE (VIBRAMYCIN) 100 MG CAPSULE    Take 1 capsule (100 mg total) by mouth 2 (two) times daily.   Controlled Substance Prescriptions [place] Controlled Substance Registry consulted? Not Applicable   Coral Spikes, DO 03/15/17 1237

## 2017-03-15 NOTE — Discharge Instructions (Signed)
Antibiotic as prescribed.  Take care  Dr. Shontae Rosiles  

## 2017-03-15 NOTE — Telephone Encounter (Signed)
   Reason for Disposition . [1] Using nasal washes and pain medicine > 24 hours AND [2] sinus pain (around cheekbone or eye) persists  Answer Assessment - Initial Assessment Questions 1. LOCATION: "Where does it hurt?"      Ears, over my ears 2. ONSET: "When did the sinus pain start?"  (e.g., hours, days)      Started on Friday 3. SEVERITY: "How bad is the pain?"   (Scale 1-10; mild, moderate or severe)   - MILD (1-3): doesn't interfere with normal activities    - MODERATE (4-7): interferes with normal activities (e.g., work or school) or awakens from sleep   - SEVERE (8-10): excruciating pain and patient unable to do any normal activities        5 4. RECURRENT SYMPTOM: "Have you ever had sinus problems before?" If so, ask: "When was the last time?" and "What happened that time?"      All year round Dr office gave an antibiotic 5. NASAL CONGESTION: "Is the nose blocked?" If so, ask, "Can you open it or must you breathe through the mouth?"     no 6. NASAL DISCHARGE: "Do you have discharge from your nose?" If so ask, "What color?"     clear 7. FEVER: "Do you have a fever?" If so, ask: "What is it, how was it measured, and when did it start?"      No fever 8. OTHER SYMPTOMS: "Do you have any other symptoms?" (e.g., sore throat, cough, earache, difficulty breathing)     Sore throat earache 9. PREGNANCY: "Is there any chance you are pregnant?" "When was your last menstrual period?"     n/a  Protocols used: SINUS PAIN OR CONGESTION-A-AH  Pt wants to see PCP today but no appt available. Appt made for tomorrow at 1000 with Merrie Roof PA.

## 2017-03-16 ENCOUNTER — Ambulatory Visit: Payer: Self-pay | Admitting: Family Medicine

## 2017-04-02 ENCOUNTER — Encounter: Payer: Self-pay | Admitting: Gynecology

## 2017-04-02 ENCOUNTER — Ambulatory Visit
Admission: EM | Admit: 2017-04-02 | Discharge: 2017-04-02 | Disposition: A | Discharge status: Home / Self Care | Payer: Medicare Other | Attending: Family Medicine | Admitting: Family Medicine

## 2017-04-02 DIAGNOSIS — H6503 Acute serous otitis media, bilateral: Secondary | ICD-10-CM | POA: Diagnosis not present

## 2017-04-02 DIAGNOSIS — J019 Acute sinusitis, unspecified: Secondary | ICD-10-CM

## 2017-04-02 DIAGNOSIS — H6693 Otitis media, unspecified, bilateral: Secondary | ICD-10-CM

## 2017-04-02 MED ORDER — AMOXICILLIN-POT CLAVULANATE 875-125 MG PO TABS: 1.0000 | ORAL_TABLET | Freq: Two times a day (BID) | ORAL | 0 refills | Status: DC

## 2017-04-02 NOTE — ED Provider Notes (Signed)
[facility]    CSN: [id number] Arrival date & time: 04/02/17  7628     History   Chief Complaint Chief Complaint  Patient presents with  . Sinusitis    HPI Mary Parks is a 66 y.o. female.   Patient is a 66 year old female who presents with complaint of continued sinus pain and pressure. Patient was seen here 2 weeks ago diagnosis sinusitis and given a prescription for doxycycline. Patient states she feels a little bit better but still has symptoms. She does state that she does take a cetirizine daily allergy medicine but did not take it while she was taking her antibiotic. She does take some Flonase which helps some states she's been taking that for years patient does report continued ear pain, sinus pressure, pain behind her eyes, and congestion. Patient denies any fever, shortness of breath, or chest pain but does report some nausea.      Past Medical History:  Diagnosis Date  . Asthma   . Chest pain   . DM2 (diabetes mellitus, type 2) ([place]) 1987  . Dyspnea    Cath normal coronaries. normal EF. RA 11. PA 37/17 (26) LVEDP 36  . GERD (gastroesophageal reflux disease)   . HTN (hypertension)   . Hyperlipidemia   . Obesity   . Osteoarthritis    knees  . Renal disease   . Thyroid disease    having thyroid removed 04/14/15  . Vertigo    none recently    Patient Active Problem List   Diagnosis Date Noted  . Special screening for malignant neoplasms, colon   . Benign neoplasm of cecum   . Benign neoplasm of ascending colon   . Benign neoplasm of descending colon   . Allergic rhinitis 09/22/2016  . OA (osteoarthritis) of knee 06/25/2016  . Hypertension 10/03/2015  . Peripheral neuropathy 10/03/2015  . Uncontrolled type 2 diabetes mellitus with chronic kidney disease ([place]) 10/03/2015  . Type 2 diabetes mellitus with diabetic neuropathy, with long-term current use of insulin ([place]) 10/03/2015  . S/P total thyroidectomy 04/14/2015  . Vertigo,  intermittent 04/04/2015  . Chronic kidney disease, stage 3 ([place]) 12/17/2014  . Hypertension associated with chronic kidney disease due to type 2 diabetes mellitus ([place]) 12/17/2014  . Diabetes mellitus with chronic kidney disease ([place]) 11/28/2014  . COPD (chronic obstructive pulmonary disease) ([place]) 11/19/2014  . Allergic conjunctivitis 11/19/2014  . UNSPECIFIED ANEMIA 09/25/2008  . DYSPNEA 09/13/2008  . SNORING 09/13/2008    Past Surgical History:  Procedure Laterality Date  . CARDIAC CATHETERIZATION    . EYE SURGERY    . HERNIA REPAIR     umbilical    OB History    No data available       Home Medications    Prior to Admission medications   Medication Sig Start Date End Date Taking? Authorizing Provider  albuterol (PROVENTIL HFA;VENTOLIN HFA) 108 (90 Base) MCG/ACT inhaler Inhale 2 puffs into the lungs every 6 (six) hours as needed for wheezing or shortness of breath. 12/24/16  Yes Kathrine Haddock, NP  aspirin EC 81 MG tablet Take 81 mg by mouth daily.   Yes [provider]  atorvastatin (LIPITOR) 40 MG tablet TAKE 1 TABLET BY MOUTH AT BEDTIME 02/10/17  Yes Johnson, Megan P, DO  budesonide-formoterol (SYMBICORT) 160-4.5 MCG/ACT inhaler Inhale 2 puffs into the lungs 2 (two) times daily. 12/24/16  Yes Kathrine Haddock, NP  calcium-vitamin D (OSCAL WITH D) 500-200 MG-UNIT tablet Take 1 tablet by mouth.  Yes [provider]  cetirizine (ZYRTEC) 10 MG tablet Take 10 mg by mouth daily.   Yes [provider]  doxycycline (VIBRAMYCIN) 100 MG capsule Take 1 capsule (100 mg total) by mouth 2 (two) times daily. 03/15/17  Yes Cook, Jayce G, DO  fluticasone (FLONASE) 50 MCG/ACT nasal spray Place 2 sprays into both nostrils daily. 06/25/16  Yes Kathrine Haddock, NP  HUMALOG KWIKPEN 100 UNIT/ML KiwkPen INJECT 20 UNITS BEFORE BREAKFAST, 20 UNITS BEFORE LUNCH AND 30 UNITS BEFORE EVENING MEAL 07/14/16  Yes Kathrine Haddock, NP  insulin glargine (LANTUS) 100 UNIT/ML injection Inject  35 Units into the skin at bedtime.   Yes [provider]  levocetirizine (XYZAL) 5 MG tablet Take 5 mg by mouth daily. 11/16/16  Yes [provider]  levothyroxine (SYNTHROID, LEVOTHROID) 100 MCG tablet Take 100 mcg by mouth daily. 07/02/15  Yes [provider]  lisinopril (PRINIVIL,ZESTRIL) 20 MG tablet TAKE ONE TABLET BY MOUTH ONCE DAILY 12/13/16  Yes Kathrine Haddock, NP  metoprolol tartrate (LOPRESSOR) 25 MG tablet Take 25 mg by mouth 2 (two) times daily. 07/09/16  Yes [provider]  amoxicillin-clavulanate (AUGMENTIN) 875-125 MG tablet Take 1 tablet every 12 (twelve) hours by mouth. 04/02/17   Luvenia Redden, PA-C  montelukast (SINGULAIR) 10 MG tablet TAKE ONE TABLET BY MOUTH ONCE DAILY 10/08/16   Wynetta Emery, Megan P, DO  omeprazole (PRILOSEC) 20 MG capsule Take 1 capsule (20 mg total) by mouth daily. 09/11/15   Kathrine Haddock, NP  ONE TOUCH ULTRA TEST test strip 1 each by Other route 2 (two) times daily.  03/15/15   [provider]  Jonetta Speak LANCETS 25E MISC 2 (two) times daily.  03/15/15   [provider]  pregabalin (LYRICA) 100 MG capsule Take 1 capsule (100 mg total) by mouth daily. 06/25/16   Kathrine Haddock, NP  traMADol (ULTRAM) 50 MG tablet Take 1 tablet (50 mg total) by mouth every 6 (six) hours as needed. 07/26/16   Kathrine Haddock, NP  TRULICITY 1.5 NI/7.7OE SOPN INJECT 1.5MG  (1 SYRINGE) INTO THE SKIN ONCE A WEEK 11/16/16   Kathrine Haddock, NP    Family History Family History  Problem Relation Age of Onset  . Diabetes Mother   . Diabetes Father   . Diabetes Sister   . Diabetes Brother   . Diabetes Sister   . Diabetes Sister   . Diabetes Maternal Grandmother   . Diabetes Maternal Grandfather   . Diabetes Paternal Grandmother   . Diabetes Paternal Grandfather   . Heart disease Neg Hx   . Coronary artery disease Neg Hx   . Breast cancer Neg Hx     Social History Social History   Tobacco Use  . Smoking status: Never  Smoker  . Smokeless tobacco: Never Used  Substance Use Topics  . Alcohol use: No  . Drug use: No     Allergies   Celebrex [celecoxib]; Sulfa antibiotics; Sulfisoxazole; and Sulfur   Review of Systems Review of Systems  Has noted above in history of present illness. Other systems reviewed and found to be negative.   Physical Exam Triage Vital Signs ED Triage Vitals  Enc Vitals Group     BP 04/02/17 0837 135/75     Pulse Rate 04/02/17 0837 84     Resp 04/02/17 0837 16     Temp 04/02/17 0837 98.5 F (36.9 C)     Temp src --      SpO2 04/02/17 0837 98 %  Weight 04/02/17 0837 194 lb (88 kg)     Height --      Head Circumference --      Peak Flow --      Pain Score 04/02/17 0840 7     Pain Loc --      Pain Edu? --      Excl. in [place]? --    No data found.  Updated Vital Signs BP 135/75 (BP Location: Right Leg)   Pulse 84   Temp 98.5 F (36.9 C)   Resp 16   Wt 194 lb (88 kg)   LMP  (LMP Unknown)   SpO2 98%   BMI 32.28 kg/m   Visual Acuity Right Eye Distance:   Left Eye Distance:   Bilateral Distance:    Right Eye Near:   Left Eye Near:    Bilateral Near:     Physical Exam  Constitutional: She is oriented to person, place, and time. She appears well-developed and well-nourished. No distress.  HENT:  Right Ear: A middle ear effusion is present.  Left Ear: Ear canal normal. A middle ear effusion is present.  Nose: Right sinus exhibits maxillary sinus tenderness and frontal sinus tenderness. Left sinus exhibits maxillary sinus tenderness and frontal sinus tenderness.  Mouth/Throat: Uvula is midline.  Mild throat erythema and some nasal drainage.  Eyes: EOM are normal. Pupils are equal, round, and reactive to light.  Neck: Normal range of motion.  Cardiovascular: Normal rate, regular rhythm and normal heart sounds. Exam reveals no friction rub.  No murmur heard. Pulmonary/Chest: Effort normal and breath sounds normal. No respiratory distress.  Abdominal:  Soft. Bowel sounds are normal.  Musculoskeletal: Normal range of motion. She exhibits no edema.  Lymphadenopathy:    She has cervical adenopathy.  Neurological: She is alert and oriented to person, place, and time.  Skin: Skin is warm and dry. Capillary refill takes less than 2 seconds.  Psychiatric: She has a normal mood and affect. Her behavior is normal.     UC Treatments / Results  Labs (all labs ordered are listed, but only abnormal results are displayed) Labs Reviewed - No data to display  EKG  EKG Interpretation None       Radiology No results found.  Procedures Procedures (including critical care time)  Medications Ordered in UC Medications - No data to display   Initial Impression / Assessment and Plan / UC Course  I have reviewed the triage vital signs and the nursing notes.  Pertinent labs & imaging results that were available during my care of the patient were reviewed by me and considered in my medical decision making (see chart for details).    Patient with 2 weeks of sinus symptoms that continued despite treatment with doxycycline. Patient has seasonal allergy medication but has not used it while she was taken her antibiotic. Patient reports some help with antibiotic but only minimal.  Final Clinical Impressions(s) / UC Diagnoses   Final diagnoses:  Acute sinusitis, recurrence not specified, unspecified location  Bilateral otitis media, unspecified otitis media type   Patient given a prescription for Augmentin to her continued symptoms and recommended to push fluids. Also advised her to continue taking her daily allergy medications to help with her symptoms. Also patient given instruction for sinus rinse.   ED Discharge Orders        Ordered    amoxicillin-clavulanate (AUGMENTIN) 875-125 MG tablet  Every 12 hours     04/02/17 0926  Controlled Substance Prescriptions [place] Controlled Substance Registry consulted? Not Applicable   Luvenia Redden, PA-C 04/02/17 1404

## 2017-04-02 NOTE — Discharge Instructions (Signed)
-  Augmentin: one tablet twice a day until done -fluids -continue taking daily allergy medications to help with symptoms -sinus rinse as attached -return to clinic if no improvement

## 2017-04-02 NOTE — ED Triage Notes (Signed)
Per patient was seen x 2 week ago . Patient stated not feeling any better.

## 2017-04-29 DIAGNOSIS — I151 Hypertension secondary to other renal disorders: Secondary | ICD-10-CM | POA: Diagnosis not present

## 2017-04-29 DIAGNOSIS — E032 Hypothyroidism due to medicaments and other exogenous substances: Secondary | ICD-10-CM | POA: Diagnosis not present

## 2017-04-29 DIAGNOSIS — E1165 Type 2 diabetes mellitus with hyperglycemia: Secondary | ICD-10-CM | POA: Diagnosis not present

## 2017-04-29 DIAGNOSIS — E559 Vitamin D deficiency, unspecified: Secondary | ICD-10-CM | POA: Diagnosis not present

## 2017-04-29 DIAGNOSIS — E785 Hyperlipidemia, unspecified: Secondary | ICD-10-CM | POA: Diagnosis not present

## 2017-05-09 DIAGNOSIS — E1165 Type 2 diabetes mellitus with hyperglycemia: Secondary | ICD-10-CM | POA: Diagnosis not present

## 2017-05-09 DIAGNOSIS — E039 Hypothyroidism, unspecified: Secondary | ICD-10-CM | POA: Diagnosis not present

## 2017-05-11 ENCOUNTER — Other Ambulatory Visit: Payer: Self-pay | Admitting: Family Medicine

## 2017-05-11 NOTE — Telephone Encounter (Signed)
Your patient 

## 2017-05-26 ENCOUNTER — Other Ambulatory Visit: Payer: Self-pay | Admitting: Unknown Physician Specialty

## 2017-05-26 MED ORDER — PREGABALIN 100 MG PO CAPS: 100.0000 mg | ORAL_CAPSULE | Freq: Every day | ORAL | 12 refills | Status: DC

## 2017-05-26 NOTE — Telephone Encounter (Signed)
Patient last seen 02/01/17

## 2017-05-26 NOTE — Telephone Encounter (Signed)
Copied from [place]. Topic: Quick Communication - See Telephone Encounter >> May 26, 2017 10:11 AM Cleaster Corin, NT wrote: CRM for notification. See Telephone encounter for:   05/26/17. Pt. Calling to get refill on med. [place], [place] [address] [place] [address] Phone: [phone] Fax: [phone]

## 2017-05-27 ENCOUNTER — Other Ambulatory Visit: Payer: Self-pay | Admitting: Unknown Physician Specialty

## 2017-06-08 ENCOUNTER — Ambulatory Visit (INDEPENDENT_AMBULATORY_CARE_PROVIDER_SITE_OTHER): Payer: Medicare Other | Admitting: Family Medicine

## 2017-06-08 VITALS — BP 155/85 | HR 78 | Temp 98.3°F | Resp 15 | Wt 187.8 lb

## 2017-06-08 DIAGNOSIS — J0141 Acute recurrent pansinusitis: Secondary | ICD-10-CM

## 2017-06-08 MED ORDER — AZITHROMYCIN 250 MG PO TABS: ORAL_TABLET | ORAL | 0 refills | Status: DC

## 2017-06-08 MED ORDER — GUAIFENESIN ER 600 MG PO TB12: 600.0000 mg | ORAL_TABLET | Freq: Two times a day (BID) | ORAL | 0 refills | Status: DC

## 2017-06-08 NOTE — Progress Notes (Signed)
BP (!) 155/85 (BP Location: Left Arm, Patient Position: Sitting)   Pulse 78   Temp 98.3 F (36.8 C) (Oral)   Resp 15   Wt 187 lb 12.8 oz (85.2 kg)   LMP  (LMP Unknown)   SpO2 98%   BMI 31.25 kg/m    Subjective:    Patient ID: Mary Parks, female    DOB: [birth year], 67 y.o.   MRN: [id number]  HPI: [name] is a 67 y.o. female  Chief Complaint  Patient presents with  . Sinus Problem    sinus pressure    Facial pain and pressure, fatigue, congestion, tooth pain, neck soreness, slight cough x 5 days. Compliant with singulair, zyrtec, and flonase regimen. Taking OTC cold and sinus meds with no relief. No sick contacts. This is her third flare of this in the past few months, states it never fully improves. Has recurrent sinus infections, has seen allergist and ENT in the past and states theres nothing more they can do for her.   Past Medical History:  Diagnosis Date  . Asthma   . Chest pain   . DM2 (diabetes mellitus, type 2) ([place]) 1987  . Dyspnea    Cath normal coronaries. normal EF. RA 11. PA 37/17 (26) LVEDP 36  . GERD (gastroesophageal reflux disease)   . HTN (hypertension)   . Hyperlipidemia   . Obesity   . Osteoarthritis    knees  . Renal disease   . Thyroid disease    having thyroid removed 04/14/15  . Vertigo    none recently   Social History   Socioeconomic History  . Marital status: Widowed    Spouse name: Not on file  . Number of children: Not on file  . Years of education: Not on file  . Highest education level: Not on file  Social Needs  . Financial resource strain: Not on file  . Food insecurity - worry: Not on file  . Food insecurity - inability: Not on file  . Transportation needs - medical: Not on file  . Transportation needs - non-medical: Not on file  Occupational History  . 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 Smoking status: Never Smoker  . Smokeless tobacco: Never Used    Substance and Sexual Activity  . Alcohol use: No  . Drug use: No  . Sexual activity: No  Other Topics Concern  . Not on file  Social History Narrative  . Not on file   Relevant past medical, surgical, family and social history reviewed and updated as indicated. Interim medical history since our last visit reviewed. Allergies and medications reviewed and updated.  Review of Systems  Constitutional: Positive for fatigue.  HENT: Positive for congestion, dental problem, sinus pressure and sinus pain.   Respiratory: Positive for cough.   Cardiovascular: Negative.   Gastrointestinal: Negative.   Genitourinary: Negative.   Musculoskeletal: Positive for myalgias.  Neurological: Negative.   Psychiatric/Behavioral: Negative.    Per HPI unless specifically indicated above     Objective:    BP (!) 155/85 (BP Location: Left Arm, Patient Position: Sitting)   Pulse 78   Temp 98.3 F (36.8 C) (Oral)   Resp 15   Wt 187 lb 12.8 oz (85.2 kg)   LMP  (LMP Unknown)   SpO2 98%   BMI 31.25 kg/m   Wt Readings from Last 3 Encounters:  06/08/17 187 lb 12.8 oz (85.2  kg)  04/02/17 194 lb (88 kg)  03/15/17 194 lb (88 kg)    Physical Exam  Constitutional: She is oriented to person, place, and time. She appears well-developed and well-nourished. No distress.  HENT:  Head: Atraumatic.  Right Ear: External ear normal.  Left Ear: External ear normal.  B/l sinus ttp Oropharynx and nasal mucosa significantly erythematous and edematous, with thick drainage present  Eyes: Conjunctivae are normal. Pupils are equal, round, and reactive to light. No scleral icterus.  Neck: Normal range of motion. Neck supple.  Pulmonary/Chest: Effort normal and breath sounds normal. No respiratory distress. She has no wheezes. She has no rales.  Musculoskeletal: Normal range of motion.  Lymphadenopathy:    She has cervical adenopathy (with ttp).  Neurological: She is alert and oriented to person, place, and time.   Skin: Skin is warm and dry.  Psychiatric: She has a normal mood and affect. Her behavior is normal.  Nursing note and vitals reviewed.     Assessment & Plan:   Problem List Items Addressed This Visit    None    Visit Diagnoses    Acute recurrent pansinusitis    -  Primary   Will treat with zpack, continued allergy regimen, sinus rinses, humidifier. F/u if worsening or no improvement   Relevant Medications   azithromycin (ZITHROMAX) 250 MG tablet   guaiFENesin (MUCINEX) 600 MG 12 hr tablet       Follow up plan: Return for as scheduled.

## 2017-06-09 ENCOUNTER — Other Ambulatory Visit: Payer: Self-pay | Admitting: Unknown Physician Specialty

## 2017-06-09 NOTE — Telephone Encounter (Signed)
Looks like she is on Metoprolol.   Don't see Lisinopril mentioned in the notes.  Mary Parks's pt.

## 2017-06-11 NOTE — Patient Instructions (Signed)
Follow up as scheduled.  

## 2017-06-29 ENCOUNTER — Encounter: Payer: Self-pay | Admitting: Unknown Physician Specialty

## 2017-06-29 ENCOUNTER — Ambulatory Visit (INDEPENDENT_AMBULATORY_CARE_PROVIDER_SITE_OTHER): Payer: Medicare Other | Admitting: Unknown Physician Specialty

## 2017-06-29 VITALS — BP 129/85 | HR 79 | Temp 98.0°F | Wt 185.8 lb

## 2017-06-29 DIAGNOSIS — E1122 Type 2 diabetes mellitus with diabetic chronic kidney disease: Secondary | ICD-10-CM

## 2017-06-29 DIAGNOSIS — J0101 Acute recurrent maxillary sinusitis: Secondary | ICD-10-CM

## 2017-06-29 DIAGNOSIS — I129 Hypertensive chronic kidney disease with stage 1 through stage 4 chronic kidney disease, or unspecified chronic kidney disease: Secondary | ICD-10-CM

## 2017-06-29 DIAGNOSIS — E890 Postprocedural hypothyroidism: Secondary | ICD-10-CM

## 2017-06-29 DIAGNOSIS — E7800 Pure hypercholesterolemia, unspecified: Secondary | ICD-10-CM | POA: Diagnosis not present

## 2017-06-29 DIAGNOSIS — E1169 Type 2 diabetes mellitus with other specified complication: Secondary | ICD-10-CM | POA: Insufficient documentation

## 2017-06-29 DIAGNOSIS — E785 Hyperlipidemia, unspecified: Secondary | ICD-10-CM

## 2017-06-29 MED ORDER — CEFDINIR 300 MG PO CAPS: 300.0000 mg | ORAL_CAPSULE | Freq: Two times a day (BID) | ORAL | 0 refills | Status: DC

## 2017-06-29 MED ORDER — FLUTICASONE PROPIONATE 50 MCG/ACT NA SUSP: 2.0000 | Freq: Every day | NASAL | 6 refills | Status: DC

## 2017-06-29 NOTE — Progress Notes (Signed)
BP 129/85 (BP Location: Left Arm, Cuff Size: Normal)   Pulse 79   Temp 98 F (36.7 C) (Oral)   Wt 185 lb 12.8 oz (84.3 kg)   LMP  (LMP Unknown)   SpO2 97%   BMI 30.92 kg/m    Subjective:    Patient ID: Mary Parks, female    DOB: 03-16-1951, 67 y.o.   MRN: [id number]  HPI: Mary Parks is a 67 y.o. female  Chief Complaint  Patient presents with  . Diabetes    pt states she has eye exam scheduled for next month   . Hyperlipidemia  . Hypertension  . URI    pt seen 06/08/17 and given z-pack and mucinex. Pt states she started to feel better with these but has started to have a couhg and congestion again   Diabetes: Seeing Endocrinology who are following with A1C's.  Taking 34 u Lantus BID.  Taking Humalog 5 u TID.  Her Endocrinologist does not communicate through EMR Last Hgb A1C: 9.4  Hypertension  Using medications without difficulty Average home BPs: SBP 112-120   Using medication without problems or lightheadedness No chest pain with exertion or shortness of breath No Edema  Elevated Cholesterol Using medications without problems No Muscle aches  Diet: Exercise:Diet is pretty good but not exercising.    Sinusitis Note from 1/16 reviewed.  Treated with z pack.  Initial improvement but now back with coughing and congestion starting last week.    Relevant past medical, surgical, family and social history reviewed and updated as indicated. Interim medical history since our last visit reviewed. Allergies and medications reviewed and updated.  Review of Systems  Per HPI unless specifically indicated above     Objective:    BP 129/85 (BP Location: Left Arm, Cuff Size: Normal)   Pulse 79   Temp 98 F (36.7 C) (Oral)   Wt 185 lb 12.8 oz (84.3 kg)   LMP  (LMP Unknown)   SpO2 97%   BMI 30.92 kg/m   Wt Readings from Last 3 Encounters:  06/29/17 185 lb 12.8 oz (84.3 kg)  06/08/17 187 lb 12.8 oz (85.2 kg)  04/02/17 194 lb (88 kg)    Physical Exam    Constitutional: She is oriented to person, place, and time. She appears well-developed and well-nourished. No distress.  HENT:  Head: Normocephalic and atraumatic.  Right Ear: Tympanic membrane and ear canal normal.  Left Ear: Tympanic membrane and ear canal normal.  Nose: No rhinorrhea. Right sinus exhibits maxillary sinus tenderness. Right sinus exhibits no frontal sinus tenderness. Left sinus exhibits maxillary sinus tenderness. Left sinus exhibits no frontal sinus tenderness.  Eyes: Conjunctivae and lids are normal. Right eye exhibits no discharge. Left eye exhibits no discharge. No scleral icterus.  Cardiovascular: Normal rate and regular rhythm.  Pulmonary/Chest: Effort normal and breath sounds normal. No respiratory distress.  Abdominal: Normal appearance. There is no splenomegaly or hepatomegaly.  Musculoskeletal: Normal range of motion.  Neurological: She is alert and oriented to person, place, and time.  Skin: Skin is intact. No rash noted. No pallor.  Psychiatric: She has a normal mood and affect. Her behavior is normal. Judgment and thought content normal.    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  Check CMP today      Relevant Orders   Comprehensive metabolic panel   CBC with Differential/Platelet   S/P total thyroidectomy    On supplementation.  Check TSH      Relevant Orders   TSH    Other Visit Diagnoses    Acute recurrent  maxillary sinusitis    -  Primary   Saline nasal sprays.  Add flonase.  Rx for Omnicef 300 mg BID.    Relevant Medications   cefdinir (OMNICEF) 300 MG capsule   fluticasone (FLONASE) 50 MCG/ACT nasal spray       Follow up plan: Return in about 6 months (around 12/27/2017).

## 2017-06-29 NOTE — Assessment & Plan Note (Signed)
Check Lipid panel today 

## 2017-06-29 NOTE — Assessment & Plan Note (Signed)
Stable, continue present medications.  Check CMP today

## 2017-06-29 NOTE — Assessment & Plan Note (Signed)
On supplementation.  Check TSH

## 2017-06-30 ENCOUNTER — Encounter: Payer: Self-pay | Admitting: Unknown Physician Specialty

## 2017-06-30 LAB — COMPREHENSIVE METABOLIC PANEL
A/G RATIO: 1.4 (ref 1.2–2.2)
ALT: 27 IU/L (ref 0–32)
AST: 38 IU/L (ref 0–40)
Albumin: 4.1 g/dL (ref 3.6–4.8)
Alkaline Phosphatase: 107 IU/L (ref 39–117)
BUN / CREAT RATIO: 11 — AB (ref 12–28)
BUN: 12 mg/dL (ref 8–27)
Bilirubin Total: 0.3 mg/dL (ref 0.0–1.2)
CALCIUM: 9.6 mg/dL (ref 8.7–10.3)
CHLORIDE: 104 mmol/L (ref 96–106)
CO2: 24 mmol/L (ref 20–29)
Creatinine, Ser: 1.13 mg/dL — ABNORMAL HIGH (ref 0.57–1.00)
GFR, EST AFRICAN AMERICAN: 57 mL/min/{1.73_m2} — AB (ref >59)
GFR, EST NON AFRICAN AMERICAN: 50 mL/min/{1.73_m2} — AB (ref >59)
GLOBULIN, TOTAL: 2.9 g/dL (ref 1.5–4.5)
Glucose: 297 mg/dL — ABNORMAL HIGH (ref 65–99)
POTASSIUM: 4.2 mmol/L (ref 3.5–5.2)
SODIUM: 145 mmol/L — AB (ref 134–144)
TOTAL PROTEIN: 7 g/dL (ref 6.0–8.5)

## 2017-06-30 LAB — CBC WITH DIFFERENTIAL/PLATELET
BASOS ABS: 0 10*3/uL (ref 0.0–0.2)
Basos: 1 %
EOS (ABSOLUTE): 0.1 10*3/uL (ref 0.0–0.4)
EOS: 4 %
HEMATOCRIT: 40.1 % (ref 34.0–46.6)
HEMOGLOBIN: 13.3 g/dL (ref 11.1–15.9)
IMMATURE GRANS (ABS): 0 10*3/uL (ref 0.0–0.1)
IMMATURE GRANULOCYTES: 0 %
LYMPHS ABS: 1 10*3/uL (ref 0.7–3.1)
LYMPHS: 31 %
MCH: 27.8 pg (ref 26.6–33.0)
MCHC: 33.2 g/dL (ref 31.5–35.7)
MCV: 84 fL (ref 79–97)
MONOCYTES: 6 %
Monocytes Absolute: 0.2 10*3/uL (ref 0.1–0.9)
NEUTROS PCT: 58 %
Neutrophils Absolute: 2 10*3/uL (ref 1.4–7.0)
Platelets: 201 10*3/uL (ref 150–379)
RBC: 4.79 x10E6/uL (ref 3.77–5.28)
RDW: 14.9 % (ref 12.3–15.4)
WBC: 3.4 10*3/uL (ref 3.4–10.8)

## 2017-06-30 LAB — LIPID PANEL W/O CHOL/HDL RATIO
Cholesterol, Total: 149 mg/dL (ref 100–199)
HDL: 39 mg/dL — ABNORMAL LOW (ref >39)
LDL Calculated: 76 mg/dL (ref 0–99)
Triglycerides: 170 mg/dL — ABNORMAL HIGH (ref 0–149)
VLDL Cholesterol Cal: 34 mg/dL (ref 5–40)

## 2017-06-30 LAB — TSH: TSH: 1.22 u[IU]/mL (ref 0.450–4.500)

## 2017-07-12 DIAGNOSIS — Z13820 Encounter for screening for osteoporosis: Secondary | ICD-10-CM | POA: Diagnosis not present

## 2017-07-12 DIAGNOSIS — M8589 Other specified disorders of bone density and structure, multiple sites: Secondary | ICD-10-CM | POA: Diagnosis not present

## 2017-07-19 DIAGNOSIS — E119 Type 2 diabetes mellitus without complications: Secondary | ICD-10-CM | POA: Diagnosis not present

## 2017-07-19 DIAGNOSIS — E039 Hypothyroidism, unspecified: Secondary | ICD-10-CM | POA: Diagnosis not present

## 2017-07-19 DIAGNOSIS — M899 Disorder of bone, unspecified: Secondary | ICD-10-CM | POA: Diagnosis not present

## 2017-07-21 ENCOUNTER — Other Ambulatory Visit: Payer: Self-pay | Admitting: Unknown Physician Specialty

## 2017-07-29 DIAGNOSIS — E785 Hyperlipidemia, unspecified: Secondary | ICD-10-CM | POA: Diagnosis not present

## 2017-07-29 DIAGNOSIS — E559 Vitamin D deficiency, unspecified: Secondary | ICD-10-CM | POA: Diagnosis not present

## 2017-07-29 DIAGNOSIS — I10 Essential (primary) hypertension: Secondary | ICD-10-CM | POA: Diagnosis not present

## 2017-07-29 DIAGNOSIS — E039 Hypothyroidism, unspecified: Secondary | ICD-10-CM | POA: Diagnosis not present

## 2017-07-29 DIAGNOSIS — E1165 Type 2 diabetes mellitus with hyperglycemia: Secondary | ICD-10-CM | POA: Diagnosis not present

## 2017-08-02 ENCOUNTER — Other Ambulatory Visit: Payer: Self-pay

## 2017-08-02 ENCOUNTER — Ambulatory Visit
Admission: EM | Admit: 2017-08-02 | Discharge: 2017-08-02 | Disposition: A | Discharge status: Home / Self Care | Payer: Medicare Other | Attending: Family Medicine | Admitting: Family Medicine

## 2017-08-02 DIAGNOSIS — M5430 Sciatica, unspecified side: Secondary | ICD-10-CM | POA: Diagnosis not present

## 2017-08-02 MED ORDER — METHYLPREDNISOLONE 4 MG PO TBPK: ORAL_TABLET | ORAL | 0 refills | Status: DC

## 2017-08-02 MED ORDER — DIAZEPAM 2 MG PO TABS: 2.0000 mg | ORAL_TABLET | Freq: Three times a day (TID) | ORAL | 0 refills | Status: DC

## 2017-08-02 MED ORDER — KETOROLAC TROMETHAMINE 60 MG/2ML IM SOLN
30.0000 mg | Freq: Once | INTRAMUSCULAR | Status: AC
  Administered 2017-08-02: 30 mg via INTRAMUSCULAR

## 2017-08-02 NOTE — ED Provider Notes (Signed)
[facility]    CSN: [id number] Arrival date & time: 08/02/17  1050     History   Chief Complaint Chief Complaint  Patient presents with  . Back Pain    HPI Mary Parks is a 67 y.o. female.   HPI  67 year old female presents with back pain radiating into her butt cheeks and down both legs to the level of her ankles.  He had the symptoms for a week.  She was helping her niece pack cabinets by sitting on a very low stool and bending forward for long period of time.  2 days later she started having her symptoms.  Her pain is  a 7 out of 10.  Never injured her back in the past.  Denies any incontinence.      Past Medical History:  Diagnosis Date  . Asthma   . Chest pain   . DM2 (diabetes mellitus, type 2) ([place]) 1987  . Dyspnea    Cath normal coronaries. normal EF. RA 11. PA 37/17 (26) LVEDP 36  . GERD (gastroesophageal reflux disease)   . HTN (hypertension)   . Hyperlipidemia   . Obesity   . Osteoarthritis    knees  . Renal disease   . Thyroid disease    having thyroid removed 04/14/15  . Vertigo    none recently    Patient Active Problem List   Diagnosis Date Noted  . Hypercholesteremia 06/29/2017  . Special screening for malignant neoplasms, colon   . Benign neoplasm of cecum   . Benign neoplasm of ascending colon   . Benign neoplasm of descending colon   . Allergic rhinitis 09/22/2016  . OA (osteoarthritis) of knee 06/25/2016  . Hypertension 10/03/2015  . Peripheral neuropathy 10/03/2015  . Uncontrolled type 2 diabetes mellitus with chronic kidney disease ([place]) 10/03/2015  . Type 2 diabetes mellitus with diabetic neuropathy, with long-term current use of insulin ([place]) 10/03/2015  . S/P total thyroidectomy 04/14/2015  . Vertigo, intermittent 04/04/2015  . Chronic kidney disease, stage 3 ([place]) 12/17/2014  . 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 Diabetes mellitus with chronic  kidney disease ([place]) 11/28/2014  . COPD (chronic obstructive pulmonary disease) ([place]) 11/19/2014  . Allergic conjunctivitis 11/19/2014  . UNSPECIFIED ANEMIA 09/25/2008  . DYSPNEA 09/13/2008  . SNORING 09/13/2008    Past Surgical History:  Procedure Laterality Date  . CARDIAC CATHETERIZATION    . CATARACT EXTRACTION W/PHACO Right 03/31/2015   Procedure: CATARACT EXTRACTION PHACO AND INTRAOCULAR LENS PLACEMENT (IOC);  Surgeon: Ronnell Freshwater, MD;  Location: [facility];  Service: Ophthalmology;  Laterality: Right;  DIABETIC - insulin  . COLONOSCOPY WITH PROPOFOL N/A 11/12/2016   Procedure: COLONOSCOPY WITH PROPOFOL;  Surgeon: [name], MD;  Location: [facility];  Service: Endoscopy;  Laterality: N/A;  Diabetic - insulin  . EYE SURGERY    . HERNIA REPAIR     umbilical  . POLYPECTOMY  11/12/2016   Procedure: POLYPECTOMY INTESTINAL;  Surgeon: [name], MD;  Location: [facility];  Service: Endoscopy;;  . THYROIDECTOMY N/A 04/14/2015   Procedure: THYROIDECTOMY;  Surgeon: Margaretha Sheffield, MD;  Location: [facility];  Service: ENT;  Laterality: N/A;    OB History    No data available       Home Medications    Prior to Admission medications   Medication Sig Start Date End Date Taking? Authorizing Provider  albuterol (PROVENTIL HFA;VENTOLIN HFA) 108 (90 Base) MCG/ACT  inhaler Inhale 2 puffs into the lungs every 6 (six) hours as needed for wheezing or shortness of breath. 12/24/16   Kathrine Haddock, NP  aspirin EC 81 MG tablet Take 81 mg by mouth daily.    [provider]  atorvastatin (LIPITOR) 40 MG tablet TAKE 1 TABLET BY MOUTH AT BEDTIME 05/11/17   Kathrine Haddock, NP  budesonide-formoterol [facility]) 160-4.5 MCG/ACT inhaler Inhale 2 puffs into the lungs 2 (two) times daily. 12/24/16   Kathrine Haddock, NP  calcium-vitamin D (OSCAL WITH D) 500-200 MG-UNIT tablet Take 1 tablet by mouth.    [provider]  cetirizine (ZYRTEC) 10 MG tablet  Take 10 mg by mouth daily.    [provider]  diazepam (VALIUM) 2 MG tablet Take 1 tablet (2 mg total) by mouth 3 (three) times daily. For Muscle spasm 08/02/17   Crecencio Mc P, PA-C  fluticasone [facility]) 50 MCG/ACT nasal spray Place 2 sprays into both nostrils daily. 06/29/17   Kathrine Haddock, NP  HUMALOG KWIKPEN 100 UNIT/ML KiwkPen INJECT 20 UNITS SUBCUTANEOUSLY BEFORE BREAKFAST AND 20 UNITS BEFORE LUNCH AND 30 UNITS BEFORE EVENING MEAL 07/21/17   Kathrine Haddock, NP  insulin glargine (LANTUS) 100 UNIT/ML injection Inject 34 Units into the skin at bedtime.     [provider]  levothyroxine (SYNTHROID, LEVOTHROID) 100 MCG tablet Take 100 mcg by mouth daily. 07/02/15   [provider]  lisinopril (PRINIVIL,ZESTRIL) 20 MG tablet TAKE 1 TABLET BY MOUTH ONCE DAILY 06/09/17   Kathrine Haddock, NP  LYRICA 100 MG capsule TAKE 1 CAPSULE BY MOUTH ONCE DAILY 05/27/17   Kathrine Haddock, NP  methylPREDNISolone (MEDROL DOSEPAK) 4 MG TBPK tablet Take per package instructions 08/02/17   Lorin Picket, PA-C  metoprolol tartrate (LOPRESSOR) 25 MG tablet Take 25 mg by mouth 2 (two) times daily. 07/09/16   [provider]  montelukast (SINGULAIR) 10 MG tablet TAKE ONE TABLET BY MOUTH ONCE DAILY 10/08/16   Wynetta Emery, Megan P, DO  omeprazole (PRILOSEC) 20 MG capsule Take 1 capsule (20 mg total) by mouth daily. 09/11/15   Kathrine Haddock, NP  ONE TOUCH ULTRA TEST test strip 1 each by Other route 2 (two) times daily.  03/15/15   [provider]  Jonetta Speak LANCETS 89V MISC 2 (two) times daily.  03/15/15   [provider]  TRULICITY 1.5 QX/4.5WT SOPN INJECT 1.5MG  (1 SYRINGE) INTO THE SKIN ONCE A WEEK 11/16/16   Kathrine Haddock, NP    Family History Family History  Problem Relation Age of Onset  . Diabetes Mother   . Diabetes Father   . Diabetes Sister   . Diabetes Brother   . Diabetes Sister   . Diabetes Sister   . Diabetes Maternal Grandmother   . Diabetes  Maternal Grandfather   . Diabetes Paternal Grandmother   . Diabetes Paternal Grandfather   . Heart disease Neg Hx   . Coronary artery disease Neg Hx   . Breast cancer Neg Hx     Social History Social History   Tobacco Use  . Smoking status: Never Smoker  . Smokeless tobacco: Never Used  Substance Use Topics  . Alcohol use: No  . Drug use: No     Allergies   Celebrex [celecoxib]; Sulfa antibiotics; Sulfisoxazole; and Sulfur   Review of Systems Review of Systems  Constitutional: Positive for activity change. Negative for chills, fatigue and fever.  Musculoskeletal: Positive for back pain.  All other systems reviewed and are negative.    Physical  Exam Triage Vital Signs ED Triage Vitals [08/02/17 1108]  Enc Vitals Group     BP (!) 153/85     Pulse Rate 79     Resp 18     Temp 98.2 F (36.8 C)     Temp src      SpO2 99 %     Weight 180 lb (81.6 kg)     Height 5' 4.5" (1.638 m)     Head Circumference      Peak Flow      Pain Score 7     Pain Loc      Pain Edu?      Excl. in [place]?    No data found.  Updated Vital Signs BP (!) 153/85 (BP Location: Left Arm)   Pulse 79   Temp 98.2 F (36.8 C)   Resp 18   Ht 5' 4.5" (1.638 m)   Wt 180 lb (81.6 kg)   LMP  (LMP Unknown)   SpO2 99%   BMI 30.42 kg/m   Visual Acuity Right Eye Distance:   Left Eye Distance:   Bilateral Distance:    Right Eye Near:   Left Eye Near:    Bilateral Near:     Physical Exam  Constitutional: She is oriented to person, place, and time. She appears well-developed and well-nourished. No distress.  HENT:  Head: Normocephalic.  Eyes: Pupils are equal, round, and reactive to light. Right eye exhibits no discharge. Left eye exhibits no discharge.  Neck: Normal range of motion.  Musculoskeletal: Normal range of motion. She exhibits tenderness.  Examination of the lumbar spine shows the patient able to forward flex to the level of her knees with her hands.  Being upright posture is  difficult as is leftward flexion more than right.  Is able to toe and heel walk adequately.  She has tenderness to palpation in the sciatic notch bilaterally more prevalent on the right.  Leg raise testing in the seated position is positive at 90 degrees with low back and leg pain more on the left than the right.  Sensation is intact to light touch.  DTRs are 2+/4 and symmetrical.  Neurological: She is alert and oriented to person, place, and time.  Skin: Skin is warm and dry. She is not diaphoretic.  Psychiatric: She has a normal mood and affect. Her behavior is normal. Judgment and thought content normal.  Nursing note and vitals reviewed.    UC Treatments / Results  Labs (all labs ordered are listed, but only abnormal results are displayed) Labs Reviewed - No data to display  EKG  EKG Interpretation None       Radiology No results found.  Procedures Procedures (including critical care time)  Medications Ordered in UC Medications  ketorolac (TORADOL) injection 30 mg (30 mg Intramuscular Given 08/02/17 1212)     Initial Impression / Assessment and Plan / UC Course  I have reviewed the triage vital signs and the nursing notes.  Pertinent labs & imaging results that were available during my care of the patient were reviewed by me and considered in my medical decision making (see chart for details).     Plan: 1. Test/x-ray results and diagnosis reviewed with patient 2. rx as per orders; risks, benefits, potential side effects reviewed with patient 3. Recommend supportive treatment with symptom avoidance and rest.  She does have an intolerance to muscle relaxers but I will prescribe Valium to help relieve the spasm.  I have cautioned  her regarding activities requiring concentration and judgment not to drive while taking the medication.  She also has chronic kidney disease and is not able to take nonsteroidal anti-inflammatory drugs on a chronic basis.  Give her Medrol Dosepak to  help with the inflammatory response.  If she continues to have pain she should follow-up with her primary care physician 4. F/u prn if symptoms worsen or don't improve   Final Clinical Impressions(s) / UC Diagnoses   Final diagnoses:  Sciatica, unspecified laterality    ED Discharge Orders        Ordered    diazepam (VALIUM) 2 MG tablet  3 times daily,   Status:  Discontinued     08/02/17 1202    methylPREDNISolone (MEDROL DOSEPAK) 4 MG TBPK tablet     08/02/17 1202    diazepam (VALIUM) 2 MG tablet  3 times daily     08/02/17 1206       Controlled Substance Prescriptions [place] Controlled Substance Registry consulted? Not Applicable   Lorin Picket, PA-C 08/02/17 1216

## 2017-08-02 NOTE — ED Triage Notes (Signed)
Pt with low back pain into butt cheeks and down both legs. Sx x one week. Had been helping her niece pack up cabinets for a move and said she spent a lot of time on a footstool last Saturday. Pain 7/10

## 2017-08-09 ENCOUNTER — Other Ambulatory Visit: Payer: Self-pay | Admitting: Unknown Physician Specialty

## 2017-08-10 NOTE — Telephone Encounter (Signed)
Spoke   With  [address]   rx  For lipitor   40  Mg  Received   And  Will be  Filled

## 2017-08-15 ENCOUNTER — Other Ambulatory Visit: Payer: Self-pay | Admitting: Unknown Physician Specialty

## 2017-08-15 DIAGNOSIS — Z1231 Encounter for screening mammogram for malignant neoplasm of breast: Secondary | ICD-10-CM

## 2017-08-17 ENCOUNTER — Ambulatory Visit
Admission: RE | Admit: 2017-08-17 | Discharge: 2017-08-17 | Disposition: A | Discharge status: Home / Self Care | Payer: Medicare Other | Source: Ambulatory Visit | Attending: Unknown Physician Specialty | Admitting: Unknown Physician Specialty

## 2017-08-17 DIAGNOSIS — Z1231 Encounter for screening mammogram for malignant neoplasm of breast: Secondary | ICD-10-CM | POA: Diagnosis not present

## 2017-09-03 IMAGING — MG MM DIGITAL SCREENING BILAT W/ CAD
4 series · 4 of 4 positions shown · non-contrast
Comparison: Previous exam(s).

CLINICAL DATA: Screening.

EXAM:
DIGITAL SCREENING BILATERAL MAMMOGRAM WITH CAD

[R MLO]
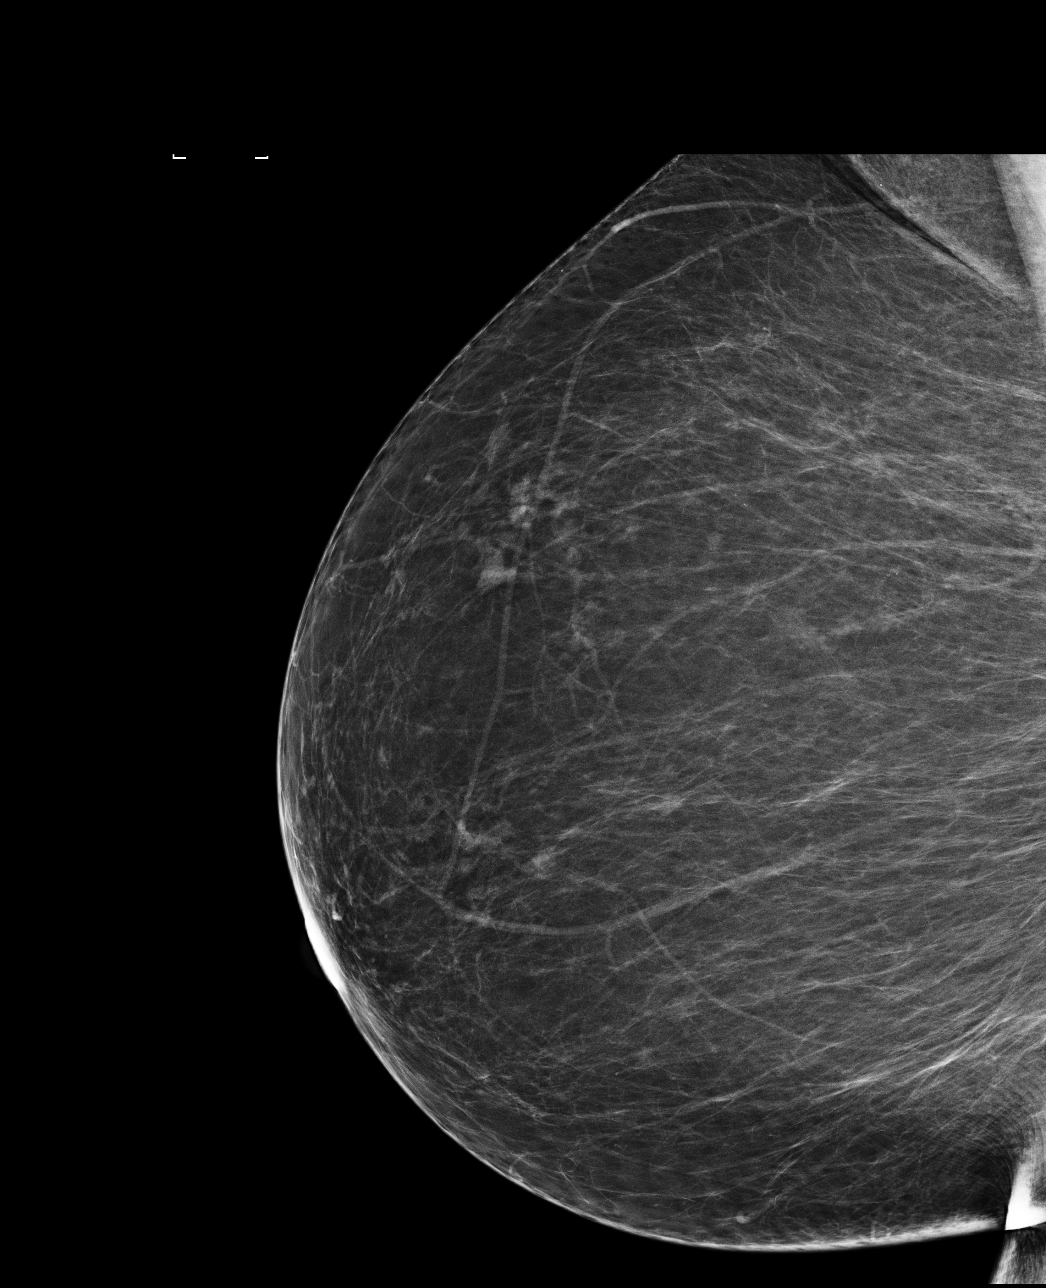

[R CC]
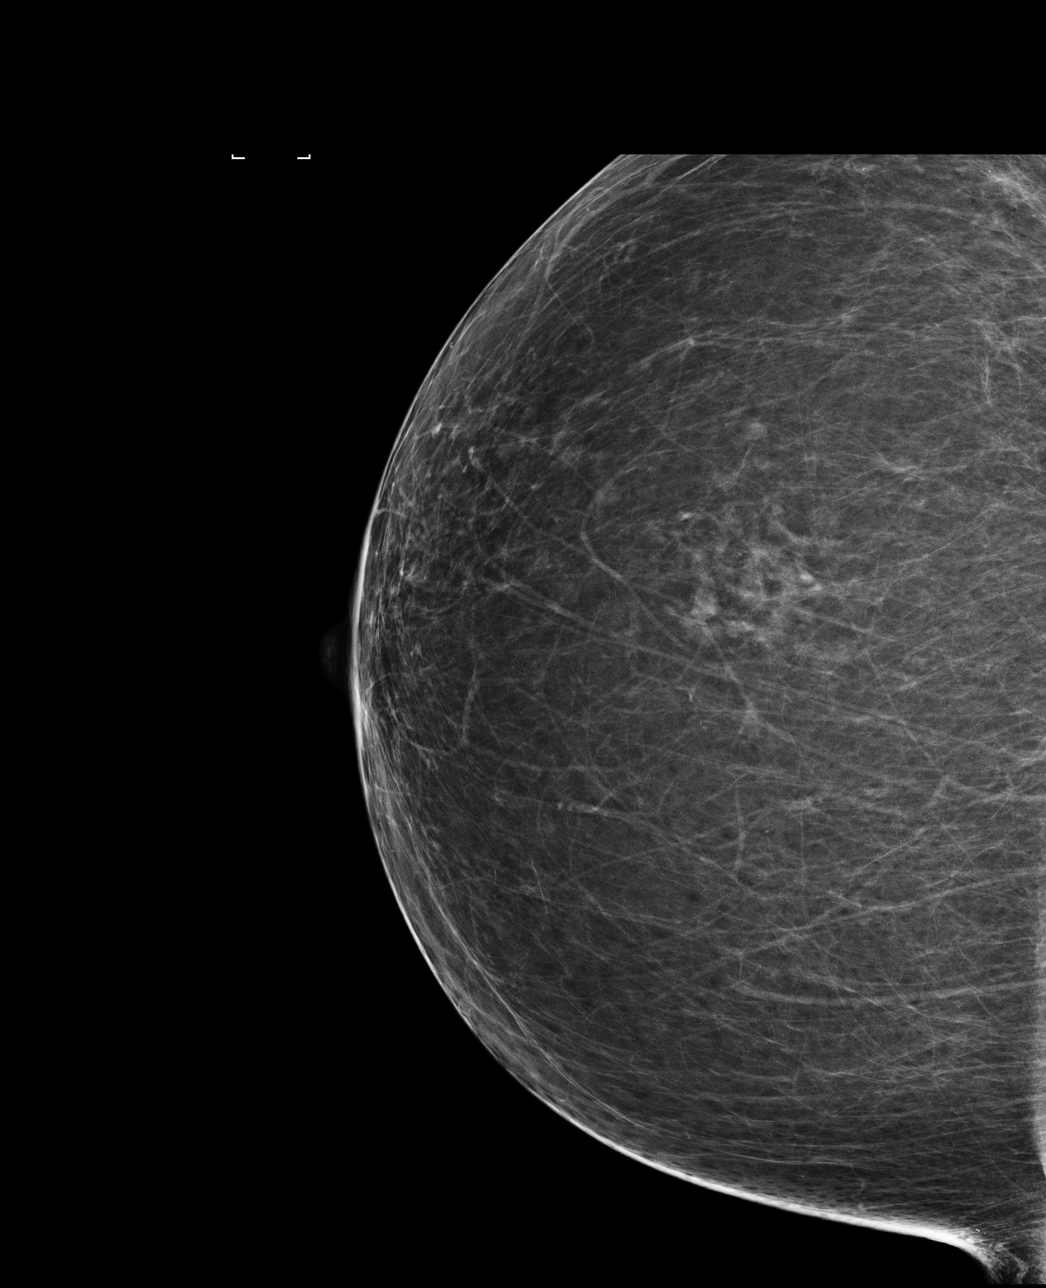

[L MLO]
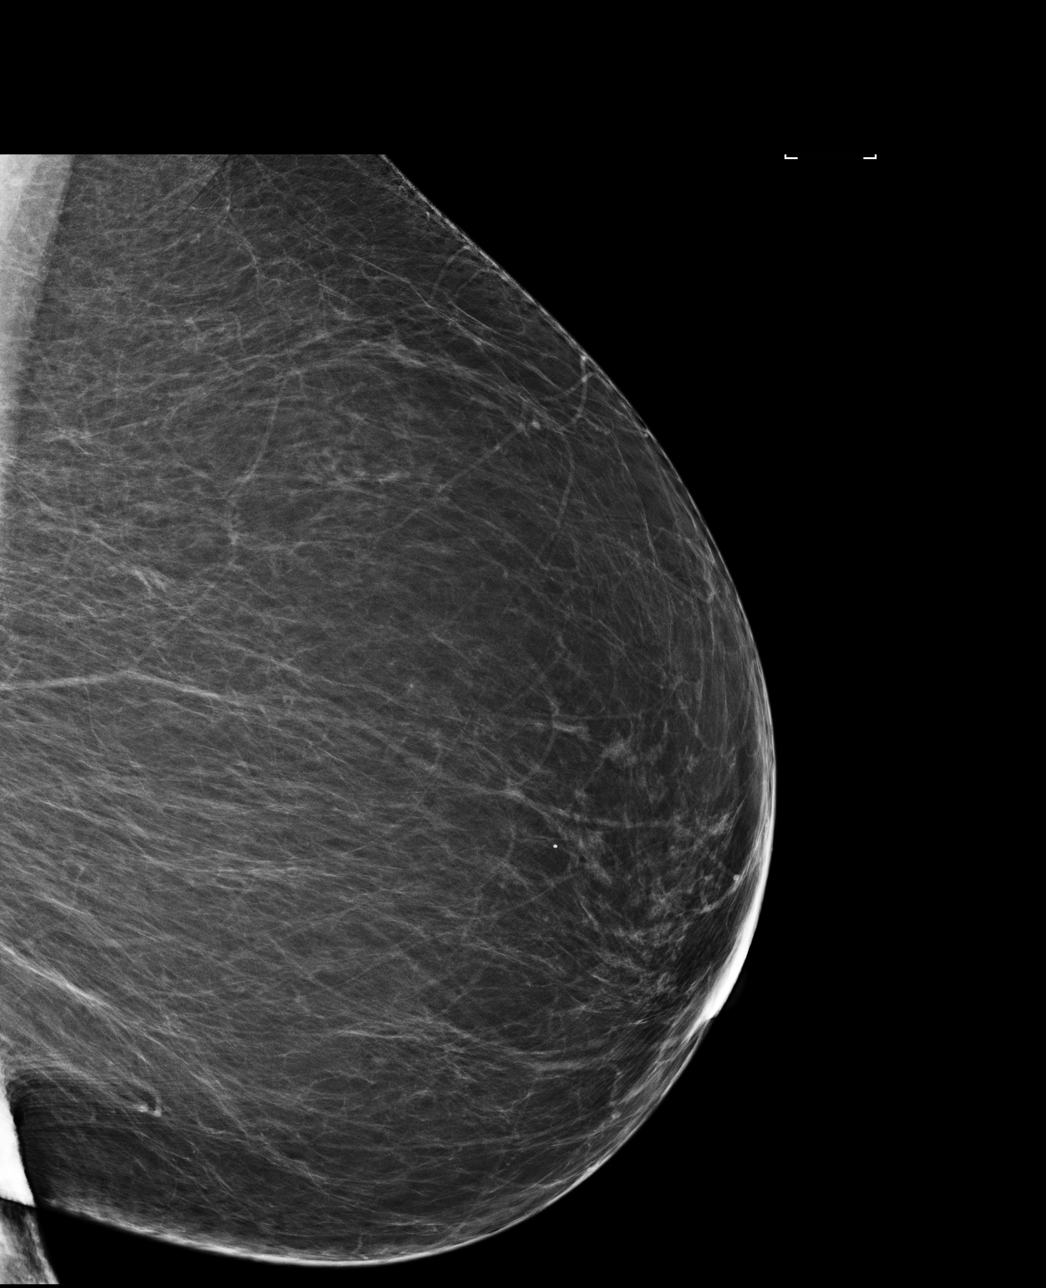

[L CC]
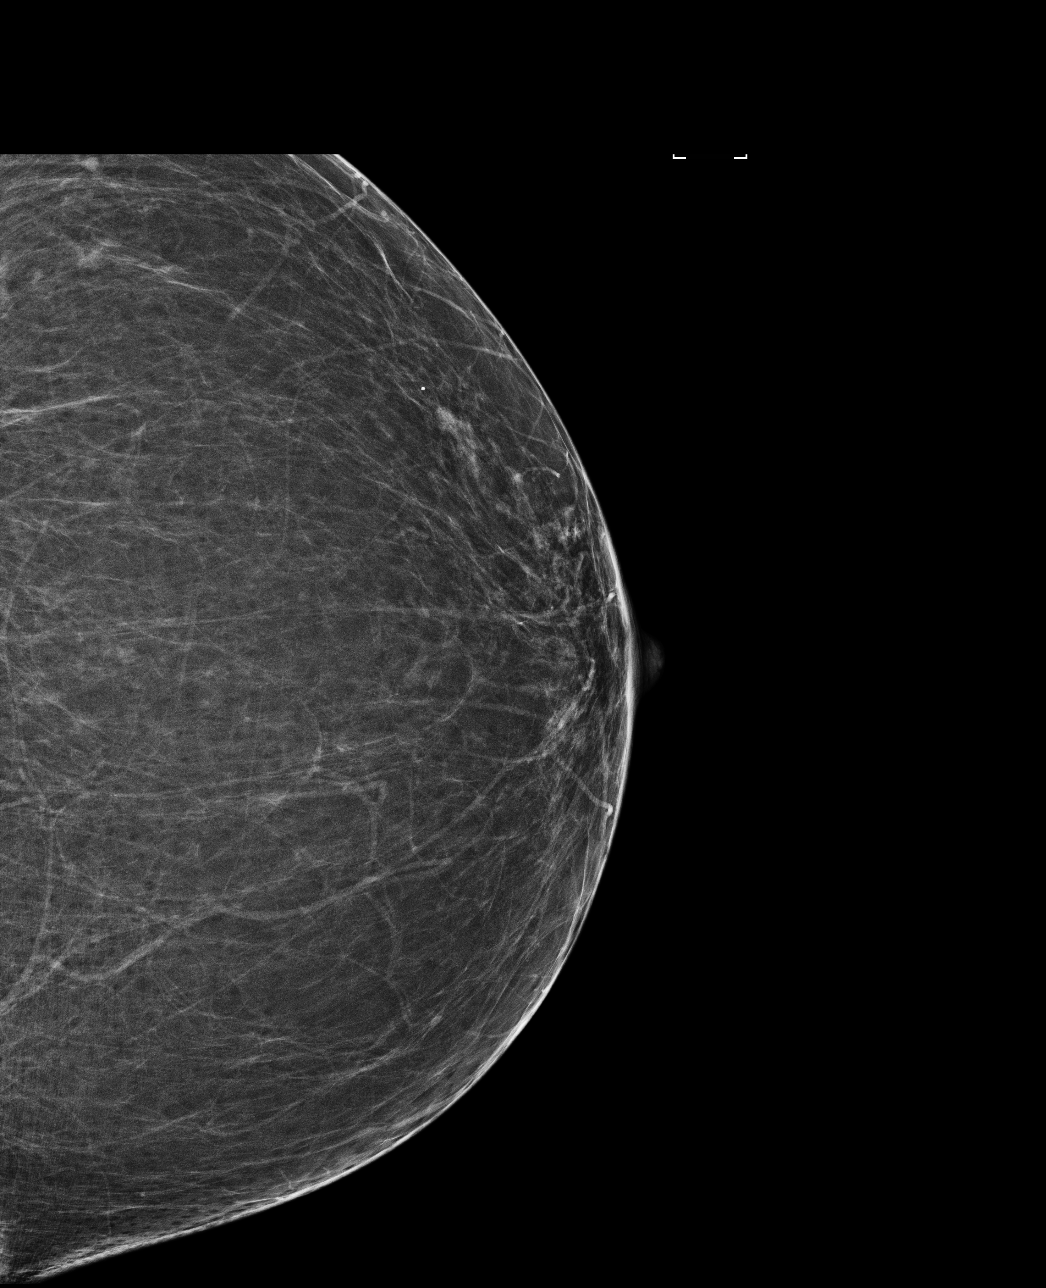

[4 of 4 positions shown; findings below may reference images not displayed]

ACR Breast Density Category b: There are scattered areas of
fibroglandular density.
FINDINGS: There are no findings suspicious for malignancy. Images were
processed with CAD.
IMPRESSION: No mammographic evidence of malignancy. A result letter of this
screening mammogram will be mailed directly to the patient.

RECOMMENDATION:
Screening mammogram in one year. (Code:AS-G-LCT)

BI-RADS CATEGORY  1: Negative.

## 2017-09-14 ENCOUNTER — Ambulatory Visit (INDEPENDENT_AMBULATORY_CARE_PROVIDER_SITE_OTHER): Payer: Medicare Other | Admitting: Family Medicine

## 2017-09-14 ENCOUNTER — Encounter: Payer: Self-pay | Admitting: Family Medicine

## 2017-09-14 VITALS — BP 125/76 | HR 85 | Temp 98.3°F | Wt 185.1 lb

## 2017-09-14 DIAGNOSIS — M5441 Lumbago with sciatica, right side: Secondary | ICD-10-CM

## 2017-09-14 DIAGNOSIS — M5442 Lumbago with sciatica, left side: Secondary | ICD-10-CM | POA: Diagnosis not present

## 2017-09-14 MED ORDER — PREDNISONE 10 MG PO TABS: ORAL_TABLET | ORAL | 0 refills | Status: DC

## 2017-09-14 MED ORDER — TRIAMCINOLONE ACETONIDE 40 MG/ML IJ SUSP
40.0000 mg | Freq: Once | INTRAMUSCULAR | Status: AC
Start: 2017-09-14 — End: 2017-09-14
  Administered 2017-09-14: 40 mg via INTRAMUSCULAR

## 2017-09-14 MED ORDER — CYCLOBENZAPRINE HCL 5 MG PO TABS: 5.0000 mg | ORAL_TABLET | Freq: Three times a day (TID) | ORAL | 1 refills | Status: DC | PRN

## 2017-09-14 MED ORDER — TRAMADOL HCL 50 MG PO TABS: 50.0000 mg | ORAL_TABLET | Freq: Four times a day (QID) | ORAL | 0 refills | Status: DC | PRN

## 2017-09-14 NOTE — Progress Notes (Signed)
BP 125/76 (BP Location: Right Arm, Patient Position: Sitting, Cuff Size: Normal)   Pulse 85   Temp 98.3 F (36.8 C) (Oral)   Wt 185 lb 1.6 oz (84 kg)   LMP  (LMP Unknown)   SpO2 99%   BMI 31.28 kg/m    Subjective:    Patient ID: Mary Parks, female    DOB: [birth year], 67 y.o.   MRN: [id number]  HPI: Mary Parks is a 67 y.o. female  Chief Complaint  Patient presents with  . Back Pain    Bilateral. Went to Urgent Care 3 weeks ago. Stated it was her sciatic nerve. Medication they gave her helped. Pain is now back and worse. Radiating down legs.   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 Was given steroids, IM toradol, valium, and supportive care instructions. Had temporary relief on medications but sxs returned once medications were completed. Having b/l radiating pain down to feet. No numbness, weakness, incontinence, fevers, loss of ROM. No known hx of back issues.   Relevant past medical, surgical, family and social history reviewed and updated as indicated. Interim medical history since our last visit reviewed. Allergies and medications reviewed and updated.  Review of Systems  Per HPI unless specifically indicated above     Objective:    BP 125/76 (BP Location: Right Arm, Patient Position: Sitting, Cuff Size: Normal)   Pulse 85   Temp 98.3 F (36.8 C) (Oral)   Wt 185 lb 1.6 oz (84 kg)   LMP  (LMP Unknown)   SpO2 99%   BMI 31.28 kg/m   Wt Readings from Last 3 Encounters:  09/14/17 185 lb 1.6 oz (84 kg)  08/02/17 180 lb (81.6 kg)  06/29/17 185 lb 12.8 oz (84.3 kg)    Physical Exam  Constitutional: She is oriented to person, place, and time. She appears well-developed and well-nourished. No distress.  HENT:  Head: Atraumatic.  Eyes: EOM are normal.  Neck: Normal range of motion. Neck supple.  Cardiovascular: Normal rate, regular rhythm and intact distal pulses.    Pulmonary/Chest: Effort normal and breath sounds normal. No respiratory distress.  Musculoskeletal:  Antalgic gait No midline spinal ttp B/l low back ttp laterally - SLR b/l  Neurological: She is alert and oriented to person, place, and time. No sensory deficit. She exhibits normal muscle tone. Coordination normal.  Skin: Skin is warm and dry.  Psychiatric: She has a normal mood and affect. Her behavior is normal.  Nursing note and vitals reviewed.   Results for orders placed or performed in visit on 06/29/17  Comprehensive metabolic panel  Result Value Ref Range   Glucose 297 (H) 65 - 99 mg/dL   BUN 12 8 - 27 mg/dL   Creatinine, Ser 1.13 (H) 0.57 - 1.00 mg/dL   GFR calc non Af Amer 50 (L) >59 mL/min/1.73   GFR calc Af Amer 57 (L) >59 mL/min/1.73   BUN/Creatinine Ratio 11 (L) 12 - 28   Sodium 145 (H) 134 - 144 mmol/L   Potassium 4.2 3.5 - 5.2 mmol/L   Chloride 104 96 - 106 mmol/L   CO2 24 20 - 29 mmol/L   Calcium 9.6 8.7 - 10.3 mg/dL   Total Protein 7.0 6.0 - 8.5 g/dL   Albumin 4.1 3.6 - 4.8 g/dL   Globulin, Total 2.9 1.5 - 4.5 g/dL   Albumin/Globulin Ratio 1.4 1.2 -  2.2   Bilirubin Total 0.3 0.0 - 1.2 mg/dL   Alkaline Phosphatase 107 39 - 117 IU/L   AST 38 0 - 40 IU/L   ALT 27 0 - 32 IU/L  Lipid Panel w/o Chol/HDL Ratio  Result Value Ref Range   Cholesterol, Total 149 100 - 199 mg/dL   Triglycerides 170 (H) 0 - 149 mg/dL   HDL 39 (L) >39 mg/dL   VLDL Cholesterol Cal 34 5 - 40 mg/dL   LDL Calculated 76 0 - 99 mg/dL  TSH  Result Value Ref Range   TSH 1.220 0.450 - 4.500 uIU/mL  CBC with Differential/Platelet  Result Value Ref Range   WBC 3.4 3.4 - 10.8 x10E3/uL   RBC 4.79 3.77 - 5.28 x10E6/uL   Hemoglobin 13.3 11.1 - 15.9 g/dL   Hematocrit 40.1 34.0 - 46.6 %   MCV 84 79 - 97 fL   MCH 27.8 26.6 - 33.0 pg   MCHC 33.2 31.5 - 35.7 g/dL   RDW 14.9 12.3 - 15.4 %   Platelets 201 150 - 379 x10E3/uL   Neutrophils 58 Not Estab. %   Lymphs 31 Not Estab. %   Monocytes 6  Not Estab. %   Eos 4 Not Estab. %   Basos 1 Not Estab. %   Neutrophils Absolute 2.0 1.4 - 7.0 x10E3/uL   Lymphocytes Absolute 1.0 0.7 - 3.1 x10E3/uL   Monocytes Absolute 0.2 0.1 - 0.9 x10E3/uL   EOS (ABSOLUTE) 0.1 0.0 - 0.4 x10E3/uL   Basophils Absolute 0.0 0.0 - 0.2 x10E3/uL   Immature Granulocytes 0 Not Estab. %   Immature Grans (Abs) 0.0 0.0 - 0.1 x10E3/uL      Assessment & Plan:   Problem List Items Addressed This Visit    None    Visit Diagnoses    Acute bilateral low back pain with bilateral sciatica    -  Primary   Will extend steroids, low dose flexeril at bedtime, another dose of IM toradol, and tramadol prn for severe sxs. Stretches given, heat and epsom soaks prn   Relevant Medications   cyclobenzaprine (FLEXERIL) 5 MG tablet   predniSONE (DELTASONE) 10 MG tablet   traMADol (ULTRAM) 50 MG tablet   triamcinolone acetonide (KENALOG-40) injection 40 mg (Completed)    Sedation, addiction, and driving precautions reviewed at length with tramadol and flexeril. F/u if worsening or no improvement. Will get lumbar films if no improvement after this tx course   Follow up plan: Return if symptoms worsen or fail to improve.

## 2017-09-14 NOTE — Patient Instructions (Signed)
Piriformis Syndrome Rehab Ask your health care provider which exercises are safe for you. Do exercises exactly as told by your health care provider and adjust them as directed. It is normal to feel mild stretching, pulling, tightness, or discomfort as you do these exercises, but you should stop right away if you feel sudden pain or your pain gets worse.Do not begin these exercises until told by your health care provider. Stretching and range of motion exercises These exercises warm up your muscles and joints and improve the movement and flexibility of your hip and pelvis. These exercises also help to relieve pain, numbness, and tingling. Exercise A: Hip rotators  1. Lie on your back on a firm surface. 2. Pull your left / right knee toward your same shoulder with your left / right hand until your knee is pointing toward the ceiling. Hold your left / right ankle with your other hand. 3. Keeping your knee steady, gently pull your left / right ankle toward your other shoulder until you feel a stretch in your buttocks. 4. Hold this position for __________ seconds. Repeat __________ times. Complete this stretch __________ times a day. Exercise B: Hip extensors 1. Lie on your back on a firm surface. Both of your legs should be straight. 2. Pull your left / right knee to your chest. Hold your leg in this position by holding onto the back of your thigh or the front of your knee. 3. Hold this position for __________ seconds. 4. Slowly return to the starting position. Repeat __________ times. Complete this stretch __________ times a day. Strengthening exercises These exercises build strength and endurance in your hip and thigh muscles. Endurance is the ability to use your muscles for a long time, even after they get tired. Exercise C: Straight leg raises ( hip abductors) 1. Lie on your side with your left / right leg in the top position. Lie so your head, shoulder, knee, and hip line up. Bend your bottom  knee to help you balance. 2. Lift your top leg up 4-6 inches (10-15 cm), keeping your toes pointed straight ahead. 3. Hold this position for __________ seconds. 4. Slowly lower your leg to the starting position. Let your muscles relax completely. Repeat __________ times. Complete this exercise__________ times a day. Exercise D: Hip abductors and rotators, quadruped  1. Get on your hands and knees on a firm, lightly padded surface. Your hands should be directly below your shoulders, and your knees should be directly below your hips. 2. Lift your left / right knee out to the side. Keep your knee bent. Do not twist your body. 3. Hold this position for __________ seconds. 4. Slowly lower your leg. Repeat __________ times. Complete this exercise__________ times a day. Exercise E: Straight leg raises ( hip extensors) 1. Lie on your abdomen on a bed or a firm surface with a pillow under your hips. 2. Squeeze your buttock muscles and lift your left / right thigh off the bed. Do not let your back arch. 3. Hold this position for __________ seconds. 4. Slowly return to the starting position. Let your muscles relax completely before doing another repetition. Repeat __________ times. Complete this exercise__________ times a day. This information is not intended to replace advice given to you by your health care provider. Make sure you discuss any questions you have with your health care provider. Document Released: 05/10/2005 Document Revised: 01/13/2016 Document Reviewed: 04/22/2015 Elsevier Interactive Patient Education  2018 [employer] Inc[employer]  

## 2017-09-27 DIAGNOSIS — H524 Presbyopia: Secondary | ICD-10-CM | POA: Diagnosis not present

## 2017-09-28 LAB — HM DIABETES EYE EXAM

## 2017-10-05 ENCOUNTER — Other Ambulatory Visit: Payer: Self-pay | Admitting: Family Medicine

## 2017-10-05 NOTE — Telephone Encounter (Signed)
Should be getting this through [facility]

## 2017-10-05 NOTE — Telephone Encounter (Signed)
Your patient 

## 2017-10-07 ENCOUNTER — Other Ambulatory Visit: Payer: Self-pay | Admitting: Family Medicine

## 2017-10-10 ENCOUNTER — Other Ambulatory Visit: Payer: Self-pay | Admitting: Family Medicine

## 2017-10-10 ENCOUNTER — Telehealth: Payer: Self-pay | Admitting: Unknown Physician Specialty

## 2017-10-10 MED ORDER — INSULIN GLARGINE 100 UNIT/ML SOLOSTAR PEN: 36.0000 [IU] | PEN_INJECTOR | Freq: Every day | SUBCUTANEOUS | 12 refills | Status: DC

## 2017-10-10 NOTE — Telephone Encounter (Signed)
Last A1C 06/25/16  Lantus Solostar LOV: 9/11/8 Last Refill: 09/14/17 PCP: [place]: Matt Holmes, [place]

## 2017-10-10 NOTE — Addendum Note (Signed)
Addended by: Kathrine Haddock on: 10/10/2017 04:04 PM   Modules accepted: Orders

## 2017-10-10 NOTE — Telephone Encounter (Signed)
Copied from [place] [id number]. Topic: Quick Communication - Rx Refill/Question >> Oct 10, 2017 12:37 PM Percell Belt A wrote: Medication: LANTUS SOLOSTAR 100 UNIT/ML Solostar Pen [Pharmacy Med   Has the patient contacted their pharmacy? No  (Agent: If no, request that the patient contact the pharmacy for the refill.) (Agent: If yes, when and what did the pharmacy advise?)  Preferred Pharmacy (with phone number or street name): [facility] [email or web address]   Agent: Please be advised that RX refills may take up to 3 business days. We ask that you follow-up with your pharmacy.

## 2017-10-26 DIAGNOSIS — E032 Hypothyroidism due to medicaments and other exogenous substances: Secondary | ICD-10-CM | POA: Diagnosis not present

## 2017-10-26 DIAGNOSIS — M899 Disorder of bone, unspecified: Secondary | ICD-10-CM | POA: Diagnosis not present

## 2017-10-26 DIAGNOSIS — I10 Essential (primary) hypertension: Secondary | ICD-10-CM | POA: Diagnosis not present

## 2017-10-26 DIAGNOSIS — E785 Hyperlipidemia, unspecified: Secondary | ICD-10-CM | POA: Diagnosis not present

## 2017-10-26 DIAGNOSIS — E1165 Type 2 diabetes mellitus with hyperglycemia: Secondary | ICD-10-CM | POA: Diagnosis not present

## 2017-10-31 MED ORDER — INSULIN GLARGINE 100 UNIT/ML SOLOSTAR PEN: 36.0000 [IU] | PEN_INJECTOR | Freq: Every day | SUBCUTANEOUS | 12 refills | Status: DC

## 2017-10-31 NOTE — Addendum Note (Signed)
Addended by: Kathrine Haddock on: 10/31/2017 11:55 AM   Modules accepted: Orders

## 2017-10-31 NOTE — Telephone Encounter (Signed)
Pt states that the pharmacy stated her Insulin Glargine (LANTUS SOLOSTAR) 100 UNIT/ML Solostar Pen  Needed to be re-wrote for 2 times a day instead on once. Pt states she always takes the insulin twice a day.  [facility] [address], [place] - [place] [phone] (Phone) [phone] (Fax)

## 2017-11-07 ENCOUNTER — Other Ambulatory Visit: Payer: Self-pay | Admitting: Unknown Physician Specialty

## 2017-11-10 NOTE — Telephone Encounter (Signed)
Atorvastatin 40 mg and Trulicity 3.2/4.1 ml SOPN refill requests  LOV 02/01/17 with [facility] where these meds were addressed.    Last A1C 06/25/16 so not approved per protocol.   Needs appt.  [place], [place]

## 2017-11-22 DIAGNOSIS — I151 Hypertension secondary to other renal disorders: Secondary | ICD-10-CM | POA: Diagnosis not present

## 2017-11-22 DIAGNOSIS — N2889 Other specified disorders of kidney and ureter: Secondary | ICD-10-CM | POA: Diagnosis not present

## 2017-11-23 ENCOUNTER — Other Ambulatory Visit: Payer: Self-pay | Admitting: Unknown Physician Specialty

## 2017-11-25 NOTE — Telephone Encounter (Signed)
Lisinopril 20 mg refill request  LOV 02/01/17 with Kathrine Haddock.   No future appts noted.  [id number] - [place], [place]

## 2017-11-29 ENCOUNTER — Other Ambulatory Visit: Payer: Self-pay

## 2017-11-29 MED ORDER — INSULIN GLARGINE 100 UNIT/ML SOLOSTAR PEN: 36.0000 [IU] | PEN_INJECTOR | Freq: Two times a day (BID) | SUBCUTANEOUS | 4 refills | Status: DC

## 2017-12-05 ENCOUNTER — Encounter: Payer: Self-pay | Admitting: Unknown Physician Specialty

## 2017-12-19 ENCOUNTER — Ambulatory Visit (INDEPENDENT_AMBULATORY_CARE_PROVIDER_SITE_OTHER): Payer: Medicare Other

## 2017-12-19 VITALS — BP 132/84 | HR 81 | Temp 98.4°F | Resp 16 | Ht 64.0 in | Wt 183.8 lb

## 2017-12-19 DIAGNOSIS — Z0000 Encounter for general adult medical examination without abnormal findings: Secondary | ICD-10-CM

## 2017-12-19 NOTE — Patient Instructions (Signed)
Mary Parks , Thank you for taking time to come for your Medicare Wellness Visit. I appreciate your ongoing commitment to your health goals. Please review the following plan we discussed and let me know if I can assist you in the future.   Screening recommendations/referrals: Colonoscopy: completed 11/12/2016 Mammogram: completed 08/17/2017 Bone Density: completed  Recommended yearly ophthalmology/optometry visit for glaucoma screening and checkup Recommended yearly dental visit for hygiene and checkup  Vaccinations: Influenza vaccine: due 01/2018 Pneumococcal vaccine: completed series  Tdap vaccine: up to date  Shingles vaccine: shingrix eligible, check with your insurance company for coverage   Advanced directives: Advance directive discussed with you today. I have provided a copy for you to complete at home and have notarized. Once this is complete please bring a copy in to our office so we can scan it into your chart.  Conditions/risks identified: Recommend drinking at least 6-8 glasses of water a day   Follow up in one year for your annual wellness exam.   Preventive Care 65 Years and Older, Female Preventive care refers to lifestyle choices and visits with your health care provider that can promote health and wellness. What does preventive care include?  A yearly physical exam. This is also called an annual well check.  Dental exams once or twice a year.  Routine eye exams. Ask your health care provider how often you should have your eyes checked.  Personal lifestyle choices, including:  Daily care of your teeth and gums.  Regular physical activity.  Eating a healthy diet.  Avoiding tobacco and drug use.  Limiting alcohol use.  Practicing safe sex.  Taking low-dose aspirin every day.  Taking vitamin and mineral supplements as recommended by your health care provider. What happens during an annual well check? The services and screenings done by your health care  provider during your annual well check will depend on your age, overall health, lifestyle risk factors, and family history of disease. Counseling  Your health care provider may ask you questions about your:  Alcohol use.  Tobacco use.  Drug use.  Emotional well-being.  Home and relationship well-being.  Sexual activity.  Eating habits.  History of falls.  Memory and ability to understand (cognition).  Work and work Statistician.  Reproductive health. Screening  You may have the following tests or measurements:  Height, weight, and BMI.  Blood pressure.  Lipid and cholesterol levels. These may be checked every 5 years, or more frequently if you are over 22 years old.  Skin check.  Lung cancer screening. You may have this screening every year starting at age 50 if you have a 30-pack-year history of smoking and currently smoke or have quit within the past 15 years.  Fecal occult blood test (FOBT) of the stool. You may have this test every year starting at age 58.  Flexible sigmoidoscopy or colonoscopy. You may have a sigmoidoscopy every 5 years or a colonoscopy every 10 years starting at age 48.  Hepatitis C blood test.  Hepatitis B blood test.  Sexually transmitted disease (STD) testing.  Diabetes screening. This is done by checking your blood sugar (glucose) after you have not eaten for a while (fasting). You may have this done every 1-3 years.  Bone density scan. This is done to screen for osteoporosis. You may have this done starting at age 81.  Mammogram. This may be done every 1-2 years. Talk to your health care provider about how often you should have regular mammograms. Talk with your  health care provider about your test results, treatment options, and if necessary, the need for more tests. Vaccines  Your health care provider may recommend certain vaccines, such as:  Influenza vaccine. This is recommended every year.  Tetanus, diphtheria, and acellular  pertussis (Tdap, Td) vaccine. You may need a Td booster every 10 years.  Zoster vaccine. You may need this after age 103.  Pneumococcal 13-valent conjugate (PCV13) vaccine. One dose is recommended after age 51.  Pneumococcal polysaccharide (PPSV23) vaccine. One dose is recommended after age 34. Talk to your health care provider about which screenings and vaccines you need and how often you need them. This information is not intended to replace advice given to you by your health care provider. Make sure you discuss any questions you have with your health care provider. Document Released: 06/06/2015 Document Revised: 01/28/2016 Document Reviewed: 03/11/2015 Elsevier Interactive Patient Education  2017 [place] Prevention in the Home Falls can cause injuries. They can happen to people of all ages. There are many things you can do to make your home safe and to help prevent falls. What can I do on the outside of my home?  Regularly fix the edges of walkways and driveways and fix any cracks.  Remove anything that might make you trip as you walk through a door, such as a raised step or threshold.  Trim any bushes or trees on the path to your home.  Use bright outdoor lighting.  Clear any walking paths of anything that might make someone trip, such as rocks or tools.  Regularly check to see if handrails are loose or broken. Make sure that both sides of any steps have handrails.  Any raised decks and porches should have guardrails on the edges.  Have any leaves, snow, or ice cleared regularly.  Use sand or salt on walking paths during winter.  Clean up any spills in your garage right away. This includes oil or grease spills. What can I do in the bathroom?  Use night lights.  Install grab bars by the toilet and in the tub and shower. Do not use towel bars as grab bars.  Use non-skid mats or decals in the tub or shower.  If you need to sit down in the shower, use a plastic,  non-slip stool.  Keep the floor dry. Clean up any water that spills on the floor as soon as it happens.  Remove soap buildup in the tub or shower regularly.  Attach bath mats securely with double-sided non-slip rug tape.  Do not have throw rugs and other things on the floor that can make you trip. What can I do in the bedroom?  Use night lights.  Make sure that you have a light by your bed that is easy to reach.  Do not use any sheets or blankets that are too big for your bed. They should not hang down onto the floor.  Have a firm chair that has side arms. You can use this for support while you get dressed.  Do not have throw rugs and other things on the floor that can make you trip. What can I do in the kitchen?  Clean up any spills right away.  Avoid walking on wet floors.  Keep items that you use a lot in easy-to-reach places.  If you need to reach something above you, use a strong step stool that has a grab bar.  Keep electrical cords out of the way.  Do not use  floor polish or wax that makes floors slippery. If you must use wax, use non-skid floor wax.  Do not have throw rugs and other things on the floor that can make you trip. What can I do with my stairs?  Do not leave any items on the stairs.  Make sure that there are handrails on both sides of the stairs and use them. Fix handrails that are broken or loose. Make sure that handrails are as long as the stairways.  Check any carpeting to make sure that it is firmly attached to the stairs. Fix any carpet that is loose or worn.  Avoid having throw rugs at the top or bottom of the stairs. If you do have throw rugs, attach them to the floor with carpet tape.  Make sure that you have a light switch at the top of the stairs and the bottom of the stairs. If you do not have them, ask someone to add them for you. What else can I do to help prevent falls?  Wear shoes that:  Do not have high heels.  Have rubber  bottoms.  Are comfortable and fit you well.  Are closed at the toe. Do not wear sandals.  If you use a stepladder:  Make sure that it is fully opened. Do not climb a closed stepladder.  Make sure that both sides of the stepladder are locked into place.  Ask someone to hold it for you, if possible.  Clearly mark and make sure that you can see:  Any grab bars or handrails.  First and last steps.  Where the edge of each step is.  Use tools that help you move around (mobility aids) if they are needed. These include:  Canes.  Walkers.  Scooters.  Crutches.  Turn on the lights when you go into a dark area. Replace any light bulbs as soon as they burn out.  Set up your furniture so you have a clear path. Avoid moving your furniture around.  If any of your floors are uneven, fix them.  If there are any pets around you, be aware of where they are.  Review your medicines with your doctor. Some medicines can make you feel dizzy. This can increase your chance of falling. Ask your doctor what other things that you can do to help prevent falls. This information is not intended to replace advice given to you by your health care provider. Make sure you discuss any questions you have with your health care provider. Document Released: 03/06/2009 Document Revised: 10/16/2015 Document Reviewed: 06/14/2014 Elsevier Interactive Patient Education  2017 [employer].

## 2017-12-19 NOTE — Progress Notes (Signed)
Subjective:   Mary Parks is a 67 y.o. female who presents for Medicare Annual (Subsequent) preventive examination.  Review of Systems:   Cardiac Risk Factors include: advanced age (>32men, >62 women);diabetes mellitus;dyslipidemia;hypertension;obesity (BMI >30kg/m2)     Objective:     Vitals: BP 132/84 (BP Location: Left Arm, Patient Position: Sitting)   Pulse 81   Temp 98.4 F (36.9 C) (Temporal)   Resp 16   Ht 5\' 4"  (1.626 m)   Wt 183 lb 12.8 oz (83.4 kg)   LMP  (LMP Unknown)   BMI 31.55 kg/m   Body mass index is 31.55 kg/m.  Advanced Directives 12/19/2017 08/02/2017 11/26/2016 11/12/2016 04/14/2015 04/14/2015 04/08/2015  Does Patient Have a Medical Advance Directive? No No No No No No No  Would patient like information on creating a medical advance directive? Yes (MAU/Ambulatory/Procedural Areas - Information given) - Yes (MAU/Ambulatory/Procedural Areas - Information given) Yes (MAU/Ambulatory/Procedural Areas - Information given) Yes - Educational materials given;Yes - Spiritual care consult ordered No - patient declined information -    Tobacco Social History   Tobacco Use  Smoking Status Never Smoker  Smokeless Tobacco Never Used     Counseling given: Not Answered   Clinical Intake:  Pre-visit preparation completed: Yes  Pain : No/denies pain     Nutritional Status: BMI > 30  Obese Nutritional Risks: None Diabetes: Yes CBG done?: No Did pt. bring in CBG monitor from home?: No  How often do you need to have someone help you when you read instructions, pamphlets, or other written materials from your doctor or pharmacy?: 1 - Never What is the last grade level you completed in school?: some college   Interpreter Needed?: No  Information entered by :: Dyshaun Bonzo,LPN   Past Medical History:  Diagnosis Date  . Asthma   . Chest pain   . DM2 (diabetes mellitus, type 2) ([place]) 1987  . Dyspnea    Cath normal coronaries. normal EF. RA 11. PA 37/17 (26)  LVEDP 36  . GERD (gastroesophageal reflux disease)   . HTN (hypertension)   . Hyperlipidemia   . Obesity   . Osteoarthritis    knees  . Renal disease   . Thyroid disease    having thyroid removed 04/14/15  . Vertigo    none recently   Past Surgical History:  Procedure Laterality Date  . CARDIAC CATHETERIZATION    . CATARACT EXTRACTION W/PHACO Right 03/31/2015   Procedure: CATARACT EXTRACTION PHACO AND INTRAOCULAR LENS PLACEMENT (IOC);  Surgeon: Ronnell Freshwater, MD;  Location: [facility];  Service: Ophthalmology;  Laterality: Right;  DIABETIC - insulin  . COLONOSCOPY WITH PROPOFOL N/A 11/12/2016   Procedure: COLONOSCOPY WITH PROPOFOL;  Surgeon: [name], MD;  Location: [facility];  Service: Endoscopy;  Laterality: N/A;  Diabetic - insulin  . EYE SURGERY    . HERNIA REPAIR     umbilical  . POLYPECTOMY  11/12/2016   Procedure: POLYPECTOMY INTESTINAL;  Surgeon: [name], MD;  Location: [facility];  Service: Endoscopy;;  . THYROIDECTOMY N/A 04/14/2015   Procedure: THYROIDECTOMY;  Surgeon: Margaretha Sheffield, MD;  Location: [facility];  Service: ENT;  Laterality: N/A;   Family History  Problem Relation Age of Onset  . Diabetes Mother   . Diabetes Father   . Diabetes Sister   . Diabetes Brother   . Diabetes Sister   . Diabetes Sister   . Diabetes Maternal Grandmother   . Diabetes Maternal Grandfather   .  Diabetes Paternal Grandmother   . Diabetes Paternal Grandfather   . Heart disease Neg Hx   . Coronary artery disease Neg Hx   . Breast cancer Neg Hx    Social History   Socioeconomic History  . Marital status: Widowed    Spouse name: Not on file  . Number of children: Not on file  . Years of education: Not on file  . Highest education level: Some college, no degree  Occupational History  . 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 Financial resource strain: Not hard at all  . Food  insecurity:    Worry: Never true    Inability: Never true  . Transportation needs:    Medical: No    Non-medical: No  Tobacco Use  . Smoking status: Never Smoker  . Smokeless tobacco: Never Used  Substance and Sexual Activity  . Alcohol use: No  . Drug use: No  . Sexual activity: Never  Lifestyle  . Physical activity:    Days per week: 0 days    Minutes per session: 0 min  . Stress: Not at all  Relationships  . Social connections:    Talks on phone: More than three times a week    Gets together: More than three times a week    Attends religious service: More than 4 times per year    Active member of club or organization: Yes    Attends meetings of clubs or organizations: More than 4 times per year    Relationship status: Widowed  Other Topics Concern  . Not on file  Social History Narrative  . Not on file    Outpatient Encounter Medications as of 12/19/2017  Medication Sig  . albuterol (PROVENTIL HFA;VENTOLIN HFA) 108 (90 Base) MCG/ACT inhaler Inhale 2 puffs into the lungs every 6 (six) hours as needed for wheezing or shortness of breath.  Marland Kitchen amLODipine (NORVASC) 2.5 MG tablet Take by mouth.  Marland Kitchen aspirin EC 81 MG tablet Take 81 mg by mouth daily.  Marland Kitchen atorvastatin (LIPITOR) 40 MG tablet TAKE 1 TABLET BY MOUTH AT BEDTIME  . budesonide-formoterol (SYMBICORT) 160-4.5 MCG/ACT inhaler Inhale 2 puffs into the lungs 2 (two) times daily.  . calcium-vitamin D (OSCAL WITH D) 500-200 MG-UNIT tablet Take 1 tablet by mouth.  . cetirizine (ZYRTEC) 10 MG tablet Take 10 mg by mouth daily.  . fluticasone (FLONASE) 50 MCG/ACT nasal spray Place 2 sprays into both nostrils daily.  Marland Kitchen HUMALOG KWIKPEN 100 UNIT/ML KiwkPen INJECT 20 UNITS SUBCUTANEOUSLY BEFORE BREAKFAST AND 20 UNITS BEFORE LUNCH AND 30 UNITS BEFORE EVENING MEAL  . Insulin Glargine (LANTUS SOLOSTAR) 100 UNIT/ML Solostar Pen Inject 36 Units into the skin 2 (two) times daily.  Marland Kitchen levothyroxine (SYNTHROID, LEVOTHROID) 100 MCG tablet Take 100  mcg by mouth daily.  Marland Kitchen lisinopril (PRINIVIL,ZESTRIL) 20 MG tablet TAKE 1 TABLET BY MOUTH ONCE DAILY  . LYRICA 100 MG capsule TAKE 1 CAPSULE BY MOUTH ONCE DAILY  . metoprolol tartrate (LOPRESSOR) 25 MG tablet Take 25 mg by mouth 2 (two) times daily.  . montelukast (SINGULAIR) 10 MG tablet TAKE ONE TABLET BY MOUTH ONCE DAILY  . omeprazole (PRILOSEC) 20 MG capsule Take 1 capsule (20 mg total) by mouth daily.  . ONE TOUCH ULTRA TEST test strip 1 each by Other route 2 (two) times daily.   [name] DELICA LANCETS 02V MISC 2 (two) times daily.   . TRULICITY 1.5 OZ/3.6UY SOPN INJECT  1.5 (1 SYRINGE) INTO THE SKIN ONCE A WEEK  . cyclobenzaprine (FLEXERIL) 5 MG tablet Take 1 tablet (5 mg total) by mouth 3 (three) times daily as needed for muscle spasms. (Patient not taking: Reported on 12/19/2017)  . predniSONE (DELTASONE) 10 MG tablet Take 6 tabs day one, 5 tabs day two, 4 tabs day three, etc (Patient not taking: Reported on 12/19/2017)  . traMADol (ULTRAM) 50 MG tablet Take 1 tablet (50 mg total) by mouth every 6 (six) hours as needed. (Patient not taking: Reported on 12/19/2017)   No facility-administered encounter medications on file as of 12/19/2017.     Activities of Daily Living In your present state of health, do you have any difficulty performing the following activities: 12/19/2017  Hearing? N  Vision? N  Difficulty concentrating or making decisions? N  Walking or climbing stairs? Y  Comment bad knees  Dressing or bathing? N  Doing errands, shopping? N  Preparing Food and eating ? N  Using the Toilet? N  In the past six months, have you accidently leaked urine? N  Do you have problems with loss of bowel control? N  Managing your Medications? N  Managing your Finances? N  Housekeeping or managing your Housekeeping? N  Some recent data might be hidden    Patient Care Team: Kathrine Haddock, NP as PCP - General (Nurse Practitioner) K[place], Thomasena Edis, MD as Referring Physician  (Nephrology)    Assessment:   This is a routine wellness examination for [place].  Exercise Activities and Dietary recommendations Current Exercise Habits: The patient does not participate in regular exercise at present, Exercise limited by: None identified  Goals    . DIET - INCREASE WATER INTAKE     Recommend drinking at least 6-8 glasses of water a day        Fall Risk Fall Risk  12/19/2017 11/26/2016 10/03/2015  Falls in the past year? No No No   Is the patient's home free of loose throw rugs in walkways, pet beds, electrical cords, etc?   yes      Grab bars in the bathroom? yes      Handrails on the stairs?   no stairs       Adequate lighting?   yes  Timed Get Up and Go performed: Completed in 8 seconds with no use of assistive devices, steady gait. No intervention needed at this time.   Depression Screen PHQ 2/9 Scores 12/19/2017 11/26/2016 10/03/2015  PHQ - 2 Score 0 0 0     Cognitive Function     6CIT Screen 12/19/2017 11/26/2016  What Year? 0 points 0 points  What month? 0 points 0 points  What time? 0 points 0 points  Count back from 20 0 points 0 points  Months in reverse 0 points 0 points  Repeat phrase 2 points 0 points  Total Score 2 0    Immunization History  Administered Date(s) Administered  . Influenza,inj,Quad PF,6+ Mos 03/25/2015  . Influenza-Unspecified 02/21/2014, 03/31/2016, 04/01/2017  . Pneumococcal Conjugate-13 09/26/2013  . Pneumococcal Polysaccharide-23 03/13/2008, 03/25/2015  . Td 03/13/2008    Qualifies for Shingles Vaccine? Yes, discussed shingrix vaccine   Screening Tests Health Maintenance  Topic Date Due  . HEMOGLOBIN A1C  12/23/2016  . FOOT EXAM  06/28/2017  . INFLUENZA VACCINE  12/22/2017  . TETANUS/TDAP  03/13/2018  . OPHTHALMOLOGY EXAM  09/29/2018  . MAMMOGRAM  08/18/2019  . COLONOSCOPY  11/12/2021  . Hepatitis C Screening  Completed  . PNA  vac Low Risk Adult  Completed  . DEXA SCAN  Addressed    Cancer Screenings: Lung:  Low Dose CT Chest recommended if Age 47-80 years, 30 pack-year currently smoking OR have quit w/in 15years. Patient does not qualify. Breast:  Up to date on Mammogram? Yes  08/17/2017 Up to date of Bone Density/Dexa? Yes 01/20/2010 Colorectal: completed 11/12/2016  Additional Screenings:  Hepatitis C Screening: completed 10/03/2015     Plan:    I have personally reviewed and addressed the Medicare Annual Wellness questionnaire and have noted the following in the patient's chart:  A. Medical and social history B. Use of alcohol, tobacco or illicit drugs  C. Current medications and supplements D. Functional ability and status E.  Nutritional status F.  Physical activity G. Advance directives H. List of other physicians I.  Hospitalizations, surgeries, and ER visits in previous 12 months J.  [place] such as hearing and vision if needed, cognitive and depression L. Referrals and appointments   In addition, I have reviewed and discussed with patient certain preventive protocols, quality metrics, and best practice recommendations. A written personalized care plan for preventive services as well as general preventive health recommendations were provided to patient.   Signed,  Tyler Aas, LPN Nurse Health Advisor   Nurse Notes: patient due for diabetic foot exam and A1C check- has 6 month follow up with Dorna Bloom on 12/27/2017.

## 2017-12-21 DIAGNOSIS — E785 Hyperlipidemia, unspecified: Secondary | ICD-10-CM | POA: Diagnosis not present

## 2017-12-21 DIAGNOSIS — I10 Essential (primary) hypertension: Secondary | ICD-10-CM | POA: Diagnosis not present

## 2017-12-21 DIAGNOSIS — M899 Disorder of bone, unspecified: Secondary | ICD-10-CM | POA: Diagnosis not present

## 2017-12-21 DIAGNOSIS — E1165 Type 2 diabetes mellitus with hyperglycemia: Secondary | ICD-10-CM | POA: Diagnosis not present

## 2017-12-27 ENCOUNTER — Encounter: Payer: Self-pay | Admitting: Physician Assistant

## 2017-12-27 ENCOUNTER — Other Ambulatory Visit: Payer: Self-pay

## 2017-12-27 ENCOUNTER — Ambulatory Visit: Payer: Medicare Other | Admitting: Unknown Physician Specialty

## 2017-12-27 ENCOUNTER — Ambulatory Visit (INDEPENDENT_AMBULATORY_CARE_PROVIDER_SITE_OTHER): Payer: Medicare Other | Admitting: Physician Assistant

## 2017-12-27 VITALS — BP 136/83 | HR 78 | Temp 98.2°F | Ht 64.0 in | Wt 182.0 lb

## 2017-12-27 DIAGNOSIS — E1140 Type 2 diabetes mellitus with diabetic neuropathy, unspecified: Secondary | ICD-10-CM | POA: Diagnosis not present

## 2017-12-27 DIAGNOSIS — M170 Bilateral primary osteoarthritis of knee: Secondary | ICD-10-CM

## 2017-12-27 DIAGNOSIS — I129 Hypertensive chronic kidney disease with stage 1 through stage 4 chronic kidney disease, or unspecified chronic kidney disease: Secondary | ICD-10-CM

## 2017-12-27 DIAGNOSIS — E890 Postprocedural hypothyroidism: Secondary | ICD-10-CM | POA: Diagnosis not present

## 2017-12-27 DIAGNOSIS — Z794 Long term (current) use of insulin: Secondary | ICD-10-CM

## 2017-12-27 DIAGNOSIS — J410 Simple chronic bronchitis: Secondary | ICD-10-CM | POA: Diagnosis not present

## 2017-12-27 DIAGNOSIS — E1122 Type 2 diabetes mellitus with diabetic chronic kidney disease: Secondary | ICD-10-CM

## 2017-12-27 NOTE — Patient Instructions (Addendum)
Contacting Dr. Mack Guise about injections for knees     Hypertension Hypertension is another name for high blood pressure. High blood pressure forces your heart to work harder to pump blood. This can cause problems over time. There are two numbers in a blood pressure reading. There is a top number (systolic) over a bottom number (diastolic). It is best to have a blood pressure below 120/80. Healthy choices can help lower your blood pressure. You may need medicine to help lower your blood pressure if:  Your blood pressure cannot be lowered with healthy choices.  Your blood pressure is higher than 130/80.  Follow these instructions at home: Eating and drinking  If directed, follow the DASH eating plan. This diet includes: ? Filling half of your plate at each meal with fruits and vegetables. ? Filling one quarter of your plate at each meal with whole grains. Whole grains include whole wheat pasta, brown rice, and whole grain bread. ? Eating or drinking low-fat dairy products, such as skim milk or low-fat yogurt. ? Filling one quarter of your plate at each meal with low-fat (lean) proteins. Low-fat proteins include fish, skinless chicken, eggs, beans, and tofu. ? Avoiding fatty meat, cured and processed meat, or chicken with skin. ? Avoiding premade or processed food.  Eat less than 1,500 mg of salt (sodium) a day.  Limit alcohol use to no more than 1 drink a day for nonpregnant women and 2 drinks a day for men. One drink equals 12 oz of beer, 5 oz of wine, or 1 oz of hard liquor. Lifestyle  Work with your doctor to stay at a healthy weight or to lose weight. Ask your doctor what the best weight is for you.  Get at least 30 minutes of exercise that causes your heart to beat faster (aerobic exercise) most days of the week. This may include walking, swimming, or biking.  Get at least 30 minutes of exercise that strengthens your muscles (resistance exercise) at least 3 days a week. This may  include lifting weights or pilates.  Do not use any products that contain nicotine or tobacco. This includes cigarettes and e-cigarettes. If you need help quitting, ask your doctor.  Check your blood pressure at home as told by your doctor.  Keep all follow-up visits as told by your doctor. This is important. Medicines  Take over-the-counter and prescription medicines only as told by your doctor. Follow directions carefully.  Do not skip doses of blood pressure medicine. The medicine does not work as well if you skip doses. Skipping doses also puts you at risk for problems.  Ask your doctor about side effects or reactions to medicines that you should watch for. Contact a doctor if:  You think you are having a reaction to the medicine you are taking.  You have headaches that keep coming back (recurring).  You feel dizzy.  You have swelling in your ankles.  You have trouble with your vision. Get help right away if:  You get a very bad headache.  You start to feel confused.  You feel weak or numb.  You feel faint.  You get very bad pain in your: ? Chest. ? Belly (abdomen).  You throw up (vomit) more than once.  You have trouble breathing. Summary  Hypertension is another name for high blood pressure.  Making healthy choices can help lower blood pressure. If your blood pressure cannot be controlled with healthy choices, you may need to take medicine. This information is not  intended to replace advice given to you by your health care provider. Make sure you discuss any questions you have with your health care provider. Document Released: 10/27/2007 Document Revised: 04/07/2016 Document Reviewed: 04/07/2016 Elsevier Interactive Patient Education  [employer].

## 2017-12-27 NOTE — Progress Notes (Signed)
Subjective:    Patient ID: Mary Parks, female    DOB: 1950-07-25, 67 y.o.   MRN: [id number]  Mary Parks is a 67 y.o. female presenting on 12/27/2017 for Diabetes; Hypertension; Sore Throat; and Nasal Congestion (ongoing for about a wek with face and ears pain)   HPI   DM: Sees Dr. Rosario Jacks at endocrinology past 2 years in [place], [place]. Last seen 3 months ago. Takes lyrica for diabetic neuropathy.   Lantus 36 units BID Humalog: 6 units morning, 8 lunch, 8 dinner  Trulicity: 1.5 mg subQ once per week Metformin: intolerance stomach upset  Nephrologist: manages BP, seen [facility]   COPD: Symbicort taking daily, doing well. Albuterol as needed.   Severe OA: [facility]. She has had tramadol filled in the past but for limited time. She has had what sounds like synvsyc injections that appear to be helpful to her.   Social History   Tobacco Use  . Smoking status: Never Smoker  . Smokeless tobacco: Never Used  Substance Use Topics  . Alcohol use: No  . Drug use: No    Review of Systems Per HPI unless specifically indicated above     Objective:    BP 136/83   Pulse 78   Temp 98.2 F (36.8 C) (Oral)   Ht '5\' 4"'$  (1.626 m)   Wt 182 lb (82.6 kg)   LMP  (LMP Unknown)   SpO2 97%   BMI 31.24 kg/m   Wt Readings from Last 3 Encounters:  12/27/17 182 lb (82.6 kg)  12/19/17 183 lb 12.8 oz (83.4 kg)  09/14/17 185 lb 1.6 oz (84 kg)    Physical Exam  Constitutional: She appears well-developed and well-nourished.  HENT:  Mouth/Throat: Mucous membranes are normal.  Neck: Normal range of motion. Neck supple.  Cardiovascular: Normal rate and regular rhythm.  Pulmonary/Chest: Effort normal and breath sounds normal.  Skin: Skin is warm and dry.  Psychiatric: She has a normal mood and affect. Her behavior is normal.   Diabetic Foot Exam - Simple   Simple Foot Form Diabetic Foot exam was performed with the following findings:   Yes 12/27/2017  2:01 PM  Visual Inspection No deformities, no ulcerations, no other skin breakdown bilaterally:  Yes Sensation Testing Intact to touch and monofilament testing bilaterally:  Yes Pulse Check Posterior Tibialis and Dorsalis pulse intact bilaterally:  Yes Comments     Results for orders placed or performed in visit on 12/27/17  Comp Met (CMET)  Result Value Ref Range   Glucose 185 (H) 65 - 99 mg/dL   BUN 14 8 - 27 mg/dL   Creatinine, Ser 1.05 (H) 0.57 - 1.00 mg/dL   GFR calc non Af Amer 54 (L) >59 mL/min/1.73   GFR calc Af Amer 63 >59 mL/min/1.73   BUN/Creatinine Ratio 13 12 - 28   Sodium 143 134 - 144 mmol/L   Potassium 4.1 3.5 - 5.2 mmol/L   Chloride 105 96 - 106 mmol/L   CO2 26 20 - 29 mmol/L   Calcium 9.7 8.7 - 10.3 mg/dL   Total Protein 6.9 6.0 - 8.5 g/dL   Albumin 4.0 3.6 - 4.8 g/dL   Globulin, Total 2.9 1.5 - 4.5 g/dL   Albumin/Globulin Ratio 1.4 1.2 - 2.2   Bilirubin Total 0.4 0.0 - 1.2 mg/dL   Alkaline Phosphatase 97 39 - 117 IU/L   AST 23 0 - 40 IU/L   ALT 26  0 - 32 IU/L      Assessment & Plan:  1. 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   - Comp Met (CMET)  2. Type 2 diabetes mellitus with diabetic neuropathy, with long-term current use of insulin ([place])  Unfortunately I ordered the wrong A1C and it was not done at this clinic, can get it next visit. She is managed by endocrinology. Foot exam updated today.  3. Primary osteoarthritis of both knees  Counseled that this clinic does not provide chronic pain medications. Have instructed patient to call Dr. Harden Mo office to schedule Synvsc injections as this seems to have benefitted her in the past.  4. S/P total thyroidectomy  Managed by endocrinologist.   5. COPD  Stable on current inhalers, continue.  Follow up plan: Return in about 4 months (around 04/28/2018) for chronic .  Carles Collet, PA-C [facility] 12/28/2017, 2:03 PM

## 2017-12-28 ENCOUNTER — Other Ambulatory Visit: Payer: Self-pay | Admitting: Physician Assistant

## 2017-12-28 DIAGNOSIS — E1140 Type 2 diabetes mellitus with diabetic neuropathy, unspecified: Secondary | ICD-10-CM

## 2017-12-28 DIAGNOSIS — Z794 Long term (current) use of insulin: Principal | ICD-10-CM

## 2017-12-28 LAB — COMPREHENSIVE METABOLIC PANEL
ALT: 26 IU/L (ref 0–32)
AST: 23 IU/L (ref 0–40)
Albumin/Globulin Ratio: 1.4 (ref 1.2–2.2)
Albumin: 4 g/dL (ref 3.6–4.8)
Alkaline Phosphatase: 97 IU/L (ref 39–117)
BUN/Creatinine Ratio: 13 (ref 12–28)
BUN: 14 mg/dL (ref 8–27)
Bilirubin Total: 0.4 mg/dL (ref 0.0–1.2)
CO2: 26 mmol/L (ref 20–29)
Calcium: 9.7 mg/dL (ref 8.7–10.3)
Chloride: 105 mmol/L (ref 96–106)
Creatinine, Ser: 1.05 mg/dL — ABNORMAL HIGH (ref 0.57–1.00)
GFR calc Af Amer: 63 mL/min/{1.73_m2} (ref >59)
GFR calc non Af Amer: 54 mL/min/{1.73_m2} — ABNORMAL LOW (ref >59)
Globulin, Total: 2.9 g/dL (ref 1.5–4.5)
Glucose: 185 mg/dL — ABNORMAL HIGH (ref 65–99)
Potassium: 4.1 mmol/L (ref 3.5–5.2)
Sodium: 143 mmol/L (ref 134–144)
Total Protein: 6.9 g/dL (ref 6.0–8.5)

## 2018-01-16 ENCOUNTER — Ambulatory Visit
Admission: EM | Admit: 2018-01-16 | Discharge: 2018-01-16 | Disposition: A | Discharge status: Home / Self Care | Payer: Medicare Other | Attending: Family Medicine | Admitting: Family Medicine

## 2018-01-16 ENCOUNTER — Other Ambulatory Visit: Payer: Self-pay

## 2018-01-16 ENCOUNTER — Ambulatory Visit (INDEPENDENT_AMBULATORY_CARE_PROVIDER_SITE_OTHER): Payer: Medicare Other

## 2018-01-16 ENCOUNTER — Encounter: Payer: Self-pay | Admitting: Emergency Medicine

## 2018-01-16 DIAGNOSIS — E890 Postprocedural hypothyroidism: Secondary | ICD-10-CM | POA: Diagnosis not present

## 2018-01-16 DIAGNOSIS — Z7951 Long term (current) use of inhaled steroids: Secondary | ICD-10-CM | POA: Insufficient documentation

## 2018-01-16 DIAGNOSIS — E1122 Type 2 diabetes mellitus with diabetic chronic kidney disease: Secondary | ICD-10-CM | POA: Diagnosis not present

## 2018-01-16 DIAGNOSIS — E1142 Type 2 diabetes mellitus with diabetic polyneuropathy: Secondary | ICD-10-CM | POA: Diagnosis not present

## 2018-01-16 DIAGNOSIS — Z7982 Long term (current) use of aspirin: Secondary | ICD-10-CM | POA: Insufficient documentation

## 2018-01-16 DIAGNOSIS — I129 Hypertensive chronic kidney disease with stage 1 through stage 4 chronic kidney disease, or unspecified chronic kidney disease: Secondary | ICD-10-CM | POA: Diagnosis not present

## 2018-01-16 DIAGNOSIS — J449 Chronic obstructive pulmonary disease, unspecified: Secondary | ICD-10-CM | POA: Insufficient documentation

## 2018-01-16 DIAGNOSIS — N183 Chronic kidney disease, stage 3 (moderate): Secondary | ICD-10-CM | POA: Diagnosis not present

## 2018-01-16 DIAGNOSIS — Z882 Allergy status to sulfonamides status: Secondary | ICD-10-CM | POA: Insufficient documentation

## 2018-01-16 DIAGNOSIS — Z79899 Other long term (current) drug therapy: Secondary | ICD-10-CM | POA: Diagnosis not present

## 2018-01-16 DIAGNOSIS — Z79890 Hormone replacement therapy (postmenopausal): Secondary | ICD-10-CM | POA: Diagnosis not present

## 2018-01-16 DIAGNOSIS — Z833 Family history of diabetes mellitus: Secondary | ICD-10-CM | POA: Insufficient documentation

## 2018-01-16 DIAGNOSIS — N760 Acute vaginitis: Secondary | ICD-10-CM | POA: Diagnosis not present

## 2018-01-16 DIAGNOSIS — Z136 Encounter for screening for cardiovascular disorders: Secondary | ICD-10-CM | POA: Diagnosis not present

## 2018-01-16 DIAGNOSIS — R110 Nausea: Secondary | ICD-10-CM | POA: Diagnosis not present

## 2018-01-16 DIAGNOSIS — Z9841 Cataract extraction status, right eye: Secondary | ICD-10-CM | POA: Diagnosis not present

## 2018-01-16 DIAGNOSIS — R109 Unspecified abdominal pain: Secondary | ICD-10-CM

## 2018-01-16 DIAGNOSIS — Z961 Presence of intraocular lens: Secondary | ICD-10-CM | POA: Diagnosis not present

## 2018-01-16 DIAGNOSIS — Z86010 Personal history of colonic polyps: Secondary | ICD-10-CM | POA: Diagnosis not present

## 2018-01-16 DIAGNOSIS — Z794 Long term (current) use of insulin: Secondary | ICD-10-CM | POA: Diagnosis not present

## 2018-01-16 DIAGNOSIS — K219 Gastro-esophageal reflux disease without esophagitis: Secondary | ICD-10-CM | POA: Insufficient documentation

## 2018-01-16 DIAGNOSIS — Z886 Allergy status to analgesic agent status: Secondary | ICD-10-CM | POA: Diagnosis not present

## 2018-01-16 DIAGNOSIS — K5900 Constipation, unspecified: Secondary | ICD-10-CM | POA: Diagnosis not present

## 2018-01-16 LAB — URINALYSIS, COMPLETE (UACMP) WITH MICROSCOPIC
BACTERIA UA: NONE SEEN
BILIRUBIN URINE: NEGATIVE
GLUCOSE, UA: 500 mg/dL — AB
KETONES UR: NEGATIVE mg/dL
LEUKOCYTES UA: NEGATIVE
Nitrite: NEGATIVE
PH: 5.5 (ref 5.0–8.0)
Protein, ur: 100 mg/dL — AB
Specific Gravity, Urine: 1.025 (ref 1.005–1.030)

## 2018-01-16 LAB — CBC WITH DIFFERENTIAL/PLATELET
Basophils Absolute: 0 10*3/uL (ref 0–0.1)
Basophils Relative: 1 %
Eosinophils Absolute: 0.1 10*3/uL (ref 0–0.7)
Eosinophils Relative: 3 %
HEMATOCRIT: 37 % (ref 35.0–47.0)
HEMOGLOBIN: 12.1 g/dL (ref 12.0–16.0)
LYMPHS ABS: 1 10*3/uL (ref 1.0–3.6)
Lymphocytes Relative: 27 %
MCH: 27.4 pg (ref 26.0–34.0)
MCHC: 32.6 g/dL (ref 32.0–36.0)
MCV: 84 fL (ref 80.0–100.0)
MONOS PCT: 9 %
Monocytes Absolute: 0.3 10*3/uL (ref 0.2–0.9)
NEUTROS ABS: 2.1 10*3/uL (ref 1.4–6.5)
NEUTROS PCT: 60 %
Platelets: 191 10*3/uL (ref 150–440)
RBC: 4.4 MIL/uL (ref 3.80–5.20)
RDW: 15.1 % — ABNORMAL HIGH (ref 11.5–14.5)
WBC: 3.5 10*3/uL — AB (ref 3.6–11.0)

## 2018-01-16 LAB — BASIC METABOLIC PANEL
ANION GAP: 11 (ref 5–15)
BUN: 13 mg/dL (ref 8–23)
CHLORIDE: 106 mmol/L (ref 98–111)
CO2: 25 mmol/L (ref 22–32)
Calcium: 9.2 mg/dL (ref 8.9–10.3)
Creatinine, Ser: 1.01 mg/dL — ABNORMAL HIGH (ref 0.44–1.00)
GFR calc Af Amer: >60 mL/min (ref >60)
GFR, EST NON AFRICAN AMERICAN: 55 mL/min — AB (ref >60)
GLUCOSE: 192 mg/dL — AB (ref 70–99)
POTASSIUM: 4.2 mmol/L (ref 3.5–5.1)
Sodium: 142 mmol/L (ref 135–145)

## 2018-01-16 LAB — LIPASE, BLOOD: LIPASE: 28 U/L (ref 11–51)

## 2018-01-16 MED ORDER — DOCUSATE SODIUM 100 MG PO CAPS: 100.0000 mg | ORAL_CAPSULE | Freq: Two times a day (BID) | ORAL | 0 refills | Status: DC

## 2018-01-16 MED ORDER — FLUCONAZOLE 150 MG PO TABS: 150.0000 mg | ORAL_TABLET | Freq: Every day | ORAL | 0 refills | Status: DC

## 2018-01-16 NOTE — ED Triage Notes (Signed)
Pt c/o epigastric pain and pain under her ribs. Started a while back but recently got worse about a week ago. She would take Prilosec and it would help some and in the last week it has not helped. occasionally she nausea. She feels constipated. No vomiting.

## 2018-01-16 NOTE — ED Provider Notes (Signed)
[facility] ____________________________________________  Time seen: Approximately 9:47 AM  I have reviewed the triage vital signs and the nursing notes.   HISTORY  Chief Complaint Abdominal Pain   HPI Mary Parks is a 67 y.o. female presented for evaluation of approximately 3-4 weeks of constipation with accompanying abdominal discomfort.  States has continued to pass some bowel movements but have been smaller and more firm with accompanying straining.  States last small bowel movement was today, did have a more "good" bowel movement last week.  States constipation is not typical for her.  States discomfort has increased in the last 1 week.  Has not tried any over-the-counter medications for the same complaint.  States abdominal discomfort accompanying the constipation and bloated sensation is in her mid abdomen.  Occasional nausea, no vomiting.  Improved with belching as well as passing gas.  Also improved somewhat with intermittent over-the-counter Prilosec.  History of acid reflux but does not take any regular acid reflux medications.  No accompanying fever.  No others around her with similar.  Has continued to eat and drink well, but reports not as hungry due to the bloated abdominal sensation.  Occasional low back pain.  Denies any accompanying chest pain, shortness of breath, palpitations.  States current pain is mild.  No medications taken today for the same complaints.  Denies other aggravating or alleviating factors.  Reports otherwise feels well.Denies recent sickness. Denies recent antibiotic use.   Kathrine Haddock, NP: PCP   Past Medical History:  Diagnosis Date  . Asthma   . Chest pain   . DM2 (diabetes mellitus, type 2) ([place]) 1987  . Dyspnea    Cath normal coronaries. normal EF. RA 11. PA 37/17 (26) LVEDP 36  . GERD (gastroesophageal reflux disease)   . HTN (hypertension)   . Hyperlipidemia   . Obesity   . Osteoarthritis    knees  . Renal disease   .  Thyroid disease    having thyroid removed 04/14/15  . Vertigo    none recently    Patient Active Problem List   Diagnosis Date Noted  . Hypercholesteremia 06/29/2017  . Special screening for malignant neoplasms, colon   . Benign neoplasm of cecum   . Benign neoplasm of ascending colon   . Benign neoplasm of descending colon   . Allergic rhinitis 09/22/2016  . OA (osteoarthritis) of knee 06/25/2016  . Hypertension 10/03/2015  . Peripheral neuropathy 10/03/2015  . Uncontrolled type 2 diabetes mellitus with chronic kidney disease ([place]) 10/03/2015  . Type 2 diabetes mellitus with diabetic neuropathy, with long-term current use of insulin ([place]) 10/03/2015  . S/P total thyroidectomy 04/14/2015  . Vertigo, intermittent 04/04/2015  . Chronic kidney disease, stage 3 ([place]) 12/17/2014  . Hypertension associated with chronic kidney disease due to type 2 diabetes mellitus ([place]) 12/17/2014  . Diabetes mellitus with chronic kidney disease ([place]) 11/28/2014  . COPD (chronic obstructive pulmonary disease) ([place]) 11/19/2014  . Allergic conjunctivitis 11/19/2014  . UNSPECIFIED ANEMIA 09/25/2008  . DYSPNEA 09/13/2008  . SNORING 09/13/2008    Past Surgical History:  Procedure Laterality Date  . CARDIAC CATHETERIZATION    . CATARACT EXTRACTION W/PHACO Right 03/31/2015   Procedure: CATARACT EXTRACTION PHACO AND INTRAOCULAR LENS PLACEMENT (IOC);  Surgeon: Ronnell Freshwater, MD;  Location: [facility];  Service: Ophthalmology;  Laterality: Right;  DIABETIC - insulin  . COLONOSCOPY WITH PROPOFOL N/A 11/12/2016   Procedure: COLONOSCOPY WITH PROPOFOL;  Surgeon: [name], MD;  Location: [facility];  Service: Endoscopy;  Laterality: N/A;  Diabetic - insulin  . EYE SURGERY    . HERNIA REPAIR     umbilical  . POLYPECTOMY  11/12/2016   Procedure: POLYPECTOMY INTESTINAL;  Surgeon: [name], MD;  Location: [facility];  Service: Endoscopy;;  . THYROIDECTOMY N/A 04/14/2015     Procedure: THYROIDECTOMY;  Surgeon: Margaretha Sheffield, MD;  Location: [facility];  Service: ENT;  Laterality: N/A;     No current facility-administered medications for this encounter.   Current Outpatient Medications:  .  albuterol (PROVENTIL HFA;VENTOLIN HFA) 108 (90 Base) MCG/ACT inhaler, Inhale 2 puffs into the lungs every 6 (six) hours as needed for wheezing or shortness of breath., Disp: 1 Inhaler, Rfl: 2 .  amLODipine (NORVASC) 2.5 MG tablet, Take by mouth., Disp: , Rfl:  .  aspirin EC 81 MG tablet, Take 81 mg by mouth daily., Disp: , Rfl:  .  atorvastatin (LIPITOR) 40 MG tablet, TAKE 1 TABLET BY MOUTH AT BEDTIME, Disp: 90 tablet, Rfl: 0 .  budesonide-formoterol (SYMBICORT) 160-4.5 MCG/ACT inhaler, Inhale 2 puffs into the lungs 2 (two) times daily., Disp: 3 Inhaler, Rfl: 4 .  calcium-vitamin D (OSCAL WITH D) 500-200 MG-UNIT tablet, Take 1 tablet by mouth., Disp: , Rfl:  .  fluticasone (FLONASE) 50 MCG/ACT nasal spray, Place 2 sprays into both nostrils daily., Disp: 16 g, Rfl: 6 .  HUMALOG KWIKPEN 100 UNIT/ML KiwkPen, INJECT 20 UNITS SUBCUTANEOUSLY BEFORE BREAKFAST AND 20 UNITS BEFORE LUNCH AND 30 UNITS BEFORE EVENING MEAL, Disp: 6 pen, Rfl: 12 .  Insulin Glargine (LANTUS SOLOSTAR) 100 UNIT/ML Solostar Pen, Inject 36 Units into the skin 2 (two) times daily., Disp: 12 pen, Rfl: 4 .  levothyroxine (SYNTHROID, LEVOTHROID) 100 MCG tablet, Take 100 mcg by mouth daily., Disp: , Rfl:  .  lisinopril (PRINIVIL,ZESTRIL) 20 MG tablet, TAKE 1 TABLET BY MOUTH ONCE DAILY, Disp: 90 tablet, Rfl: 1 .  LYRICA 100 MG capsule, TAKE 1 CAPSULE BY MOUTH ONCE DAILY, Disp: 30 capsule, Rfl: 5 .  metoprolol tartrate (LOPRESSOR) 25 MG tablet, Take 25 mg by mouth 2 (two) times daily., Disp: , Rfl:  .  omeprazole (PRILOSEC) 20 MG capsule, Take 1 capsule (20 mg total) by mouth daily., Disp: 30 capsule, Rfl: 12 .  ONE TOUCH ULTRA TEST test strip, 1 each by Other route 2 (two) times daily. , Disp: , Rfl:  .  ONETOUCH DELICA  LANCETS 78G MISC, 2 (two) times daily. , Disp: , Rfl:  .  TRULICITY 1.5 NF/6.2ZH SOPN, INJECT 1.5 (1 SYRINGE) INTO THE SKIN ONCE A WEEK, Disp: 1.5 mL, Rfl: 12 .  cetirizine (ZYRTEC) 10 MG tablet, Take 10 mg by mouth daily., Disp: , Rfl:  .  docusate sodium (COLACE) 100 MG capsule, Take 1 capsule (100 mg total) by mouth every 12 (twelve) hours., Disp: 20 capsule, Rfl: 0 .  fluconazole (DIFLUCAN) 150 MG tablet, Take 1 tablet (150 mg total) by mouth daily., Disp: 1 tablet, Rfl: 0  Allergies Celebrex [celecoxib]; Sulfa antibiotics; Sulfisoxazole; and Sulfur  Family History  Problem Relation Age of Onset  . Diabetes Mother   . Diabetes Father   . Diabetes Sister   . Diabetes Brother   . Diabetes Sister   . Diabetes Sister   . Diabetes Maternal Grandmother   . Diabetes Maternal Grandfather   . Diabetes Paternal Grandmother   . Diabetes Paternal Grandfather   . Heart disease Neg Hx   . Coronary artery disease Neg Hx   . Breast cancer  Neg Hx     Social History Social History   Tobacco Use  . Smoking status: Never Smoker  . Smokeless tobacco: Never Used  Substance Use Topics  . Alcohol use: No  . Drug use: No    Review of Systems Constitutional: No fever/chills ENT: No sore throat. Cardiovascular: Denies chest pain. Respiratory: Denies shortness of breath. Gastrointestinal: AS above.  Genitourinary: Negative for dysuria. Musculoskeletal: Negative for current back pain. As above.  Skin: Negative for rash.  ____________________________________________   PHYSICAL EXAM:  VITAL SIGNS: ED Triage Vitals  Enc Vitals Group     BP 01/16/18 0931 (!) 151/78     Pulse Rate 01/16/18 0931 70     Resp 01/16/18 0931 17     Temp 01/16/18 0931 97.9 F (36.6 C)     Temp Source 01/16/18 0931 Oral     SpO2 01/16/18 0931 100 %     Weight 01/16/18 0931 184 lb (83.5 kg)     Height 01/16/18 0931 5\' 4"  (1.626 m)     Head Circumference --      Peak Flow --      Pain Score 01/16/18 0929 6      Pain Loc --      Pain Edu? --      Excl. in [place]? --     Constitutional: Alert and oriented. Well appearing and in no acute distress. ENT      Head: Normocephalic and atraumatic.      Mouth/Throat: Mucous membranes are moist.Oropharynx non-erythematous. Cardiovascular: Normal rate, regular rhythm. Grossly normal heart sounds.  Good peripheral circulation. Respiratory: Normal respiratory effort without tachypnea nor retractions. Breath sounds are clear and equal bilaterally. No wheezes, rales, rhonchi. Gastrointestinal: Normal Bowel sounds.  Mild abdominal distention.  Mild abdominal tenderness epigastric and bilateral lower quadrants.  Abdomen soft, non-guarding.  No CVA tenderness. Musculoskeletal:   No midline cervical, thoracic or lumbar tenderness to palpation.  Steady gait.  No lower extremity edema noted. Neurologic:  Normal speech and language. Speech is normal. No gait instability.  Skin:  Skin is warm, dry and intact. No rash noted. Psychiatric: Mood and affect are normal. Speech and behavior are normal. Patient exhibits appropriate insight and judgment   ___________________________________________   LABS (all labs ordered are listed, but only abnormal results are displayed)  Labs Reviewed  CBC WITH DIFFERENTIAL/PLATELET - Abnormal; Notable for the following components:      Result Value   WBC 3.5 (*)    RDW 15.1 (*)    All other components within normal limits  BASIC METABOLIC PANEL - Abnormal; Notable for the following components:   Glucose, Bld 192 (*)    Creatinine, Ser 1.01 (*)    GFR calc non Af Amer 55 (*)    All other components within normal limits  URINALYSIS, COMPLETE (UACMP) WITH MICROSCOPIC - Abnormal; Notable for the following components:   Color, Urine STRAW (*)    Glucose, UA 500 (*)    Hgb urine dipstick TRACE (*)    Protein, ur 100 (*)    All other components within normal limits  LIPASE, BLOOD    ____________________________________________  EKG  ED ECG REPORT I, Marylene Land, the attending provider, personally viewed and interpreted this ECG.   Date: 01/16/2018  EKG Time: 1010  Rate: 69  Rhythm: normal sinus rhythm,  Axis: normal  Intervals:none  ST&T Change: none noted.  Similar to previous EKG from 03/25/2015.  ____________________________________________  RADIOLOGY  Dg Abd 2 Views  Result Date: 01/16/2018 CLINICAL DATA:  Acute generalized abdominal pain.  Constipation. EXAM: ABDOMEN - 2 VIEW COMPARISON:  None. FINDINGS: No abnormal bowel dilatation is noted. Large amount of stool seen throughout the colon. There is no evidence of free air. Phleboliths are noted in the pelvis. IMPRESSION: Large stool burden is noted.  No abnormal bowel dilatation is noted. Electronically Signed   By: Marijo Conception, M.D.   On: 01/16/2018 10:48   ____________________________________________   PROCEDURES Procedures    INITIAL IMPRESSION / ASSESSMENT AND PLAN / ED COURSE  Pertinent labs & imaging results that were available during my care of the patient were reviewed by me and considered in my medical decision making (see chart for details).  Well-appearing patient.  No acute distress.  Changes positions quickly in room.  Constipation with abdominal pain discomfort for the last 3 weeks.  Results reviewed and discussed in detail with patient.  Urinalysis negative for bacteria, did show budding yeast, and further discussion with patient, patient reports she does feel like she also has a vaginal yeast infection stating some whitish vaginal discharge and vaginal irritation.  Will treat with single Diflucan.  Labs reviewed, similar to patient chronic.  Abdominal x-ray per radiologist as above, large stool burden noted, no abdominal dilation.  Will treat with the Diflucan, twice daily Colace and patient states that she will get over-the-counter Metamucil or MiraLAX.  Discussed using daily  until regular bowel movements achieved.  Discussed strict follow-up and return parameters, close follow-up with primary care encouraged. Discussed indication, risks and benefits of medications with patient.  Discussed follow up and return parameters including no resolution or any worsening concerns. Patient verbalized understanding and agreed to plan.   ____________________________________________   FINAL CLINICAL IMPRESSION(S) / ED DIAGNOSES  Final diagnoses:  Constipation, unspecified constipation type  Abdominal pain, unspecified abdominal location  Acute vaginitis     ED Discharge Orders         Ordered    docusate sodium (COLACE) 100 MG capsule  Every 12 hours     01/16/18 1059    fluconazole (DIFLUCAN) 150 MG tablet  Daily     01/16/18 1101           Note: This dictation was prepared with Dragon dictation along with smaller phrase technology. Any transcriptional errors that result from this process are unintentional.         Marylene Land, NP 01/16/18 1106

## 2018-01-16 NOTE — Discharge Instructions (Addendum)
Take medication as prescribed.  Use over-the-counter Metamucil or MiraLAX daily until regular bowel movements achieved as discussed. Drink plenty of water.  Monitor blood sugars.  Follow up with your primary care physician this week as needed. Return to Urgent care for new or worsening concerns.

## 2018-01-30 ENCOUNTER — Other Ambulatory Visit: Payer: Self-pay

## 2018-02-10 ENCOUNTER — Other Ambulatory Visit: Payer: Self-pay | Admitting: Family Medicine

## 2018-02-10 DIAGNOSIS — N289 Disorder of kidney and ureter, unspecified: Secondary | ICD-10-CM | POA: Diagnosis not present

## 2018-02-10 DIAGNOSIS — E785 Hyperlipidemia, unspecified: Secondary | ICD-10-CM | POA: Diagnosis not present

## 2018-02-10 DIAGNOSIS — E1165 Type 2 diabetes mellitus with hyperglycemia: Secondary | ICD-10-CM | POA: Diagnosis not present

## 2018-02-10 DIAGNOSIS — E042 Nontoxic multinodular goiter: Secondary | ICD-10-CM | POA: Diagnosis not present

## 2018-02-10 DIAGNOSIS — E039 Hypothyroidism, unspecified: Secondary | ICD-10-CM | POA: Diagnosis not present

## 2018-02-10 DIAGNOSIS — I10 Essential (primary) hypertension: Secondary | ICD-10-CM | POA: Diagnosis not present

## 2018-02-10 DIAGNOSIS — E559 Vitamin D deficiency, unspecified: Secondary | ICD-10-CM | POA: Diagnosis not present

## 2018-03-03 ENCOUNTER — Telehealth: Payer: Self-pay

## 2018-03-03 NOTE — Telephone Encounter (Signed)
On review it looks like she runs on the higher side.  Would benefit from coming in to see me and then we can perform A1C at that time.  If this is difficult for her to do then we can check A1C on January visit.

## 2018-03-03 NOTE — Telephone Encounter (Signed)
Called and spoke with patient, she was seen by [place] a couple of weeks ago, the wrong A1C was ordered, so it was not done and it still needs to be collected.  The insurance company or someone has contacted the patient to let her know that her A1C is not up to date.  Can the patient come in just for a lab  Visit to have  the A1C checked, can she wait until January at her next appt or does she need to schedule a follow visit with you to have this done?   Copied from [place] [phone]. Topic: General - Other >> Mar 03, 2018 12:10 PM Keene Breath wrote: Reason for CRM: Patient called to request a call back from nurse regarding scheduling for her A1C.  Please advise.  CB# [phone]

## 2018-03-06 NOTE — Telephone Encounter (Signed)
Appointment scheduled.

## 2018-03-07 ENCOUNTER — Encounter: Payer: Self-pay | Admitting: Nurse Practitioner

## 2018-03-07 ENCOUNTER — Ambulatory Visit (INDEPENDENT_AMBULATORY_CARE_PROVIDER_SITE_OTHER): Payer: Medicare Other | Admitting: Nurse Practitioner

## 2018-03-07 DIAGNOSIS — N183 Chronic kidney disease, stage 3 (moderate): Secondary | ICD-10-CM | POA: Diagnosis not present

## 2018-03-07 DIAGNOSIS — E1140 Type 2 diabetes mellitus with diabetic neuropathy, unspecified: Secondary | ICD-10-CM | POA: Diagnosis not present

## 2018-03-07 DIAGNOSIS — E0822 Diabetes mellitus due to underlying condition with diabetic chronic kidney disease: Secondary | ICD-10-CM

## 2018-03-07 DIAGNOSIS — Z794 Long term (current) use of insulin: Secondary | ICD-10-CM

## 2018-03-07 LAB — BAYER DCA HB A1C WAIVED: HB A1C (BAYER DCA - WAIVED): 8.7 % — ABNORMAL HIGH (ref <7.0)

## 2018-03-07 MED ORDER — DULAGLUTIDE 1.5 MG/0.5ML ~~LOC~~ SOAJ: SUBCUTANEOUS | 12 refills | Status: DC

## 2018-03-07 NOTE — Assessment & Plan Note (Addendum)
Chronic, ongoing with poor control.  Followed by endocrinology with next appointment January.  Here for A1C today, which is 8.7%; previous 8.5%.  Pt endorses poor diet over past months.  Encouraged focus on diet, small/gradual changes and will recheck A1C in 3 months with Endo.  Collaborate with Endo and review recommendations.

## 2018-03-07 NOTE — Patient Instructions (Signed)
Diabetes Mellitus and Nutrition When you have diabetes (diabetes mellitus), it is very important to have healthy eating habits because your blood sugar (glucose) levels are greatly affected by what you eat and drink. Eating healthy foods in the appropriate amounts, at about the same times every day, can help you:  Control your blood glucose.  Lower your risk of heart disease.  Improve your blood pressure.  Reach or maintain a healthy weight.  Every person with diabetes is different, and each person has different needs for a meal plan. Your health care provider may recommend that you work with a diet and nutrition specialist (dietitian) to make a meal plan that is best for you. Your meal plan may vary depending on factors such as:  The calories you need.  The medicines you take.  Your weight.  Your blood glucose, blood pressure, and cholesterol levels.  Your activity level.  Other health conditions you have, such as heart or kidney disease.  How do carbohydrates affect me? Carbohydrates affect your blood glucose level more than any other type of food. Eating carbohydrates naturally increases the amount of glucose in your blood. Carbohydrate counting is a method for keeping track of how many carbohydrates you eat. Counting carbohydrates is important to keep your blood glucose at a healthy level, especially if you use insulin or take certain oral diabetes medicines. It is important to know how many carbohydrates you can safely have in each meal. This is different for every person. Your dietitian can help you calculate how many carbohydrates you should have at each meal and for snack. Foods that contain carbohydrates include:  Bread, cereal, rice, pasta, and crackers.  Potatoes and corn.  Peas, beans, and lentils.  Milk and yogurt.  Fruit and juice.  Desserts, such as cakes, cookies, ice cream, and candy.  How does alcohol affect me? Alcohol can cause a sudden decrease in blood  glucose (hypoglycemia), especially if you use insulin or take certain oral diabetes medicines. Hypoglycemia can be a life-threatening condition. Symptoms of hypoglycemia (sleepiness, dizziness, and confusion) are similar to symptoms of having too much alcohol. If your health care provider says that alcohol is safe for you, follow these guidelines:  Limit alcohol intake to no more than 1 drink per day for nonpregnant women and 2 drinks per day for men. One drink equals 12 oz of beer, 5 oz of wine, or 1 oz of hard liquor.  Do not drink on an empty stomach.  Keep yourself hydrated with water, diet soda, or unsweetened iced tea.  Keep in mind that regular soda, juice, and other mixers may contain a lot of sugar and must be counted as carbohydrates.  What are tips for following this plan? Reading food labels  Start by checking the serving size on the label. The amount of calories, carbohydrates, fats, and other nutrients listed on the label are based on one serving of the food. Many foods contain more than one serving per package.  Check the total grams (g) of carbohydrates in one serving. You can calculate the number of servings of carbohydrates in one serving by dividing the total carbohydrates by 15. For example, if a food has 30 g of total carbohydrates, it would be equal to 2 servings of carbohydrates.  Check the number of grams (g) of saturated and trans fats in one serving. Choose foods that have low or no amount of these fats.  Check the number of milligrams (mg) of sodium in one serving. Most people   should limit total sodium intake to less than 2,300 mg per day.  Always check the nutrition information of foods labeled as "low-fat" or "nonfat". These foods may be higher in added sugar or refined carbohydrates and should be avoided.  Talk to your dietitian to identify your daily goals for nutrients listed on the label. Shopping  Avoid buying canned, premade, or processed foods. These  foods tend to be high in fat, sodium, and added sugar.  Shop around the outside edge of the grocery store. This includes fresh fruits and vegetables, bulk grains, fresh meats, and fresh dairy. Cooking  Use low-heat cooking methods, such as baking, instead of high-heat cooking methods like deep frying.  Cook using healthy oils, such as olive, canola, or sunflower oil.  Avoid cooking with butter, cream, or high-fat meats. Meal planning  Eat meals and snacks regularly, preferably at the same times every day. Avoid going long periods of time without eating.  Eat foods high in fiber, such as fresh fruits, vegetables, beans, and whole grains. Talk to your dietitian about how many servings of carbohydrates you can eat at each meal.  Eat 4-6 ounces of lean protein each day, such as lean meat, chicken, fish, eggs, or tofu. 1 ounce is equal to 1 ounce of meat, chicken, or fish, 1 egg, or 1/4 cup of tofu.  Eat some foods each day that contain healthy fats, such as avocado, nuts, seeds, and fish. Lifestyle   Check your blood glucose regularly.  Exercise at least 30 minutes 5 or more days each week, or as told by your health care provider.  Take medicines as told by your health care provider.  Do not use any products that contain nicotine or tobacco, such as cigarettes and e-cigarettes. If you need help quitting, ask your health care provider.  Work with a counselor or diabetes educator to identify strategies to manage stress and any emotional and social challenges. What are some questions to ask my health care provider?  Do I need to meet with a diabetes educator?  Do I need to meet with a dietitian?  What number can I call if I have questions?  When are the best times to check my blood glucose? Where to find more information:  American Diabetes Association: diabetes.org/food-and-fitness/food  Academy of Nutrition and Dietetics:  www.eatright.org/resources/health/diseases-and-conditions/diabetes  National Institute of Diabetes and Digestive and Kidney Diseases (NIH): www.niddk.nih.gov/health-information/diabetes/overview/diet-eating-physical-activity Summary  A healthy meal plan will help you control your blood glucose and maintain a healthy lifestyle.  Working with a diet and nutrition specialist (dietitian) can help you make a meal plan that is best for you.  Keep in mind that carbohydrates and alcohol have immediate effects on your blood glucose levels. It is important to count carbohydrates and to use alcohol carefully. This information is not intended to replace advice given to you by your health care provider. Make sure you discuss any questions you have with your health care provider. Document Released: 02/04/2005 Document Revised: 06/14/2016 Document Reviewed: 06/14/2016 Elsevier Interactive Patient Education  2018 [employer]  

## 2018-03-07 NOTE — Progress Notes (Signed)
BP 134/80 (BP Location: Left Arm, Patient Position: Sitting)   Pulse 77   Temp 97.8 F (36.6 C)   Wt 180 lb 6 oz (81.8 kg)   LMP  (LMP Unknown)   SpO2 99%   BMI 30.96 kg/m    Subjective:    Patient ID: Mary Parks, female    DOB: Dec 31, 1950, 67 y.o.   MRN: [id number]  HPI: Mary Parks is a 67 y.o. female presents for T2DM f/u.  Chief Complaint  Patient presents with  . Diabetes   DIABETES Reports she is here today for A1C, which was not done last visit. Hypoglycemic episodes:no Polydipsia/polyuria: no Visual disturbance: no Chest pain: no Paresthesias: yes Glucose Monitoring: yes  Accucheck frequency: TID  Fasting glucose: 80-120  Post prandial:   Evening: 200 range in evening before dinner  Before meals: 180 range before lunch  Taking Insulin?: yes  Long acting insulin: Lantus and Humalog  Short acting insulin: Blood Pressure Monitoring: daily Retinal Examination: Up to Date Foot Exam: Up to Date Diabetic Education: Completed Pneumovax: Up to Date Influenza: Up to Date Aspirin: yes  Endocrinology sees patient and last visit with provider A1C was 8.5%, has follow-up in January with Endo.  She endorses she does "not always" followed diabetic diet and enjoys "sweet food" + has occasional soda.  Has been seen by dietician and inquired as to what foods she can eat that "help blood sugar go down".  Discussed with her that it is more about what foods she can avoid that cause increased blood sugar.  Discussed with her paying attention to food labels and printed up education for her to take home with her.    Relevant past medical, surgical, family and social history reviewed and updated as indicated. Interim medical history since our last visit reviewed. Allergies and medications reviewed and updated.  Review of Systems  Constitutional: Negative for activity change, fatigue and fever.  Respiratory: Negative for chest tightness, shortness of breath and wheezing.     Cardiovascular: Negative for chest pain, palpitations and leg swelling.  Gastrointestinal: Negative for abdominal distention and abdominal pain.  Endocrine: Negative for polydipsia, polyphagia and polyuria.  Neurological: Negative for dizziness, tremors, weakness and headaches.    Per HPI unless specifically indicated above     Objective:    BP 134/80 (BP Location: Left Arm, Patient Position: Sitting)   Pulse 77   Temp 97.8 F (36.6 C)   Wt 180 lb 6 oz (81.8 kg)   LMP  (LMP Unknown)   SpO2 99%   BMI 30.96 kg/m   Wt Readings from Last 3 Encounters:  03/07/18 180 lb 6 oz (81.8 kg)  01/16/18 184 lb (83.5 kg)  12/27/17 182 lb (82.6 kg)    Physical Exam  Constitutional: She is oriented to person, place, and time. She appears well-developed and well-nourished.  HENT:  Head: Normocephalic and atraumatic.  Eyes: Pupils are equal, round, and reactive to light.  Cardiovascular: Normal rate, regular rhythm and normal heart sounds.  Pulmonary/Chest: Effort normal and breath sounds normal.  Abdominal: Soft. Bowel sounds are normal.  Neurological: She is alert and oriented to person, place, and time.  Skin: Skin is warm and dry.  Psychiatric: She has a normal mood and affect. Her behavior is normal.     Results for orders placed or performed during the hospital encounter of 01/16/18  CBC with Differential  Result Value Ref Range   WBC 3.5 (L) 3.6 - 11.0 K/uL  RBC 4.40 3.80 - 5.20 MIL/uL   Hemoglobin 12.1 12.0 - 16.0 g/dL   HCT 37.0 35.0 - 47.0 %   MCV 84.0 80.0 - 100.0 fL   MCH 27.4 26.0 - 34.0 pg   MCHC 32.6 32.0 - 36.0 g/dL   RDW 15.1 (H) 11.5 - 14.5 %   Platelets 191 150 - 440 K/uL   Neutrophils Relative % 60 %   Neutro Abs 2.1 1.4 - 6.5 K/uL   Lymphocytes Relative 27 %   Lymphs Abs 1.0 1.0 - 3.6 K/uL   Monocytes Relative 9 %   Monocytes Absolute 0.3 0.2 - 0.9 K/uL   Eosinophils Relative 3 %   Eosinophils Absolute 0.1 0 - 0.7 K/uL   Basophils Relative 1 %    Basophils Absolute 0.0 0 - 0.1 K/uL  Lipase, blood  Result Value Ref Range   Lipase 28 11 - 51 U/L  Basic metabolic panel  Result Value Ref Range   Sodium 142 135 - 145 mmol/L   Potassium 4.2 3.5 - 5.1 mmol/L   Chloride 106 98 - 111 mmol/L   CO2 25 22 - 32 mmol/L   Glucose, Bld 192 (H) 70 - 99 mg/dL   BUN 13 8 - 23 mg/dL   Creatinine, Ser 1.01 (H) 0.44 - 1.00 mg/dL   Calcium 9.2 8.9 - 10.3 mg/dL   GFR calc non Af Amer 55 (L) >60 mL/min   GFR calc Af Amer >60 >60 mL/min   Anion gap 11 5 - 15  Urinalysis, Complete w Microscopic  Result Value Ref Range   Color, Urine STRAW (A) YELLOW   APPearance CLEAR CLEAR   Specific Gravity, Urine 1.025 1.005 - 1.030   pH 5.5 5.0 - 8.0   Glucose, UA 500 (A) NEGATIVE mg/dL   Hgb urine dipstick TRACE (A) NEGATIVE   Bilirubin Urine NEGATIVE NEGATIVE   Ketones, ur NEGATIVE NEGATIVE mg/dL   Protein, ur 100 (A) NEGATIVE mg/dL   Nitrite NEGATIVE NEGATIVE   Leukocytes, UA NEGATIVE NEGATIVE   Squamous Epithelial / LPF 0-5 0 - 5   WBC, UA 0-5 0 - 5 WBC/hpf   RBC / HPF 0-5 0 - 5 RBC/hpf   Bacteria, UA NONE SEEN NONE SEEN   Budding Yeast PRESENT       Assessment & Plan:   Problem List Items Addressed This Visit      Endocrine   Diabetes mellitus with chronic kidney disease (HCC)    Chronic, ongoing with poor control.  Followed by endocrinology with next appointment January.  Here for A1C today, which is 8.7%; previous 8.5%.  Pt endorses poor diet over past months.  Encouraged focus on diet, small/gradual changes and will recheck A1C in 3 months with Endo.  Collaborate with Endo and review recommendations.      Relevant Medications   Dulaglutide (TRULICITY) 1.5 NK/[id number] SOPN   Other Relevant Orders   Bayer DCA Hb A1c Waived (STAT)   Type 2 diabetes mellitus with diabetic neuropathy, with long-term current use of insulin (HCC)    Chronic, ongoing with poor control.  Followed by endocrinology with next appointment January.  Here for A1C today,  which is 8.7%; previous 8.5%.  Pt endorses poor diet over past months.  Encouraged focus on diet, small/gradual changes and will recheck A1C in 3 months with Endo.  Collaborate with Endo and review recommendations.      Relevant Medications   Dulaglutide (TRULICITY) 1.5 QB/3.4LP SOPN       Follow  up plan: Return in about 3 months (around 06/07/2018), or if symptoms worsen or fail to improve, for T2DM.

## 2018-05-15 ENCOUNTER — Other Ambulatory Visit: Payer: Self-pay | Admitting: Family Medicine

## 2018-05-28 ENCOUNTER — Encounter: Payer: Self-pay | Admitting: Nurse Practitioner

## 2018-05-29 ENCOUNTER — Encounter: Payer: Self-pay | Admitting: Nurse Practitioner

## 2018-05-29 ENCOUNTER — Ambulatory Visit (INDEPENDENT_AMBULATORY_CARE_PROVIDER_SITE_OTHER): Payer: Medicare Other | Admitting: Nurse Practitioner

## 2018-05-29 ENCOUNTER — Other Ambulatory Visit: Payer: Self-pay

## 2018-05-29 VITALS — BP 134/80 | HR 80 | Temp 98.3°F | Ht 64.5 in | Wt 181.0 lb

## 2018-05-29 DIAGNOSIS — E1169 Type 2 diabetes mellitus with other specified complication: Secondary | ICD-10-CM | POA: Diagnosis not present

## 2018-05-29 DIAGNOSIS — E538 Deficiency of other specified B group vitamins: Secondary | ICD-10-CM

## 2018-05-29 DIAGNOSIS — E785 Hyperlipidemia, unspecified: Secondary | ICD-10-CM

## 2018-05-29 DIAGNOSIS — I129 Hypertensive chronic kidney disease with stage 1 through stage 4 chronic kidney disease, or unspecified chronic kidney disease: Secondary | ICD-10-CM | POA: Diagnosis not present

## 2018-05-29 DIAGNOSIS — E1122 Type 2 diabetes mellitus with diabetic chronic kidney disease: Secondary | ICD-10-CM | POA: Diagnosis not present

## 2018-05-29 DIAGNOSIS — Z794 Long term (current) use of insulin: Secondary | ICD-10-CM

## 2018-05-29 DIAGNOSIS — E1140 Type 2 diabetes mellitus with diabetic neuropathy, unspecified: Secondary | ICD-10-CM

## 2018-05-29 MED ORDER — LISINOPRIL 20 MG PO TABS: 20.0000 mg | ORAL_TABLET | Freq: Every day | ORAL | 3 refills | Status: DC

## 2018-05-29 NOTE — Assessment & Plan Note (Signed)
Chronic, ongoing.  Lipid panel at next visit.  Continue current med regimen.

## 2018-05-29 NOTE — Patient Instructions (Signed)
Carbohydrate Counting for Diabetes Mellitus, Adult  Carbohydrate counting is a method of keeping track of how many carbohydrates you eat. Eating carbohydrates naturally increases the amount of sugar (glucose) in the blood. Counting how many carbohydrates you eat helps keep your blood glucose within normal limits, which helps you manage your diabetes (diabetes mellitus). It is important to know how many carbohydrates you can safely have in each meal. This is different for every person. A diet and nutrition specialist (registered dietitian) can help you make a meal plan and calculate how many carbohydrates you should have at each meal and snack. Carbohydrates are found in the following foods:  Grains, such as breads and cereals.  Dried beans and soy products.  Starchy vegetables, such as potatoes, peas, and corn.  Fruit and fruit juices.  Milk and yogurt.  Sweets and snack foods, such as cake, cookies, candy, chips, and soft drinks. How do I count carbohydrates? There are two ways to count carbohydrates in food. You can use either of the methods or a combination of both. Reading "Nutrition Facts" on packaged food The "Nutrition Facts" list is included on the labels of almost all packaged foods and beverages in the [place] It includes:  The serving size.  Information about nutrients in each serving, including the grams (g) of carbohydrate per serving. To use the "Nutrition Facts":  Decide how many servings you will have.  Multiply the number of servings by the number of carbohydrates per serving.  The resulting number is the total amount of carbohydrates that you will be having. Learning standard serving sizes of other foods When you eat carbohydrate foods that are not packaged or do not include "Nutrition Facts" on the label, you need to measure the servings in order to count the amount of carbohydrates:  Measure the foods that you will eat with a food scale or measuring cup, if needed.   Decide how many standard-size servings you will eat.  Multiply the number of servings by 15. Most carbohydrate-rich foods have about 15 g of carbohydrates per serving. ? For example, if you eat 8 oz (170 g) of strawberries, you will have eaten 2 servings and 30 g of carbohydrates (2 servings x 15 g = 30 g).  For foods that have more than one food mixed, such as soups and casseroles, you must count the carbohydrates in each food that is included. The following list contains standard serving sizes of common carbohydrate-rich foods. Each of these servings has about 15 g of carbohydrates:   hamburger bun or  English muffin.   oz (15 mL) syrup.   oz (14 g) jelly.  1 slice of bread.  1 six-inch tortilla.  3 oz (85 g) cooked rice or pasta.  4 oz (113 g) cooked dried beans.  4 oz (113 g) starchy vegetable, such as peas, corn, or potatoes.  4 oz (113 g) hot cereal.  4 oz (113 g) mashed potatoes or  of a large baked potato.  4 oz (113 g) canned or frozen fruit.  4 oz (120 mL) fruit juice.  4-6 crackers.  6 chicken nuggets.  6 oz (170 g) unsweetened dry cereal.  6 oz (170 g) plain fat-free yogurt or yogurt sweetened with artificial sweeteners.  8 oz (240 mL) milk.  8 oz (170 g) fresh fruit or one small piece of fruit.  24 oz (680 g) popped popcorn. Example of carbohydrate counting Sample meal  3 oz (85 g) chicken breast.  6 oz (170 g)   brown rice.  4 oz (113 g) corn.  8 oz (240 mL) milk.  8 oz (170 g) strawberries with sugar-free whipped topping. Carbohydrate calculation 1. Identify the foods that contain carbohydrates: ? Rice. ? Corn. ? Milk. ? Strawberries. 2. Calculate how many servings you have of each food: ? 2 servings rice. ? 1 serving corn. ? 1 serving milk. ? 1 serving strawberries. 3. Multiply each number of servings by 15 g: ? 2 servings rice x 15 g = 30 g. ? 1 serving corn x 15 g = 15 g. ? 1 serving milk x 15 g = 15 g. ? 1 serving  strawberries x 15 g = 15 g. 4. Add together all of the amounts to find the total grams of carbohydrates eaten: ? 30 g + 15 g + 15 g + 15 g = 75 g of carbohydrates total. Summary  Carbohydrate counting is a method of keeping track of how many carbohydrates you eat.  Eating carbohydrates naturally increases the amount of sugar (glucose) in the blood.  Counting how many carbohydrates you eat helps keep your blood glucose within normal limits, which helps you manage your diabetes.  A diet and nutrition specialist (registered dietitian) can help you make a meal plan and calculate how many carbohydrates you should have at each meal and snack. This information is not intended to replace advice given to you by your health care provider. Make sure you discuss any questions you have with your health care provider. Document Released: 05/10/2005 Document Revised: 11/17/2016 Document Reviewed: 10/22/2015 Elsevier Interactive Patient Education  2019 [employer]  

## 2018-05-29 NOTE — Assessment & Plan Note (Signed)
Chronic, ongoing with poor control.  Sees endo 06/12/18.  Continue to collaborate with endocrinology.  Previous A1C 8.7%, next A1C to performed at Dr. Guerry Bruin office.  Recommended continued focus on diet and exercise.  Continue current medication regimen.

## 2018-05-29 NOTE — Assessment & Plan Note (Signed)
Chronic, stable.  Continue current medication regimen.  Lisinopril for kidney protection.  Continue to collaborate with nephrology.

## 2018-05-29 NOTE — Progress Notes (Signed)
BP 134/80   Pulse 80   Temp 98.3 F (36.8 C) (Oral)   Ht 5' 4.5" (1.638 m)   Wt 181 lb (82.1 kg)   LMP  (LMP Unknown)   SpO2 98%   BMI 30.59 kg/m    Subjective:    Patient ID: Mary Parks, female    DOB: [birth year], 68 y.o.   MRN: [id number]  HPI: Mary Parks is a 68 y.o. female presents for T2DM follow-up  Chief Complaint  Patient presents with  . Diabetes    f/u   DIABETES Is followed by Dr. Rosario Jacks, last A1C 8.7 in October.  Has next follow-up 06/12/2018.  Hypoglycemic episodes:no Polydipsia/polyuria: no Visual disturbance: no Chest pain: no Paresthesias: no Glucose Monitoring: yes  Accucheck frequency: BID  Fasting glucose: 80-130  Post prandial:  Evening: 130 or more  Before meals: Taking Insulin?: no  Long acting insulin:  Short acting insulin: Blood Pressure Monitoring: not checking Retinal Examination: Up to Date Foot Exam: Up to Date Pneumovax: Up to Date Influenza: Up to Date Aspirin: no   HYPERTENSION / HYPERLIPIDEMIA Is followed by nephrology at [facility], last saw them in November 2019.   Satisfied with current treatment? yes Duration of hypertension: chronic BP monitoring frequency: not checking BP range:  BP medication side effects: yes Duration of hyperlipidemia: chronic Cholesterol medication side effects: no Cholesterol supplements: none Medication compliance: good compliance Aspirin: no Recent stressors: no Recurrent headaches: no Visual changes: no Palpitations: no Dyspnea: no Chest pain: no Lower extremity edema: no Dizzy/lightheaded: no  HYPOTHYROIDISM Followed by Dr. Rosario Jacks d/t h/o thyroidectomy several years ago.  She reports he does thyroid labs.  She sees him next on 06/12/2018.  Have encouraged her to bring results from visit to next appointment with provider, as provider is not on Epic. Thyroid control status:controlled Satisfied with current treatment? no Medication side effects: no Medication compliance: good  compliance Etiology of hypothyroidism:  Recent dose adjustment:no Fatigue: no Cold intolerance: no Heat intolerance: no Weight gain: no Weight loss: no Constipation: no Diarrhea/loose stools: no Palpitations: no Lower extremity edema: no Anxiety/depressed mood: no  Relevant past medical, surgical, family and social history reviewed and updated as indicated. Interim medical history since our last visit reviewed. Allergies and medications reviewed and updated.  Review of Systems  Constitutional: Negative for activity change, appetite change, diaphoresis, fatigue and fever.  Respiratory: Negative for cough, chest tightness, shortness of breath and wheezing.   Cardiovascular: Negative for chest pain, palpitations and leg swelling.  Gastrointestinal: Negative for abdominal distention, abdominal pain, constipation, diarrhea, nausea and vomiting.  Endocrine: Negative for cold intolerance, heat intolerance, polydipsia, polyphagia and polyuria.  Neurological: Negative for dizziness, syncope, weakness, light-headedness, numbness and headaches.  Psychiatric/Behavioral: Negative.     Per HPI unless specifically indicated above     Objective:    BP 134/80   Pulse 80   Temp 98.3 F (36.8 C) (Oral)   Ht 5' 4.5" (1.638 m)   Wt 181 lb (82.1 kg)   LMP  (LMP Unknown)   SpO2 98%   BMI 30.59 kg/m   Wt Readings from Last 3 Encounters:  05/29/18 181 lb (82.1 kg)  03/07/18 180 lb 6 oz (81.8 kg)  01/16/18 184 lb (83.5 kg)    Physical Exam Vitals signs and nursing note reviewed.  Constitutional:      Appearance: She is well-developed.  HENT:     Head: Normocephalic.  Eyes:     General:  Right eye: No discharge.        Left eye: No discharge.     Conjunctiva/sclera: Conjunctivae normal.     Pupils: Pupils are equal, round, and reactive to light.  Neck:     Musculoskeletal: Normal range of motion and neck supple.     Thyroid: No thyromegaly.     Vascular: No carotid bruit or  JVD.  Cardiovascular:     Rate and Rhythm: Normal rate and regular rhythm.     Heart sounds: Normal heart sounds.  Pulmonary:     Effort: Pulmonary effort is normal.     Breath sounds: Normal breath sounds.  Abdominal:     General: Bowel sounds are normal.     Palpations: Abdomen is soft.  Musculoskeletal:     Right lower leg: No edema.     Left lower leg: No edema.  Lymphadenopathy:     Cervical: No cervical adenopathy.  Skin:    General: Skin is warm and dry.  Neurological:     Mental Status: She is alert and oriented to person, place, and time.  Psychiatric:        Mood and Affect: Mood normal.        Behavior: Behavior normal.        Thought Content: Thought content normal.        Judgment: Judgment normal.     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  Continue current medication regimen.  Lisinopril for kidney protection.  Continue to collaborate with nephrology.        Relevant Medications   lisinopril (PRINIVIL,ZESTRIL) 20 MG tablet   Other Relevant Orders   Comprehensive metabolic panel     Endocrine   Type 2 diabetes mellitus with diabetic neuropathy, with long-term current use of insulin (HCC) - Primary    Chronic, ongoing with poor control.  Sees endo 06/12/18.  Continue to collaborate with endocrinology.  Previous A1C 8.7%, next A1C to performed at Dr. Guerry Bruin office.  Recommended continued focus on diet and exercise.  Continue current medication regimen.      Relevant Medications   lisinopril (PRINIVIL,ZESTRIL) 20 MG tablet   Hyperlipidemia associated with type 2 diabetes mellitus (HCC)    Chronic, ongoing.  Lipid panel  at next visit.  Continue current med regimen.      Relevant Medications   lisinopril (PRINIVIL,ZESTRIL) 20 MG tablet   Other Relevant Orders   Lipid Panel w/o Chol/HDL Ratio    Other Visit Diagnoses    B12 deficiency       History of low level per patient, check B12 level today.   Relevant Orders   Vitamin B12       Follow up plan: Return in about 3 months (around 08/28/2018) for T2DM.

## 2018-05-30 ENCOUNTER — Other Ambulatory Visit: Payer: Self-pay | Admitting: Nurse Practitioner

## 2018-05-30 DIAGNOSIS — N183 Chronic kidney disease, stage 3 unspecified: Secondary | ICD-10-CM

## 2018-05-30 LAB — LIPID PANEL W/O CHOL/HDL RATIO
Cholesterol, Total: 155 mg/dL (ref 100–199)
HDL: 39 mg/dL — AB (ref >39)
LDL Calculated: 91 mg/dL (ref 0–99)
Triglycerides: 124 mg/dL (ref 0–149)
VLDL Cholesterol Cal: 25 mg/dL (ref 5–40)

## 2018-05-30 LAB — COMPREHENSIVE METABOLIC PANEL
ALT: 18 IU/L (ref 0–32)
AST: 17 IU/L (ref 0–40)
Albumin/Globulin Ratio: 1.7 (ref 1.2–2.2)
Albumin: 4.3 g/dL (ref 3.6–4.8)
Alkaline Phosphatase: 99 IU/L (ref 39–117)
BUN/Creatinine Ratio: 15 (ref 12–28)
BUN: 16 mg/dL (ref 8–27)
Bilirubin Total: 0.4 mg/dL (ref 0.0–1.2)
CO2: 23 mmol/L (ref 20–29)
CREATININE: 1.04 mg/dL — AB (ref 0.57–1.00)
Calcium: 9.1 mg/dL (ref 8.7–10.3)
Chloride: 108 mmol/L — ABNORMAL HIGH (ref 96–106)
GFR, EST AFRICAN AMERICAN: 63 mL/min/{1.73_m2} (ref >59)
GFR, EST NON AFRICAN AMERICAN: 55 mL/min/{1.73_m2} — AB (ref >59)
GLUCOSE: 238 mg/dL — AB (ref 65–99)
Globulin, Total: 2.5 g/dL (ref 1.5–4.5)
Potassium: 4.2 mmol/L (ref 3.5–5.2)
Sodium: 147 mmol/L — ABNORMAL HIGH (ref 134–144)
TOTAL PROTEIN: 6.8 g/dL (ref 6.0–8.5)

## 2018-05-30 LAB — VITAMIN B12: Vitamin B-12: 501 pg/mL (ref 232–1245)

## 2018-06-02 ENCOUNTER — Ambulatory Visit
Admission: EM | Admit: 2018-06-02 | Discharge: 2018-06-02 | Disposition: A | Discharge status: Home / Self Care | Payer: Medicare Other | Attending: Family Medicine | Admitting: Family Medicine

## 2018-06-02 ENCOUNTER — Encounter: Payer: Self-pay | Admitting: Emergency Medicine

## 2018-06-02 ENCOUNTER — Other Ambulatory Visit: Payer: Self-pay

## 2018-06-02 DIAGNOSIS — J069 Acute upper respiratory infection, unspecified: Secondary | ICD-10-CM

## 2018-06-02 MED ORDER — IPRATROPIUM BROMIDE 0.06 % NA SOLN: 2.0000 | Freq: Four times a day (QID) | NASAL | 0 refills | Status: DC | PRN

## 2018-06-02 NOTE — Discharge Instructions (Signed)
Medication as prescribed.  Continue daily OTC Antihistamine.  Take care  Dr. Lacinda Axon

## 2018-06-02 NOTE — ED Triage Notes (Signed)
Patient c/o facial pain and pressure and nasal congestion that started 3 days ago. Patient has been taking Mucinex for her symptoms.

## 2018-06-02 NOTE — ED Provider Notes (Signed)
[facility]    CSN: [id number] Arrival date & time: 06/02/18  0844  History   Chief Complaint Chief Complaint  Patient presents with  . Facial Pain  . Nasal Congestion   HPI  68 year old female presents with respiratory symptoms.  3 day history of ear pain, eye pain, nose pain/congestion, sneezing.  No fever.  Patient also reports postnasal drip.  She states that she has wheezed slightly.  No fever.  She does endorse chills.  She has been taking Mucinex without resolution.  No known exacerbating factors.  No known relieving factors.  No other associated symptoms.  No other complaints.  Past Medical History:  Diagnosis Date  . Asthma   . Chest pain   . DM2 (diabetes mellitus, type 2) ([place]) 1987  . Dyspnea    Cath normal coronaries. normal EF. RA 11. PA 37/17 (26) LVEDP 36  . GERD (gastroesophageal reflux disease)   . HTN (hypertension)   . Hyperlipidemia   . Obesity   . Osteoarthritis    knees  . Renal disease   . Thyroid disease    having thyroid removed 04/14/15  . Vertigo    none recently   Patient Active Problem List   Diagnosis Date Noted  . Hyperlipidemia associated with type 2 diabetes mellitus ([place]) 06/29/2017  . Benign neoplasm of cecum   . Benign neoplasm of ascending colon   . Benign neoplasm of descending colon   . Allergic rhinitis 09/22/2016  . OA (osteoarthritis) of knee 06/25/2016  . Type 2 diabetes mellitus with diabetic neuropathy, with long-term current use of insulin ([place]) 10/03/2015  . S/P total thyroidectomy 04/14/2015  . Chronic kidney disease, stage 3 ([place]) 12/17/2014  . Hypertension associated with chronic kidney disease due to type 2 diabetes mellitus ([place]) 12/17/2014  . Diabetes mellitus with chronic kidney disease ([place]) 11/28/2014   Past Surgical History:  Procedure Laterality Date  . CARDIAC CATHETERIZATION    . CATARACT EXTRACTION W/PHACO Right 03/31/2015   Procedure: CATARACT EXTRACTION PHACO AND INTRAOCULAR LENS  PLACEMENT (IOC);  Surgeon: Ronnell Freshwater, MD;  Location: [place];  Service: Ophthalmology;  Laterality: Right;  DIABETIC - insulin  . COLONOSCOPY WITH PROPOFOL N/A 11/12/2016   Procedure: COLONOSCOPY WITH PROPOFOL;  Surgeon: [name], MD;  Location: [place];  Service: Endoscopy;  Laterality: N/A;  Diabetic - insulin  . EYE SURGERY    . HERNIA REPAIR     umbilical  . POLYPECTOMY  11/12/2016   Procedure: POLYPECTOMY INTESTINAL;  Surgeon: [name], MD;  Location: [place];  Service: Endoscopy;;  . THYROIDECTOMY N/A 04/14/2015   Procedure: THYROIDECTOMY;  Surgeon: Margaretha Sheffield, MD;  Location: [facility];  Service: ENT;  Laterality: N/A;   OB History   No obstetric history on file.     Home Medications    Prior to Admission medications   Medication Sig Start Date End Date Taking? Authorizing Provider  albuterol (PROVENTIL HFA;VENTOLIN HFA) 108 (90 Base) MCG/ACT inhaler Inhale 2 puffs into the lungs every 6 (six) hours as needed for wheezing or shortness of breath. 12/24/16  Yes Kathrine Haddock, NP  amLODipine (NORVASC) 2.5 MG tablet Take by mouth. 11/22/17 11/22/18 Yes [provider]  aspirin EC 81 MG tablet Take 81 mg by mouth daily.   Yes [provider]  atorvastatin (LIPITOR) 40 MG tablet TAKE 1 TABLET BY MOUTH AT BEDTIME 05/15/18  Yes Johnson, Megan P, DO  budesonide-formoterol (SYMBICORT) 160-4.5 MCG/ACT inhaler Inhale 2  puffs into the lungs 2 (two) times daily. 12/24/16  Yes Kathrine Haddock, NP  calcium-vitamin D (OSCAL WITH D) 500-200 MG-UNIT tablet Take 1 tablet by mouth.   Yes [provider]  cetirizine (ZYRTEC) 10 MG tablet Take 10 mg by mouth daily.   Yes [provider]  Dulaglutide (TRULICITY) 1.5 MO/2.9UT SOPN INJECT 1.5 (1 SYRINGE) INTO THE SKIN ONCE A WEEK 03/07/18  Yes Cannady, Jolene T, NP  HUMALOG KWIKPEN 100 UNIT/ML KiwkPen INJECT 20 UNITS SUBCUTANEOUSLY BEFORE BREAKFAST AND 20 UNITS BEFORE LUNCH  AND 30 UNITS BEFORE EVENING MEAL 07/21/17  Yes Kathrine Haddock, NP  Insulin Glargine (LANTUS SOLOSTAR) 100 UNIT/ML Solostar Pen Inject 36 Units into the skin 2 (two) times daily. 11/29/17  Yes Kathrine Haddock, NP  levothyroxine (SYNTHROID, LEVOTHROID) 100 MCG tablet Take 100 mcg by mouth daily. 07/02/15  Yes [provider]  lisinopril (PRINIVIL,ZESTRIL) 20 MG tablet Take 1 tablet (20 mg total) by mouth daily. 05/29/18  Yes Cannady, Jolene T, NP  metoprolol tartrate (LOPRESSOR) 25 MG tablet Take 25 mg by mouth 2 (two) times daily. 07/09/16  Yes [provider]  omeprazole (PRILOSEC) 20 MG capsule Take 1 capsule (20 mg total) by mouth daily. 09/11/15  Yes Kathrine Haddock, NP  ONE TOUCH ULTRA TEST test strip 1 each by Other route 2 (two) times daily.  03/15/15  Yes [provider]  Jonetta Speak LANCETS 65Y MISC 2 (two) times daily.  03/15/15  Yes [provider]  ipratropium (ATROVENT) 0.06 % nasal spray Place 2 sprays into both nostrils 4 (four) times daily as needed for rhinitis. 06/02/18   Coral Spikes, DO    Family History Family History  Problem Relation Age of Onset  . Diabetes Mother   . Diabetes Father   . Diabetes Sister   . Diabetes Brother   . Diabetes Sister   . Diabetes Sister   . Diabetes Maternal Grandmother   . Diabetes Maternal Grandfather   . Diabetes Paternal Grandmother   . Diabetes Paternal Grandfather   . Heart disease Neg Hx   . Coronary artery disease Neg Hx   . Breast cancer Neg Hx     Social History Social History   Tobacco Use  . Smoking status: Never Smoker  . Smokeless tobacco: Never Used  Substance Use Topics  . Alcohol use: No  . Drug use: No     Allergies   Celebrex [celecoxib]; Sulfa antibiotics; Sulfisoxazole; and Sulfur   Review of Systems Review of Systems Per HPI  Physical Exam Triage Vital Signs ED Triage Vitals  Enc Vitals Group     BP 06/02/18 0904 115/79     Pulse Rate 06/02/18 0904 100     Resp  06/02/18 0904 18     Temp 06/02/18 0904 98.7 F (37.1 C)     Temp Source 06/02/18 0904 Oral     SpO2 06/02/18 0904 98 %     Weight 06/02/18 0903 180 lb (81.6 kg)     Height 06/02/18 0903 5\' 4"  (1.626 m)     Head Circumference --      Peak Flow --      Pain Score 06/02/18 0903 0     Pain Loc --      Pain Edu? --      Excl. in [place]? --    Updated Vital Signs BP 115/79 (BP Location: Right Arm)   Pulse 100   Temp 98.7 F (37.1 C) (Oral)   Resp 18  Ht 5\' 4"  (1.626 m)   Wt 81.6 kg   LMP  (LMP Unknown)   SpO2 98%   BMI 30.90 kg/m   Visual Acuity Right Eye Distance:   Left Eye Distance:   Bilateral Distance:    Right Eye Near:   Left Eye Near:    Bilateral Near:     Physical Exam Vitals signs and nursing note reviewed.  Constitutional:      General: She is not in acute distress. HENT:     Head: Normocephalic and atraumatic.     Right Ear: Tympanic membrane normal.     Left Ear: Tympanic membrane normal.     Nose: No rhinorrhea.     Mouth/Throat:     Pharynx: Oropharynx is clear. No posterior oropharyngeal erythema.  Eyes:     General:        Right eye: No discharge.        Left eye: No discharge.     Conjunctiva/sclera: Conjunctivae normal.  Neck:     Musculoskeletal: Neck supple.  Cardiovascular:     Rate and Rhythm: Normal rate and regular rhythm.  Pulmonary:     Effort: Pulmonary effort is normal.     Breath sounds: No wheezing, rhonchi or rales.  Lymphadenopathy:     Cervical: No cervical adenopathy.  Neurological:     Mental Status: She is alert.  Psychiatric:        Mood and Affect: Mood normal.        Behavior: Behavior normal.    UC Treatments / Results  Labs (all labs ordered are listed, but only abnormal results are displayed) Labs Reviewed - No data to display  EKG None  Radiology No results found.  Procedures Procedures (including critical care time)  Medications Ordered in UC Medications - No data to display  Initial Impression  / Assessment and Plan / UC Course  I have reviewed the triage vital signs and the nursing notes.  Pertinent labs & imaging results that were available during my care of the patient were reviewed by me and considered in my medical decision making (see chart for details).    68 year old female presents with viral URI.  Treating with Atrovent nasal spray.  Supportive care.  Final Clinical Impressions(s) / UC Diagnoses   Final diagnoses:  URI, acute     Discharge Instructions     Medication as prescribed.  Continue daily OTC Antihistamine.  Take care  Dr. Lacinda Axon    ED Prescriptions    Medication Sig Dispense Auth. Provider   ipratropium (ATROVENT) 0.06 % nasal spray Place 2 sprays into both nostrils 4 (four) times daily as needed for rhinitis. 15 mL Coral Spikes, DO     Controlled Substance Prescriptions [place] Controlled Substance Registry consulted? Not Applicable   Coral Spikes, [place] 06/02/18 0388

## 2018-06-08 ENCOUNTER — Other Ambulatory Visit: Payer: Medicare Other

## 2018-06-08 DIAGNOSIS — N183 Chronic kidney disease, stage 3 unspecified: Secondary | ICD-10-CM

## 2018-06-09 LAB — BASIC METABOLIC PANEL
BUN/Creatinine Ratio: 10 — ABNORMAL LOW (ref 12–28)
BUN: 10 mg/dL (ref 8–27)
CALCIUM: 9.5 mg/dL (ref 8.7–10.3)
CHLORIDE: 103 mmol/L (ref 96–106)
CO2: 24 mmol/L (ref 20–29)
Creatinine, Ser: 0.99 mg/dL (ref 0.57–1.00)
GFR calc Af Amer: 67 mL/min/{1.73_m2} (ref >59)
GFR, EST NON AFRICAN AMERICAN: 58 mL/min/{1.73_m2} — AB (ref >59)
GLUCOSE: 137 mg/dL — AB (ref 65–99)
POTASSIUM: 3.7 mmol/L (ref 3.5–5.2)
SODIUM: 143 mmol/L (ref 134–144)

## 2018-06-16 DIAGNOSIS — E1165 Type 2 diabetes mellitus with hyperglycemia: Secondary | ICD-10-CM | POA: Diagnosis not present

## 2018-06-16 DIAGNOSIS — E785 Hyperlipidemia, unspecified: Secondary | ICD-10-CM | POA: Diagnosis not present

## 2018-06-16 DIAGNOSIS — I10 Essential (primary) hypertension: Secondary | ICD-10-CM | POA: Diagnosis not present

## 2018-06-16 DIAGNOSIS — E049 Nontoxic goiter, unspecified: Secondary | ICD-10-CM | POA: Diagnosis not present

## 2018-06-16 DIAGNOSIS — E559 Vitamin D deficiency, unspecified: Secondary | ICD-10-CM | POA: Diagnosis not present

## 2018-07-12 ENCOUNTER — Other Ambulatory Visit: Payer: Self-pay | Admitting: Unknown Physician Specialty

## 2018-07-12 NOTE — Telephone Encounter (Signed)
Requested Prescriptions  Pending Prescriptions Disp Refills  . LANTUS SOLOSTAR 100 UNIT/ML Solostar Pen [Pharmacy Med Name: Lantus SoloStar 100 UNIT/ML Subcutaneous Solution Pen-injector] 30 mL 3    Sig: INJECT 36 UNITS SUBCUTANEOUSLY TWICE DAILY     Endocrinology:  Diabetes - Insulins Failed - 07/12/2018  1:51 PM      Failed - HBA1C is between 0 and 7.9 and within 180 days    Hemoglobin A1C  Date Value Ref Range Status  01/05/2016 9.7  Final   HB A1C (BAYER DCA - WAIVED)  Date Value Ref Range Status  03/07/2018 8.7 (H) <7.0 % Final    Comment:                                          Diabetic Adult            <7.0                                       Healthy Adult        4.3 - 5.7                                                           (DCCT/NGSP) American Diabetes Association's Summary of Glycemic Recommendations for Adults with Diabetes: Hemoglobin A1c <7.0%. More stringent glycemic goals (A1c <6.0%) may further reduce complications at the cost of increased risk of hypoglycemia.          Passed - Valid encounter within last 6 months    Recent Outpatient Visits          1 month ago Type 2 diabetes mellitus with diabetic neuropathy, with long-term current use of insulin ([place])   [place], [place] T, NP   4 months ago Diabetes mellitus due to underlying condition with stage 3 chronic kidney disease, with long-term current use of insulin ([place])   [place], [place] T, NP   6 months ago Hypertension associated with chronic kidney disease due to type 2 diabetes mellitus [facility])   [place], Adriana M, PA-C   10 months ago Acute bilateral low back pain with bilateral sciatica   [facility] [facility], [place]   1 year ago Acute recurrent maxillary sinusitis   [facility] Kathrine Haddock, NP      Future Appointments            In 1 month Cannady, Barbaraann Faster, NP [employer], [facility]   In 5 months  [employer], [facility]

## 2018-07-27 ENCOUNTER — Other Ambulatory Visit: Payer: Self-pay | Admitting: Nurse Practitioner

## 2018-07-27 DIAGNOSIS — Z1231 Encounter for screening mammogram for malignant neoplasm of breast: Secondary | ICD-10-CM

## 2018-08-11 ENCOUNTER — Other Ambulatory Visit: Payer: Self-pay | Admitting: Unknown Physician Specialty

## 2018-08-21 ENCOUNTER — Ambulatory Visit: Payer: Medicare Other

## 2018-08-24 ENCOUNTER — Other Ambulatory Visit: Payer: Self-pay | Admitting: Nurse Practitioner

## 2018-08-24 DIAGNOSIS — E890 Postprocedural hypothyroidism: Secondary | ICD-10-CM

## 2018-08-24 DIAGNOSIS — E1122 Type 2 diabetes mellitus with diabetic chronic kidney disease: Secondary | ICD-10-CM

## 2018-08-24 DIAGNOSIS — E1140 Type 2 diabetes mellitus with diabetic neuropathy, unspecified: Secondary | ICD-10-CM

## 2018-08-24 DIAGNOSIS — Z794 Long term (current) use of insulin: Principal | ICD-10-CM

## 2018-08-24 DIAGNOSIS — I129 Hypertensive chronic kidney disease with stage 1 through stage 4 chronic kidney disease, or unspecified chronic kidney disease: Secondary | ICD-10-CM

## 2018-08-24 DIAGNOSIS — E559 Vitamin D deficiency, unspecified: Secondary | ICD-10-CM

## 2018-08-24 NOTE — Progress Notes (Signed)
Lab orders only 

## 2018-08-28 ENCOUNTER — Other Ambulatory Visit: Payer: Self-pay

## 2018-08-28 ENCOUNTER — Other Ambulatory Visit: Payer: Medicare Other

## 2018-08-28 DIAGNOSIS — I129 Hypertensive chronic kidney disease with stage 1 through stage 4 chronic kidney disease, or unspecified chronic kidney disease: Secondary | ICD-10-CM

## 2018-08-28 DIAGNOSIS — E1140 Type 2 diabetes mellitus with diabetic neuropathy, unspecified: Secondary | ICD-10-CM

## 2018-08-28 DIAGNOSIS — E559 Vitamin D deficiency, unspecified: Secondary | ICD-10-CM

## 2018-08-28 DIAGNOSIS — Z794 Long term (current) use of insulin: Secondary | ICD-10-CM

## 2018-08-28 DIAGNOSIS — E1122 Type 2 diabetes mellitus with diabetic chronic kidney disease: Secondary | ICD-10-CM | POA: Diagnosis not present

## 2018-08-28 DIAGNOSIS — E890 Postprocedural hypothyroidism: Secondary | ICD-10-CM

## 2018-08-28 DIAGNOSIS — Z9889 Other specified postprocedural states: Secondary | ICD-10-CM

## 2018-08-28 LAB — BAYER DCA HB A1C WAIVED: HB A1C (BAYER DCA - WAIVED): 9.1 % — ABNORMAL HIGH (ref <7.0)

## 2018-08-29 ENCOUNTER — Ambulatory Visit (INDEPENDENT_AMBULATORY_CARE_PROVIDER_SITE_OTHER): Payer: Medicare Other | Admitting: Nurse Practitioner

## 2018-08-29 ENCOUNTER — Encounter: Payer: Self-pay | Admitting: Nurse Practitioner

## 2018-08-29 ENCOUNTER — Telehealth: Payer: Self-pay

## 2018-08-29 DIAGNOSIS — I129 Hypertensive chronic kidney disease with stage 1 through stage 4 chronic kidney disease, or unspecified chronic kidney disease: Secondary | ICD-10-CM

## 2018-08-29 DIAGNOSIS — E1140 Type 2 diabetes mellitus with diabetic neuropathy, unspecified: Secondary | ICD-10-CM

## 2018-08-29 DIAGNOSIS — E1169 Type 2 diabetes mellitus with other specified complication: Secondary | ICD-10-CM

## 2018-08-29 DIAGNOSIS — E1122 Type 2 diabetes mellitus with diabetic chronic kidney disease: Secondary | ICD-10-CM

## 2018-08-29 DIAGNOSIS — Z794 Long term (current) use of insulin: Secondary | ICD-10-CM

## 2018-08-29 DIAGNOSIS — E785 Hyperlipidemia, unspecified: Secondary | ICD-10-CM

## 2018-08-29 DIAGNOSIS — E890 Postprocedural hypothyroidism: Secondary | ICD-10-CM | POA: Diagnosis not present

## 2018-08-29 LAB — CBC WITH DIFFERENTIAL/PLATELET
Basophils Absolute: 0 10*3/uL (ref 0.0–0.2)
Basos: 1 %
EOS (ABSOLUTE): 0.2 10*3/uL (ref 0.0–0.4)
Eos: 5 %
Hematocrit: 37.4 % (ref 34.0–46.6)
Hemoglobin: 12.5 g/dL (ref 11.1–15.9)
Immature Grans (Abs): 0 10*3/uL (ref 0.0–0.1)
Immature Granulocytes: 0 %
Lymphocytes Absolute: 1.1 10*3/uL (ref 0.7–3.1)
Lymphs: 29 %
MCH: 27.6 pg (ref 26.6–33.0)
MCHC: 33.4 g/dL (ref 31.5–35.7)
MCV: 83 fL (ref 79–97)
Monocytes Absolute: 0.3 10*3/uL (ref 0.1–0.9)
Monocytes: 8 %
Neutrophils Absolute: 2.1 10*3/uL (ref 1.4–7.0)
Neutrophils: 57 %
Platelets: 213 10*3/uL (ref 150–450)
RBC: 4.53 x10E6/uL (ref 3.77–5.28)
RDW: 13.8 % (ref 11.7–15.4)
WBC: 3.6 10*3/uL (ref 3.4–10.8)

## 2018-08-29 LAB — BASIC METABOLIC PANEL
BUN/Creatinine Ratio: 15 (ref 12–28)
BUN: 17 mg/dL (ref 8–27)
CO2: 26 mmol/L (ref 20–29)
Calcium: 9.2 mg/dL (ref 8.7–10.3)
Chloride: 105 mmol/L (ref 96–106)
Creatinine, Ser: 1.17 mg/dL — ABNORMAL HIGH (ref 0.57–1.00)
GFR calc Af Amer: 55 mL/min/{1.73_m2} — ABNORMAL LOW (ref >59)
GFR calc non Af Amer: 47 mL/min/{1.73_m2} — ABNORMAL LOW (ref >59)
Glucose: 91 mg/dL (ref 65–99)
Potassium: 4.4 mmol/L (ref 3.5–5.2)
Sodium: 144 mmol/L (ref 134–144)

## 2018-08-29 LAB — TSH: TSH: 0.282 u[IU]/mL — ABNORMAL LOW (ref 0.450–4.500)

## 2018-08-29 LAB — VITAMIN D 25 HYDROXY (VIT D DEFICIENCY, FRACTURES): Vit D, 25-Hydroxy: 32.6 ng/mL (ref 30.0–100.0)

## 2018-08-29 MED ORDER — ALBUTEROL SULFATE HFA 108 (90 BASE) MCG/ACT IN AERS: 2.0000 | INHALATION_SPRAY | Freq: Four times a day (QID) | RESPIRATORY_TRACT | 2 refills | Status: DC | PRN

## 2018-08-29 MED ORDER — FLUTICASONE PROPIONATE 50 MCG/ACT NA SUSP: NASAL | 3 refills | Status: DC

## 2018-08-29 MED ORDER — BUDESONIDE-FORMOTEROL FUMARATE 160-4.5 MCG/ACT IN AERO: 2.0000 | INHALATION_SPRAY | Freq: Two times a day (BID) | RESPIRATORY_TRACT | 4 refills | Status: DC

## 2018-08-29 NOTE — Assessment & Plan Note (Signed)
Chronic, ongoing.  Followed by [facility] nephrology.  Continue current medication regimen.  Lisinopril for kidney protection.  Recommend obtaining new BP cuff and monitoring BP three mornings a week.

## 2018-08-29 NOTE — Assessment & Plan Note (Signed)
Chronic, ongoing with poor control.  Average A1C over past year 8-9.  Followed by Dr. Rosario Jacks for endocrinology and sees him next 09/15/2018.  A1C today 9.1%, will follow-up with patient after endo appointment to see if changes made.  No adjustments to med regimen at this time due to recent low BS in mornings.  Recommend continued focus on diet and exercise regimen.  Consider CCM referral at next visit.

## 2018-08-29 NOTE — Patient Instructions (Signed)
Carbohydrate Counting for Diabetes Mellitus, Adult  Carbohydrate counting is a method of keeping track of how many carbohydrates you eat. Eating carbohydrates naturally increases the amount of sugar (glucose) in the blood. Counting how many carbohydrates you eat helps keep your blood glucose within normal limits, which helps you manage your diabetes (diabetes mellitus). It is important to know how many carbohydrates you can safely have in each meal. This is different for every person. A diet and nutrition specialist (registered dietitian) can help you make a meal plan and calculate how many carbohydrates you should have at each meal and snack. Carbohydrates are found in the following foods:  Grains, such as breads and cereals.  Dried beans and soy products.  Starchy vegetables, such as potatoes, peas, and corn.  Fruit and fruit juices.  Milk and yogurt.  Sweets and snack foods, such as cake, cookies, candy, chips, and soft drinks. How do I count carbohydrates? There are two ways to count carbohydrates in food. You can use either of the methods or a combination of both. Reading "Nutrition Facts" on packaged food The "Nutrition Facts" list is included on the labels of almost all packaged foods and beverages in the [place] It includes:  The serving size.  Information about nutrients in each serving, including the grams (g) of carbohydrate per serving. To use the "Nutrition Facts":  Decide how many servings you will have.  Multiply the number of servings by the number of carbohydrates per serving.  The resulting number is the total amount of carbohydrates that you will be having. Learning standard serving sizes of other foods When you eat carbohydrate foods that are not packaged or do not include "Nutrition Facts" on the label, you need to measure the servings in order to count the amount of carbohydrates:  Measure the foods that you will eat with a food scale or measuring cup, if needed.   Decide how many standard-size servings you will eat.  Multiply the number of servings by 15. Most carbohydrate-rich foods have about 15 g of carbohydrates per serving. ? For example, if you eat 8 oz (170 g) of strawberries, you will have eaten 2 servings and 30 g of carbohydrates (2 servings x 15 g = 30 g).  For foods that have more than one food mixed, such as soups and casseroles, you must count the carbohydrates in each food that is included. The following list contains standard serving sizes of common carbohydrate-rich foods. Each of these servings has about 15 g of carbohydrates:   hamburger bun or  English muffin.   oz (15 mL) syrup.   oz (14 g) jelly.  1 slice of bread.  1 six-inch tortilla.  3 oz (85 g) cooked rice or pasta.  4 oz (113 g) cooked dried beans.  4 oz (113 g) starchy vegetable, such as peas, corn, or potatoes.  4 oz (113 g) hot cereal.  4 oz (113 g) mashed potatoes or  of a large baked potato.  4 oz (113 g) canned or frozen fruit.  4 oz (120 mL) fruit juice.  4-6 crackers.  6 chicken nuggets.  6 oz (170 g) unsweetened dry cereal.  6 oz (170 g) plain fat-free yogurt or yogurt sweetened with artificial sweeteners.  8 oz (240 mL) milk.  8 oz (170 g) fresh fruit or one small piece of fruit.  24 oz (680 g) popped popcorn. Example of carbohydrate counting Sample meal  3 oz (85 g) chicken breast.  6 oz (170 g)   brown rice.  4 oz (113 g) corn.  8 oz (240 mL) milk.  8 oz (170 g) strawberries with sugar-free whipped topping. Carbohydrate calculation 1. Identify the foods that contain carbohydrates: ? Rice. ? Corn. ? Milk. ? Strawberries. 2. Calculate how many servings you have of each food: ? 2 servings rice. ? 1 serving corn. ? 1 serving milk. ? 1 serving strawberries. 3. Multiply each number of servings by 15 g: ? 2 servings rice x 15 g = 30 g. ? 1 serving corn x 15 g = 15 g. ? 1 serving milk x 15 g = 15 g. ? 1 serving  strawberries x 15 g = 15 g. 4. Add together all of the amounts to find the total grams of carbohydrates eaten: ? 30 g + 15 g + 15 g + 15 g = 75 g of carbohydrates total. Summary  Carbohydrate counting is a method of keeping track of how many carbohydrates you eat.  Eating carbohydrates naturally increases the amount of sugar (glucose) in the blood.  Counting how many carbohydrates you eat helps keep your blood glucose within normal limits, which helps you manage your diabetes.  A diet and nutrition specialist (registered dietitian) can help you make a meal plan and calculate how many carbohydrates you should have at each meal and snack. This information is not intended to replace advice given to you by your health care provider. Make sure you discuss any questions you have with your health care provider. Document Released: 05/10/2005 Document Revised: 11/17/2016 Document Reviewed: 10/22/2015 Elsevier Interactive Patient Education  2019 [employer]  

## 2018-08-29 NOTE — Assessment & Plan Note (Signed)
Chronic, ongoing.  Continue current medication regimen.  Lipid panel at next visit.

## 2018-08-29 NOTE — Progress Notes (Addendum)
LMP  (LMP Unknown)    Subjective:    Patient ID: Mary Parks, female    DOB: 1950/10/04, 68 y.o.   MRN: [id number]  HPI: Mary Parks is a 68 y.o. female  Chief Complaint  Patient presents with  . Diabetes  . Results  . Medication Refill    Needs refill for Albuterol Inhaler and Symbicort.     . This visit was completed via telephone due to the restrictions of the COVID-19 pandemic. All issues as above were discussed and addressed but no physical exam was performed. If it was felt that the patient should be evaluated in the office, they were directed there. The patient verbally consented to this visit. Patient was unable to complete an audio/visual visit due to Lack of equipment. Due to the catastrophic nature of the COVID-19 pandemic, this visit was done through audio contact only. . Location of the patient: home . Location of the provider: home . Those involved with this call:  . Provider: [name], DNP . CMA: Merilyn Baba, CMA . Front Desk/Registration: Don Perking  . Time spent on call: 15 minutes on the phone discussing health concerns   HYPERTENSION / HYPERLIPIDEMIA Current medications include Metoprolol, Lisinopril, and Amlodipine for HTN + Lipitor for HLD. Satisfied with current treatment? yes Duration of hypertension: chronic BP monitoring frequency: not checking BP range:  BP medication side effects: no Duration of hyperlipidemia: chronic Cholesterol medication side effects: no Cholesterol supplements: none Medication compliance: good compliance Aspirin: yes Recent stressors: no Recurrent headaches: no Visual changes: no Palpitations: no Dyspnea: no Chest pain: no Lower extremity edema: no Dizzy/lightheaded: no   DIABETES Followed by Dr. Rosario Jacks, last A1C 8.7%.  Last visit 06/12/18 with endo and she reports no changes made.  Currently taking Trulicity and Lantus (36 units twice daily) and Humalog 6 in morning, 10 lunch, and 8 in evening.   Has written down her A1C result from today for Dr. Rosario Jacks to review at appointment 09/15/2018 and this provider will follow-up with her after this visit to see if medication changes made.  On review over past year A1C has remained in 8-9 range.  Pt endorses she does "not always" follow a diabetic diet.   Hypoglycemic episodes:no Polydipsia/polyuria: no Visual disturbance: no Chest pain: no Paresthesias: no Glucose Monitoring: yes  Accucheck frequency: BID  Fasting glucose: Last two days 67-78 in morning (has ate snack before bed and no symptoms she reports), but at baseline it is 130's  Post prandial:  Evening: 210-220  Before meals: Taking Insulin?: yes  Long acting insulin:  Short acting insulin: Blood Pressure Monitoring: not checking, BP cuff is broken Retinal Examination: Up to Date Foot Exam: Up to Date Pneumovax: Up to Date Influenza: Up to Date Aspirin: yes   HYPOTHYROIDISM Followed by Dr. Rosario Jacks d/t h/o thyroidectomy several years ago.  Reports he does thyroid labs. Have encouraged her to bring results from visits to appointments with this provider, as Dr. Rosario Jacks notes not on Epic.  Current Levothyroxine dose 100 MCG daily, which she has been on since her surgery with no changes in dose.  She sees Dr. Rosario Jacks on 09/15/2018, has written down her TSH result from today for him to review and this provider will follow-up with her after this visit to see if TSH recheck or medication changes made.   Thyroid control status:stable Satisfied with current treatment? yes Medication side effects: no Medication compliance: good compliance Etiology of hypothyroidism:  Recent dose adjustment:no Fatigue: no  Cold intolerance: no Heat intolerance: no Weight gain: no Weight loss: no Constipation: no Diarrhea/loose stools: no Palpitations: no Lower extremity edema: no Anxiety/depressed mood: no  Relevant past medical, surgical, family and social history reviewed and updated as indicated.  Interim medical history since our last visit reviewed. Allergies and medications reviewed and updated.  Review of Systems  Constitutional: Negative for activity change, appetite change, diaphoresis, fatigue and fever.  Respiratory: Negative for cough, chest tightness and shortness of breath.   Cardiovascular: Negative for chest pain, palpitations and leg swelling.  Gastrointestinal: Negative for abdominal distention, abdominal pain, constipation, diarrhea, nausea and vomiting.  Endocrine: Negative for cold intolerance, heat intolerance, polydipsia, polyphagia and polyuria.  Neurological: Negative for dizziness, syncope, weakness, light-headedness, numbness and headaches.  Psychiatric/Behavioral: Negative.     Per HPI unless specifically indicated above     Objective:    LMP  (LMP Unknown)   Wt Readings from Last 3 Encounters:  06/02/18 180 lb (81.6 kg)  05/29/18 181 lb (82.1 kg)  03/07/18 180 lb 6 oz (81.8 kg)    Physical Exam   Unable to perform due to telephone visit only.  Results for orders placed or performed in visit on 08/28/18  Vitamin D (25 hydroxy)  Result Value Ref Range   Vit D, 25-Hydroxy 32.6 30.0 - 100.0 ng/mL  Basic Metabolic Panel (BMET)  Result Value Ref Range   Glucose 91 65 - 99 mg/dL   BUN 17 8 - 27 mg/dL   Creatinine, Ser 1.17 (H) 0.57 - 1.00 mg/dL   GFR calc non Af Amer 47 (L) >59 mL/min/1.73   GFR calc Af Amer 55 (L) >59 mL/min/1.73   BUN/Creatinine Ratio 15 12 - 28   Sodium 144 134 - 144 mmol/L   Potassium 4.4 3.5 - 5.2 mmol/L   Chloride 105 96 - 106 mmol/L   CO2 26 20 - 29 mmol/L   Calcium 9.2 8.7 - 10.3 mg/dL  TSH  Result Value Ref Range   TSH 0.282 (L) 0.450 - 4.500 uIU/mL  CBC with Differential/Platelet  Result Value Ref Range   WBC 3.6 3.4 - 10.8 x10E3/uL   RBC 4.53 3.77 - 5.28 x10E6/uL   Hemoglobin 12.5 11.1 - 15.9 g/dL   Hematocrit 37.4 34.0 - 46.6 %   MCV 83 79 - 97 fL   MCH 27.6 26.6 - 33.0 pg   MCHC 33.4 31.5 - 35.7 g/dL    RDW 13.8 11.7 - 15.4 %   Platelets 213 150 - 450 x10E3/uL   Neutrophils 57 Not Estab. %   Lymphs 29 Not Estab. %   Monocytes 8 Not Estab. %   Eos 5 Not Estab. %   Basos 1 Not Estab. %   Neutrophils Absolute 2.1 1.4 - 7.0 x10E3/uL   Lymphocytes Absolute 1.1 0.7 - 3.1 x10E3/uL   Monocytes Absolute 0.3 0.1 - 0.9 x10E3/uL   EOS (ABSOLUTE) 0.2 0.0 - 0.4 x10E3/uL   Basophils Absolute 0.0 0.0 - 0.2 x10E3/uL   Immature Granulocytes 0 Not Estab. %   Immature Grans (Abs) 0.0 0.0 - 0.1 x10E3/uL  Bayer DCA Hb A1c Waived  Result Value Ref Range   HB A1C (BAYER DCA - WAIVED) 9.1 (H) <7.0 %      Assessment & Plan:   Problem List Items Addressed This Visit      Cardiovascular and Mediastinum   Hypertension associated with chronic kidney disease due to type 2 diabetes mellitus ([place]) - Primary    Chronic, ongoing.  Followed  by [facility] nephrology.  Continue current medication regimen.  Lisinopril for kidney protection.  Recommend obtaining new BP cuff and monitoring BP three mornings a week.        Endocrine   Type 2 diabetes mellitus with diabetic neuropathy, with long-term current use of insulin ([facility])    Chronic, ongoing with poor control.  Average A1C over past year 8-9.  Followed by Dr. Rosario Jacks for endocrinology and sees him next 09/15/2018.  A1C today 9.1%, will follow-up with patient after endo appointment to see if changes made.  No adjustments to med regimen at this time due to recent low BS in mornings.  Recommend continued focus on diet and exercise regimen.  Consider CCM referral at next visit.        Hyperlipidemia associated with type 2 diabetes mellitus ([facility])    Chronic, ongoing.  Continue current medication regimen.  Lipid panel at next visit.        Other   S/P total thyroidectomy    Chronic, ongoing.  Recheck TSH in two weeks if not obtained at upcoming endo appointment and adjust medication if continues on lower side.  Follow-up with patient in 2 weeks to see if endo adjustments  made.           I discussed the assessment and treatment plan with the patient. The patient was provided an opportunity to ask questions and all were answered. The patient agreed with the plan and demonstrated an understanding of the instructions.   The patient was advised to call back or seek an in-person evaluation if the symptoms worsen or if the condition fails to improve as anticipated.   I provided 15 minutes of time during this encounter.  Follow up plan: Return in about 3 months (around 11/28/2018) for T2DM, HTN/HLD and Hypothyroid.

## 2018-08-29 NOTE — Assessment & Plan Note (Signed)
Chronic, ongoing.  Recheck TSH in two weeks if not obtained at upcoming endo appointment and adjust medication if continues on lower side.  Follow-up with patient in 2 weeks to see if endo adjustments made.

## 2018-08-29 NOTE — Telephone Encounter (Signed)
Tried calling patient, no answer, LVM for patient to return my call. Needs 3 month F/U from 09/18/2018.

## 2018-08-31 ENCOUNTER — Other Ambulatory Visit: Payer: Self-pay | Admitting: Unknown Physician Specialty

## 2018-08-31 NOTE — Telephone Encounter (Signed)
Requested medication (s) are due for refill today -yes  Requested medication (s) are on the active medication list -yes  Future visit scheduled -no  Last refill: 1 year  Notes to clinic: Patient was seen 08/29/18 and advised per OV note to continue current dosing until endocrine appointment. Rx under [place]- seen by [facility]- can Rx be changed to reflect current provider.  Requested Prescriptions  Pending Prescriptions Disp Refills   insulin lispro (HUMALOG) 100 UNIT/ML KwikPen [Pharmacy Med Name: Insulin Lispro 100 UNIT/ML Subcutaneous Solution Pen-injector] 15 mL 0    Sig: INJECT 20 UNITS SUBCUTANEOUSLY BEFORE BREAKFAST AND LUNCH. 30 UNITS BEFORE EVENING MEAL.     There is no refill protocol information for this order       Requested Prescriptions  Pending Prescriptions Disp Refills   insulin lispro (HUMALOG) 100 UNIT/ML KwikPen [Pharmacy Med Name: Insulin Lispro 100 UNIT/ML Subcutaneous Solution Pen-injector] 15 mL 0    Sig: INJECT 20 UNITS SUBCUTANEOUSLY BEFORE BREAKFAST AND LUNCH. 30 UNITS BEFORE EVENING MEAL.     There is no refill protocol information for this order

## 2018-09-11 NOTE — Telephone Encounter (Signed)
Correction 3 month F/U from 08/29/2018. F/U scheduled.

## 2018-09-15 DIAGNOSIS — I10 Essential (primary) hypertension: Secondary | ICD-10-CM | POA: Diagnosis not present

## 2018-09-15 DIAGNOSIS — E1165 Type 2 diabetes mellitus with hyperglycemia: Secondary | ICD-10-CM | POA: Diagnosis not present

## 2018-09-15 DIAGNOSIS — E039 Hypothyroidism, unspecified: Secondary | ICD-10-CM | POA: Diagnosis not present

## 2018-09-15 DIAGNOSIS — E559 Vitamin D deficiency, unspecified: Secondary | ICD-10-CM | POA: Diagnosis not present

## 2018-09-15 DIAGNOSIS — E785 Hyperlipidemia, unspecified: Secondary | ICD-10-CM | POA: Diagnosis not present

## 2018-09-28 ENCOUNTER — Ambulatory Visit: Payer: Medicare Other

## 2018-10-18 ENCOUNTER — Telehealth: Payer: Self-pay

## 2018-10-24 ENCOUNTER — Ambulatory Visit: Payer: Medicare Other | Admitting: Pharmacist

## 2018-10-24 DIAGNOSIS — Z794 Long term (current) use of insulin: Secondary | ICD-10-CM

## 2018-10-24 DIAGNOSIS — E1140 Type 2 diabetes mellitus with diabetic neuropathy, unspecified: Secondary | ICD-10-CM

## 2018-10-24 NOTE — Chronic Care Management (AMB) (Signed)
Chronic Care Management   Note  10/24/2018 Name: Mary Parks MRN: [id number] DOB: [birth year]   Subjective:  Mary Parks is a 68 y.o. year old female who is a primary care patient of Cannady, Barbaraann Faster, NP. The CCM team was consulted for assistance with chronic disease management and care coordination needs.    Patient referred to CCM services due to elevated A1c. Contacted patient telephonically to discuss program.   Mary Parks was given information about Chronic Care Management services today including:  1. CCM service includes personalized support from designated clinical staff supervised by her physician, including individualized plan of care and coordination with other care providers 2. 24/7 contact phone numbers for assistance for urgent and routine care needs. 3. Service will only be billed when office clinical staff spend 20 minutes or more in a month to coordinate care. 4. Only one practitioner may furnish and bill the service in a calendar month. 5. The patient may stop CCM services at any time (effective at the end of the month) by phone call to the office staff. 6. The patient will be responsible for cost sharing (co-pay) of up to 20% of the service fee (after annual deductible is met).  Patient agreed to services and verbal consent obtained.   Review of patient status, including review of consultants reports, laboratory and other test data, was performed as part of comprehensive evaluation and provision of chronic care management services.   Objective:  Lab Results  Component Value Date   CREATININE 1.17 (H) 08/28/2018   CREATININE 0.99 06/08/2018   CREATININE 1.04 (H) 05/29/2018  eGFR ~ 55 (08/28/2018)  Lab Results  Component Value Date   HGBA1C 9.1 (H) 08/28/2018  Reports 8.2% at endo on 09/15/2018     Component Value Date/Time   CHOL 155 05/29/2018 1142   CHOL 157 07/07/2015 1039   TRIG 124 05/29/2018 1142   TRIG 135 07/07/2015 1039   HDL 39 (L)  05/29/2018 1142   VLDL 27 07/07/2015 1039   [place] 91 05/29/2018 1142    Clinical ASCVD: No  The 10-year ASCVD risk score Mikey Bussing DC Jr., et al., 2013) is: 17.2%   Values used to calculate the score:     Age: 24 years     Sex: Female     Is Non-Hispanic African American: Yes     Diabetic: Yes     Tobacco smoker: No     Systolic Blood Pressure: 295 mmHg     Is BP treated: Yes     HDL Cholesterol: 39 mg/dL     Total Cholesterol: 155 mg/dL    BP Readings from Last 3 Encounters:  06/02/18 115/79  05/29/18 134/80  03/07/18 134/80    Allergies  Allergen Reactions  . Celebrex [Celecoxib]     hallucinations  . Sulfa Antibiotics   . Sulfisoxazole Rash  . Sulfur Itching    Medications Reviewed Today    Reviewed by De Hollingshead, [facility] (Pharmacist) on 10/24/18 at [place] List Status: <None>  Medication Order Taking? Sig Documenting Provider Last Dose Status Informant  albuterol (PROVENTIL HFA;VENTOLIN HFA) 108 (90 Base) MCG/ACT inhaler [id number] Yes Inhale 2 puffs into the lungs every 6 (six) hours as needed for wheezing or shortness of breath. Venita Lick, NP Taking Active            Med Note ([facility], [place]   Tue Oct 24, 2018  3:48 PM) 3 times per week  amLODipine (NORVASC) 2.5 MG  tablet [id number] Yes Take by mouth. [provider] Taking Active   aspirin EC 81 MG tablet [id number] Yes Take 81 mg by mouth daily. [provider] Taking Active   atorvastatin (LIPITOR) 40 MG tablet [id number] Yes TAKE 1 TABLET BY MOUTH AT BEDTIME Wynetta Emery, Megan P, DO Taking Active   budesonide-formoterol (SYMBICORT) 160-4.5 MCG/ACT inhaler [id number] Yes Inhale 2 puffs into the lungs 2 (two) times daily. Venita Lick, NP Taking Active            Med Note [facility], [place]   Tue Oct 24, 2018  3:53 PM) Taking PRN  calcium-vitamin D (OSCAL WITH D) 500-200 MG-UNIT tablet [id number] Yes Take 1 tablet by mouth. [provider] Taking Active   cetirizine (ZYRTEC) 10 MG  tablet [id number] No Take 10 mg by mouth daily. [provider] Not Taking Active   Dulaglutide (TRULICITY) 1.5 EL/3.8BO SOPN [id number] Yes INJECT 1.5 (1 SYRINGE) INTO THE SKIN ONCE A WEEK Cannady, Jolene T, NP Taking Active   fluticasone (FLONASE) 50 MCG/ACT nasal spray [id number] Yes USE 2 SPRAY(S) IN EACH NOSTRIL ONCE DAILY Cannady, Jolene T, NP Taking Active   insulin lispro (HUMALOG) 100 UNIT/ML KwikPen [id number] Yes INJECT 20 UNITS SUBCUTANEOUSLY BEFORE BREAKFAST AND LUNCH. 30 UNITS BEFORE EVENING MEAL. Venita Lick, NP Taking Active            Med Note [facility], [place]   Tue Oct 24, 2018  3:50 PM) 6 units, 10 with lunch, 8 units at night  LANTUS SOLOSTAR 100 UNIT/ML Solostar Pen [id number] Yes INJECT 36 UNITS SUBCUTANEOUSLY TWICE DAILY  Patient taking differently:  40 Units 2 (two) times daily.    [name] T, NP Taking Active   levothyroxine (SYNTHROID, LEVOTHROID) 100 MCG tablet [id number] Yes Take 100 mcg by mouth daily. [provider] Taking Active            Med Note Franchot [place] Jul 07, 2015 10:32 AM) Received from: External Pharmacy Received Sig:   lisinopril (PRINIVIL,ZESTRIL) 20 MG tablet [id number] Yes Take 1 tablet (20 mg total) by mouth daily. [name] T, NP Taking Active   metoprolol tartrate (LOPRESSOR) 25 MG tablet [id number] Yes Take 25 mg by mouth 2 (two) times daily. [provider] Taking Active   omeprazole (PRILOSEC) 20 MG capsule [id number] Yes Take 1 capsule (20 mg total) by mouth daily. Kathrine Haddock, NP Taking Active   ONE TOUCH ULTRA TEST test strip [id number] Yes 1 each by Other route 2 (two) times daily.  [provider] Taking Active            Med Note Shelia Media, Nyra Capes Mar 25, 2015 10:34 AM) Received from: External Pharmacy  ONETOUCH DELICA LANCETS 92E MISC [id number] Yes 2 (two) times daily.  [provider] Taking Active            Med Note Shelia Media, Kennis Carina   Tue Mar 25, 2015 10:34 AM)  Received from: External Pharmacy           Assessment:   Goals Addressed            This Visit's Progress     Patient Stated   . "I want to work on my diabetes"  (pt-stated)       Current Barriers:  . Diabetes is currently being managed by endocrinology, taking Trulicity 1.5 mg weekly, Lantus 40 units BID, and Humolog 6-10 units with meals. She does not remember  all of the other diabetic medications tried in the past, but she does recall experiencing GI upset on metformin before discontinuing the medication; knows that she didn't try extended release metformin . Reports A1c was 8.2% at her last endocrinology visit; checks BG BID, reports FBG levels ranging from 87-150. Patient denies any readings of <70 or any signs/symptoms of hypoglycemia.  . Patient states she would like guidance on how to best manage her diet.  o She reports eating 2-3 meals a day, with lunch being her smallest meal of the day. B o Breakfast: eggs, sausage and grits.  o Lunch: typically tuna and tomato sandwiches along with crackers  o Dinner: lots of meats including pork chops with starches including potatoes and rice. She also incorporates vegetables into her diet. Patient expresses eating a lot of melon-type fruits including watermelon and cantaloupe. She also states that he only drinks diet sodas and has been drinking more water with sugar-free flavoring.  . Patient goes on walks with a group of 4 friends on most days of the week for about an hour.   Pharmacist Clinical Goal(s):   Over the next 30 days, patient will work with PharmD and primary care provider to address needs related to safe and effective medication management  Interventions: . Comprehensive medication review performed.  . Educated patient on some diet modifications she can make to help better control her blood glucose levels. Diet interventions discussed included education on serving sizes, limiting starch intake, increasing vegetable  intake, education on appropriate serving sizes of different fruits, and explaining the plate method. Also encouraged patient to continue walking daily.  . Educated patient on metformin XR and the decreased prevalence of GI upset on this formulation. Also discussed renal/glycemic benefits of SGLT2. Encouraged patient to discuss the addition of metformin XR and/or an SGLT2 inhibitor with endocrinology for additional blood glucose control and to help reduce the total amount of insulin needed.   Patient Self Care Activities:  . Self administers medications as prescribed  Initial goal documentation        Plan: - PharmD will follow up with patient in 3-4 weeks for continued medication management support   Catie Darnelle Maffucci, PharmD Clinical Pharmacist [place] [phone]

## 2018-10-24 NOTE — Patient Instructions (Signed)
Visit Information  Goals Addressed            This Visit's Progress     Patient Stated   . "I want to work on my diabetes"  (pt-stated)       Current Barriers:  . Diabetes is currently being managed by endocrinology, taking Trulicity 1.5 mg weekly, Lantus 40 units BID, and Humolog 6-10 units with meals. She does not remember all of the other diabetic medications tried in the past, but she does recall experiencing GI upset on metformin before discontinuing the medication; knows that she didn't try extended release metformin . Reports A1c was 8.2% at her last endocrinology visit; checks BG BID, reports FBG levels ranging from 87-150. Patient denies any readings of <70 or any signs/symptoms of hypoglycemia.  . Patient states she would like guidance on how to best manage her diet.  o She reports eating 2-3 meals a day, with lunch being her smallest meal of the day. B o Breakfast: eggs, sausage and grits.  o Lunch: typically tuna and tomato sandwiches along with crackers  o Dinner: lots of meats including pork chops with starches including potatoes and rice. She also incorporates vegetables into her diet. Patient expresses eating a lot of melon-type fruits including watermelon and cantaloupe. She also states that he only drinks diet sodas and has been drinking more water with sugar-free flavoring.  . Patient goes on walks with a group of 4 friends on most days of the week for about an hour.   Pharmacist Clinical Goal(s):   Over the next 30 days, patient will work with PharmD and primary care provider to address needs related to safe and effective medication management  Interventions: . Comprehensive medication review performed.  . Educated patient on some diet modifications she can make to help better control her blood glucose levels. Diet interventions discussed included education on serving sizes, limiting starch intake, increasing vegetable intake, education on appropriate serving sizes of  different fruits, and explaining the plate method. Also encouraged patient to continue walking daily.  . Educated patient on metformin XR and the decreased prevalence of GI upset on this formulation. Also discussed renal/glycemic benefits of SGLT2. Encouraged patient to discuss the addition of metformin XR and/or an SGLT2 inhibitor with endocrinology for additional blood glucose control and to help reduce the total amount of insulin needed.   Patient Self Care Activities:  . Self administers medications as prescribed  Initial goal documentation        Mary Parks was given information about Chronic Care Management services today including:  1. CCM service includes personalized support from designated clinical staff supervised by her physician, including individualized plan of care and coordination with other care providers 2. 24/7 contact phone numbers for assistance for urgent and routine care needs. 3. Service will only be billed when office clinical staff spend 20 minutes or more in a month to coordinate care. 4. Only one practitioner may furnish and bill the service in a calendar month. 5. The patient may stop CCM services at any time (effective at the end of the month) by phone call to the office staff. 6. The patient will be responsible for cost sharing (co-pay) of up to 20% of the service fee (after annual deductible is met).  Patient agreed to services and verbal consent obtained.   The patient verbalized understanding of instructions provided today and declined a print copy of patient instruction materials.   Plan: - PharmD will follow up with patient in  3-4 weeks for continued medication management support   Catie Darnelle Maffucci, PharmD Clinical Pharmacist [place] [phone]

## 2018-10-25 ENCOUNTER — Telehealth: Payer: Self-pay

## 2018-11-12 ENCOUNTER — Other Ambulatory Visit: Payer: Self-pay | Admitting: Family Medicine

## 2018-11-14 ENCOUNTER — Ambulatory Visit (INDEPENDENT_AMBULATORY_CARE_PROVIDER_SITE_OTHER): Payer: Medicare Other | Admitting: Pharmacist

## 2018-11-14 DIAGNOSIS — E1140 Type 2 diabetes mellitus with diabetic neuropathy, unspecified: Secondary | ICD-10-CM | POA: Diagnosis not present

## 2018-11-14 DIAGNOSIS — Z794 Long term (current) use of insulin: Secondary | ICD-10-CM | POA: Diagnosis not present

## 2018-11-14 NOTE — Patient Instructions (Signed)
Visit Information  Goals Addressed            This Visit's Progress     Patient Stated   . "I want to work on my diabetes"  (pt-stated)       Current Barriers:  . Diabetes is currently being managed by endocrinology, taking Trulicity 1.5 mg weekly, Lantus 40 units BID, and Humolog 6-10 units with meals. She does recall experiencing GI upset on metformin before discontinuing the medication and has not tried extended release metformin . Reports A1c was 8.2% at her last endocrinology visit; checks BG BID and reports a 30-day FBG average of 119 and bedtime averages of 210 o Patient reports one reading of 57 and states she starts having hypoglycemic symptoms when blood glucose levels are below 80. When this occurs, patient reports taking one glucose tablet and checking levels 30 minutes later to make sure it has increased.  . Patient states she has not made many changes in her diet but has started eating low-fat greek yogurt. Also reports that she typically goes out walking with her friends on most days of the week but has not been able to exercise as much as it has been hot outside lately.  . Patient states that she has been experiencing constipation and inquires if this may be due to the Trulicity. Reports that use of Miralax has helped with her symptoms.   Pharmacist Clinical Goal(s):   Over the next 90 days, patient will work with PharmD and primary care provider to address needs related to safe and effective medication management  Interventions: . Comprehensive medication review performed.   . Educated patient on metformin XR and the decreased prevalence of GI upset on this formulation. Also discussed renal/glycemic benefits of SGLT2. Encouraged patient to discuss the addition of metformin XR and/or an SGLT2 inhibitor with endocrinology at next appointment on 7/24 for additional blood glucose control and to help reduce the total amount of insulin needed.  . Encouraged patient to continue  exercising and make positive dietary changes to help better control her blood glucose levels.  . Discussed "Rule of 15" for treating hypoglycemia - advised to take 3-4 glucose tablets for a total of 15 g carbohydrate, then recheck 15 minutes later and eat a small protein snack once BG >70 . Constipation is likely not due to Trulicity and instructed patient to continue taking Miralax as needed for constipation relief.   Patient Self Care Activities:  . Self administers medications as prescribed  Please see past updates related to this goal by clicking on the "Past Updates" button in the selected goal         The patient verbalized understanding of instructions provided today and declined a print copy of patient instruction materials.   Plan: - Will contact patient in 4-5 weeks to f/u after endocrinology appointment  Catie Darnelle Maffucci, PharmD Clinical Pharmacist [place] [phone]

## 2018-11-14 NOTE — Chronic Care Management (AMB) (Signed)
Chronic Care Management   Follow Up Note   11/14/2018 Name: Mary Parks MRN: [id number] DOB: June 21, 1950  Referred by: Venita Lick, NP Reason for referral : Chronic Care Management (Medication Management )   Mary Parks is a 68 y.o. year old female who is a primary care patient of Cannady, Barbaraann Faster, NP. The CCM team was consulted for assistance with chronic disease management and care coordination needs.    Contacted patient to discuss diabetic control.   Review of patient status, including review of consultants reports, relevant laboratory and other test results, and collaboration with appropriate care team members and the patient's provider was performed as part of comprehensive patient evaluation and provision of chronic care management services.    Goals Addressed            This Visit's Progress     Patient Stated   . "I want to work on my diabetes"  (pt-stated)       Current Barriers:  . Diabetes is currently being managed by endocrinology, taking Trulicity 1.5 mg weekly, Lantus 40 units BID, and Humolog 6-10 units with meals. She does recall experiencing GI upset on metformin before discontinuing the medication and has not tried extended release metformin . Reports A1c was 8.2% at her last endocrinology visit; checks BG BID and reports a 30-day FBG average of 119 and bedtime averages of 210 o Patient reports one reading of 57 and states she starts having hypoglycemic symptoms when blood glucose levels are below 80. When this occurs, patient reports taking one glucose tablet and checking levels 30 minutes later to make sure it has increased.  . Patient states she has not made many changes in her diet but has started eating low-fat greek yogurt. Also reports that she typically goes out walking with her friends on most days of the week but has not been able to exercise as much as it has been hot outside lately.  . Patient states that she has been experiencing  constipation and inquires if this may be due to the Trulicity. Reports that use of Miralax has helped with her symptoms.   Pharmacist Clinical Goal(s):   Over the next 90 days, patient will work with PharmD and primary care provider to address needs related to safe and effective medication management  Interventions: . Comprehensive medication review performed.   . Educated patient on metformin XR and the decreased prevalence of GI upset on this formulation. Also discussed renal/glycemic benefits of SGLT2. Encouraged patient to discuss the addition of metformin XR and/or an SGLT2 inhibitor with endocrinology at next appointment on 7/24 for additional blood glucose control and to help reduce the total amount of insulin needed.  . Encouraged patient to continue exercising and make positive dietary changes to help better control her blood glucose levels.  . Discussed "Rule of 15" for treating hypoglycemia - advised to take 3-4 glucose tablets for a total of 15 g carbohydrate, then recheck 15 minutes later and eat a small protein snack once BG >70 . Constipation is likely not due to Trulicity and instructed patient to continue taking Miralax as needed for constipation relief.   Patient Self Care Activities:  . Self administers medications as prescribed  Please see past updates related to this goal by clicking on the "Past Updates" button in the selected goal          Plan: - Will contact patient in 4-5 weeks to f/u after endocrinology appointment  Catie Darnelle Maffucci, PharmD Clinical  Pharmacist [place] [phone]

## 2018-11-15 ENCOUNTER — Other Ambulatory Visit: Payer: Self-pay | Admitting: Nurse Practitioner

## 2018-11-22 ENCOUNTER — Other Ambulatory Visit: Payer: Self-pay

## 2018-11-22 ENCOUNTER — Ambulatory Visit
Admission: RE | Admit: 2018-11-22 | Discharge: 2018-11-22 | Disposition: A | Discharge status: Home / Self Care | Payer: Medicare Other | Source: Ambulatory Visit | Attending: Nurse Practitioner | Admitting: Nurse Practitioner

## 2018-11-22 DIAGNOSIS — Z1231 Encounter for screening mammogram for malignant neoplasm of breast: Secondary | ICD-10-CM | POA: Insufficient documentation

## 2018-11-27 DIAGNOSIS — Z0100 Encounter for examination of eyes and vision without abnormal findings: Secondary | ICD-10-CM | POA: Diagnosis not present

## 2018-11-27 DIAGNOSIS — E119 Type 2 diabetes mellitus without complications: Secondary | ICD-10-CM | POA: Diagnosis not present

## 2018-11-29 ENCOUNTER — Ambulatory Visit (INDEPENDENT_AMBULATORY_CARE_PROVIDER_SITE_OTHER): Payer: Medicare Other | Admitting: Nurse Practitioner

## 2018-11-29 ENCOUNTER — Encounter: Payer: Self-pay | Admitting: Nurse Practitioner

## 2018-11-29 ENCOUNTER — Other Ambulatory Visit: Payer: Self-pay

## 2018-11-29 VITALS — BP 138/79 | HR 60 | Temp 98.0°F | Wt 181.0 lb

## 2018-11-29 DIAGNOSIS — E0822 Diabetes mellitus due to underlying condition with diabetic chronic kidney disease: Secondary | ICD-10-CM

## 2018-11-29 DIAGNOSIS — E1169 Type 2 diabetes mellitus with other specified complication: Secondary | ICD-10-CM | POA: Diagnosis not present

## 2018-11-29 DIAGNOSIS — E1122 Type 2 diabetes mellitus with diabetic chronic kidney disease: Secondary | ICD-10-CM | POA: Diagnosis not present

## 2018-11-29 DIAGNOSIS — E785 Hyperlipidemia, unspecified: Secondary | ICD-10-CM

## 2018-11-29 DIAGNOSIS — E890 Postprocedural hypothyroidism: Secondary | ICD-10-CM

## 2018-11-29 DIAGNOSIS — N183 Chronic kidney disease, stage 3 (moderate): Secondary | ICD-10-CM

## 2018-11-29 DIAGNOSIS — I129 Hypertensive chronic kidney disease with stage 1 through stage 4 chronic kidney disease, or unspecified chronic kidney disease: Secondary | ICD-10-CM

## 2018-11-29 DIAGNOSIS — J0110 Acute frontal sinusitis, unspecified: Secondary | ICD-10-CM | POA: Diagnosis not present

## 2018-11-29 DIAGNOSIS — Z794 Long term (current) use of insulin: Secondary | ICD-10-CM | POA: Diagnosis not present

## 2018-11-29 DIAGNOSIS — J321 Chronic frontal sinusitis: Secondary | ICD-10-CM | POA: Insufficient documentation

## 2018-11-29 LAB — MICROALBUMIN, URINE WAIVED
Creatinine, Urine Waived: 200 mg/dL (ref 10–300)
Microalb, Ur Waived: 150 mg/L — ABNORMAL HIGH (ref 0–19)

## 2018-11-29 LAB — BAYER DCA HB A1C WAIVED: HB A1C (BAYER DCA - WAIVED): 8.4 % — ABNORMAL HIGH

## 2018-11-29 MED ORDER — AMOXICILLIN-POT CLAVULANATE 875-125 MG PO TABS: 1.0000 | ORAL_TABLET | Freq: Two times a day (BID) | ORAL | 0 refills | Status: AC

## 2018-11-29 MED ORDER — MECLIZINE HCL 12.5 MG PO TABS: 12.5000 mg | ORAL_TABLET | Freq: Three times a day (TID) | ORAL | 0 refills | Status: DC | PRN

## 2018-11-29 NOTE — Assessment & Plan Note (Signed)
Chronic, ongoing followed by Dr. Rosario Jacks with endo.  Continue current medication regimen and adjust as needed.  Last TSH was on lower side, will recheck today and notify patient so she may review with endo at upcoming appointment.

## 2018-11-29 NOTE — Assessment & Plan Note (Signed)
Chronic, stable with BP at goal.  Continue current medication regimen, Lisinopril for kidney protection with diabetes.  CMP today.

## 2018-11-29 NOTE — Assessment & Plan Note (Addendum)
Chronic, ongoing.  A1C today 8.4%.  Continue current medication regimen and collaboration with endocrinology (she is to discuss SGLT and/or Metformin with provider).  CMP today,  Urine micro noting ALB 150 and A:C 30-300, continue Lisinopril for kidney protection.

## 2018-11-29 NOTE — Progress Notes (Signed)
BP 138/79   Pulse 60   Temp 98 F (36.7 C) (Oral)   Wt 181 lb (82.1 kg)   LMP  (LMP Unknown)   SpO2 98%   BMI 31.07 kg/m    Subjective:    Patient ID: Mary Parks, female    DOB: 1951/03/28, 68 y.o.   MRN: [id number]  HPI: Mary Parks is a 68 y.o. female  Chief Complaint  Patient presents with  . Follow-up  . Hypertension  . Diabetes   DIABETES Followed by Dr. Rosario Jacks, last A1C 8.7%.  Last visit 09/15/18 with endo, goes back 12/14/18.  She states A1C was 8.9%, no changes made.  Was recommended prior to upcoming appointment to discuss with endo addition of SGLT and/or Metformin XR, per [facility] pharmacist note review.  Currently taking Trulicity and Lantus (40 units twice daily) and Humalog 6 in morning, 10 lunch, and 8 in evening. Review of chart over past year A1C has remained in 8-9 range.  Pt endorses she has poor diet at home. Hypoglycemic episodes:no Polydipsia/polyuria: no Visual disturbance: no Chest pain: no Paresthesias: no Glucose Monitoring: yes  Accucheck frequency: BID  Fasting glucose: past two days 60-70, 108-130 on average  Post prandial:  Evening: 150-200  Before meals: Taking Insulin?: yes  Long acting insulin: 40 units Lantus BID  Short acting insulin: Humalog 6 in morning, 10 lunch, and 8 in evening Blood Pressure Monitoring: a few times a week Retinal Examination: Up to Date Foot Exam: Up to Date Pneumovax: Up to Date Influenza: Up to Date Aspirin: yes  HYPERTENSION / HYPERLIPIDEMIA Current medications include Metoprolol, Lisinopril, and Amlodipine for HTN + Lipitor for HLD. Satisfied with current treatment? yes Duration of hypertension: chronic BP monitoring frequency: a few times a week BP range: 120-130/80's BP medication side effects: no Duration of hyperlipidemia: chronic Cholesterol medication side effects: no Cholesterol supplements: none Medication compliance: good compliance Aspirin: yes Recent stressors: no Recurrent  headaches: no Visual changes: no Palpitations: no Dyspnea: no Chest pain: no Lower extremity edema: no Dizzy/lightheaded: no   HYPOTHYROIDISM Followed by Dr. Rosario Jacks d/t h/o thyroidectomy several years ago. Reports he does thyroid labs. Current Levothyroxine dose 100 MCG daily, which she has been on since her surgery with no changes in dose.  She saw Dr. Rosario Jacks on 09/15/2018. Thyroid control status:stable Satisfied with current treatment? yes Medication side effects: no Medication compliance: good compliance Etiology of hypothyroidism: thyroidectomy Recent dose adjustment:no Fatigue: no Cold intolerance: no Heat intolerance: no Weight gain: no Weight loss: no Constipation: no Diarrhea/loose stools: no Palpitations: no Lower extremity edema: no Anxiety/depressed mood: no   UPPER RESPIRATORY TRACT INFECTION Having some vertigo with sinus pressure.  Having sinus issues for past few months, but this has become worse over past few days.  Takes Coricidin and is using Flonase. Worst symptom: sinus pressure and eye discomfort Fever: no Cough: yes, just at night with drainage Shortness of breath: no Wheezing: no Chest pain: no Chest tightness: no Chest congestion: no Nasal congestion: yes Runny nose: yes Post nasal drip: yes Sneezing: yes Sore throat: no Swollen glands: no Sinus pressure: yes Headache: yes Face pain: yes Toothache: no Ear pain: none Ear pressure: yes bilateral Eyes red/itching:no Eye drainage/crusting: no  Vomiting: no Rash: no Fatigue: yes Sick contacts: no Strep contacts: no  Context: worse Recurrent sinusitis: no Relief with OTC cold/cough medications: yes  Treatments attempted: cold/sinus and anti-histamine   Relevant past medical, surgical, family and social history reviewed and updated as  indicated. Interim medical history since our last visit reviewed. Allergies and medications reviewed and updated.  Review of Systems  Constitutional:  Negative for activity change, appetite change, diaphoresis, fatigue and fever.  Respiratory: Negative for cough, chest tightness and shortness of breath.   Cardiovascular: Negative for chest pain, palpitations and leg swelling.  Gastrointestinal: Negative for abdominal distention, abdominal pain, constipation, diarrhea, nausea and vomiting.  Endocrine: Negative for cold intolerance, heat intolerance, polydipsia, polyphagia and polyuria.  Neurological: Negative for dizziness, syncope, weakness, light-headedness, numbness and headaches.  Psychiatric/Behavioral: Negative.     Per HPI unless specifically indicated above     Objective:    BP 138/79   Pulse 60   Temp 98 F (36.7 C) (Oral)   Wt 181 lb (82.1 kg)   LMP  (LMP Unknown)   SpO2 98%   BMI 31.07 kg/m   Wt Readings from Last 3 Encounters:  11/29/18 181 lb (82.1 kg)  06/02/18 180 lb (81.6 kg)  05/29/18 181 lb (82.1 kg)    Physical Exam Vitals signs and nursing note reviewed.  Constitutional:      General: She is awake. She is not in acute distress.    Appearance: She is well-developed. She is not ill-appearing.  HENT:     Head: Normocephalic.     Right Ear: Hearing, ear canal and external ear normal. A middle ear effusion is present.     Left Ear: Hearing, ear canal and external ear normal. A middle ear effusion is present.     Nose: Mucosal edema and rhinorrhea present. Rhinorrhea is purulent.     Right Sinus: Frontal sinus tenderness present. No maxillary sinus tenderness.     Left Sinus: Frontal sinus tenderness present. No maxillary sinus tenderness.     Mouth/Throat:     Mouth: Mucous membranes are moist.     Pharynx: Posterior oropharyngeal erythema (mild erythema with cobblestoning) present. No pharyngeal swelling or oropharyngeal exudate.  Eyes:     General: Lids are normal.        Right eye: No discharge.        Left eye: No discharge.     Conjunctiva/sclera: Conjunctivae normal.     Pupils: Pupils are equal,  round, and reactive to light.  Neck:     Musculoskeletal: Normal range of motion and neck supple.     Thyroid: No thyromegaly.     Vascular: No carotid bruit or JVD.  Cardiovascular:     Rate and Rhythm: Normal rate and regular rhythm.     Heart sounds: Normal heart sounds. No murmur. No gallop.   Pulmonary:     Effort: Pulmonary effort is normal. No accessory muscle usage or respiratory distress.     Breath sounds: Normal breath sounds.  Abdominal:     General: Bowel sounds are normal.     Palpations: Abdomen is soft. There is no hepatomegaly or splenomegaly.  Musculoskeletal:     Right lower leg: No edema.     Left lower leg: No edema.  Lymphadenopathy:     Cervical: No cervical adenopathy.  Skin:    General: Skin is warm and dry.  Neurological:     Mental Status: She is alert and oriented to person, place, and time.  Psychiatric:        Attention and Perception: Attention normal.        Mood and Affect: Mood normal.        Speech: Speech normal.        Behavior: Behavior  normal. Behavior is cooperative.        Thought Content: Thought content normal.        Judgment: Judgment normal.     Results for orders placed or performed in visit on 08/28/18  Vitamin D (25 hydroxy)  Result Value Ref Range   Vit D, 25-Hydroxy 32.6 30.0 - 100.0 ng/mL  Basic Metabolic Panel (BMET)  Result Value Ref Range   Glucose 91 65 - 99 mg/dL   BUN 17 8 - 27 mg/dL   Creatinine, Ser 1.17 (H) 0.57 - 1.00 mg/dL   GFR calc non Af Amer 47 (L) >59 mL/min/1.73   GFR calc Af Amer 55 (L) >59 mL/min/1.73   BUN/Creatinine Ratio 15 12 - 28   Sodium 144 134 - 144 mmol/L   Potassium 4.4 3.5 - 5.2 mmol/L   Chloride 105 96 - 106 mmol/L   CO2 26 20 - 29 mmol/L   Calcium 9.2 8.7 - 10.3 mg/dL  TSH  Result Value Ref Range   TSH 0.282 (L) 0.450 - 4.500 uIU/mL  CBC with Differential/Platelet  Result Value Ref Range   WBC 3.6 3.4 - 10.8 x10E3/uL   RBC 4.53 3.77 - 5.28 x10E6/uL   Hemoglobin 12.5 11.1 - 15.9  g/dL   Hematocrit 37.4 34.0 - 46.6 %   MCV 83 79 - 97 fL   MCH 27.6 26.6 - 33.0 pg   MCHC 33.4 31.5 - 35.7 g/dL   RDW 13.8 11.7 - 15.4 %   Platelets 213 150 - 450 x10E3/uL   Neutrophils 57 Not Estab. %   Lymphs 29 Not Estab. %   Monocytes 8 Not Estab. %   Eos 5 Not Estab. %   Basos 1 Not Estab. %   Neutrophils Absolute 2.1 1.4 - 7.0 x10E3/uL   Lymphocytes Absolute 1.1 0.7 - 3.1 x10E3/uL   Monocytes Absolute 0.3 0.1 - 0.9 x10E3/uL   EOS (ABSOLUTE) 0.2 0.0 - 0.4 x10E3/uL   Basophils Absolute 0.0 0.0 - 0.2 x10E3/uL   Immature Granulocytes 0 Not Estab. 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  Continue current medication regimen, Lisinopril for kidney protection with diabetes.  CMP today.      Relevant Orders   Comprehensive metabolic panel     Respiratory   Acute frontal sinusitis    Ongoing issues x one month.  Script for Augmentin sent.  Continue Flonase and Claritin daily + may use Coricidin as needed.   - Increased rest - Increasing Fluids - Acetaminophen as needed for fever/pain.  - Saline sinus flushes or a neti pot.  - Humidifying the air Return for worsening or continued symptoms.  If worsening do virtual visit.      Relevant Medications   amoxicillin-clavulanate (AUGMENTIN) 875-125 MG tablet     Endocrine   Diabetes mellitus with chronic kidney disease ([place]) - Primary    Chronic, ongoing.  A1C today 8.4%.  Continue current medication regimen and collaboration with endocrinology (she is to discuss SGLT and/or Metformin with provider).  CMP today,  Urine micro noting ALB 150 and  A:C 30-300, continue Lisinopril for kidney protection.       Relevant Orders   Bayer DCA Hb A1c Waived   Microalbumin, Urine Waived   Comprehensive metabolic panel   Hyperlipidemia associated with type 2 diabetes mellitus (HCC)    Chronic, ongoing.  Continue current medication regimen and adjust as needed.  Lipid and CMP today.         Relevant Orders   Comprehensive metabolic panel   Lipid Panel w/o Chol/HDL Ratio     Other   S/P total thyroidectomy    Chronic, ongoing followed by Dr. Rosario Jacks with endo.  Continue current medication regimen and adjust as needed.  Last TSH was on lower side, will recheck today and notify patient so she may review with endo at upcoming appointment.         Relevant Orders   Thyroid Panel With TSH       Follow up plan: Return in about 3 months (around 03/01/2019) for T2DM, HTN/HLD, CKD.

## 2018-11-29 NOTE — Assessment & Plan Note (Signed)
Ongoing issues x one month.  Script for Augmentin sent.  Continue Flonase and Claritin daily + may use Coricidin as needed.   - Increased rest - Increasing Fluids - Acetaminophen as needed for fever/pain.  - Saline sinus flushes or a neti pot.  - Humidifying the air Return for worsening or continued symptoms.  If worsening do virtual visit.

## 2018-11-29 NOTE — Assessment & Plan Note (Signed)
Chronic, ongoing.  Continue current medication regimen and adjust as needed.  Lipid and CMP today. 

## 2018-11-29 NOTE — Patient Instructions (Signed)
Carbohydrate Counting for Diabetes Mellitus, Adult  Carbohydrate counting is a method of keeping track of how many carbohydrates you eat. Eating carbohydrates naturally increases the amount of sugar (glucose) in the blood. Counting how many carbohydrates you eat helps keep your blood glucose within normal limits, which helps you manage your diabetes (diabetes mellitus). It is important to know how many carbohydrates you can safely have in each meal. This is different for every person. A diet and nutrition specialist (registered dietitian) can help you make a meal plan and calculate how many carbohydrates you should have at each meal and snack. Carbohydrates are found in the following foods:  Grains, such as breads and cereals.  Dried beans and soy products.  Starchy vegetables, such as potatoes, peas, and corn.  Fruit and fruit juices.  Milk and yogurt.  Sweets and snack foods, such as cake, cookies, candy, chips, and soft drinks. How do I count carbohydrates? There are two ways to count carbohydrates in food. You can use either of the methods or a combination of both. Reading "Nutrition Facts" on packaged food The "Nutrition Facts" list is included on the labels of almost all packaged foods and beverages in the [place] It includes:  The serving size.  Information about nutrients in each serving, including the grams (g) of carbohydrate per serving. To use the "Nutrition Facts":  Decide how many servings you will have.  Multiply the number of servings by the number of carbohydrates per serving.  The resulting number is the total amount of carbohydrates that you will be having. Learning standard serving sizes of other foods When you eat carbohydrate foods that are not packaged or do not include "Nutrition Facts" on the label, you need to measure the servings in order to count the amount of carbohydrates:  Measure the foods that you will eat with a food scale or measuring cup, if needed.   Decide how many standard-size servings you will eat.  Multiply the number of servings by 15. Most carbohydrate-rich foods have about 15 g of carbohydrates per serving. ? For example, if you eat 8 oz (170 g) of strawberries, you will have eaten 2 servings and 30 g of carbohydrates (2 servings x 15 g = 30 g).  For foods that have more than one food mixed, such as soups and casseroles, you must count the carbohydrates in each food that is included. The following list contains standard serving sizes of common carbohydrate-rich foods. Each of these servings has about 15 g of carbohydrates:   hamburger bun or  English muffin.   oz (15 mL) syrup.   oz (14 g) jelly.  1 slice of bread.  1 six-inch tortilla.  3 oz (85 g) cooked rice or pasta.  4 oz (113 g) cooked dried beans.  4 oz (113 g) starchy vegetable, such as peas, corn, or potatoes.  4 oz (113 g) hot cereal.  4 oz (113 g) mashed potatoes or  of a large baked potato.  4 oz (113 g) canned or frozen fruit.  4 oz (120 mL) fruit juice.  4-6 crackers.  6 chicken nuggets.  6 oz (170 g) unsweetened dry cereal.  6 oz (170 g) plain fat-free yogurt or yogurt sweetened with artificial sweeteners.  8 oz (240 mL) milk.  8 oz (170 g) fresh fruit or one small piece of fruit.  24 oz (680 g) popped popcorn. Example of carbohydrate counting Sample meal  3 oz (85 g) chicken breast.  6 oz (170 g)   brown rice.  4 oz (113 g) corn.  8 oz (240 mL) milk.  8 oz (170 g) strawberries with sugar-free whipped topping. Carbohydrate calculation 1. Identify the foods that contain carbohydrates: ? Rice. ? Corn. ? Milk. ? Strawberries. 2. Calculate how many servings you have of each food: ? 2 servings rice. ? 1 serving corn. ? 1 serving milk. ? 1 serving strawberries. 3. Multiply each number of servings by 15 g: ? 2 servings rice x 15 g = 30 g. ? 1 serving corn x 15 g = 15 g. ? 1 serving milk x 15 g = 15 g. ? 1 serving  strawberries x 15 g = 15 g. 4. Add together all of the amounts to find the total grams of carbohydrates eaten: ? 30 g + 15 g + 15 g + 15 g = 75 g of carbohydrates total. Summary  Carbohydrate counting is a method of keeping track of how many carbohydrates you eat.  Eating carbohydrates naturally increases the amount of sugar (glucose) in the blood.  Counting how many carbohydrates you eat helps keep your blood glucose within normal limits, which helps you manage your diabetes.  A diet and nutrition specialist (registered dietitian) can help you make a meal plan and calculate how many carbohydrates you should have at each meal and snack. This information is not intended to replace advice given to you by your health care provider. Make sure you discuss any questions you have with your health care provider. Document Released: 05/10/2005 Document Revised: 12/02/2016 Document Reviewed: 10/22/2015 Elsevier Patient Education  2020 Elsevier Inc.  

## 2018-11-30 LAB — COMPREHENSIVE METABOLIC PANEL
ALT: 21 IU/L (ref 0–32)
AST: 23 IU/L (ref 0–40)
Albumin/Globulin Ratio: 1.9 (ref 1.2–2.2)
Albumin: 4.3 g/dL (ref 3.8–4.8)
Alkaline Phosphatase: 93 IU/L (ref 39–117)
BUN/Creatinine Ratio: 19 (ref 12–28)
BUN: 22 mg/dL (ref 8–27)
Bilirubin Total: 0.3 mg/dL (ref 0.0–1.2)
CO2: 22 mmol/L (ref 20–29)
Calcium: 9.8 mg/dL (ref 8.7–10.3)
Chloride: 105 mmol/L (ref 96–106)
Creatinine, Ser: 1.13 mg/dL — ABNORMAL HIGH (ref 0.57–1.00)
GFR calc Af Amer: 57 mL/min/{1.73_m2} — ABNORMAL LOW (ref >59)
GFR calc non Af Amer: 49 mL/min/{1.73_m2} — ABNORMAL LOW (ref >59)
Globulin, Total: 2.3 g/dL (ref 1.5–4.5)
Glucose: 214 mg/dL — ABNORMAL HIGH (ref 65–99)
Potassium: 4.2 mmol/L (ref 3.5–5.2)
Sodium: 145 mmol/L — ABNORMAL HIGH (ref 134–144)
Total Protein: 6.6 g/dL (ref 6.0–8.5)

## 2018-11-30 LAB — THYROID PANEL WITH TSH
Free Thyroxine Index: 3 (ref 1.2–4.9)
T3 Uptake Ratio: 29 % (ref 24–39)
T4, Total: 10.2 ug/dL (ref 4.5–12.0)
TSH: 0.333 u[IU]/mL — ABNORMAL LOW (ref 0.450–4.500)

## 2018-11-30 LAB — LIPID PANEL W/O CHOL/HDL RATIO
Cholesterol, Total: 157 mg/dL (ref 100–199)
HDL: 42 mg/dL (ref >39)
LDL Calculated: 90 mg/dL (ref 0–99)
Triglycerides: 125 mg/dL (ref 0–149)
VLDL Cholesterol Cal: 25 mg/dL (ref 5–40)

## 2018-12-15 DIAGNOSIS — E1165 Type 2 diabetes mellitus with hyperglycemia: Secondary | ICD-10-CM | POA: Diagnosis not present

## 2018-12-15 DIAGNOSIS — E785 Hyperlipidemia, unspecified: Secondary | ICD-10-CM | POA: Diagnosis not present

## 2018-12-15 DIAGNOSIS — E559 Vitamin D deficiency, unspecified: Secondary | ICD-10-CM | POA: Diagnosis not present

## 2018-12-15 DIAGNOSIS — E039 Hypothyroidism, unspecified: Secondary | ICD-10-CM | POA: Diagnosis not present

## 2018-12-15 DIAGNOSIS — N289 Disorder of kidney and ureter, unspecified: Secondary | ICD-10-CM | POA: Diagnosis not present

## 2018-12-19 ENCOUNTER — Other Ambulatory Visit: Payer: Self-pay | Admitting: Nurse Practitioner

## 2018-12-19 NOTE — Telephone Encounter (Signed)
Requested medications are due for refill today?  Yes  Requested medications are on the active medication list?  Yes  Last refill - 07/12/2018  Future visit scheduled?  Yes - 12/20/2018 with [facility] pharmacy and 03/07/2019 with [name], NP.  Notes to clinic: Patient reported taking Lantus 40 units twice daily at last appointment.  Directions on prescriptions need to be changed if this is correct.   Requested Prescriptions  Pending Prescriptions Disp Refills   LANTUS SOLOSTAR 100 UNIT/ML Solostar Pen [Pharmacy Med Name: Lantus SoloStar 100 UNIT/ML Subcutaneous Solution Pen-injector] 30 mL 0    Sig: INJECT [place] DAILY     Endocrinology:  Diabetes - Insulins Failed - 12/19/2018 10:36 AM      Failed - HBA1C is between 0 and 7.9 and within 180 days    Hemoglobin A1C  Date Value Ref Range Status  01/05/2016 9.7  Final   HB A1C (BAYER DCA - WAIVED)  Date Value Ref Range Status  11/29/2018 8.4 (H) <7.0 % Final    Comment:                                          Diabetic Adult            <7.0                                       Healthy Adult        4.3 - 5.7                                                           (DCCT/NGSP) American Diabetes Association's Summary of Glycemic Recommendations for Adults with Diabetes: Hemoglobin A1c <7.0%. More stringent glycemic goals (A1c <6.0%) may further reduce complications at the cost of increased risk of hypoglycemia.          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 ([place])   [place] Cannady, Jolene T, NP   6 months ago Type 2 diabetes mellitus with diabetic neuropathy, with  long-term current use of insulin ([place])   [place], Jolene T, NP   9 months ago Diabetes mellitus due to underlying condition with stage 3 chronic kidney disease, with long-term current use of insulin ([place])   [place], Jolene T, NP   11 months ago Hypertension 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

## 2018-12-20 ENCOUNTER — Telehealth: Payer: Self-pay | Admitting: Pharmacist

## 2018-12-20 ENCOUNTER — Ambulatory Visit (INDEPENDENT_AMBULATORY_CARE_PROVIDER_SITE_OTHER): Payer: Medicare Other | Admitting: Pharmacist

## 2018-12-20 ENCOUNTER — Telehealth: Payer: Self-pay

## 2018-12-20 DIAGNOSIS — Z794 Long term (current) use of insulin: Secondary | ICD-10-CM

## 2018-12-20 DIAGNOSIS — E1122 Type 2 diabetes mellitus with diabetic chronic kidney disease: Secondary | ICD-10-CM | POA: Diagnosis not present

## 2018-12-20 DIAGNOSIS — E0822 Diabetes mellitus due to underlying condition with diabetic chronic kidney disease: Secondary | ICD-10-CM

## 2018-12-20 DIAGNOSIS — I129 Hypertensive chronic kidney disease with stage 1 through stage 4 chronic kidney disease, or unspecified chronic kidney disease: Secondary | ICD-10-CM

## 2018-12-20 DIAGNOSIS — N183 Chronic kidney disease, stage 3 (moderate): Secondary | ICD-10-CM | POA: Diagnosis not present

## 2018-12-20 NOTE — Telephone Encounter (Signed)
We have not gotten anything that we know of.

## 2018-12-20 NOTE — Telephone Encounter (Signed)
Patient states that when she saw Dr. Rosario Jacks on Friday, he wanted to have our most recent lab results faxed to him to determine need to adjust pharmacotherapy.   Have we received visit notes or this request from his office?

## 2018-12-20 NOTE — Patient Instructions (Signed)
Visit Information  Goals Addressed            This Visit's Progress     Patient Stated   . "I want to work on my diabetes"  (pt-stated)       Current Barriers:  . Diabetes: uncontrolled, most recent A1c 8.4% on 11/29/2018  o Follows with endocrinology Dr. Rosario Jacks. Had an appointment 12/15/2018; reports that he wanted to see her most recent labwork from our office. She also notes that they discussed the addition of Januvia; confirms that it was [place] they discussed, not Jardiance.  . Current antihyperglycemic regimen: Trulicity 1.5 mg weekly, Lantus 40 units BID, Humalog 6 units with breakfast, 10 units with lunch, 8 units with supper . Denies hypoglycemic symptoms, including dizziness, lightheadedness, shaking, sweating . Current exercise: Previously walked every morning with some friends with diabetes, but has been too hot lately to do so, and cannot walk at the mall d/t pandemic restrictions . Current blood glucose readings: fasting 110-130, post prandial >200 . Cardiovascular risk reduction: o Current hypertensive regimen: amlodipine 2.5 mg daily, lisinopril 20 mg daily, metoprolol tartrate 25 mg BID o Current hyperlipidemia regimen: atorvastatin 40 mg daily; last LDL at goal <100; 10 year ASCVD risk score 24%; could consider goal LDL <70  Pharmacist Clinical Goal(s):  Marland Kitchen Over the next 90 days, patient with work with PharmD and primary care provider to address optimized management of diabetes  Interventions: . Patient asked if we could send her most recent lab results to Dr. Rosario Jacks. Will collaborate with clinical staff on this.  . Will plan to review Dr. Guerry Bruin note once we receive it. Due to duplicative mechanisms of action of GLP1 and DPP4, it is recommended to not use both together d/t lack of benefit. D/t concurrent CKD, would recommend addition of SGLT2 over DPP4; patient's renal function is appropriate for SGLT2 initiation. Once we have communicated patient's most recent lab work,  I'll call Dr. Guerry Bruin office to inquire about Jardiance initiation over [place].  . Additionally, patient's low TSH needs to be addressed by Dr. Rosario Jacks. Will follow up on this.   Patient Self Care Activities:  . Patient will check blood glucose BID, document, and provide at future appointments . Patient will take medications as prescribed . Patient will contact provider with any episodes of hypoglycemia . Patient will report any questions or concerns to provider   Please see past updates related to this goal by clicking on the "Past Updates" button in the selected goal         The patient verbalized understanding of instructions provided today and declined a print copy of patient instruction materials.   Plan:  - Will follow up with above needs.  - Will outreach patient in 2-4 weeks for continued medication management support  Catie Darnelle Maffucci, PharmD Clinical Pharmacist [place] [phone]

## 2018-12-20 NOTE — Chronic Care Management (AMB) (Signed)
Chronic Care Management   Follow Up Note   12/20/2018 Name: LERIN JECH MRN: [id number] DOB: Sep 27, 1950  Referred by: Venita Lick, NP Reason for referral : Chronic Care Management (Medication Management)   DONNESHA KARG is a 68 y.o. year old female who is a primary care patient of Cannady, Barbaraann Faster, NP. The CCM team was consulted for assistance with chronic disease management and care coordination needs.    Contacted patient today for medication management support.   Review of patient status, including review of consultants reports, relevant laboratory and other test results, and collaboration with appropriate care team members and the patient's provider was performed as part of comprehensive patient evaluation and provision of chronic care management services.    Goals Addressed            This Visit's Progress     Patient Stated   . "I want to work on my diabetes"  (pt-stated)       Current Barriers:  . Diabetes: uncontrolled, most recent A1c 8.4% on 11/29/2018  o Follows with endocrinology Dr. Rosario Jacks. Had an appointment 12/15/2018; reports that he wanted to see her most recent labwork from our office. She also notes that they discussed the addition of Januvia; confirms that it was [place] they discussed, not Jardiance.  . Current antihyperglycemic regimen: Trulicity 1.5 mg weekly, Lantus 40 units BID, Humalog 6 units with breakfast, 10 units with lunch, 8 units with supper . Denies hypoglycemic symptoms, including dizziness, lightheadedness, shaking, sweating . Current exercise: Previously walked every morning with some friends with diabetes, but has been too hot lately to do so, and cannot walk at the mall d/t pandemic restrictions . Current blood glucose readings: fasting 110-130, post prandial >200 . Cardiovascular risk reduction: o Current hypertensive regimen: amlodipine 2.5 mg daily, lisinopril 20 mg daily, metoprolol tartrate 25 mg BID o Current hyperlipidemia  regimen: atorvastatin 40 mg daily; last LDL at goal <100; 10 year ASCVD risk score 24%; could consider goal LDL <70  Pharmacist Clinical Goal(s):  Marland Kitchen Over the next 90 days, patient with work with PharmD and primary care provider to address optimized management of diabetes  Interventions: . Patient asked if we could send her most recent lab results to Dr. Rosario Jacks. Will collaborate with clinical staff on this.  . Will plan to review Dr. Guerry Bruin note once we receive it. Due to duplicative mechanisms of action of GLP1 and DPP4, it is recommended to not use both together d/t lack of benefit. D/t concurrent CKD, would recommend addition of SGLT2 over DPP4; patient's renal function is appropriate for SGLT2 initiation. Once we have communicated patient's most recent lab work, I'll call Dr. Guerry Bruin office to inquire about Jardiance initiation over [place].  . Additionally, patient's low TSH needs to be addressed by Dr. Rosario Jacks. Will follow up on this.   Patient Self Care Activities:  . Patient will check blood glucose BID, document, and provide at future appointments . Patient will take medications as prescribed . Patient will contact provider with any episodes of hypoglycemia . Patient will report any questions or concerns to provider   Please see past updates related to this goal by clicking on the "Past Updates" button in the selected goal          Plan:  - Will follow up with above needs.  - Will outreach patient in 2-4 weeks for continued medication management support  Catie Darnelle Maffucci, PharmD Clinical Pharmacist [place] [phone]

## 2018-12-22 ENCOUNTER — Ambulatory Visit: Payer: Self-pay | Admitting: Pharmacist

## 2018-12-22 DIAGNOSIS — E0822 Diabetes mellitus due to underlying condition with diabetic chronic kidney disease: Secondary | ICD-10-CM

## 2018-12-22 NOTE — Chronic Care Management (AMB) (Signed)
  Chronic Care Management   Note  12/22/2018 Name: [name] MRN: [id number] DOB: [birth year]  Mary Parks is a 68 y.o. year old female who is a primary care patient of Cannady, Barbaraann Faster, NP. The CCM team was consulted for assistance with chronic disease management and care coordination needs.    Contacted endocrinology Dr. Guerry Bruin office; left message requesting they send their request for patient's most updated labwork again. Additionally, mentioned starting Jardiance in this patient for glycemic and nephroprotective benefit. Requested they call me back to discuss.   Follow up plan: - Will follow up with patient in 2-3 weeks as previously discussed  Catie Darnelle Maffucci, PharmD Clinical Pharmacist [place] [phone]

## 2018-12-25 ENCOUNTER — Ambulatory Visit (INDEPENDENT_AMBULATORY_CARE_PROVIDER_SITE_OTHER): Payer: Medicare Other

## 2018-12-25 VITALS — Ht 64.5 in | Wt 182.0 lb

## 2018-12-25 DIAGNOSIS — Z0000 Encounter for general adult medical examination without abnormal findings: Secondary | ICD-10-CM

## 2018-12-25 NOTE — Patient Instructions (Signed)
[name] , Thank you for taking time to come for your Medicare Wellness Visit. I appreciate your ongoing commitment to your health goals. Please review the following plan we discussed and let me know if I can assist you in the future.   Screening recommendations/referrals: Colonoscopy: completed 11/12/2016 Mammogram: completed 11/22/2018 Bone Density: completed 01/20/2010 Recommended yearly ophthalmology/optometry visit for glaucoma screening and checkup Recommended yearly dental visit for hygiene and checkup  Vaccinations: Influenza vaccine: due 01/23/2019 Pneumococcal vaccine: up to date Tdap vaccine: due, check with your insurance company for coverage  Shingles vaccine: declined    Advanced directives: please pick up a copy of this information next time you are in the office.   Conditions/risks identified: diabetic- already in contact with Chronic Care Management team  Next appointment: follow up in one year for your annual wellness visit.    Preventive Care 72 Years and Older, Female Preventive care refers to lifestyle choices and visits with your health care provider that can promote health and wellness. What does preventive care include?  A yearly physical exam. This is also called an annual well check.  Dental exams once or twice a year.  Routine eye exams. Ask your health care provider how often you should have your eyes checked.  Personal lifestyle choices, including:  Daily care of your teeth and gums.  Regular physical activity.  Eating a healthy diet.  Avoiding tobacco and drug use.  Limiting alcohol use.  Practicing safe sex.  Taking low-dose aspirin every day.  Taking vitamin and mineral supplements as recommended by your health care provider. What happens during an annual well check? The services and screenings done by your health care provider during your annual well check will depend on your age, overall health, lifestyle risk factors, and family  history of disease. Counseling  Your health care provider may ask you questions about your:  Alcohol use.  Tobacco use.  Drug use.  Emotional well-being.  Home and relationship well-being.  Sexual activity.  Eating habits.  History of falls.  Memory and ability to understand (cognition).  Work and work Statistician.  Reproductive health. Screening  You may have the following tests or measurements:  Height, weight, and BMI.  Blood pressure.  Lipid and cholesterol levels. These may be checked every 5 years, or more frequently if you are over 55 years old.  Skin check.  Lung cancer screening. You may have this screening every year starting at age 67 if you have a 30-pack-year history of smoking and currently smoke or have quit within the past 15 years.  Fecal occult blood test (FOBT) of the stool. You may have this test every year starting at age 76.  Flexible sigmoidoscopy or colonoscopy. You may have a sigmoidoscopy every 5 years or a colonoscopy every 10 years starting at age 54.  Hepatitis C blood test.  Hepatitis B blood test.  Sexually transmitted disease (STD) testing.  Diabetes screening. This is done by checking your blood sugar (glucose) after you have not eaten for a while (fasting). You may have this done every 1-3 years.  Bone density scan. This is done to screen for osteoporosis. You may have this done starting at age 49.  Mammogram. This may be done every 1-2 years. Talk to your health care provider about how often you should have regular mammograms. Talk with your health care provider about your test results, treatment options, and if necessary, the need for more tests. Vaccines  Your health care provider may recommend  certain vaccines, such as:  Influenza vaccine. This is recommended every year.  Tetanus, diphtheria, and acellular pertussis (Tdap, Td) vaccine. You may need a Td booster every 10 years.  Zoster vaccine. You may need this after  age 68.  Pneumococcal 13-valent conjugate (PCV13) vaccine. One dose is recommended after age 64.  Pneumococcal polysaccharide (PPSV23) vaccine. One dose is recommended after age 21. Talk to your health care provider about which screenings and vaccines you need and how often you need them. This information is not intended to replace advice given to you by your health care provider. Make sure you discuss any questions you have with your health care provider. Document Released: 06/06/2015 Document Revised: 01/28/2016 Document Reviewed: 03/11/2015 Elsevier Interactive Patient Education  2017 [place] Prevention in the Home Falls can cause injuries. They can happen to people of all ages. There are many things you can do to make your home safe and to help prevent falls. What can I do on the outside of my home?  Regularly fix the edges of walkways and driveways and fix any cracks.  Remove anything that might make you trip as you walk through a door, such as a raised step or threshold.  Trim any bushes or trees on the path to your home.  Use bright outdoor lighting.  Clear any walking paths of anything that might make someone trip, such as rocks or tools.  Regularly check to see if handrails are loose or broken. Make sure that both sides of any steps have handrails.  Any raised decks and porches should have guardrails on the edges.  Have any leaves, snow, or ice cleared regularly.  Use sand or salt on walking paths during winter.  Clean up any spills in your garage right away. This includes oil or grease spills. What can I do in the bathroom?  Use night lights.  Install grab bars by the toilet and in the tub and shower. Do not use towel bars as grab bars.  Use non-skid mats or decals in the tub or shower.  If you need to sit down in the shower, use a plastic, non-slip stool.  Keep the floor dry. Clean up any water that spills on the floor as soon as it happens.   Remove soap buildup in the tub or shower regularly.  Attach bath mats securely with double-sided non-slip rug tape.  Do not have throw rugs and other things on the floor that can make you trip. What can I do in the bedroom?  Use night lights.  Make sure that you have a light by your bed that is easy to reach.  Do not use any sheets or blankets that are too big for your bed. They should not hang down onto the floor.  Have a firm chair that has side arms. You can use this for support while you get dressed.  Do not have throw rugs and other things on the floor that can make you trip. What can I do in the kitchen?  Clean up any spills right away.  Avoid walking on wet floors.  Keep items that you use a lot in easy-to-reach places.  If you need to reach something above you, use a strong step stool that has a grab bar.  Keep electrical cords out of the way.  Do not use floor polish or wax that makes floors slippery. If you must use wax, use non-skid floor wax.  Do not have throw rugs and other  things on the floor that can make you trip. What can I do with my stairs?  Do not leave any items on the stairs.  Make sure that there are handrails on both sides of the stairs and use them. Fix handrails that are broken or loose. Make sure that handrails are as long as the stairways.  Check any carpeting to make sure that it is firmly attached to the stairs. Fix any carpet that is loose or worn.  Avoid having throw rugs at the top or bottom of the stairs. If you do have throw rugs, attach them to the floor with carpet tape.  Make sure that you have a light switch at the top of the stairs and the bottom of the stairs. If you do not have them, ask someone to add them for you. What else can I do to help prevent falls?  Wear shoes that:  Do not have high heels.  Have rubber bottoms.  Are comfortable and fit you well.  Are closed at the toe. Do not wear sandals.  If you use a  stepladder:  Make sure that it is fully opened. Do not climb a closed stepladder.  Make sure that both sides of the stepladder are locked into place.  Ask someone to hold it for you, if possible.  Clearly mark and make sure that you can see:  Any grab bars or handrails.  First and last steps.  Where the edge of each step is.  Use tools that help you move around (mobility aids) if they are needed. These include:  Canes.  Walkers.  Scooters.  Crutches.  Turn on the lights when you go into a dark area. Replace any light bulbs as soon as they burn out.  Set up your furniture so you have a clear path. Avoid moving your furniture around.  If any of your floors are uneven, fix them.  If there are any pets around you, be aware of where they are.  Review your medicines with your doctor. Some medicines can make you feel dizzy. This can increase your chance of falling. Ask your doctor what other things that you can do to help prevent falls. This information is not intended to replace advice given to you by your health care provider. Make sure you discuss any questions you have with your health care provider. Document Released: 03/06/2009 Document Revised: 10/16/2015 Document Reviewed: 06/14/2014 Elsevier Interactive Patient Education  2017 [employer].

## 2018-12-25 NOTE — Progress Notes (Signed)
Subjective:   Mary Parks is a 68 y.o. female who presents for Medicare Annual (Subsequent) preventive examination.  This visit is being conducted via phone call  - after an attmept to do on video chat - due to the COVID-19 pandemic. This patient has given me verbal consent via phone to conduct this visit, patient states they are participating from their home address. Some vital signs may be absent or patient reported.   Patient identification: identified by name, DOB, and current address.    Review of Systems:   Cardiac Risk Factors include: advanced age (>2men, >21 women);hypertension;dyslipidemia;diabetes mellitus;obesity (BMI >30kg/m2)     Objective:     Vitals: Ht 5' 4.5" (1.638 m) Comment: pt reported  Wt 182 lb (82.6 kg) Comment: pt reported  LMP  (LMP Unknown)   BMI 30.76 kg/m   Body mass index is 30.76 kg/m.  Advanced Directives 12/25/2018 06/02/2018 01/16/2018 12/19/2017 08/02/2017 11/26/2016 11/12/2016  Does Patient Have a Medical Advance Directive? No No No No No No No  Would patient like information on creating a medical advance directive? - - - Yes (MAU/Ambulatory/Procedural Areas - Information given) - Yes (MAU/Ambulatory/Procedural Areas - Information given) Yes (MAU/Ambulatory/Procedural Areas - Information given)    Tobacco Social History   Tobacco Use  Smoking Status Never Smoker  Smokeless Tobacco Never Used     Counseling given: Not Answered   Clinical Intake:  Pre-visit preparation completed: Yes  Pain : No/denies pain     Nutritional Status: BMI > 30  Obese Nutritional Risks: None Diabetes: Yes CBG done?: No Did pt. bring in CBG monitor from home?: No  How often do you need to have someone help you when you read instructions, pamphlets, or other written materials from your doctor or pharmacy?: 1 - Never  Nutrition Risk Assessment:  Has the patient had any N/V/D within the last 2 months?  No  Does the patient have any non-healing wounds?   No  Has the patient had any unintentional weight loss or weight gain?  No   Diabetes:  Is the patient diabetic?  Yes  If diabetic, was a CBG obtained today?  No  Did the patient bring in their glucometer from home?  No  How often do you monitor your CBG's? Twice a day   Financial Strains and Diabetes Management:  Are you having any financial strains with the device, your supplies or your medication? No .  Does the patient want to be seen by Chronic Care Management for management of their diabetes?  Yes  Would the patient like to be referred to a Nutritionist or for Diabetic Management?  Yes    Already in contact with CCM team   Diabetic Exams:  Diabetic Eye Exam: goes to Dr.Shade- cancelled this year due to covid-19, unable to schedule currently. [employer] did one at home recently   Diabetic Foot Exam: 12/27/2017   Interpreter Needed?: No  Information entered by :: Tiffany Hill,LPN  Past Medical History:  Diagnosis Date  . Asthma   . Chest pain   . DM2 (diabetes mellitus, type 2) ([place]) 1987  . Dyspnea    Cath normal coronaries. normal EF. RA 11. PA 37/17 (26) LVEDP 36  . GERD (gastroesophageal reflux disease)   . HTN (hypertension)   . Hyperlipidemia   . Obesity   . Osteoarthritis    knees  . Renal disease   . Thyroid disease    having thyroid removed 04/14/15  . Vertigo  none recently   Past Surgical History:  Procedure Laterality Date  . CARDIAC CATHETERIZATION    . CATARACT EXTRACTION W/PHACO Right 03/31/2015   Procedure: CATARACT EXTRACTION PHACO AND INTRAOCULAR LENS PLACEMENT (IOC);  Surgeon: Ronnell Freshwater, MD;  Location: [facility];  Service: Ophthalmology;  Laterality: Right;  DIABETIC - insulin  . COLONOSCOPY WITH PROPOFOL N/A 11/12/2016   Procedure: COLONOSCOPY WITH PROPOFOL;  Surgeon: [name], MD;  Location: [facility];  Service: Endoscopy;  Laterality: N/A;  Diabetic - insulin  . EYE SURGERY    . HERNIA REPAIR     umbilical   . POLYPECTOMY  11/12/2016   Procedure: POLYPECTOMY INTESTINAL;  Surgeon: [name], MD;  Location: [facility];  Service: Endoscopy;;  . THYROIDECTOMY N/A 04/14/2015   Procedure: THYROIDECTOMY;  Surgeon: Margaretha Sheffield, MD;  Location: [facility];  Service: ENT;  Laterality: N/A;   Family History  Problem Relation Age of Onset  . Diabetes Mother   . Diabetes Father   . Diabetes Sister   . Diabetes Brother   . Diabetes Sister   . Diabetes Sister   . Diabetes Maternal Grandmother   . Diabetes Maternal Grandfather   . Diabetes Paternal Grandmother   . Diabetes Paternal Grandfather   . Heart disease Neg Hx   . Coronary artery disease Neg Hx   . Breast cancer Neg Hx    Social History   Socioeconomic History  . Marital status: Widowed    Spouse name: Not on file  . Number of children: Not on file  . Years of education: Not on file  . Highest education level: Some college, no degree  Occupational History  . 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 Financial resource strain: Not hard at all  . Food insecurity    Worry: Never true    Inability: Never true  . Transportation needs    Medical: No    Non-medical: No  Tobacco Use  . Smoking status: Never Smoker  . Smokeless tobacco: Never Used  Substance and Sexual Activity  . Alcohol use: No  . Drug use: No  . Sexual activity: Never  Lifestyle  . Physical activity    Days per week: 2 days    Minutes per session: 60 min  . Stress: Not at all  Relationships  . Social connections    Talks on phone: More than three times a week    Gets together: More than three times a week    Attends religious service: More than 4 times per year    Active member of club or organization: Yes    Attends meetings of clubs or organizations: More than 4 times per year    Relationship status: Widowed  Other Topics Concern  . Not on file  Social History Narrative  . Not on file     Outpatient Encounter Medications as of 12/25/2018  Medication Sig  . albuterol (PROVENTIL HFA;VENTOLIN HFA) 108 (90 Base) MCG/ACT inhaler Inhale 2 puffs into the lungs every 6 (six) hours as needed for wheezing or shortness of breath.  Marland Kitchen amLODipine (NORVASC) 2.5 MG tablet Take 2.5 mg by mouth daily.  Marland Kitchen aspirin EC 81 MG tablet Take 81 mg by mouth daily.  Marland Kitchen atorvastatin (LIPITOR) 40 MG tablet TAKE 1 TABLET BY MOUTH AT BEDTIME  . budesonide-formoterol (SYMBICORT) 160-4.5 MCG/ACT inhaler Inhale 2 puffs into the lungs 2 (two) times daily.  . calcium-vitamin D (  OSCAL WITH D) 500-200 MG-UNIT tablet Take 1 tablet by mouth.  . cetirizine (ZYRTEC) 10 MG tablet Take 10 mg by mouth daily.  . Dulaglutide (TRULICITY) 1.5 XI/3.3AS SOPN INJECT 1.5 (1 SYRINGE) INTO THE SKIN ONCE A WEEK  . fluticasone (FLONASE) 50 MCG/ACT nasal spray USE 2 SPRAY(S) IN EACH NOSTRIL ONCE DAILY  . Insulin Glargine (LANTUS SOLOSTAR) 100 UNIT/ML Solostar Pen Inject 40 Units into the skin 2 (two) times daily.  . insulin lispro (HUMALOG) 100 UNIT/ML KwikPen INJECT 20 UNITS SUBCUTANEOUSLY BEFORE BREAKFAST AND LUNCH, AND 30 UNITS BEFORE EVENING MEAL (Patient taking differently: 6 units in the am, 10 unit at lunch 8 in evening)  . levothyroxine (SYNTHROID, LEVOTHROID) 100 MCG tablet Take 100 mcg by mouth daily.  Marland Kitchen lisinopril (PRINIVIL,ZESTRIL) 20 MG tablet Take 1 tablet (20 mg total) by mouth daily.  . meclizine (ANTIVERT) 12.5 MG tablet Take 1 tablet (12.5 mg total) by mouth 3 (three) times daily as needed for dizziness.  . metoprolol tartrate (LOPRESSOR) 25 MG tablet Take 25 mg by mouth 2 (two) times daily.  Marland Kitchen omeprazole (PRILOSEC) 20 MG capsule Take 1 capsule (20 mg total) by mouth daily.  . ONE TOUCH ULTRA TEST test strip 1 each by Other route 2 (two) times daily.   [name] DELICA LANCETS 50N MISC 2 (two) times daily.    No facility-administered encounter medications on file as of 12/25/2018.     Activities of Daily Living In your  present state of health, do you have any difficulty performing the following activities: 12/25/2018  Hearing? N  Vision? N  Comment reading glasses  Difficulty concentrating or making decisions? N  Walking or climbing stairs? N  Dressing or bathing? N  Doing errands, shopping? N  Preparing Food and eating ? N  Using the Toilet? N  In the past six months, have you accidently leaked urine? N  Do you have problems with loss of bowel control? N  Managing your Medications? N  Managing your Finances? N  Housekeeping or managing your Housekeeping? N  Some recent data might be hidden    Patient Care Team: Venita Lick, NP as PCP - General (Nurse Practitioner) [place], Thomasena Edis, MD as Referring Physician (Nephrology) De Hollingshead, [facility] as Pharmacist (Pharmacist)    Assessment:   This is a routine wellness examination for [place].  Exercise Activities and Dietary recommendations Current Exercise Habits: Home exercise routine, Type of exercise: walking, Time (Minutes): 60, Frequency (Times/Week): 2, Weekly Exercise (Minutes/Week): 120, Intensity: Mild, Exercise limited by: None identified  Goals    . "I want to work on my diabetes"  (pt-stated)     Current Barriers:  . Diabetes: uncontrolled, most recent A1c 8.4% on 11/29/2018  o Follows with endocrinology Dr. Rosario Jacks. Had an appointment 12/15/2018; reports that he wanted to see her most recent labwork from our office. She also notes that they discussed the addition of Januvia; confirms that it was [place] they discussed, not Jardiance.  . Current antihyperglycemic regimen: Trulicity 1.5 mg weekly, Lantus 40 units BID, Humalog 6 units with breakfast, 10 units with lunch, 8 units with supper . Denies hypoglycemic symptoms, including dizziness, lightheadedness, shaking, sweating . Current exercise: Previously walked every morning with some friends with diabetes, but has been too hot lately to do so, and cannot walk at the mall d/t  pandemic restrictions . Current blood glucose readings: fasting 110-130, post prandial >200 . Cardiovascular risk reduction: o Current hypertensive regimen: amlodipine 2.5 mg daily, lisinopril 20  mg daily, metoprolol tartrate 25 mg BID o Current hyperlipidemia regimen: atorvastatin 40 mg daily; last LDL at goal <100; 10 year ASCVD risk score 24%; could consider goal LDL <70  Pharmacist Clinical Goal(s):  Marland Kitchen Over the next 90 days, patient with work with PharmD and primary care provider to address optimized management of diabetes  Interventions: . Patient asked if we could send her most recent lab results to Dr. Rosario Jacks. Will collaborate with clinical staff on this.  . Will plan to review Dr. Guerry Bruin note once we receive it. Due to duplicative mechanisms of action of GLP1 and DPP4, it is recommended to not use both together d/t lack of benefit. D/t concurrent CKD, would recommend addition of SGLT2 over DPP4; patient's renal function is appropriate for SGLT2 initiation. Once we have communicated patient's most recent lab work, I'll call Dr. Guerry Bruin office to inquire about Jardiance initiation over [place].  . Additionally, patient's low TSH needs to be addressed by Dr. Rosario Jacks. Will follow up on this.   Patient Self Care Activities:  . Patient will check blood glucose BID, document, and provide at future appointments . Patient will take medications as prescribed . Patient will contact provider with any episodes of hypoglycemia . Patient will report any questions or concerns to provider   Please see past updates related to this goal by clicking on the "Past Updates" button in the selected goal      . DIET - INCREASE WATER INTAKE     Recommend drinking at least 6-8 glasses of water a day     . Increase water intake     Recommend drinking at least 4-5 glasses of water a day        Fall Risk: Fall Risk  12/25/2018 11/29/2018 12/19/2017 11/26/2016 10/03/2015  Falls in the past year? 0 0 No No No   Follow up - Falls evaluation completed - - -    FALL RISK PREVENTION PERTAINING TO THE HOME:  Any stairs in or around the home? No  If so, are there any without handrails? No   Home free of loose throw rugs in walkways, pet beds, electrical cords, etc? Yes  Adequate lighting in your home to reduce risk of falls? Yes   ASSISTIVE DEVICES UTILIZED TO PREVENT FALLS:  Life alert? No  Use of a cane, walker or w/c? No  Grab bars in the bathroom? No  Shower chair or bench in shower? No  Elevated toilet seat or a handicapped toilet? No   TIMED UP AND GO:  Unable to perform   Depression Screen PHQ 2/9 Scores 12/25/2018 12/19/2017 11/26/2016 10/03/2015  PHQ - 2 Score 0 0 0 0     Cognitive Function     6CIT Screen 12/19/2017 11/26/2016  What Year? 0 points 0 points  What month? 0 points 0 points  What time? 0 points 0 points  Count back from 20 0 points 0 points  Months in reverse 0 points 0 points  Repeat phrase 2 points 0 points  Total Score 2 0    Immunization History  Administered Date(s) Administered  . Influenza, High Dose Seasonal PF 02/25/2018  . Influenza,inj,Quad PF,6+ Mos 03/25/2015  . Influenza-Unspecified 02/21/2014, 03/31/2016, 04/01/2017  . Pneumococcal Conjugate-13 09/26/2013  . Pneumococcal Polysaccharide-23 03/13/2008, 03/25/2015  . Td 03/13/2008    Qualifies for Shingles Vaccine? Yes  Zostavax completed n/a. Due for Shingrix. Education has been provided regarding the importance of this vaccine. Pt has been advised to call insurance company to  determine out of pocket expense. Advised may also receive vaccine at local pharmacy or Health Dept. Verbalized acceptance and understanding.  Tdap: Discussed need for TD/TDAP vaccine, patient verbalized understanding that this is not covered as a preventative with there insurance and to call the office if she gets develops any new skin injuries, ie: cuts, scrapes, bug bites, or open wounds.  Flu Vaccine: due 01/2019   Pneumococcal Vaccine: up to date   Screening Tests Health Maintenance  Topic Date Due  . OPHTHALMOLOGY EXAM  09/29/2018  . INFLUENZA VACCINE  12/23/2018  . TETANUS/TDAP  12/25/2019 (Originally 03/13/2018)  . FOOT EXAM  12/28/2018  . HEMOGLOBIN A1C  06/01/2019  . MAMMOGRAM  11/21/2020  . COLONOSCOPY  11/12/2021  . Hepatitis C Screening  Completed  . PNA vac Low Risk Adult  Completed  . DEXA SCAN  Addressed    Cancer Screenings:  Colorectal Screening: Completed 11/12/2016. Repeat every 5 years  Mammogram: Completed 11/22/2018. Repeat every year  Bone Density: Completed 01/20/2010.   Lung Cancer Screening: (Low Dose CT Chest recommended if Age 68-80 years, 30 pack-year currently smoking OR have quit w/in 15years.) does not qualify.     Additional Screening:  Hepatitis C Screening: does qualify; Completed 10/03/2015  Dental Screening: Recommended annual dental exams for proper oral hygiene   Community Resource Referral:  CRR required this visit?  No       Plan:  I have personally reviewed and addressed the Medicare Annual Wellness questionnaire and have noted the following in the patient's chart:  A. Medical and social history B. Use of alcohol, tobacco or illicit drugs  C. Current medications and supplements D. Functional ability and status E.  Nutritional status F.  Physical activity G. Advance directives H. List of other physicians I.  Hospitalizations, surgeries, and ER visits in previous 12 months J.  [place] such as hearing and vision if needed, cognitive and depression L. Referrals and appointments   In addition, I have reviewed and discussed with patient certain preventive protocols, quality metrics, and best practice recommendations. A written personalized care plan for preventive services as well as general preventive health recommendations were provided to patient.   Signed,    Bevelyn Ngo, LPN  07/30/8826 Nurse Health Advisor    Nurse Notes: none

## 2019-01-03 ENCOUNTER — Ambulatory Visit (INDEPENDENT_AMBULATORY_CARE_PROVIDER_SITE_OTHER): Payer: Medicare Other | Admitting: Pharmacist

## 2019-01-03 DIAGNOSIS — N183 Chronic kidney disease, stage 3 (moderate): Secondary | ICD-10-CM | POA: Diagnosis not present

## 2019-01-03 DIAGNOSIS — Z794 Long term (current) use of insulin: Secondary | ICD-10-CM

## 2019-01-03 DIAGNOSIS — E0822 Diabetes mellitus due to underlying condition with diabetic chronic kidney disease: Secondary | ICD-10-CM

## 2019-01-03 NOTE — Patient Instructions (Signed)
Visit Information  Goals Addressed            This Visit's Progress     Patient Stated   . "I want to work on my diabetes"  (pt-stated)       Current Barriers:  . Diabetes: uncontrolled, most recent A1c 8.4% on 11/29/2018  o Follows with endocrinology Dr. Rosario Jacks. Had an appointment 12/15/2018; reports that he wanted to see her most recent labwork from our office. I contacted his office on 7/31 asking for them to refax the request.  o Patient notes that she started Januvia 100 mg daily . Current antihyperglycemic regimen: Trulicity 1.5 mg weekly, Lantus 40 units BID, Humalog 6 units with breakfast, 10 units with lunch, 8 units with supper, Januvia 100 mg daily . Denies hypoglycemic symptoms, including dizziness, lightheadedness, shaking, sweating . Current meal patterns: o Breakfast: packet of grits + sausage; egg once in a while; occasional honey nut oats; water  o Lunch: Eats out a lot; fish sandwich, chicken salads; often doesn't eat all the bread o Snacks: Nabs, funions; dark chocolate with almonds  o Dinner: Pork chops; occasional baked beans, little rice, little mashed potatoes o Drinks: Water; Surveyor, minerals packets;  Marland Kitchen Current exercise: Still too hot to walk with her friends in the mornings, but trying to stay active around the house . Current blood glucose readings: fasting 110-130, post prandial ~200s . Cardiovascular risk reduction: o Current hypertensive regimen: amlodipine 2.5 mg daily, lisinopril 20 mg daily, metoprolol tartrate 25 mg BID o Current hyperlipidemia regimen: atorvastatin 40 mg daily; last LDL at goal <100; 10 year ASCVD risk score 24%; could consider goal LDL <70  Pharmacist Clinical Goal(s):  Marland Kitchen Over the next 90 days, patient with work with PharmD and primary care provider to address optimized management of diabetes  Interventions: . Patient will contact Dr. Guerry Bruin office to see if they ever received lab results, particularly TSH level.  Marland Kitchen Extensive  discussion on goal A1c, goal fasting, and goal post prandial glucoses. Discussed options for increasing protein and decreasing carbohydrates in her typical meals. Patient verbalized understanding . Discussed that small tweaks of mealtime Humalog may be required in the future, especially given large basal/bolus mismatch.   Patient Self Care Activities:  . Patient will check blood glucose BID, document, and provide at future appointments . Patient will take medications as prescribed . Patient will contact provider with any episodes of hypoglycemia . Patient will report any questions or concerns to provider   Please see past updates related to this goal by clicking on the "Past Updates" button in the selected goal         The patient verbalized understanding of instructions provided today and declined a print copy of patient instruction materials.   Plan:  - Will outreach patient in ~4-5 weeks for continued medication management support  Catie Darnelle Maffucci, PharmD Clinical Pharmacist [place] [phone]

## 2019-01-03 NOTE — Chronic Care Management (AMB) (Signed)
Chronic Care Management   Follow Up Note   01/03/2019 Name: Mary Parks MRN: [id number] DOB: 14-Dec-1950  Referred by: Venita Lick, NP Reason for referral : Chronic Care Management (Medication Management)   Mary Parks is a 68 y.o. year old female who is a primary care patient of Cannady, Barbaraann Faster, NP. The CCM team was consulted for assistance with chronic disease management and care coordination needs.    Contacted patient telephonically to follow up on medication management.   Review of patient status, including review of consultants reports, relevant laboratory and other test results, and collaboration with appropriate care team members and the patient's provider was performed as part of comprehensive patient evaluation and provision of chronic care management services.    Goals Addressed            This Visit's Progress     Patient Stated   . "I want to work on my diabetes"  (pt-stated)       Current Barriers:  . Diabetes: uncontrolled, most recent A1c 8.4% on 11/29/2018  o Follows with endocrinology Dr. Rosario Jacks. Had an appointment 12/15/2018; reports that he wanted to see her most recent labwork from our office. I contacted his office on 7/31 asking for them to refax the request.  o Patient notes that she started Januvia 100 mg daily . Current antihyperglycemic regimen: Trulicity 1.5 mg weekly, Lantus 40 units BID, Humalog 6 units with breakfast, 10 units with lunch, 8 units with supper, Januvia 100 mg daily . Denies hypoglycemic symptoms, including dizziness, lightheadedness, shaking, sweating . Current meal patterns: o Breakfast: packet of grits + sausage; egg once in a while; occasional honey nut oats; water  o Lunch: Eats out a lot; fish sandwich, chicken salads; often doesn't eat all the bread o Snacks: Nabs, funions; dark chocolate with almonds  o Dinner: Pork chops; occasional baked beans, little rice, little mashed potatoes o Drinks: Water; Surveyor, minerals  packets;  Marland Kitchen Current exercise: Still too hot to walk with her friends in the mornings, but trying to stay active around the house . Current blood glucose readings: fasting 110-130, post prandial ~200s . Cardiovascular risk reduction: o Current hypertensive regimen: amlodipine 2.5 mg daily, lisinopril 20 mg daily, metoprolol tartrate 25 mg BID o Current hyperlipidemia regimen: atorvastatin 40 mg daily; last LDL at goal <100; 10 year ASCVD risk score 24%; could consider goal LDL <70  Pharmacist Clinical Goal(s):  Marland Kitchen Over the next 90 days, patient with work with PharmD and primary care provider to address optimized management of diabetes  Interventions: . Patient will contact Dr. Guerry Bruin office to see if they ever received lab results, particularly TSH level.  Marland Kitchen Extensive discussion on goal A1c, goal fasting, and goal post prandial glucoses. Discussed options for increasing protein and decreasing carbohydrates in her typical meals. Patient verbalized understanding . Discussed that small tweaks of mealtime Humalog may be required in the future, especially given large basal/bolus mismatch.   Patient Self Care Activities:  . Patient will check blood glucose BID, document, and provide at future appointments . Patient will take medications as prescribed . Patient will contact provider with any episodes of hypoglycemia . Patient will report any questions or concerns to provider   Please see past updates related to this goal by clicking on the "Past Updates" button in the selected goal          Plan:  - Will outreach patient in ~4-5 weeks for continued medication management support  Catie  Darnelle Maffucci, PharmD Clinical Pharmacist [place] [phone]

## 2019-01-15 DIAGNOSIS — N183 Chronic kidney disease, stage 3 (moderate): Secondary | ICD-10-CM | POA: Diagnosis not present

## 2019-01-30 ENCOUNTER — Other Ambulatory Visit: Payer: Self-pay | Admitting: Nurse Practitioner

## 2019-02-07 ENCOUNTER — Ambulatory Visit: Payer: Self-pay | Admitting: Pharmacist

## 2019-02-07 ENCOUNTER — Telehealth: Payer: Self-pay

## 2019-02-07 NOTE — Chronic Care Management (AMB) (Signed)
  Chronic Care Management   Note  02/07/2019 Name: Mary Parks MRN: [id number] DOB: 05-06-1951  Mary Parks is a 68 y.o. year old female who is a primary care patient of Cannady, Barbaraann Faster, NP. The CCM team was consulted for assistance with chronic disease management and care coordination needs.    Attempted to contact patient for medication management support. Left HIPAA compliant message for patient to return my call at her convenience.   Follow up plan: - If I do not hear back, will outreach again in the next 6-8 weeks for continued medication management support  Catie Darnelle Maffucci, PharmD Clinical Pharmacist [place] [phone]

## 2019-02-18 ENCOUNTER — Other Ambulatory Visit: Payer: Self-pay | Admitting: Family Medicine

## 2019-03-07 ENCOUNTER — Ambulatory Visit: Payer: Medicare Other | Admitting: Nurse Practitioner

## 2019-03-09 ENCOUNTER — Ambulatory Visit: Payer: Medicare Other | Admitting: Pharmacist

## 2019-03-09 DIAGNOSIS — E1165 Type 2 diabetes mellitus with hyperglycemia: Secondary | ICD-10-CM | POA: Diagnosis not present

## 2019-03-09 DIAGNOSIS — E039 Hypothyroidism, unspecified: Secondary | ICD-10-CM | POA: Diagnosis not present

## 2019-03-09 DIAGNOSIS — E0822 Diabetes mellitus due to underlying condition with diabetic chronic kidney disease: Secondary | ICD-10-CM

## 2019-03-09 DIAGNOSIS — E785 Hyperlipidemia, unspecified: Secondary | ICD-10-CM | POA: Diagnosis not present

## 2019-03-09 DIAGNOSIS — M899 Disorder of bone, unspecified: Secondary | ICD-10-CM | POA: Diagnosis not present

## 2019-03-09 DIAGNOSIS — N289 Disorder of kidney and ureter, unspecified: Secondary | ICD-10-CM | POA: Diagnosis not present

## 2019-03-09 NOTE — Patient Instructions (Addendum)
Mary Parks,   It was SO GREAT talking to you today! I can't want to hear what you A1c is.   I'm including my favorite handout about eating healthy with diabetes. You can see the different types of foods (proteins, fats, vegetables, and carbohydrates). We want to limit your serving of carbohydrates to no more than 1/4 of your plate. I also love the last page where it lists appropriate portion sizes of carbohydrates - a lot of times, we pour a much bigger bowl of cereal that we really should be eating!  We talked about the Rule of 15: If you have a blood sugar less than 70, you should consume about 15 grams of carbohydrates. This is about 1/2 a glass of juice, 1/2 a glass of regular soda, 3-4 pieces of sugar candy, or 3-4 glucose tablets. Wait 15 minutes. Then, recheck your sugar. If it is back up to greater than 70, that's perfect - eat a small snack with some protein to keep your sugar from dropping again. If it isn't greater than 70, do it all again - eat another 15 grams of carbohydrates, wait another 15 minutes, then check to make sure it is greater than 70.   The goal A1c is less than 7%. Generally, this corresponds with fasting sugars all less than 130, and sugars about 2 hours after a meal being less than 180. If your A1c comes back higher than we expect it, start checking sugars about 2 hours after meals (alternating between meals) to see if we need to tweak any of your mealtime insulin doses.   Feel free to give me a call if you have any questions or concerns!  Visit Information  Goals Addressed            This Visit's Progress     Patient Stated   . PharmD "I want to get my sugars better controlled" (pt-stated)       Current Barriers:  . Diabetes: uncontrolled, most recent A1c 8.4% on 11/29/2018  o Follows with endocrinology Dr. Rosario Jacks. Had appointment with him today. Notes that A1c was checked, she has not received results yet.  . Current antihyperglycemic regimen: Trulicity 1.5 mg  weekly, Lantus 40 units BID, Humalog 6 units with breakfast, 10 units with lunch, 8 units with supper, Januvia 100 mg daily . Current exercise: Has resumed walking with her group of friends in the morning . Current blood glucose readings: fasting 100s; bedtime: 150-170s . Cardiovascular risk reduction: o Current hypertensive regimen: amlodipine 2.5 mg daily, lisinopril 20 mg daily, metoprolol tartrate 25 mg BID o Current hyperlipidemia regimen: atorvastatin 40 mg daily; last LDL at goal <100; 10 year ASCVD risk score 24%; could consider goal LDL <70  Pharmacist Clinical Goal(s):  Marland Kitchen Over the next 90 days, patient with work with PharmD and primary care provider to address optimized management of diabetes  Interventions: . Reviewed goal A1c, goal fasting glucose and goal post prandial glucose. Suspect improvement in A1c based on reported sugars.  . Discussed rule of 15. Provided these instructions in writing.  . Recommended patient start checking 2 hour post prandial glucoses after alternating meals to determine if mealtime insulin doses are appropriate. Provided with glucose log.  . Mailed Healthy Meal Planning handout  Patient Self Care Activities:  . Patient will check blood glucose BID, document, and provide at future appointments . Patient will take medications as prescribed . Patient will contact provider with any episodes of hypoglycemia . Patient will report any  questions or concerns to provider   Please see past updates related to this goal by clicking on the "Past Updates" button in the selected goal         Print copy of patient instructions provided.   Plan:  - Will meet with patient face to face at upcoming PCP appointment later this month  [place], PharmD Clinical Pharmacist [place] [phone]

## 2019-03-09 NOTE — Chronic Care Management (AMB) (Signed)
Chronic Care Management   Follow Up Note   03/09/2019 Name: Mary Parks MRN: [id number] DOB: 1950-11-14  Referred by: Venita Lick, NP Reason for referral : Chronic Care Management (Medication Management)   Mary Parks is a 68 y.o. year old female who is a primary care patient of Cannady, Barbaraann Faster, NP. The CCM team was consulted for assistance with chronic disease management and care coordination needs.    Contacted patient for medication management review today.   Review of patient status, including review of consultants reports, relevant laboratory and other test results, and collaboration with appropriate care team members and the patient's provider was performed as part of comprehensive patient evaluation and provision of chronic care management services.    SDOH (Social Determinants of Health) screening performed today: Physical Activity. See Care Plan for related entries.   Advanced Directives Status: N See Care Plan and Vynca application for related entries.  Outpatient Encounter Medications as of 03/09/2019  Medication Sig Note  . Dulaglutide (TRULICITY) 1.5 0000000 SOPN INJECT 1.5 (1 SYRINGE) INTO THE SKIN ONCE A WEEK   . Insulin Glargine (LANTUS SOLOSTAR) 100 UNIT/ML Solostar Pen Inject 40 Units into the skin 2 (two) times daily.   . insulin lispro (HUMALOG) 100 UNIT/ML KwikPen INJECT 20 UNITS SUBCUTANEOUSLY BEFORE BREAKFAST AND LUNCH, AND 30 UNITS BEFORE EVENING MEAL 03/09/2019: 6 breakfast; 10 lunch; 8 supper  . ONE TOUCH ULTRA TEST test strip 1 each by Other route 2 (two) times daily.  03/25/2015: Received from: External Pharmacy  . ONETOUCH DELICA LANCETS [id number] MISC 2 (two) times daily.  03/25/2015: Received from: External Pharmacy  . sitaGLIPtin (JANUVIA) 100 MG tablet Take 100 mg by mouth daily.   Marland Kitchen albuterol (PROVENTIL HFA;VENTOLIN HFA) 108 (90 Base) MCG/ACT inhaler Inhale 2 puffs into the lungs every 6 (six) hours as needed for wheezing or shortness of  breath. 10/24/2018: 3 times per week  . amLODipine (NORVASC) 2.5 MG tablet Take 2.5 mg by mouth daily.   Marland Kitchen aspirin EC 81 MG tablet Take 81 mg by mouth daily.   Marland Kitchen atorvastatin (LIPITOR) 40 MG tablet TAKE 1 TABLET BY MOUTH AT BEDTIME   . budesonide-formoterol (SYMBICORT) 160-4.5 MCG/ACT inhaler Inhale 2 puffs into the lungs 2 (two) times daily. 10/24/2018: Taking PRN  . calcium-vitamin D (OSCAL WITH D) 500-200 MG-UNIT tablet Take 1 tablet by mouth.   . cetirizine (ZYRTEC) 10 MG tablet Take 10 mg by mouth daily.   . fluticasone (FLONASE) 50 MCG/ACT nasal spray USE 2 SPRAY(S) IN EACH NOSTRIL ONCE DAILY   . levothyroxine (SYNTHROID, LEVOTHROID) 100 MCG tablet Take 100 mcg by mouth daily. 07/07/2015: Received from: External Pharmacy Received Sig:   . lisinopril (PRINIVIL,ZESTRIL) 20 MG tablet Take 1 tablet (20 mg total) by mouth daily.   . meclizine (ANTIVERT) 12.5 MG tablet Take 1 tablet (12.5 mg total) by mouth 3 (three) times daily as needed for dizziness.   . metoprolol tartrate (LOPRESSOR) 25 MG tablet Take 25 mg by mouth 2 (two) times daily.   Marland Kitchen omeprazole (PRILOSEC) 20 MG capsule Take 1 capsule (20 mg total) by mouth daily.    No facility-administered encounter medications on file as of 03/09/2019.      Goals Addressed            This Visit's Progress     Patient Stated   . PharmD "I want to get my sugars better controlled" (pt-stated)       Current Barriers:  . Diabetes: uncontrolled, most  recent A1c 8.4% on 11/29/2018  o Follows with endocrinology Dr. Rosario Jacks. Had appointment with him today. Notes that A1c was checked, she has not received results yet.  . Current antihyperglycemic regimen: Trulicity 1.5 mg weekly, Lantus 40 units BID, Humalog 6 units with breakfast, 10 units with lunch, 8 units with supper, Januvia 100 mg daily . Current exercise: Has resumed walking with her group of friends in the morning . Current blood glucose readings: fasting 100s; bedtime: 150-170s . Cardiovascular  risk reduction: o Current hypertensive regimen: amlodipine 2.5 mg daily, lisinopril 20 mg daily, metoprolol tartrate 25 mg BID o Current hyperlipidemia regimen: atorvastatin 40 mg daily; last LDL at goal <100; 10 year ASCVD risk score 24%; could consider goal LDL <70  Pharmacist Clinical Goal(s):  Marland Kitchen Over the next 90 days, patient with work with PharmD and primary care provider to address optimized management of diabetes  Interventions: . Reviewed goal A1c, goal fasting glucose and goal post prandial glucose. Suspect improvement in A1c based on reported sugars.  . Discussed rule of 15. Provided these instructions in writing.  . Recommended patient start checking 2 hour post prandial glucoses after alternating meals to determine if mealtime insulin doses are appropriate. Provided with glucose log.  . Mailed Healthy Meal Planning handout  Patient Self Care Activities:  . Patient will check blood glucose BID, document, and provide at future appointments . Patient will take medications as prescribed . Patient will contact provider with any episodes of hypoglycemia . Patient will report any questions or concerns to provider   Please see past updates related to this goal by clicking on the "Past Updates" button in the selected goal          Plan:  - Will meet with patient face to face at upcoming PCP appointment later this month  Catie Darnelle Maffucci, PharmD Clinical Pharmacist [place] [phone]

## 2019-03-14 DIAGNOSIS — E1165 Type 2 diabetes mellitus with hyperglycemia: Secondary | ICD-10-CM | POA: Diagnosis not present

## 2019-03-14 DIAGNOSIS — M899 Disorder of bone, unspecified: Secondary | ICD-10-CM | POA: Diagnosis not present

## 2019-03-16 ENCOUNTER — Ambulatory Visit: Payer: Self-pay | Admitting: Pharmacist

## 2019-03-16 NOTE — Chronic Care Management (AMB) (Signed)
  Chronic Care Management   Note  03/16/2019 Name: Mary Parks MRN: [id number] DOB: January 08, 1951  Mary Parks is a 68 y.o. year old female who is a primary care patient of Cannady, Barbaraann Faster, NP. The CCM team was consulted for assistance with chronic disease management and care coordination needs.    Patient called me to today - she received her A1c results from endocrinology; reports A1c is now 7.5%. She is extremely pleased, and excited to see Jolene Cannady next week.   Plan:  - Will meet with patient next week at appointment  Catie Darnelle Maffucci, PharmD Clinical Pharmacist [place] [phone]

## 2019-03-21 ENCOUNTER — Other Ambulatory Visit: Payer: Self-pay

## 2019-03-21 ENCOUNTER — Ambulatory Visit (INDEPENDENT_AMBULATORY_CARE_PROVIDER_SITE_OTHER): Payer: Medicare Other | Admitting: Nurse Practitioner

## 2019-03-21 ENCOUNTER — Encounter: Payer: Self-pay | Admitting: Nurse Practitioner

## 2019-03-21 VITALS — BP 128/83 | HR 82 | Temp 98.8°F

## 2019-03-21 DIAGNOSIS — N1831 Chronic kidney disease, stage 3a: Secondary | ICD-10-CM | POA: Diagnosis not present

## 2019-03-21 DIAGNOSIS — E1169 Type 2 diabetes mellitus with other specified complication: Secondary | ICD-10-CM | POA: Diagnosis not present

## 2019-03-21 DIAGNOSIS — E1140 Type 2 diabetes mellitus with diabetic neuropathy, unspecified: Secondary | ICD-10-CM | POA: Diagnosis not present

## 2019-03-21 DIAGNOSIS — E0821 Diabetes mellitus due to underlying condition with diabetic nephropathy: Secondary | ICD-10-CM | POA: Diagnosis not present

## 2019-03-21 DIAGNOSIS — I129 Hypertensive chronic kidney disease with stage 1 through stage 4 chronic kidney disease, or unspecified chronic kidney disease: Secondary | ICD-10-CM | POA: Diagnosis not present

## 2019-03-21 DIAGNOSIS — Z794 Long term (current) use of insulin: Secondary | ICD-10-CM

## 2019-03-21 DIAGNOSIS — R8271 Bacteriuria: Secondary | ICD-10-CM | POA: Diagnosis not present

## 2019-03-21 DIAGNOSIS — E785 Hyperlipidemia, unspecified: Secondary | ICD-10-CM

## 2019-03-21 DIAGNOSIS — B3731 Acute candidiasis of vulva and vagina: Secondary | ICD-10-CM

## 2019-03-21 DIAGNOSIS — E0822 Diabetes mellitus due to underlying condition with diabetic chronic kidney disease: Secondary | ICD-10-CM

## 2019-03-21 DIAGNOSIS — E1122 Type 2 diabetes mellitus with diabetic chronic kidney disease: Secondary | ICD-10-CM

## 2019-03-21 DIAGNOSIS — B373 Candidiasis of vulva and vagina: Secondary | ICD-10-CM

## 2019-03-21 LAB — WET PREP FOR TRICH, YEAST, CLUE
Clue Cell Exam: NEGATIVE
Trichomonas Exam: NEGATIVE
Yeast Exam: POSITIVE — AB

## 2019-03-21 MED ORDER — FLUCONAZOLE 150 MG PO TABS: 150.0000 mg | ORAL_TABLET | Freq: Once | ORAL | 0 refills | Status: AC

## 2019-03-21 MED ORDER — INSULIN LISPRO (1 UNIT DIAL) 100 UNIT/ML (KWIKPEN): PEN_INJECTOR | SUBCUTANEOUS | 3 refills | Status: DC

## 2019-03-21 MED ORDER — ATORVASTATIN CALCIUM 40 MG PO TABS: 40.0000 mg | ORAL_TABLET | Freq: Every day | ORAL | 3 refills | Status: DC

## 2019-03-21 NOTE — Patient Instructions (Signed)
Carbohydrate Counting for Diabetes Mellitus, Adult  Carbohydrate counting is a method of keeping track of how many carbohydrates you eat. Eating carbohydrates naturally increases the amount of sugar (glucose) in the blood. Counting how many carbohydrates you eat helps keep your blood glucose within normal limits, which helps you manage your diabetes (diabetes mellitus). It is important to know how many carbohydrates you can safely have in each meal. This is different for every person. A diet and nutrition specialist (registered dietitian) can help you make a meal plan and calculate how many carbohydrates you should have at each meal and snack. Carbohydrates are found in the following foods:  Grains, such as breads and cereals.  Dried beans and soy products.  Starchy vegetables, such as potatoes, peas, and corn.  Fruit and fruit juices.  Milk and yogurt.  Sweets and snack foods, such as cake, cookies, candy, chips, and soft drinks. How do I count carbohydrates? There are two ways to count carbohydrates in food. You can use either of the methods or a combination of both. Reading "Nutrition Facts" on packaged food The "Nutrition Facts" list is included on the labels of almost all packaged foods and beverages in the [place] It includes:  The serving size.  Information about nutrients in each serving, including the grams (g) of carbohydrate per serving. To use the "Nutrition Facts":  Decide how many servings you will have.  Multiply the number of servings by the number of carbohydrates per serving.  The resulting number is the total amount of carbohydrates that you will be having. Learning standard serving sizes of other foods When you eat carbohydrate foods that are not packaged or do not include "Nutrition Facts" on the label, you need to measure the servings in order to count the amount of carbohydrates:  Measure the foods that you will eat with a food scale or measuring cup, if needed.   Decide how many standard-size servings you will eat.  Multiply the number of servings by 15. Most carbohydrate-rich foods have about 15 g of carbohydrates per serving. ? For example, if you eat 8 oz (170 g) of strawberries, you will have eaten 2 servings and 30 g of carbohydrates (2 servings x 15 g = 30 g).  For foods that have more than one food mixed, such as soups and casseroles, you must count the carbohydrates in each food that is included. The following list contains standard serving sizes of common carbohydrate-rich foods. Each of these servings has about 15 g of carbohydrates:   hamburger bun or  English muffin.   oz (15 mL) syrup.   oz (14 g) jelly.  1 slice of bread.  1 six-inch tortilla.  3 oz (85 g) cooked rice or pasta.  4 oz (113 g) cooked dried beans.  4 oz (113 g) starchy vegetable, such as peas, corn, or potatoes.  4 oz (113 g) hot cereal.  4 oz (113 g) mashed potatoes or  of a large baked potato.  4 oz (113 g) canned or frozen fruit.  4 oz (120 mL) fruit juice.  4-6 crackers.  6 chicken nuggets.  6 oz (170 g) unsweetened dry cereal.  6 oz (170 g) plain fat-free yogurt or yogurt sweetened with artificial sweeteners.  8 oz (240 mL) milk.  8 oz (170 g) fresh fruit or one small piece of fruit.  24 oz (680 g) popped popcorn. Example of carbohydrate counting Sample meal  3 oz (85 g) chicken breast.  6 oz (170 g)   brown rice.  4 oz (113 g) corn.  8 oz (240 mL) milk.  8 oz (170 g) strawberries with sugar-free whipped topping. Carbohydrate calculation 1. Identify the foods that contain carbohydrates: ? Rice. ? Corn. ? Milk. ? Strawberries. 2. Calculate how many servings you have of each food: ? 2 servings rice. ? 1 serving corn. ? 1 serving milk. ? 1 serving strawberries. 3. Multiply each number of servings by 15 g: ? 2 servings rice x 15 g = 30 g. ? 1 serving corn x 15 g = 15 g. ? 1 serving milk x 15 g = 15 g. ? 1 serving  strawberries x 15 g = 15 g. 4. Add together all of the amounts to find the total grams of carbohydrates eaten: ? 30 g + 15 g + 15 g + 15 g = 75 g of carbohydrates total. Summary  Carbohydrate counting is a method of keeping track of how many carbohydrates you eat.  Eating carbohydrates naturally increases the amount of sugar (glucose) in the blood.  Counting how many carbohydrates you eat helps keep your blood glucose within normal limits, which helps you manage your diabetes.  A diet and nutrition specialist (registered dietitian) can help you make a meal plan and calculate how many carbohydrates you should have at each meal and snack. This information is not intended to replace advice given to you by your health care provider. Make sure you discuss any questions you have with your health care provider. Document Released: 05/10/2005 Document Revised: 12/02/2016 Document Reviewed: 10/22/2015 Elsevier Patient Education  2020 Elsevier Inc.  

## 2019-03-21 NOTE — Assessment & Plan Note (Signed)
Chronic, ongoing.  A1C with endo last week 7.5%, praised for her success and focus on diet.  Continue current medication regimen and collaboration with endocrinology and nephrology.  CMP today, continue Lisinopril for kidney protection.

## 2019-03-21 NOTE — Assessment & Plan Note (Signed)
Chronic, ongoing.  Continue current medication regimen and adjust as needed.  Lipid and CMP today. 

## 2019-03-21 NOTE — Assessment & Plan Note (Signed)
Chronic, stable with BP at goal.  Continue current medication regimen, Lisinopril for kidney protection with diabetes and collaboration with [facility] nephrology.  CMP today.

## 2019-03-21 NOTE — Progress Notes (Addendum)
BP 128/83   Pulse 82   Temp 98.8 F (37.1 C) (Oral)   LMP  (LMP Unknown)   SpO2 98%    Subjective:    Patient ID: Mary Parks, female    DOB: 1951/05/21, 68 y.o.   MRN: [id number]  HPI: [name] is a 68 y.o. female  Chief Complaint  Patient presents with  . Diabetes  . Hyperlipidemia  . Hypertension  . Vaginal Itching    pt states she has had vaginal itching for 4 to 5 days    DIABETES Followed by Dr. Rosario Jacks, last A1C 7.5% this was last week. Last visit 03/16/19 with endo, goes back to see him January. Currently taking Trulicity, Januvia, and Lantus (40 units twice daily) and Humalog 6 in morning, 10 lunch, and 8 in evening. Review of chart over past year A1C has remained in 8-9 range. Pt endorses improvement in diet, eating less sweets. Hypoglycemic episodes: had one episode of 66 in morning Polydipsia/polyuria: no Visual disturbance: no Chest pain: no Paresthesias: no Glucose Monitoring: yes             Accucheck frequency: BID             Fasting glucose: 100-120, this morning it was 140             Post prandial:             Evening: 150-200             Before meals: Taking Insulin?: yes             Long acting insulin: 40 units Lantus BID             Short acting insulin: Humalog 6 in morning, 10 lunch, and 8 in evening Blood Pressure Monitoring: a few times a week Retinal Examination: Not Up To Date Foot Exam: Up to Date Pneumovax: Up to Date Influenza: Up to Date Aspirin: yes  HYPERTENSION / HYPERLIPIDEMIA Current medications include Metoprolol, Lisinopril, and Amlodipine for HTN + Lipitor for HLD.  Sees nephrology at [facility], last saw in August and no changes made. Satisfied with current treatment? yes Duration of hypertension: chronic BP monitoring frequency: a few times a week BP range: 120-130/80's BP medication side effects: no Duration of hyperlipidemia: chronic Cholesterol medication side effects: no Cholesterol supplements: none  Medication compliance: good compliance Aspirin: yes Recent stressors: no Recurrent headaches: no Visual changes: no Palpitations: no Dyspnea: no Chest pain: no Lower extremity edema: no Dizzy/lightheaded: no   VAGINAL DISCHARGE Started about one week ago. Duration: weeks Discharge description: none  Pruritus: yes Dysuria: no Malodorous: no Urinary frequency: no Fevers: no Abdominal pain: suprapubic  Sexual activity: not concerned History of sexually transmitted diseases: no Recent antibiotic use: no Context: stable  Treatments attempted: Vagisil  Relevant past medical, surgical, family and social history reviewed and updated as indicated. Interim medical history since our last visit reviewed. Allergies and medications reviewed and updated.  Review of Systems  Constitutional: Negative for activity change, appetite change, diaphoresis, fatigue and fever.  Respiratory: Negative for cough, chest tightness and shortness of breath.   Cardiovascular: Negative for chest pain, palpitations and leg swelling.  Gastrointestinal: Negative for abdominal distention, abdominal pain, constipation, diarrhea, nausea and vomiting.  Endocrine: Negative for cold intolerance, heat intolerance, polydipsia, polyphagia and polyuria.  Neurological: Negative for dizziness, syncope, weakness, light-headedness, numbness and headaches.  Psychiatric/Behavioral: Negative.     Per HPI unless specifically indicated above  Objective:    BP 128/83   Pulse 82   Temp 98.8 F (37.1 C) (Oral)   LMP  (LMP Unknown)   SpO2 98%   Wt Readings from Last 3 Encounters:  12/25/18 182 lb (82.6 kg)  11/29/18 181 lb (82.1 kg)  06/02/18 180 lb (81.6 kg)    Physical Exam Vitals signs and nursing note reviewed.  Constitutional:      Appearance: She is well-developed.  HENT:     Head: Normocephalic.  Eyes:     General:        Right eye: No discharge.        Left eye: No discharge.      Conjunctiva/sclera: Conjunctivae normal.     Pupils: Pupils are equal, round, and reactive to light.  Neck:     Musculoskeletal: Normal range of motion and neck supple.     Thyroid: No thyromegaly.     Vascular: No carotid bruit or JVD.  Cardiovascular:     Rate and Rhythm: Normal rate and regular rhythm.     Heart sounds: Normal heart sounds.  Pulmonary:     Effort: Pulmonary effort is normal.     Breath sounds: Normal breath sounds.  Abdominal:     General: Bowel sounds are normal.     Palpations: Abdomen is soft.  Genitourinary:    Comments: Deferred per patient request, self swab done. Musculoskeletal:     Right lower leg: No edema.     Left lower leg: No edema.  Lymphadenopathy:     Cervical: No cervical adenopathy.  Skin:    General: Skin is warm and dry.  Neurological:     Mental Status: She is alert and oriented to person, place, and time.  Psychiatric:        Mood and Affect: Mood normal.        Behavior: Behavior normal.        Thought Content: Thought content normal.        Judgment: Judgment normal.    Diabetic Foot Exam - Simple   Simple Foot Form Visual Inspection No deformities, no ulcerations, no other skin breakdown bilaterally: Yes Sensation Testing See comments: Yes Pulse Check Posterior Tibialis and Dorsalis pulse intact bilaterally: Yes Comments Right foot 6/10 and left foot sensation 7/10     Results for orders placed or performed in visit on 11/29/18  Bayer DCA Hb A1c Waived  Result Value Ref Range   HB A1C (BAYER DCA - WAIVED) 8.4 (H) <7.0 %  Thyroid Panel With TSH  Result Value Ref Range   TSH 0.333 (L) 0.450 - 4.500 uIU/mL   T4, Total 10.2 4.5 - 12.0 ug/dL   T3 Uptake Ratio 29 24 - 39 %   Free Thyroxine Index 3.0 1.2 - 4.9  Microalbumin, Urine Waived  Result Value Ref Range   Microalb, Ur Waived 150 (H) 0 - 19 mg/L   Creatinine, Urine Waived 200 10 - 300 mg/dL   Microalb/Creat Ratio 30-300 (H) <30 mg/g  Comprehensive metabolic  panel  Result Value Ref Range   Glucose 214 (H) 65 - 99 mg/dL   BUN 22 8 - 27 mg/dL   Creatinine, Ser 1.13 (H) 0.57 - 1.00 mg/dL   GFR calc non Af Amer 49 (L) >59 mL/min/1.73   GFR calc Af Amer 57 (L) >59 mL/min/1.73   BUN/Creatinine Ratio 19 12 - 28   Sodium 145 (H) 134 - 144 mmol/L   Potassium 4.2 3.5 - 5.2 mmol/L  Chloride 105 96 - 106 mmol/L   CO2 22 20 - 29 mmol/L   Calcium 9.8 8.7 - 10.3 mg/dL   Total Protein 6.6 6.0 - 8.5 g/dL   Albumin 4.3 3.8 - 4.8 g/dL   Globulin, Total 2.3 1.5 - 4.5 g/dL   Albumin/Globulin Ratio 1.9 1.2 - 2.2   Bilirubin Total 0.3 0.0 - 1.2 mg/dL   Alkaline Phosphatase 93 39 - 117 IU/L   AST 23 0 - 40 IU/L   ALT 21 0 - 32 IU/L  Lipid Panel w/o Chol/HDL Ratio  Result Value Ref Range   Cholesterol, Total 157 100 - 199 mg/dL   Triglycerides 125 0 - 149 mg/dL   HDL 42 >39 mg/dL   VLDL Cholesterol Cal 25 5 - 40 mg/dL   LDL Calculated 90 0 - 99 mg/dL      Assessment & Plan:   Problem List Items Addressed This Visit      Cardiovascular and Mediastinum   Hypertension associated with chronic kidney disease due to type 2 diabetes mellitus ([place])    Chronic, stable with BP at goal.  Continue current medication regimen, Lisinopril for kidney protection with diabetes and collaboration with [facility] nephrology.  CMP today.      Relevant Medications   atorvastatin (LIPITOR) 40 MG tablet   insulin lispro (HUMALOG) 100 UNIT/ML KwikPen     Endocrine   Diabetes mellitus with chronic kidney disease (HCC) - Primary    Chronic, ongoing.  A1C with endo last week 7.5%, praised for her success and focus on diet.  Continue current medication regimen and collaboration with endocrinology and nephrology.  CMP today, continue Lisinopril for kidney protection.      Relevant Medications   atorvastatin (LIPITOR) 40 MG tablet   insulin lispro (HUMALOG) 100 UNIT/ML KwikPen   Other Relevant Orders   Comprehensive metabolic panel   Lipid Panel w/o Chol/HDL Ratio   Type 2  diabetes mellitus with diabetic neuropathy, with long-term current use of insulin (HCC)    Chronic, ongoing.  A1C with endo last week 7.5%, praised for her success.  Continue current medication regimen and collaboration with endocrinology.  CMP today, continue Lisinopril for kidney protection.      Relevant Medications   atorvastatin (LIPITOR) 40 MG tablet   insulin lispro (HUMALOG) 100 UNIT/ML KwikPen   Hyperlipidemia associated with type 2 diabetes mellitus (HCC)    Chronic, ongoing.  Continue current medication regimen and adjust as needed.  Lipid and CMP today.         Relevant Medications   atorvastatin (LIPITOR) 40 MG tablet   insulin lispro (HUMALOG) 100 UNIT/ML KwikPen    Other Visit Diagnoses    Vaginal yeast infection       Script for Diflucan sent and advised to hold statin for 24 to 48 hours after taking Diflucan to avoid interaction.  Return if worsening or continued.   Relevant Medications   fluconazole (DIFLUCAN) 150 MG tablet   Other Relevant Orders   WET PREP FOR [employer], YEAST, CLUE   UA/M w/rflx Culture, Routine       Follow up plan: Return in about 3 months (around 06/21/2019) for T2DM, HTN/HLD, CKD.

## 2019-03-21 NOTE — Assessment & Plan Note (Signed)
Chronic, ongoing.  A1C with endo last week 7.5%, praised for her success.  Continue current medication regimen and collaboration with endocrinology.  CMP today, continue Lisinopril for kidney protection.

## 2019-03-22 LAB — COMPREHENSIVE METABOLIC PANEL
ALT: 19 IU/L (ref 0–32)
AST: 20 IU/L (ref 0–40)
Albumin/Globulin Ratio: 1.6 (ref 1.2–2.2)
Albumin: 4.2 g/dL (ref 3.8–4.8)
Alkaline Phosphatase: 101 IU/L (ref 39–117)
BUN/Creatinine Ratio: 15 (ref 12–28)
BUN: 17 mg/dL (ref 8–27)
Bilirubin Total: 0.3 mg/dL (ref 0.0–1.2)
CO2: 24 mmol/L (ref 20–29)
Calcium: 9.8 mg/dL (ref 8.7–10.3)
Chloride: 105 mmol/L (ref 96–106)
Creatinine, Ser: 1.12 mg/dL — ABNORMAL HIGH (ref 0.57–1.00)
GFR calc Af Amer: 58 mL/min/{1.73_m2} — ABNORMAL LOW (ref >59)
GFR calc non Af Amer: 50 mL/min/{1.73_m2} — ABNORMAL LOW (ref >59)
Globulin, Total: 2.7 g/dL (ref 1.5–4.5)
Glucose: 186 mg/dL — ABNORMAL HIGH (ref 65–99)
Potassium: 4.4 mmol/L (ref 3.5–5.2)
Sodium: 143 mmol/L (ref 134–144)
Total Protein: 6.9 g/dL (ref 6.0–8.5)

## 2019-03-22 LAB — LIPID PANEL W/O CHOL/HDL RATIO
Cholesterol, Total: 151 mg/dL (ref 100–199)
HDL: 44 mg/dL (ref >39)
LDL Chol Calc (NIH): 86 mg/dL (ref 0–99)
Triglycerides: 114 mg/dL (ref 0–149)
VLDL Cholesterol Cal: 21 mg/dL (ref 5–40)

## 2019-03-23 LAB — UA/M W/RFLX CULTURE, ROUTINE
Bilirubin, UA: NEGATIVE
Leukocytes,UA: NEGATIVE
Nitrite, UA: NEGATIVE
Specific Gravity, UA: >1.03 — ABNORMAL HIGH (ref 1.005–1.030)
Urobilinogen, Ur: 0.2 mg/dL (ref 0.2–1.0)
pH, UA: 5 (ref 5.0–7.5)

## 2019-03-23 LAB — URINE CULTURE, REFLEX

## 2019-03-23 LAB — MICROSCOPIC EXAMINATION

## 2019-03-27 ENCOUNTER — Other Ambulatory Visit: Payer: Self-pay | Admitting: Nurse Practitioner

## 2019-05-21 ENCOUNTER — Other Ambulatory Visit: Payer: Self-pay | Admitting: Nurse Practitioner

## 2019-05-28 ENCOUNTER — Other Ambulatory Visit: Payer: Self-pay | Admitting: Nurse Practitioner

## 2019-06-05 ENCOUNTER — Ambulatory Visit
Admission: EM | Admit: 2019-06-05 | Discharge: 2019-06-05 | Disposition: A | Discharge status: Home / Self Care | Payer: Medicare Other | Attending: Family Medicine | Admitting: Family Medicine

## 2019-06-05 ENCOUNTER — Other Ambulatory Visit: Payer: Self-pay

## 2019-06-05 DIAGNOSIS — M545 Low back pain: Secondary | ICD-10-CM

## 2019-06-05 DIAGNOSIS — M549 Dorsalgia, unspecified: Secondary | ICD-10-CM | POA: Diagnosis not present

## 2019-06-05 DIAGNOSIS — R300 Dysuria: Secondary | ICD-10-CM | POA: Diagnosis not present

## 2019-06-05 LAB — URINALYSIS, COMPLETE (UACMP) WITH MICROSCOPIC
Bilirubin Urine: NEGATIVE
Glucose, UA: NEGATIVE mg/dL
Hgb urine dipstick: NEGATIVE
Ketones, ur: NEGATIVE mg/dL
Leukocytes,Ua: NEGATIVE
Nitrite: NEGATIVE
Protein, ur: 100 mg/dL — AB
RBC / HPF: NONE SEEN RBC/hpf (ref 0–5)
Specific Gravity, Urine: 1.025 (ref 1.005–1.030)
pH: 5.5 (ref 5.0–8.0)

## 2019-06-05 MED ORDER — FLUCONAZOLE 150 MG PO TABS: 150.0000 mg | ORAL_TABLET | Freq: Every day | ORAL | 0 refills | Status: DC

## 2019-06-05 MED ORDER — CEPHALEXIN 500 MG PO CAPS: 500.0000 mg | ORAL_CAPSULE | Freq: Two times a day (BID) | ORAL | 0 refills | Status: AC

## 2019-06-05 NOTE — ED Provider Notes (Signed)
[facility] ____________________________________________  Time seen: Approximately 10:02 AM  I have reviewed the triage vital signs and the nursing notes.   HISTORY  Chief Complaint Back Pain and Urinary Frequency   HPI Mary Parks is a 69 y.o. female presenting for evaluation of low back pain and urinary changes.  Patient reports this past Friday she lifted 2 cases of water multiple times in the house, and noticed some back pain later that night.  Also reports over the same timeframe this week and she notes urinary frequency, urinary urgency and some discomfort with urination.  Reports suprapubic pressure.  Occasional vaginal itching.  Denies vaginal discharge.  Denies fall or direct injury to her back.  Denies abdominal pain, vomiting, diarrhea, fevers or recent sickness.  Denies recent antibiotic use.  Denies other aggravating alleviating factors.  Reports otherwise doing well.  Venita Lick, NP : PCP   Past Medical History:  Diagnosis Date  . Asthma   . Chest pain   . DM2 (diabetes mellitus, type 2) ([place]) 1987  . Dyspnea    Cath normal coronaries. normal EF. RA 11. PA 37/17 (26) LVEDP 36  . GERD (gastroesophageal reflux disease)   . HTN (hypertension)   . Hyperlipidemia   . Obesity   . Osteoarthritis    knees  . Renal disease   . Thyroid disease    having thyroid removed 04/14/15  . Vertigo    none recently    Patient Active Problem List   Diagnosis Date Noted  . Hyperlipidemia associated with type 2 diabetes mellitus ([place]) 06/29/2017  . Benign neoplasm of cecum   . Benign neoplasm of ascending colon   . Benign neoplasm of descending colon   . Allergic rhinitis 09/22/2016  . OA (osteoarthritis) of knee 06/25/2016  . Type 2 diabetes mellitus with diabetic neuropathy, with long-term current use of insulin ([place]) 10/03/2015  . S/P total thyroidectomy 04/14/2015  . Chronic kidney disease, stage 3 12/17/2014  . Hypertension associated with  chronic kidney disease due to type 2 diabetes mellitus ([place]) 12/17/2014  . Diabetes mellitus with chronic kidney disease ([place]) 11/28/2014    Past Surgical History:  Procedure Laterality Date  . CARDIAC CATHETERIZATION    . CATARACT EXTRACTION W/PHACO Right 03/31/2015   Procedure: CATARACT EXTRACTION PHACO AND INTRAOCULAR LENS PLACEMENT (IOC);  Surgeon: Ronnell Freshwater, MD;  Location: [place];  Service: Ophthalmology;  Laterality: Right;  DIABETIC - insulin  . COLONOSCOPY WITH PROPOFOL N/A 11/12/2016   Procedure: COLONOSCOPY WITH PROPOFOL;  Surgeon: [name], MD;  Location: [place];  Service: Endoscopy;  Laterality: N/A;  Diabetic - insulin  . EYE SURGERY    . HERNIA REPAIR     umbilical  . POLYPECTOMY  11/12/2016   Procedure: POLYPECTOMY INTESTINAL;  Surgeon: [name], MD;  Location: [place];  Service: Endoscopy;;  . THYROIDECTOMY N/A 04/14/2015   Procedure: THYROIDECTOMY;  Surgeon: Margaretha Sheffield, MD;  Location: [facility];  Service: ENT;  Laterality: N/A;     No current facility-administered medications for this encounter.  Current Outpatient Medications:  .  albuterol (PROVENTIL HFA;VENTOLIN HFA) 108 (90 Base) MCG/ACT inhaler, Inhale 2 puffs into the lungs every 6 (six) hours as needed for wheezing or shortness of breath., Disp: 1 Inhaler, Rfl: 2 .  amLODipine (NORVASC) 2.5 MG tablet, Take 2.5 mg by mouth daily., Disp: , Rfl:  .  aspirin EC 81 MG tablet, Take 81 mg by mouth daily., Disp: , Rfl:  .  atorvastatin (LIPITOR) 40 MG tablet, Take 1 tablet (40 mg total) by mouth at bedtime., Disp: 90 tablet, Rfl: 3 .  budesonide-formoterol (SYMBICORT) 160-4.5 MCG/ACT inhaler, Inhale 2 puffs into the lungs 2 (two) times daily., Disp: 3 Inhaler, Rfl: 4 .  calcium-vitamin D (OSCAL WITH D) 500-200 MG-UNIT tablet, Take 1 tablet by mouth., Disp: , Rfl:  .  cetirizine (ZYRTEC) 10 MG tablet, Take 10 mg by mouth daily., Disp: , Rfl:  .  fluticasone  (FLONASE) 50 MCG/ACT nasal spray, USE 2 SPRAY(S) IN EACH NOSTRIL ONCE DAILY, Disp: 16 g, Rfl: 3 .  insulin lispro (HUMALOG) 100 UNIT/ML KwikPen, INJECT 20 UNITS SUBCUTANEOUSLY BEFORE BREAKFAST AND LUNCH, AND 30 UNITS BEFORE EVENING MEAL, Disp: 15 mL, Rfl: 3 .  LANTUS SOLOSTAR 100 UNIT/ML Solostar Pen, INJECT 40 UNITS SUBCUTANEOUSLY TWICE DAILY, Disp: 30 mL, Rfl: 0 .  levothyroxine (SYNTHROID, LEVOTHROID) 100 MCG tablet, Take 100 mcg by mouth daily., Disp: , Rfl:  .  lisinopril (ZESTRIL) 20 MG tablet, Take 1 tablet by mouth once daily, Disp: 90 tablet, Rfl: 0 .  meclizine (ANTIVERT) 12.5 MG tablet, Take 1 tablet (12.5 mg total) by mouth 3 (three) times daily as needed for dizziness., Disp: 30 tablet, Rfl: 0 .  metoprolol tartrate (LOPRESSOR) 25 MG tablet, Take 25 mg by mouth 2 (two) times daily., Disp: , Rfl:  .  omeprazole (PRILOSEC) 20 MG capsule, Take 1 capsule (20 mg total) by mouth daily., Disp: 30 capsule, Rfl: 12 .  ONE TOUCH ULTRA TEST test strip, 1 each by Other route 2 (two) times daily. , Disp: , Rfl:  .  ONETOUCH DELICA LANCETS 99991111 MISC, 2 (two) times daily. , Disp: , Rfl:  .  sitaGLIPtin (JANUVIA) 100 MG tablet, Take 100 mg by mouth daily., Disp: , Rfl:  .  TRULICITY 1.5 0000000 SOPN, INJECT 1.5 INTO THE SKIN ONCE A WEEK, Disp: 8 mL, Rfl: 0 .  cephALEXin (KEFLEX) 500 MG capsule, Take 1 capsule (500 mg total) by mouth 2 (two) times daily for 7 days., Disp: 14 capsule, Rfl: 0 .  fluconazole (DIFLUCAN) 150 MG tablet, Take 1 tablet (150 mg total) by mouth daily. Take one pill orally, then Repeat in one week as needed., Disp: 2 tablet, Rfl: 0  Allergies Celebrex [celecoxib], Sulfa antibiotics, Sulfisoxazole, and Sulfur  Family History  Problem Relation Age of Onset  . Diabetes Mother   . Diabetes Father   . Diabetes Sister   . Diabetes Brother   . Diabetes Sister   . Diabetes Sister   . Diabetes Maternal Grandmother   . Diabetes Maternal Grandfather   . Diabetes Paternal  Grandmother   . Diabetes Paternal Grandfather   . Heart disease Neg Hx   . Coronary artery disease Neg Hx   . Breast cancer Neg Hx     Social History Social History   Tobacco Use  . Smoking status: Never Smoker  . Smokeless tobacco: Never Used  Substance Use Topics  . Alcohol use: No  . Drug use: No    Review of Systems Constitutional: No fever. ENT: No sore throat. Cardiovascular: Denies chest pain. Respiratory: Denies shortness of breath. Gastrointestinal:   No nausea, no vomiting.  No diarrhea.   Genitourinary: Positive for dysuria. Musculoskeletal: Positive for back pain. Skin: Negative for rash. ____________________________________________   PHYSICAL EXAM:  VITAL SIGNS: ED Triage Vitals  Enc Vitals Group     BP 06/05/19 0925 (!) 141/92     Pulse Rate 06/05/19 0925 91  Resp --      Temp 06/05/19 0925 98.4 F (36.9 C)     Temp Source 06/05/19 0925 Oral     SpO2 06/05/19 0925 100 %     Weight 06/05/19 0923 182 lb (82.6 kg)     Height 06/05/19 0923 5' 4.5" (1.638 m)     Head Circumference --      Peak Flow --      Pain Score 06/05/19 0923 8     Pain Loc --      Pain Edu? --      Excl. in [place]? --     Constitutional: Alert and oriented. Well appearing and in no acute distress. Eyes: Conjunctivae are normal.  ENT      Head: Normocephalic and atraumatic. Cardiovascular: Normal rate, regular rhythm. Grossly normal heart sounds.  Good peripheral circulation. Respiratory: Normal respiratory effort without tachypnea nor retractions. Breath sounds are clear and equal bilaterally. No wheezes, rales, rhonchi. Gastrointestinal: Minimal midline suprapubic tenderness to palpation.  Abdomen otherwise soft and nontender.  No CVA tenderness. Musculoskeletal: Steady gait.  No midline cervical, thoracic or lumbar tenderness to palpation.  Changes positions quickly.  Mild lumbar tenderness with lumbar flexion and extension as well as rotation, no point tenderness.  Neurologic:  Normal speech and language.Speech is normal. No gait instability.  Skin:  Skin is warm, dry and intact. No rash noted. Psychiatric: Mood and affect are normal. Speech and behavior are normal. Patient exhibits appropriate insight and judgment   ___________________________________________   LABS (all labs ordered are listed, but only abnormal results are displayed)  Labs Reviewed  URINALYSIS, COMPLETE (UACMP) WITH MICROSCOPIC - Abnormal; Notable for the following components:      Result Value   APPearance HAZY (*)    Protein, ur 100 (*)    Bacteria, UA FEW (*)    All other components within normal limits  URINE CULTURE    Most recent labs reviewed, CMP from 03/21/2019 reviewed, creatinine 1.12.  Creatinine clearance calculated at 61. ____________________________________________   PROCEDURES Procedures    INITIAL IMPRESSION / ASSESSMENT AND PLAN / ED COURSE  Pertinent labs & imaging results that were available during my care of the patient were reviewed by me and considered in my medical decision making (see chart for details).  Well-appearing patient.  No acute distress.  Low back pain and dysuria.  Suspect lumbar strain as well as concern for UTI post urinalysis reviewed.  Yeast also noted.  Will treat with oral Keflex and Diflucan.  Tylenol.  Stretch.  Ice heat.  Supportive care. Discussed indication, risks and benefits of medications with patient. Discussed follow up and return parameters including no resolution or any worsening concerns. Patient verbalized understanding and agreed to plan.   ____________________________________________   FINAL CLINICAL IMPRESSION(S) / ED DIAGNOSES  Final diagnoses:  Dysuria  Acute back pain, unspecified back location, unspecified back pain laterality     ED Discharge Orders         Ordered    cephALEXin (KEFLEX) 500 MG capsule  2 times daily     06/05/19 1018    fluconazole (DIFLUCAN) 150 MG tablet  Daily     06/05/19  1018           Note: This dictation was prepared with Dragon dictation along with smaller phrase technology. Any transcriptional errors that result from this process are unintentional.         Marylene Land, NP 06/05/19 1141

## 2019-06-05 NOTE — Discharge Instructions (Addendum)
Take medication as prescribed. Rest. Drink plenty of fluids.  ° °Follow up with your primary care physician this week as needed. Return to Urgent care for new or worsening concerns.  ° °

## 2019-06-05 NOTE — ED Triage Notes (Signed)
Pt presents with c/o LBP that radiates from side to side, she reports an increase in pain when she lays on her side. She also reports some burning with urination and some increased urgency. She denies hematuria, abd pain, f/n/v. She states the pain has been increasing since Friday, she had lifted a large case of water that day.

## 2019-06-06 LAB — URINE CULTURE

## 2019-06-08 DIAGNOSIS — I10 Essential (primary) hypertension: Secondary | ICD-10-CM | POA: Diagnosis not present

## 2019-06-08 DIAGNOSIS — E119 Type 2 diabetes mellitus without complications: Secondary | ICD-10-CM | POA: Diagnosis not present

## 2019-06-08 DIAGNOSIS — E039 Hypothyroidism, unspecified: Secondary | ICD-10-CM | POA: Diagnosis not present

## 2019-06-08 DIAGNOSIS — E1129 Type 2 diabetes mellitus with other diabetic kidney complication: Secondary | ICD-10-CM | POA: Diagnosis not present

## 2019-06-08 DIAGNOSIS — E785 Hyperlipidemia, unspecified: Secondary | ICD-10-CM | POA: Diagnosis not present

## 2019-06-08 DIAGNOSIS — E559 Vitamin D deficiency, unspecified: Secondary | ICD-10-CM | POA: Diagnosis not present

## 2019-06-22 ENCOUNTER — Ambulatory Visit (INDEPENDENT_AMBULATORY_CARE_PROVIDER_SITE_OTHER): Payer: Medicare Other | Admitting: Nurse Practitioner

## 2019-06-22 ENCOUNTER — Encounter: Payer: Self-pay | Admitting: Nurse Practitioner

## 2019-06-22 VITALS — Ht 64.5 in | Wt 183.0 lb

## 2019-06-22 DIAGNOSIS — E0821 Diabetes mellitus due to underlying condition with diabetic nephropathy: Secondary | ICD-10-CM | POA: Diagnosis not present

## 2019-06-22 DIAGNOSIS — E890 Postprocedural hypothyroidism: Secondary | ICD-10-CM

## 2019-06-22 DIAGNOSIS — E785 Hyperlipidemia, unspecified: Secondary | ICD-10-CM

## 2019-06-22 DIAGNOSIS — E1140 Type 2 diabetes mellitus with diabetic neuropathy, unspecified: Secondary | ICD-10-CM

## 2019-06-22 DIAGNOSIS — I129 Hypertensive chronic kidney disease with stage 1 through stage 4 chronic kidney disease, or unspecified chronic kidney disease: Secondary | ICD-10-CM

## 2019-06-22 DIAGNOSIS — E1169 Type 2 diabetes mellitus with other specified complication: Secondary | ICD-10-CM | POA: Diagnosis not present

## 2019-06-22 DIAGNOSIS — E1122 Type 2 diabetes mellitus with diabetic chronic kidney disease: Secondary | ICD-10-CM | POA: Diagnosis not present

## 2019-06-22 DIAGNOSIS — N1831 Chronic kidney disease, stage 3a: Secondary | ICD-10-CM | POA: Diagnosis not present

## 2019-06-22 DIAGNOSIS — Z794 Long term (current) use of insulin: Secondary | ICD-10-CM

## 2019-06-22 NOTE — Patient Instructions (Signed)
Carbohydrate Counting for Diabetes Mellitus, Adult  Carbohydrate counting is a method of keeping track of how many carbohydrates you eat. Eating carbohydrates naturally increases the amount of sugar (glucose) in the blood. Counting how many carbohydrates you eat helps keep your blood glucose within normal limits, which helps you manage your diabetes (diabetes mellitus). It is important to know how many carbohydrates you can safely have in each meal. This is different for every person. A diet and nutrition specialist (registered dietitian) can help you make a meal plan and calculate how many carbohydrates you should have at each meal and snack. Carbohydrates are found in the following foods:  Grains, such as breads and cereals.  Dried beans and soy products.  Starchy vegetables, such as potatoes, peas, and corn.  Fruit and fruit juices.  Milk and yogurt.  Sweets and snack foods, such as cake, cookies, candy, chips, and soft drinks. How do I count carbohydrates? There are two ways to count carbohydrates in food. You can use either of the methods or a combination of both. Reading "Nutrition Facts" on packaged food The "Nutrition Facts" list is included on the labels of almost all packaged foods and beverages in the [place] It includes:  The serving size.  Information about nutrients in each serving, including the grams (g) of carbohydrate per serving. To use the "Nutrition Facts":  Decide how many servings you will have.  Multiply the number of servings by the number of carbohydrates per serving.  The resulting number is the total amount of carbohydrates that you will be having. Learning standard serving sizes of other foods When you eat carbohydrate foods that are not packaged or do not include "Nutrition Facts" on the label, you need to measure the servings in order to count the amount of carbohydrates:  Measure the foods that you will eat with a food scale or measuring cup, if  needed.  Decide how many standard-size servings you will eat.  Multiply the number of servings by 15. Most carbohydrate-rich foods have about 15 g of carbohydrates per serving. ? For example, if you eat 8 oz (170 g) of strawberries, you will have eaten 2 servings and 30 g of carbohydrates (2 servings x 15 g = 30 g).  For foods that have more than one food mixed, such as soups and casseroles, you must count the carbohydrates in each food that is included. The following list contains standard serving sizes of common carbohydrate-rich foods. Each of these servings has about 15 g of carbohydrates:   hamburger bun or  English muffin.   oz (15 mL) syrup.   oz (14 g) jelly.  1 slice of bread.  1 six-inch tortilla.  3 oz (85 g) cooked rice or pasta.  4 oz (113 g) cooked dried beans.  4 oz (113 g) starchy vegetable, such as peas, corn, or potatoes.  4 oz (113 g) hot cereal.  4 oz (113 g) mashed potatoes or  of a large baked potato.  4 oz (113 g) canned or frozen fruit.  4 oz (120 mL) fruit juice.  4-6 crackers.  6 chicken nuggets.  6 oz (170 g) unsweetened dry cereal.  6 oz (170 g) plain fat-free yogurt or yogurt sweetened with artificial sweeteners.  8 oz (240 mL) milk.  8 oz (170 g) fresh fruit or one small piece of fruit.  24 oz (680 g) popped popcorn. Example of carbohydrate counting Sample meal  3 oz (85 g) chicken breast.  6 oz (170 g)   brown rice.  4 oz (113 g) corn.  8 oz (240 mL) milk.  8 oz (170 g) strawberries with sugar-free whipped topping. Carbohydrate calculation 1. Identify the foods that contain carbohydrates: ? Rice. ? Corn. ? Milk. ? Strawberries. 2. Calculate how many servings you have of each food: ? 2 servings rice. ? 1 serving corn. ? 1 serving milk. ? 1 serving strawberries. 3. Multiply each number of servings by 15 g: ? 2 servings rice x 15 g = 30 g. ? 1 serving corn x 15 g = 15 g. ? 1 serving milk x 15 g = 15 g. ? 1  serving strawberries x 15 g = 15 g. 4. Add together all of the amounts to find the total grams of carbohydrates eaten: ? 30 g + 15 g + 15 g + 15 g = 75 g of carbohydrates total. Summary  Carbohydrate counting is a method of keeping track of how many carbohydrates you eat.  Eating carbohydrates naturally increases the amount of sugar (glucose) in the blood.  Counting how many carbohydrates you eat helps keep your blood glucose within normal limits, which helps you manage your diabetes.  A diet and nutrition specialist (registered dietitian) can help you make a meal plan and calculate how many carbohydrates you should have at each meal and snack. This information is not intended to replace advice given to you by your health care provider. Make sure you discuss any questions you have with your health care provider. Document Revised: 12/02/2016 Document Reviewed: 10/22/2015 Elsevier Patient Education  2020 Elsevier Inc.  

## 2019-06-22 NOTE — Progress Notes (Signed)
Ht 5' 4.5" (1.638 m)   Wt 183 lb (83 kg)   LMP  (LMP Unknown)   BMI 30.93 kg/m    Subjective:    Patient ID: Mary Parks, female    DOB: [birth year], 69 y.o.   MRN: [id number]  HPI: KRISTYANNA Parks is a 69 y.o. female  Chief Complaint  Patient presents with  . Diabetes  . Hypertension  . Hyperlipidemia    . This visit was completed via telephone due to the restrictions of the COVID-19 pandemic. All issues as above were discussed and addressed but no physical exam was performed. If it was felt that the patient should be evaluated in the office, they were directed there. The patient verbally consented to this visit. Patient was unable to complete an audio/visual visit due to Lack of equipment. Due to the catastrophic nature of the COVID-19 pandemic, this visit was done through audio contact only. . Location of the patient: home . Location of the provider: work . Those involved with this call:  . Provider: [name], DNP . CMA: Yvonna Alanis, CMA . Front Desk/Registration: Don Perking  . Time spent on call: 15 minutes on the phone discussing health concerns. 10 minutes total spent in review of patient's record and preparation of their chart.  . I verified patient identity using two factors (patient name and date of birth). Patient consents verbally to being seen via telemedicine visit today.    DIABETES Followed by Dr. Rosario Jacks,  A1C 7.7% last Friday.. Last visit1/22/2021 with endo, goes back to see him January.Currently taking Trulicity, Januvia, and Lantus (40units twice daily) and Humalog 6 in morning, 10 lunch, and 8 in evening. Reviewof chartover past year A1C has remained in 8-9 range. Pt endorses improvement in diet, eating less sweets. Hypoglycemic episodes: had one episode of BS< 70 this week, but was able to get this back up with orange juice Polydipsia/polyuria:no Visual disturbance:no Chest pain:no Paresthesias:no Glucose  Monitoring:yes Accucheck frequency: BID Fasting glucose: 100-116, lowest in 90's Post prandial: Evening: 140 - 200 Before meals: Taking Insulin?:yes Long acting insulin: 40 units Lantus BID Short acting insulin: Humalog 6 in morning, 10 lunch, and 8 in evening Blood Pressure Monitoring:a few times a week Retinal Examination:Not Up To Date -- goes next month Foot Exam:Up to Date Pneumovax:Up to Date Influenza:Up to Date Aspirin:yes  HYPERTENSION / HYPERLIPIDEMIA Current medications include Metoprolol, Lisinopril, and Amlodipine for HTN + Lipitor for HLD.  Sees nephrology at [facility], last saw in August and no changes made. Satisfied with current treatment?yes Duration of hypertension:chronic BP monitoring frequency: BP cuff is broken BP range: BP medication side effects:no Duration of hyperlipidemia:chronic Cholesterol medication side effects:no Cholesterol supplements: none Medication compliance:good compliance Aspirin:yes Recent stressors:no Recurrent headaches:no Visual changes:no Palpitations:no Dyspnea:no Chest pain:no Lower extremity edema:no Dizzy/lightheaded:no  HYPOTHYROIDISM Dr. Rosario Jacks lowered her Levothyroxine dose to 88 MCG due to TSH 0.333.  She reports Dr. Rosario Jacks put her on Vitamin D because her levels were low, taking weekly dosing at this time.   Thyroid control status:stable Satisfied with current treatment? yes Medication side effects: no Medication compliance: good compliance Etiology of hypothyroidism:  Recent dose adjustment:no Fatigue: no Cold intolerance: no Heat intolerance: no Weight gain: no Weight loss: no Constipation: no Diarrhea/loose stools: no Palpitations: no Lower extremity edema: no Anxiety/depressed mood: no  Relevant past medical, surgical, family and social history reviewed and updated as indicated. Interim medical history  since our last visit reviewed. Allergies and medications reviewed and updated.  Review of Systems  Constitutional: Negative for activity change, appetite change, diaphoresis, fatigue and fever.  Respiratory: Negative for cough, chest tightness and shortness of breath.   Cardiovascular: Negative for chest pain, palpitations and leg swelling.  Gastrointestinal: Negative for abdominal distention, abdominal pain, constipation, diarrhea, nausea and vomiting.  Endocrine: Negative for cold intolerance, heat intolerance, polydipsia, polyphagia and polyuria.  Neurological: Negative for dizziness, syncope, weakness, light-headedness, numbness and headaches.  Psychiatric/Behavioral: Negative.     Per HPI unless specifically indicated above     Objective:    Ht 5' 4.5" (1.638 m)   Wt 183 lb (83 kg)   LMP  (LMP Unknown)   BMI 30.93 kg/m   Wt Readings from Last 3 Encounters:  06/22/19 183 lb (83 kg)  06/05/19 182 lb (82.6 kg)  12/25/18 182 lb (82.6 kg)    Physical Exam   Unable to perform due to telephone visit only.  Results for orders placed or performed during the hospital encounter of 06/05/19  Urine culture   Specimen: Urine, Clean Catch  Result Value Ref Range   Specimen Description      URINE, CLEAN CATCH Performed at [facility], [address], [place], [place] [address]    Special Requests      NONE Performed at [facility], [address] [place], [place] [address]    Culture MULTIPLE SPECIES PRESENT, SUGGEST RECOLLECTION (A)    Report Status 06/06/2019 FINAL   Urinalysis, Complete w Microscopic  Result Value Ref Range   Color, Urine YELLOW YELLOW   APPearance HAZY (A) CLEAR   Specific Gravity, Urine 1.025 1.005 - 1.030   pH 5.5 5.0 - 8.0   Glucose, UA NEGATIVE NEGATIVE mg/dL   Hgb urine dipstick NEGATIVE NEGATIVE   Bilirubin Urine NEGATIVE NEGATIVE   Ketones, ur NEGATIVE NEGATIVE mg/dL   Protein, ur 100 (A) NEGATIVE mg/dL   Nitrite  NEGATIVE NEGATIVE   Leukocytes,Ua NEGATIVE NEGATIVE   Squamous Epithelial / LPF 21-50 0 - 5   WBC, UA 0-5 0 - 5 WBC/hpf   RBC / HPF NONE SEEN 0 - 5 RBC/hpf   Bacteria, UA FEW (A) NONE SEEN   Budding Yeast PRESENT       Assessment & Plan:   Problem List Items Addressed This Visit      Cardiovascular and Mediastinum   Hypertension associated with chronic kidney disease due to type 2 diabetes mellitus (HCC)    Chronic, stable with BP at goal recent visits.  Continue current medication regimen, Lisinopril for kidney protection with diabetes and collaboration with [facility] nephrology.  Recommend she monitor BP at home at least 3 mornings a week.  Obtain outpatient CMP.      Relevant Orders   Comprehensive metabolic panel   Microalbumin, Urine Waived     Endocrine   Diabetes mellitus with chronic kidney disease (HCC)    Chronic, ongoing.  A1C with endo last week 7.7%, slight upward trend, recommend focus on diet.  Continue current medication regimen and collaboration with endocrinology and nephrology.  CMP outpatient, continue Lisinopril for kidney protection.       Type 2 diabetes mellitus with diabetic neuropathy, with long-term current use of insulin (HCC) - Primary    Chronic, ongoing.  A1C with endo last week 7.7%, praised for her success with continued maintenance although upward trend slightly.  Continue current medication regimen and collaboration with endocrinology.  Obtain CMP outpatient      Hyperlipidemia associated with type 2  diabetes mellitus (HCC)    Chronic, ongoing.  Continue current medication regimen and adjust as needed.  Lipid and CMP outpatient.        Relevant Orders   Comprehensive metabolic panel   Lipid Panel w/o Chol/HDL Ratio     Genitourinary   Chronic kidney disease, stage 3    Chronic, ongoing.  Continue collaboration with [facility] nephrology.  Lisinopril for kidney protection.  Obtain CMP and urine micro outpatient.        Other   S/P total  thyroidectomy    Chronic, ongoing followed by Dr. Rosario Jacks with endo.  Continue current medication regimen and adjust as needed.  Current adjustments made by endo.          I discussed the assessment and treatment plan with the patient. The patient was provided an opportunity to ask questions and all were answered. The patient agreed with the plan and demonstrated an understanding of the instructions.   The patient was advised to call back or seek an in-person evaluation if the symptoms worsen or if the condition fails to improve as anticipated.   I provided 15 minutes of time during this encounter.  Follow up plan: Return in about 3 months (around 09/20/2019) for T2DM, HTN/HLD, Thyroid, CKD.

## 2019-06-22 NOTE — Assessment & Plan Note (Signed)
Chronic, ongoing.  A1C with endo last week 7.7%, praised for her success with continued maintenance although upward trend slightly.  Continue current medication regimen and collaboration with endocrinology.  Obtain CMP outpatient

## 2019-06-22 NOTE — Assessment & Plan Note (Signed)
Chronic, ongoing.  Continue collaboration with [facility] nephrology.  Lisinopril for kidney protection.  Obtain CMP and urine micro outpatient.

## 2019-06-22 NOTE — Assessment & Plan Note (Signed)
Chronic, ongoing.  A1C with endo last week 7.7%, slight upward trend, recommend focus on diet.  Continue current medication regimen and collaboration with endocrinology and nephrology.  CMP outpatient, continue Lisinopril for kidney protection.

## 2019-06-22 NOTE — Assessment & Plan Note (Signed)
Chronic, ongoing.  Continue current medication regimen and adjust as needed.  Lipid and CMP outpatient.

## 2019-06-22 NOTE — Assessment & Plan Note (Signed)
Chronic, ongoing followed by Dr. Rosario Jacks with endo.  Continue current medication regimen and adjust as needed.  Current adjustments made by endo.

## 2019-06-22 NOTE — Assessment & Plan Note (Signed)
Chronic, stable with BP at goal recent visits.  Continue current medication regimen, Lisinopril for kidney protection with diabetes and collaboration with [facility] nephrology.  Recommend she monitor BP at home at least 3 mornings a week.  Obtain outpatient CMP.

## 2019-07-10 ENCOUNTER — Other Ambulatory Visit: Payer: Self-pay | Admitting: Nurse Practitioner

## 2019-07-18 DIAGNOSIS — E119 Type 2 diabetes mellitus without complications: Secondary | ICD-10-CM | POA: Diagnosis not present

## 2019-07-18 LAB — HM DIABETES EYE EXAM

## 2019-07-26 ENCOUNTER — Telehealth: Payer: Self-pay

## 2019-07-26 NOTE — Telephone Encounter (Signed)
Called pt and advised her form is ready for pick up at the front.

## 2019-08-13 ENCOUNTER — Other Ambulatory Visit: Payer: Self-pay | Admitting: Nurse Practitioner

## 2019-08-13 NOTE — Telephone Encounter (Signed)
Requested Prescriptions  Pending Prescriptions Disp Refills  . LANTUS SOLOSTAR 100 UNIT/ML Solostar Pen [Pharmacy Med Name: Lantus SoloStar 100 UNIT/ML Subcutaneous Solution Pen-injector] 30 mL 0    Sig: INJECT 40 UNITS SUBCUTANEOUSLY TWICE DAILY     Endocrinology:  Diabetes - Insulins Failed - 08/13/2019 12:03 PM      Failed - HBA1C is between 0 and 7.9 and within 180 days    Hemoglobin A1C  Date Value Ref Range Status  01/05/2016 9.7  Final   HB A1C (BAYER DCA - WAIVED)  Date Value Ref Range Status  11/29/2018 8.4 (H) <7.0 % Final    Comment:                                          Diabetic Adult            <7.0                                       Healthy Adult        4.3 - 5.7                                                           (DCCT/NGSP) American Diabetes Association's Summary of Glycemic Recommendations for Adults with Diabetes: Hemoglobin A1c <7.0%. More stringent glycemic goals (A1c <6.0%) may further reduce complications at the cost of increased risk of hypoglycemia.          Passed - Valid encounter within last 6 months    Recent Outpatient Visits          1 month ago Type 2 diabetes mellitus with diabetic neuropathy, with long-term current use of insulin ([place])   [place], Lima T, NP   4 months ago 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, with long-term current use of insulin ([place])   [place], Jolene T, NP   8 months ago Diabetes mellitus due to underlying condition with stage 3 chronic kidney disease, with long-term current use of insulin ([place])   [place], Jolene T, NP   11 months ago Hypertension associated with chronic kidney disease due to type 2 diabetes mellitus ([place])   [place], Jolene T, NP   1 year ago Type 2 diabetes mellitus with diabetic neuropathy, with long-term current use of insulin ([place])   [place], Barbaraann Faster, NP      Future Appointments            In 1 month Cannady, Barbaraann Faster, NP [facility]           . lisinopril (ZESTRIL) 20 MG tablet [Pharmacy Med Name: Lisinopril 20 MG Oral Tablet] 90 tablet 0    Sig: Take 1 tablet by mouth once daily     Cardiovascular:  ACE Inhibitors Failed - 08/13/2019 12:03 PM      Failed - Cr in normal range and within 180 days    Creatinine, Ser  Date Value Ref Range Status  03/21/2019 1.12 (H) 0.57 - 1.00 mg/dL  Final         Failed - Last BP in normal range    BP Readings from Last 1 Encounters:  06/05/19 (!) 141/92         Passed - K in normal range and within 180 days    Potassium  Date Value Ref Range Status  03/21/2019 4.4 3.5 - 5.2 mmol/L Final         Passed - Patient is not pregnant      Passed - Valid encounter within last 6 months    Recent Outpatient Visits          1 month ago Type 2 diabetes mellitus with diabetic neuropathy, with long-term current use of insulin ([place])   [place], Esko T, NP   4 months ago 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, with long-term current use of insulin ([place])   [place], Jolene T, NP   8 months ago Diabetes mellitus due to underlying condition with stage 3 chronic kidney disease, with long-term current use of insulin ([place])   [place], Jolene T, NP   11 months ago Hypertension associated with chronic kidney disease due to type 2 diabetes mellitus ([place])   [place], Jolene T, NP   1 year ago Type 2 diabetes mellitus with diabetic neuropathy, with long-term current use of insulin ([place])   [place], Barbaraann Faster, NP      Future Appointments            In 1 month Cannady, Barbaraann Faster, NP [facility]

## 2019-09-04 DIAGNOSIS — M79671 Pain in right foot: Secondary | ICD-10-CM | POA: Diagnosis not present

## 2019-09-04 DIAGNOSIS — L600 Ingrowing nail: Secondary | ICD-10-CM | POA: Diagnosis not present

## 2019-09-04 DIAGNOSIS — E1142 Type 2 diabetes mellitus with diabetic polyneuropathy: Secondary | ICD-10-CM | POA: Diagnosis not present

## 2019-09-04 DIAGNOSIS — B351 Tinea unguium: Secondary | ICD-10-CM | POA: Diagnosis not present

## 2019-09-07 DIAGNOSIS — N289 Disorder of kidney and ureter, unspecified: Secondary | ICD-10-CM | POA: Diagnosis not present

## 2019-09-07 DIAGNOSIS — E039 Hypothyroidism, unspecified: Secondary | ICD-10-CM | POA: Diagnosis not present

## 2019-09-07 DIAGNOSIS — E785 Hyperlipidemia, unspecified: Secondary | ICD-10-CM | POA: Diagnosis not present

## 2019-09-07 DIAGNOSIS — Z7689 Persons encountering health services in other specified circumstances: Secondary | ICD-10-CM | POA: Diagnosis not present

## 2019-09-07 DIAGNOSIS — E559 Vitamin D deficiency, unspecified: Secondary | ICD-10-CM | POA: Diagnosis not present

## 2019-09-07 DIAGNOSIS — I10 Essential (primary) hypertension: Secondary | ICD-10-CM | POA: Diagnosis not present

## 2019-09-07 DIAGNOSIS — E1165 Type 2 diabetes mellitus with hyperglycemia: Secondary | ICD-10-CM | POA: Diagnosis not present

## 2019-09-10 ENCOUNTER — Other Ambulatory Visit: Payer: Self-pay | Admitting: Nurse Practitioner

## 2019-09-18 DIAGNOSIS — M79671 Pain in right foot: Secondary | ICD-10-CM | POA: Diagnosis not present

## 2019-09-18 DIAGNOSIS — B351 Tinea unguium: Secondary | ICD-10-CM | POA: Diagnosis not present

## 2019-09-18 DIAGNOSIS — L600 Ingrowing nail: Secondary | ICD-10-CM | POA: Diagnosis not present

## 2019-09-18 DIAGNOSIS — E1142 Type 2 diabetes mellitus with diabetic polyneuropathy: Secondary | ICD-10-CM | POA: Diagnosis not present

## 2019-09-25 ENCOUNTER — Other Ambulatory Visit: Payer: Self-pay | Admitting: Nurse Practitioner

## 2019-09-25 NOTE — Telephone Encounter (Signed)
Requested Prescriptions  Pending Prescriptions Disp Refills  . LANTUS SOLOSTAR 100 UNIT/ML Solostar Pen [Pharmacy Med Name: Lantus SoloStar 100 UNIT/ML Subcutaneous Solution Pen-injector] 30 mL 1    Sig: INJECT [place] DAILY     Endocrinology:  Diabetes - Insulins Failed - 09/25/2019 11:46 AM      Failed - HBA1C is between 0 and 7.9 and within 180 days    Hemoglobin A1C  Date Value Ref Range Status  01/05/2016 9.7  Final   HB A1C (BAYER DCA - WAIVED)  Date Value Ref Range Status  11/29/2018 8.4 (H) <7.0 % Final    Comment:                                          Diabetic Adult            <7.0                                       Healthy Adult        4.3 - 5.7                                                           (DCCT/NGSP) American Diabetes Association's Summary of Glycemic Recommendations for Adults with Diabetes: Hemoglobin A1c <7.0%. More stringent glycemic goals (A1c <6.0%) may further reduce complications at the cost of increased risk of hypoglycemia.          Passed - Valid encounter within last 6 months    Recent Outpatient Visits          3 months ago Type 2 diabetes mellitus with diabetic neuropathy, with long-term current use of insulin ([place])   [place], [place] T, NP   6 months ago 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, with long-term current use of insulin ([place])   [place], Jolene T, NP   10 months ago Diabetes mellitus due to underlying condition with stage 3 chronic kidney disease, with long-term current use of insulin ([place])   [place], [place] T, NP   1 year ago Hypertension associated with chronic kidney disease due to type 2 diabetes mellitus ([place])   [place], Jolene T, NP   1 year ago Type 2 diabetes mellitus with diabetic neuropathy, with long-term current use of insulin ([place])   [place], Barbaraann Faster, NP      Future Appointments            In 1 week Cannady, Barbaraann Faster, NP [facility]

## 2019-10-03 ENCOUNTER — Telehealth: Payer: Self-pay | Admitting: Nurse Practitioner

## 2019-10-03 ENCOUNTER — Ambulatory Visit (INDEPENDENT_AMBULATORY_CARE_PROVIDER_SITE_OTHER): Payer: Medicare Other | Admitting: Nurse Practitioner

## 2019-10-03 ENCOUNTER — Other Ambulatory Visit: Payer: Self-pay

## 2019-10-03 ENCOUNTER — Encounter: Payer: Self-pay | Admitting: Nurse Practitioner

## 2019-10-03 VITALS — BP 125/77 | HR 80 | Temp 97.9°F | Wt 186.0 lb

## 2019-10-03 DIAGNOSIS — J301 Allergic rhinitis due to pollen: Secondary | ICD-10-CM

## 2019-10-03 DIAGNOSIS — E6609 Other obesity due to excess calories: Secondary | ICD-10-CM

## 2019-10-03 DIAGNOSIS — N1831 Chronic kidney disease, stage 3a: Secondary | ICD-10-CM | POA: Diagnosis not present

## 2019-10-03 DIAGNOSIS — E1169 Type 2 diabetes mellitus with other specified complication: Secondary | ICD-10-CM | POA: Diagnosis not present

## 2019-10-03 DIAGNOSIS — Z794 Long term (current) use of insulin: Secondary | ICD-10-CM | POA: Diagnosis not present

## 2019-10-03 DIAGNOSIS — E0821 Diabetes mellitus due to underlying condition with diabetic nephropathy: Secondary | ICD-10-CM | POA: Diagnosis not present

## 2019-10-03 DIAGNOSIS — I129 Hypertensive chronic kidney disease with stage 1 through stage 4 chronic kidney disease, or unspecified chronic kidney disease: Secondary | ICD-10-CM

## 2019-10-03 DIAGNOSIS — Z6831 Body mass index (BMI) 31.0-31.9, adult: Secondary | ICD-10-CM

## 2019-10-03 DIAGNOSIS — E1122 Type 2 diabetes mellitus with diabetic chronic kidney disease: Secondary | ICD-10-CM | POA: Diagnosis not present

## 2019-10-03 DIAGNOSIS — E1140 Type 2 diabetes mellitus with diabetic neuropathy, unspecified: Secondary | ICD-10-CM | POA: Diagnosis not present

## 2019-10-03 DIAGNOSIS — E669 Obesity, unspecified: Secondary | ICD-10-CM | POA: Insufficient documentation

## 2019-10-03 DIAGNOSIS — E785 Hyperlipidemia, unspecified: Secondary | ICD-10-CM

## 2019-10-03 DIAGNOSIS — E890 Postprocedural hypothyroidism: Secondary | ICD-10-CM

## 2019-10-03 LAB — MICROALBUMIN, URINE WAIVED
Creatinine, Urine Waived: 200 mg/dL (ref 10–300)
Microalb, Ur Waived: 150 mg/L — ABNORMAL HIGH (ref 0–19)

## 2019-10-03 LAB — BAYER DCA HB A1C WAIVED: HB A1C (BAYER DCA - WAIVED): 8.6 % — ABNORMAL HIGH (ref <7.0)

## 2019-10-03 MED ORDER — MECLIZINE HCL 12.5 MG PO TABS: 12.5000 mg | ORAL_TABLET | Freq: Three times a day (TID) | ORAL | 0 refills | Status: DC | PRN

## 2019-10-03 MED ORDER — MONTELUKAST SODIUM 10 MG PO TABS
10.0000 mg | ORAL_TABLET | Freq: Every day | ORAL | 3 refills | Status: DC
Start: 2019-10-03 — End: 2021-02-06

## 2019-10-03 NOTE — Assessment & Plan Note (Signed)
Chronic, stable with BP at goal today.  Continue current medication regimen, Lisinopril for kidney protection with diabetes and collaboration with [facility] nephrology.  Recommend she monitor BP at home at least 3 mornings a week.  Obtain outpatient BMP and urine ALB today.  Focus on DASH diet at home.  Return in 3 months.

## 2019-10-03 NOTE — Assessment & Plan Note (Signed)
Chronic, ongoing followed by Dr. Rosario Jacks with endo.  Continue current medication regimen and adjust as needed.  Current adjustments made by endo.

## 2019-10-03 NOTE — Assessment & Plan Note (Signed)
Ongoing with poor control, has tried all OTC oral medications and uses Flonase daily.  Having vertigo with sinus issues.  Will refill Meclizine and recommend she use very minimally, only for acute episodes and not while driving.  Will start Singulair, since has failed all OTC oral medications and has ongoing poor control.  Educated her on this medication risks/benefits.  Script sent.  Has seen allergist in past, will refer back if ongoing issues.  Return in 3 months or sooner if worsening vertigo or sinus issues.

## 2019-10-03 NOTE — Assessment & Plan Note (Signed)
Chronic, ongoing.  Continue collaboration with [facility] nephrology.  Lisinopril for kidney protection.  Obtain BMP today.

## 2019-10-03 NOTE — Assessment & Plan Note (Addendum)
Chronic, ongoing.  A1C withA1C 8.6%, slight upward trend, recommend focus on diet.  Urine ALB 150 and A:C 30-300.  Continue current medication regimen and collaboration with endocrinology and nephrology.  BMP today, continue Lisinopril for kidney protection. Will reach out to [facility] Pharm[facility] and discuss increase in Trulicity to 3 MG weekly, may benefit A1C and can further discuss with endo.  Return in 3 months.  May benefit from trial of [facility].

## 2019-10-03 NOTE — Assessment & Plan Note (Signed)
Recommended eating smaller high protein, low fat meals more frequently and exercising 30 mins a day 5 times a week with a goal of 10-15lb weight loss in the next 3 months. Patient voiced their understanding and motivation to adhere to these recommendations.  

## 2019-10-03 NOTE — Chronic Care Management (AMB) (Signed)
  Care Management   Note  10/03/2019 Name: Mary Parks MRN: [id number] DOB: November 28, 1950  Mary Parks is a 69 y.o. year old female who is a primary care patient of Venita Lick, NP and is actively engaged with the care management team. I reached out to [name] by phone today to assist with scheduling a follow up visit with the Pharmacist  Follow up plan: Unsuccessful telephone outreach attempt made. A HIPPA compliant phone message was left for the patient providing contact information and requesting a return call.  The care management team will reach out to the patient again over the next 7 days.  If patient returns call to provider office, please advise to call [facility]  at [place], [place], [place], [place] [address] Direct Dial: [phone] [email or web address] Website: [email or web address]

## 2019-10-03 NOTE — Assessment & Plan Note (Signed)
Chronic, ongoing.  A1C with recent endo A1C 9% and today 8.6%.  Continue current medication regimen and collaboration with endocrinology.  BMP today, continue Lisinopril for kidney protection.  Will reach out to [facility] PharmD and discuss increase in Trulicity to 3 MG weekly.  Recommend patient continue to monitor BS at home.  Return in 3 months.

## 2019-10-03 NOTE — Assessment & Plan Note (Signed)
Chronic, ongoing. Continue current medication regimen and adjust as needed.  Lipid today.   

## 2019-10-03 NOTE — Progress Notes (Signed)
BP 125/77   Pulse 80   Temp 97.9 F (36.6 C) (Oral)   Wt 186 lb (84.4 kg)   LMP  (LMP Unknown)   SpO2 98%   BMI 31.43 kg/m    Subjective:    Patient ID: Mary Parks, female    DOB: Jan 14, 1951, 69 y.o.   MRN: [id number]  HPI: Mary Parks is a 69 y.o. female  Chief Complaint  Patient presents with  . Diabetes  . Hyperlipidemia  . Hypertension  . Hypothyroidism  . Dizziness    pt states she has been having vertigo the past few days, requesting new RX for meclizine   DIABETES Followed by Dr. Rosario Jacks, saw him 3 weeks ago.  A1C was elevated at 9%. Goes back to see him in July.  Currently taking Trulicity, Januvia, and Lantus (40 units twice daily) and Humalog 10 in morning, 8 lunch, and 6 in evening. Review of chart over past year A1C has remained in 8-9 range. Reports her sugars went up after Covid vaccine. Hypoglycemic episodes: none Polydipsia/polyuria: no Visual disturbance: no Chest pain: no Paresthesias: no Glucose Monitoring: yes             Accucheck frequency: BID             Fasting glucose: 96 to 112             Post prandial:             Evening: 150-200             Before meals: Taking Insulin?: yes             Long acting insulin: 40 units Lantus BID             Short acting insulin: Humalog 10 in morning, 8 lunch, and 6 in evening Blood Pressure Monitoring: a few times a week Retinal Examination: Not Up To Date Foot Exam: Up to Date Pneumovax: Up to Date Influenza: Up to Date Aspirin: yes  HYPOTHYROIDISM Followed by endocrinology, taking 88 MCG Levothyroxine. Thyroid control status:stable Satisfied with current treatment? yes Medication side effects: no Medication compliance: good compliance Etiology of hypothyroidism:  Recent dose adjustment:no Fatigue: no Cold intolerance: no Heat intolerance: no Weight gain: no Weight loss: no Constipation: no Diarrhea/loose stools: no Palpitations: no Lower extremity edema: no Anxiety/depressed  mood: no   CHRONIC KIDNEY DISEASE Goes to kidney provider on Friday.  Continues on Lisinopril. October 2020 -- CRT 1.12 and GFR 58. CKD status: stable Medications renally dose: yes Previous renal evaluation: yes Pneumovax:  Up to Date Influenza Vaccine:  Up to Date  HYPERTENSION / HYPERLIPIDEMIA Current medications include Metoprolol, Lisinopril, and Amlodipine for HTN + Lipitor for HLD.  Sees nephrology at [facility], last saw in August and no changes made. Satisfied with current treatment? yes Duration of hypertension: chronic BP monitoring frequency: a few times a week BP range: 120-130/80's BP medication side effects: no Duration of hyperlipidemia: chronic Cholesterol medication side effects: no Cholesterol supplements: none Medication compliance: good compliance Aspirin: yes Recent stressors: no Recurrent headaches: no Visual changes: no Palpitations: no Dyspnea: no Chest pain: no Lower extremity edema: no Dizzy/lightheaded: no   SINUS ISSUES Started a couple days ago.  Uses Flonase at home.  Does not take any OTC oral medications, has tried them all in past without success.  Is having vertigo with sinus issues, reports this presents with sinuses and would like refill on Meclizine. Fever: no Cough: yes -- at  night when drainage present Shortness of breath: no Wheezing: no Chest pain: yes, with cough Chest tightness: no Chest congestion: no Nasal congestion: yes Runny nose: yes Post nasal drip: yes Sneezing: yes Sore throat: no Swollen glands: no Sinus pressure: yes Headache: once and awhile Face pain: no Toothache: no Ear pain: right ear Ear pressure: right ear Eyes red/itching:yes Eye drainage/crusting: no  Vomiting: no Rash: no Fatigue: no Sick contacts: no Strep contacts: no  Context: stable Recurrent sinusitis: no Relief with OTC cold/cough medications: none  Treatments attempted: Flonase -- all OTC oral medications (allegra, Zyrtec,  Claritin)  Relevant past medical, surgical, family and social history reviewed and updated as indicated. Interim medical history since our last visit reviewed. Allergies and medications reviewed and updated.  Review of Systems  Constitutional: Negative for activity change, appetite change, diaphoresis, fatigue and fever.  Respiratory: Negative for cough, chest tightness and shortness of breath.   Cardiovascular: Negative for chest pain, palpitations and leg swelling.  Gastrointestinal: Negative.   Endocrine: Negative for cold intolerance, heat intolerance, polydipsia, polyphagia and polyuria.  Neurological: Negative.   Psychiatric/Behavioral: Negative.     Per HPI unless specifically indicated above     Objective:    BP 125/77   Pulse 80   Temp 97.9 F (36.6 C) (Oral)   Wt 186 lb (84.4 kg)   LMP  (LMP Unknown)   SpO2 98%   BMI 31.43 kg/m   Wt Readings from Last 3 Encounters:  10/03/19 186 lb (84.4 kg)  06/22/19 183 lb (83 kg)  06/05/19 182 lb (82.6 kg)    Physical Exam Vitals and nursing note reviewed.  Constitutional:      General: She is awake. She is not in acute distress.    Appearance: She is well-developed and well-groomed. She is obese. She is not ill-appearing.  HENT:     Head: Normocephalic.     Right Ear: Hearing, tympanic membrane, ear canal and external ear normal.     Left Ear: Hearing, tympanic membrane, ear canal and external ear normal.     Nose: Nose normal.     Right Sinus: No maxillary sinus tenderness or frontal sinus tenderness.     Left Sinus: No maxillary sinus tenderness or frontal sinus tenderness.     Mouth/Throat:     Mouth: Mucous membranes are moist.     Pharynx: Oropharynx is clear. Uvula midline. No posterior oropharyngeal erythema.  Eyes:     General: Lids are normal.        Right eye: No discharge.        Left eye: No discharge.     Conjunctiva/sclera: Conjunctivae normal.     Pupils: Pupils are equal, round, and reactive to  light.  Neck:     Vascular: No carotid bruit.  Cardiovascular:     Rate and Rhythm: Normal rate and regular rhythm.     Heart sounds: Normal heart sounds.  Pulmonary:     Effort: Pulmonary effort is normal. No accessory muscle usage or respiratory distress.     Breath sounds: Normal breath sounds.  Abdominal:     General: Bowel sounds are normal.     Palpations: Abdomen is soft.  Musculoskeletal:     Cervical back: Normal range of motion and neck supple.     Right lower leg: No edema.     Left lower leg: No edema.  Lymphadenopathy:     Cervical: No cervical adenopathy.  Skin:    General: Skin is warm and  dry.  Neurological:     Mental Status: She is alert and oriented to person, place, and time.     Cranial Nerves: Cranial nerves are intact.     Motor: Motor function is intact.     Coordination: Coordination is intact.     Gait: Gait is intact.     Deep Tendon Reflexes:     Reflex Scores:      Brachioradialis reflexes are 2+ on the right side and 2+ on the left side.      Patellar reflexes are 2+ on the right side and 2+ on the left side. Psychiatric:        Attention and Perception: Attention normal.        Mood and Affect: Mood normal.        Speech: Speech normal.        Behavior: Behavior normal. Behavior is cooperative.        Thought Content: Thought content normal.    Diabetic Foot Exam - Simple   No data filed      Results for orders placed or performed in visit on 07/19/19  HM DIABETES EYE EXAM  Result Value Ref Range   HM Diabetic Eye Exam No Retinopathy No Retinopathy      Assessment & Plan:   Problem List Items Addressed This Visit      Cardiovascular and Mediastinum   Hypertension associated with chronic kidney disease due to type 2 diabetes mellitus ([place])    Chronic, stable with BP at goal today.  Continue current medication regimen, Lisinopril for kidney protection with diabetes and collaboration with [facility] nephrology.  Recommend she monitor BP at  home at least 3 mornings a week.  Obtain outpatient BMP and urine ALB today.  Focus on DASH diet at home.  Return in 3 months.      Relevant Orders   Basic metabolic panel   Microalbumin, Urine Waived   Bayer DCA Hb A1c Waived     Respiratory   Allergic rhinitis    Ongoing with poor control, has tried all OTC oral medications and uses Flonase daily.  Having vertigo with sinus issues.  Will refill Meclizine and recommend she use very minimally, only for acute episodes and not while driving.  Will start Singulair, since has failed all OTC oral medications and has ongoing poor control.  Educated her on this medication risks/benefits.  Script sent.  Has seen allergist in past, will refer back if ongoing issues.  Return in 3 months or sooner if worsening vertigo or sinus issues.        Endocrine   Diabetes mellitus with chronic kidney disease ([place]) - Primary    Chronic, ongoing.  A1C withA1C 8.6%, slight upward trend, recommend focus on diet.  Urine ALB 150 and A:C 30-300.  Continue current medication regimen and collaboration with endocrinology and nephrology.  BMP today, continue Lisinopril for kidney protection. Will reach out to [facility] PharmD and discuss increase in Trulicity to 3 MG weekly, may benefit A1C and can further discuss with endo.  Return in 3 months.  May benefit from trial of [facility].      Relevant Orders   Basic metabolic panel   Microalbumin, Urine Waived   Bayer DCA Hb A1c Waived   Type 2 diabetes mellitus with diabetic neuropathy, with long-term current use of insulin ([facility])    Chronic, ongoing.  A1C with recent endo A1C 9% and today 8.6%.  Continue current medication regimen and collaboration with endocrinology.  BMP today, continue Lisinopril for kidney protection.  Will [facility] to [facility] Pharm[facility] and discuss increase in Trulicity to 3 MG weekly.  Recommend patient continue to monitor BS at home.  Return in 3 months.      Hyperlipidemia associated with type 2 diabetes  mellitus (HCC)    Chronic, ongoing.  Continue current medication regimen and adjust as needed.  Lipid today.        Relevant Orders   Lipid Panel w/o Chol/HDL Ratio   Bayer DCA Hb A1c Waived     Genitourinary   Chronic kidney disease, stage 3    Chronic, ongoing.  Continue collaboration with [facility] nephrology.  Lisinopril for kidney protection.  Obtain BMP today.        Other   S/P total thyroidectomy    Chronic, ongoing followed by Dr. Rosario Jacks with endo.  Continue current medication regimen and adjust as needed.  Current adjustments made by endo.       Obesity    Recommended eating smaller high protein, low fat meals more frequently and exercising 30 mins a day 5 times a week with a goal of 10-15lb weight loss in the next 3 months. Patient voiced their understanding and motivation to adhere to these recommendations.           Follow up plan: Return in about 3 months (around 01/03/2020) for T2DM, HTN/HLD, Thyroid, Allergic Rhinitis.

## 2019-10-03 NOTE — Patient Instructions (Signed)
Montelukast chewable tablets What is this medicine? MONTELUKAST (mon te LOO kast) is used to prevent and treat the symptoms of asthma. It is also used to treat allergies. Do not use for an acute asthma attack. This medicine may be used for other purposes; ask your health care provider or pharmacist if you have questions. COMMON BRAND NAME(S): Singulair What should I tell my health care provider before I take this medicine? They need to know if you have any of these conditions:  liver disease  phenylketonuria  an unusual or allergic reaction to montelukast, other medicines, foods, dyes, or preservatives  pregnant or trying to get pregnant  breast-feeding How should I use this medicine? Take this medicine by mouth with a glass of water. Chew it completely before swallowing. Follow the directions on the prescription label. If you have asthma, take this medicine once a day in the evening. If you have allergies, take this medicine once a day, at about the same time each day. You may take this medicine with or without food. Take your medicine at regular intervals. Do not take it more often than directed. Do not stop taking except on your doctor's advice. Talk to your pediatrician regarding the use of this medicine in children. While this drug may be prescribed for children as young as 67 years of age, precautions do apply. Overdosage: If you think you have taken too much of this medicine contact a poison control center or emergency room at once. NOTE: This medicine is only for you. Do not share this medicine with others. What if I miss a dose? If you miss a dose, skip it. Take your next dose at the normal time. Do not take extra or 2 doses at the same time to make up for the missed dose. What may interact with this medicine?  anti-infectives like rifampin and rifabutin  medicines for seizures like phenytoin, phenobarbital, and carbamazepine This list may not describe all possible interactions.  Give your health care provider a list of all the medicines, herbs, non-prescription drugs, or dietary supplements you use. Also tell them if you smoke, drink alcohol, or use illegal drugs. Some items may interact with your medicine. What should I watch for while using this medicine? Visit your doctor or health care professional for regular checks on your progress. Tell your doctor or health care professional if your allergy or asthma symptoms do not improve. Take your medicine even when you do not have symptoms. Do not stop taking any of your medicine(s) unless your doctor tells you to. If you have asthma, talk to your doctor about what to do in an acute asthma attack. Always have your inhaled rescue medicine for asthma attacks with you. Patients and their families should watch for new or worsening thoughts of suicide or depression. Also watch for sudden changes in feelings such as feeling anxious, agitated, panicky, irritable, hostile, aggressive, impulsive, severely restless, overly excited and hyperactive, or not being able to sleep. Any worsening of mood or thoughts of suicide or dying should be reported to your health care professional right away. What side effects may I notice from receiving this medicine? Side effects that you should report to your doctor or health care professional as soon as possible:  allergic reactions like skin rash or hives, or swelling of the face, lips, or tongue  breathing problems  changes in emotions or moods  confusion  depressed mood  fever or infection  hallucinations  joint pain  painful lumps under the  skin  pain, tingling, numbness in the hands or feet  redness, blistering, peeling, or loosening of the skin, including inside the mouth  restlessness  seizures  sleep walking  signs and symptoms of infection like fever; chills; cough; sore throat; flu-like illness  signs and symptoms of liver injury like dark yellow or brown urine; general  ill feeling or flu-like symptoms; light-colored stools; loss of appetite; nausea; right upper belly pain; unusually weak or tired; yellowing of the eyes or skin  sinus pain or swelling  stuttering  suicidal thoughts or other mood changes  tremors  trouble sleeping  uncontrolled muscle movements  unusual bleeding or bruising  vivid or bad dreams Side effects that usually do not require medical attention (report to your doctor or health care professional if they continue or are bothersome):  dizziness  drowsiness  headache  runny nose  stomach upset  tiredness This list may not describe all possible side effects. Call your doctor for medical advice about side effects. You may report side effects to FDA at 1-800-FDA-1088. Where should I keep my medicine? Keep out of the reach of children. Store at a room temperature between 15 and 30 degrees C (59 and 86 degrees F). Protect from light and moisture. Keep this medicine in the original bottle. Throw away any unused medicine after the expiration date. NOTE: This sheet is a summary. It may not cover all possible information. If you have questions about this medicine, talk to your doctor, pharmacist, or health care provider.  2020 Elsevier/Gold Standard (2018-09-08 12:57:44)

## 2019-10-04 LAB — LIPID PANEL W/O CHOL/HDL RATIO
Cholesterol, Total: 145 mg/dL (ref 100–199)
HDL: 37 mg/dL — ABNORMAL LOW (ref >39)
LDL Chol Calc (NIH): 86 mg/dL (ref 0–99)
Triglycerides: 122 mg/dL (ref 0–149)
VLDL Cholesterol Cal: 22 mg/dL (ref 5–40)

## 2019-10-04 LAB — BASIC METABOLIC PANEL
BUN/Creatinine Ratio: 12 (ref 12–28)
BUN: 13 mg/dL (ref 8–27)
CO2: 23 mmol/L (ref 20–29)
Calcium: 9 mg/dL (ref 8.7–10.3)
Chloride: 108 mmol/L — ABNORMAL HIGH (ref 96–106)
Creatinine, Ser: 1.13 mg/dL — ABNORMAL HIGH (ref 0.57–1.00)
GFR calc Af Amer: 57 mL/min/{1.73_m2} — ABNORMAL LOW (ref >59)
GFR calc non Af Amer: 49 mL/min/{1.73_m2} — ABNORMAL LOW (ref >59)
Glucose: 164 mg/dL — ABNORMAL HIGH (ref 65–99)
Potassium: 4 mmol/L (ref 3.5–5.2)
Sodium: 145 mmol/L — ABNORMAL HIGH (ref 134–144)

## 2019-10-04 NOTE — Progress Notes (Signed)
Good morning, please let Mary Parks know her labs have returned.  She continues to show some mild kidney disease, but no significant decline.  We will continue to monitor this.  Salt level was a little elevated, I recommend she take more water in during daytime hours and we will recheck at next visit.  Cholesterol levels are remaining in good ranges, but may be able to get tighter control by increasing Atorvastatin, we can discuss this more next visit.  Have a great day!!

## 2019-10-05 DIAGNOSIS — N2889 Other specified disorders of kidney and ureter: Secondary | ICD-10-CM | POA: Diagnosis not present

## 2019-10-05 DIAGNOSIS — N1831 Chronic kidney disease, stage 3a: Secondary | ICD-10-CM | POA: Diagnosis not present

## 2019-10-05 DIAGNOSIS — I151 Hypertension secondary to other renal disorders: Secondary | ICD-10-CM | POA: Diagnosis not present

## 2019-10-08 NOTE — Chronic Care Management (AMB) (Signed)
  Care Management   Note  10/08/2019 Name: AZYA CELIK MRN: [id number] DOB: Jan 09, 1951  CLARINA MARCONE is a 69 y.o. year old female who is a primary care patient of Venita Lick, NP and is actively engaged with the care management team. I reached out to [name] by phone today to assist with scheduling a follow up visit with the Pharmacist  Follow up plan: Telephone appointment with care management team member scheduled for:10/24/2019  Noreene Larsson, [address], [place], [place] [address] Direct Dial: [phone] [email or web address]: [email or web address]

## 2019-10-24 ENCOUNTER — Ambulatory Visit (INDEPENDENT_AMBULATORY_CARE_PROVIDER_SITE_OTHER): Payer: Medicare Other | Admitting: Pharmacist

## 2019-10-24 DIAGNOSIS — E1140 Type 2 diabetes mellitus with diabetic neuropathy, unspecified: Secondary | ICD-10-CM

## 2019-10-24 DIAGNOSIS — E785 Hyperlipidemia, unspecified: Secondary | ICD-10-CM

## 2019-10-24 DIAGNOSIS — Z794 Long term (current) use of insulin: Secondary | ICD-10-CM

## 2019-10-24 DIAGNOSIS — E1169 Type 2 diabetes mellitus with other specified complication: Secondary | ICD-10-CM | POA: Diagnosis not present

## 2019-10-24 NOTE — Chronic Care Management (AMB) (Signed)
Chronic Care Management   Follow Up Note   10/24/2019 Name: Mary Parks MRN: [id number] DOB: 1950-11-15  Referred by: Venita Lick, NP Reason for referral : Chronic Care Management (Medication Management)   Mary Parks is a 69 y.o. year old female who is a primary care patient of Cannady, Barbaraann Faster, NP. The CCM team was consulted for assistance with chronic disease management and care coordination needs.    Contacted patient for medication management review.   Review of patient status, including review of consultants reports, relevant laboratory and other test results, and collaboration with appropriate care team members and the patient's provider was performed as part of comprehensive patient evaluation and provision of chronic care management services.    SDOH (Social Determinants of Health) assessments performed: No See Care Plan activities for detailed interventions related to [facility])     Outpatient Encounter Medications as of 10/24/2019  Medication Sig Note  . albuterol (PROVENTIL HFA;VENTOLIN HFA) 108 (90 Base) MCG/ACT inhaler Inhale 2 puffs into the lungs every 6 (six) hours as needed for wheezing or shortness of breath. 10/24/2018: 3 times per week  . amLODipine (NORVASC) 2.5 MG tablet Take 2.5 mg by mouth daily.   Marland Kitchen aspirin EC 81 MG tablet Take 81 mg by mouth daily.   Marland Kitchen atorvastatin (LIPITOR) 40 MG tablet Take 1 tablet (40 mg total) by mouth at bedtime.   . budesonide-formoterol (SYMBICORT) 160-4.5 MCG/ACT inhaler Inhale 2 puffs into the lungs 2 (two) times daily. 10/24/2018: Taking PRN  . calcium-vitamin D (OSCAL WITH D) 500-200 MG-UNIT tablet Take 1 tablet by mouth.   . fluticasone (FLONASE) 50 MCG/ACT nasal spray USE 2 SPRAY(S) IN EACH NOSTRIL ONCE DAILY   . insulin lispro (HUMALOG) 100 UNIT/ML KwikPen INJECT 20 UNITS SUBCUTANEOUSLY BEFORE BREAKFAST AND LUNCH, AND 30 UNITS BEFORE EVENING MEAL   . LANTUS SOLOSTAR 100 UNIT/ML Solostar Pen INJECT 40 UNITS SUBCUTANEOUSLY  TWICE DAILY   . levothyroxine (SYNTHROID) 88 MCG tablet Take 88 mcg by mouth every morning.   Marland Kitchen lisinopril (ZESTRIL) 20 MG tablet Take 1 tablet by mouth once daily   . meclizine (ANTIVERT) 12.5 MG tablet Take 1 tablet (12.5 mg total) by mouth 3 (three) times daily as needed for dizziness.   . metoprolol tartrate (LOPRESSOR) 25 MG tablet Take 25 mg by mouth 2 (two) times daily.   . montelukast (SINGULAIR) 10 MG tablet Take 1 tablet (10 mg total) by mouth at bedtime.   Marland Kitchen omeprazole (PRILOSEC) 20 MG capsule Take 1 capsule (20 mg total) by mouth daily.   . ONE TOUCH ULTRA TEST test strip 1 each by Other route 2 (two) times daily.    [name] DELICA LANCETS [id number] MISC 2 (two) times daily.    . sitaGLIPtin (JANUVIA) 100 MG tablet Take 100 mg by mouth daily.   . TRULICITY 1.5 0000000 SOPN INJECT 1.5MG  INTO THE SKIN ONCE A WEEK    No facility-administered encounter medications on file as of 10/24/2019.     Objective:   Goals Addressed            This Visit's Progress     Patient Stated   . PharmD "I want to get my sugars better controlled" (pt-stated)       Current Barriers:  . Diabetes: uncontrolled; complicated by HTN, HLD, CKD, OA, most recent A1c 8.6% o Reports she has f/u with endocrinology Dr. Rosario Jacks in July  . Current antihyperglycemic regimen: Trulicity 1.5 mg weekly, Lantus 45 units BID, Humalog  10 units with breakfast, 6 units with lunch, 8 units with supper, Januvia 100 mg daily . Current exercise: Continues to walk with her friend group every morning . Current blood glucose readings:  o Fastings 80-120s o Pre-supper: 189-200s o Before bed (~4 hours): 180-190s . Cardiovascular risk reduction: o Current hypertensive regimen: amlodipine 2.5 mg daily, lisinopril 20 mg daily, metoprolol tartrate 25 mg BID o Current hyperlipidemia regimen: atorvastatin 40 mg daily, last LDL not at goal <70 . GERD: omeprazole 20 mg daily  . Hypothyroidism: levothyroxine 88 mcg daily  . Asthma:  Symbicort 160/4.5 mcg BID, albuterol HFA PRN; montelukast 10 mg daily started at last office visit; patient reports significant benefit w/ breathing and allergies with this addition  Pharmacist Clinical Goal(s):  Marland Kitchen Over the next 90 days, patient with work with PharmD and primary care provider to address optimized management of diabetes  Interventions: . Comprehensive medication review performed, medication list updated in electronic medical record . Inter-disciplinary care team collaboration (see longitudinal plan of care) . Discussed goal A1c, goal fasting, and goal 2 hour post prandial glucose . Discussed recommendation to increase Trulicity to target goal A1c and allow for reduced insulin burden.  . Encouraged to discuss w/ endocrinologist. Providing these instructions/recommendations in the mail for her to take to endocrinology appointment . Discussed duplicative MOA of Januvia and Trulicity. Discussed recommendation to consider d/c or at least swap to SGLT2. Discussed benefit of SGLT2, such as Farxiga, on CKD progression. Encouraged to discuss w/ endocrinologist. Providing these instructions/recommendations in the mail for her to take to endocrinology appointment . Between now and endo f/u, given elevated pre-supper readings, recommended she increase lunch time Humalog to 8 units. She verbalized understanding . Discussed Medicare criteria for CGM monitoring (injecting 3+ doses of insulin, checking sugar 4+ times daily). Encouraged to increase to checking 4 times daily between now and endo f/u to allow to discuss CGM at that appointment. She verbalized understanding  Patient Self Care Activities:  . Patient will check blood glucose BID, document, and provide at future appointments . Patient will take medications as prescribed . Patient will contact provider with any episodes of hypoglycemia . Patient will report any questions or concerns to provider   Please see past updates related to this  goal by clicking on the "Past Updates" button in the selected goal          Plan:  - Scheduled f/u call in ~ 10 weeks  Catie Darnelle Maffucci, PharmD, [place] [phone]

## 2019-10-24 NOTE — Patient Instructions (Addendum)
Ms. Zweig,   Here is what we discussed today:   1) Since your pre-supper glucose readings are elevated, we recommend you increase your Humalog to 8 units with lunch.   2) When you see Dr. Rosario Jacks, ask about increasing Trulicity to 3 mg weekly. This will likely allow for reduced doses of Lantus and Humalog in the long run  3) Also ask Dr. Rosario Jacks about the Januvia and Trulicity together. In our experience, there is little extra benefit of continuing Januvia when the Trulicity is being used.   4) Something that would likely benefit you more than the Januvia would be a medication like Farxiga (or [place] or Invokana), that has been show to have kidney protective benefits.   5) Increase to checking your sugars four times daily. Bring those readings to Dr. Guerry Bruin appointment. Since you are checking that often, and since you are injecting at least 3 shots of insulin daily, you should qualify for a Continuous Glucose Monitor like the FreeStyle Libre through your [employer] benefit. Talk to Dr. Rosario Jacks about this.   Please call me after that appointment to update Jolene and I!  Visit Information  Goals Addressed            This Visit's Progress     Patient Stated   . PharmD "I want to get my sugars better controlled" (pt-stated)       Current Barriers:  . Diabetes: uncontrolled; complicated by HTN, HLD, CKD, OA, most recent A1c 8.6% o Reports she has f/u with endocrinology Dr. Rosario Jacks in July  . Current antihyperglycemic regimen: Trulicity 1.5 mg weekly, Lantus 45 units BID, Humalog 10 units with breakfast, 6 units with lunch, 8 units with supper, Januvia 100 mg daily . Current exercise: Continues to walk with her friend group every morning . Current blood glucose readings:  o Fastings 80-120s o Pre-supper: 189-200s o Before bed (~4 hours): 180-190s . Cardiovascular risk reduction: o Current hypertensive regimen: amlodipine 2.5 mg daily, lisinopril 20 mg daily, metoprolol  tartrate 25 mg BID o Current hyperlipidemia regimen: atorvastatin 40 mg daily, last LDL not at goal <70 . GERD: omeprazole 20 mg daily  . Hypothyroidism: levothyroxine 88 mcg daily  . Asthma: Symbicort 160/4.5 mcg BID, albuterol HFA PRN; montelukast 10 mg daily started at last office visit; patient reports significant benefit w/ breathing and allergies with this addition  Pharmacist Clinical Goal(s):  Marland Kitchen Over the next 90 days, patient with work with PharmD and primary care provider to address optimized management of diabetes  Interventions: . Comprehensive medication review performed, medication list updated in electronic medical record . Inter-disciplinary care team collaboration (see longitudinal plan of care) . Discussed goal A1c, goal fasting, and goal 2 hour post prandial glucose . Discussed recommendation to increase Trulicity to target goal A1c and allow for reduced insulin burden.  . Encouraged to discuss w/ endocrinologist. Providing these instructions/recommendations in the mail for her to take to endocrinology appointment . Discussed duplicative MOA of Januvia and Trulicity. Discussed recommendation to consider d/c or at least swap to SGLT2. Discussed benefit of SGLT2, such as Farxiga, on CKD progression. Encouraged to discuss w/ endocrinologist. Providing these instructions/recommendations in the mail for her to take to endocrinology appointment . Between now and endo f/u, given elevated pre-supper readings, recommended she increase lunch time Humalog to 8 units. She verbalized understanding . Discussed Medicare criteria for CGM monitoring (injecting 3+ doses of insulin, checking sugar 4+ times daily). Encouraged to increase to checking 4 times daily between  now and endo f/u to allow to discuss CGM at that appointment. She verbalized understanding  Patient Self Care Activities:  . Patient will check blood glucose BID, document, and provide at future appointments . Patient will take  medications as prescribed . Patient will contact provider with any episodes of hypoglycemia . Patient will report any questions or concerns to provider   Please see past updates related to this goal by clicking on the "Past Updates" button in the selected goal         Patient verbalizes understanding of instructions provided today.   Plan:  - Scheduled f/u call in ~ 10 weeks  Catie Darnelle Maffucci, PharmD, [place] [phone]  [place], PharmD, [place] [phone]

## 2019-10-30 ENCOUNTER — Other Ambulatory Visit: Payer: Self-pay | Admitting: Nurse Practitioner

## 2019-10-30 NOTE — Telephone Encounter (Signed)
Requested Prescriptions  Pending Prescriptions Disp Refills  . LANTUS SOLOSTAR 100 UNIT/ML Solostar Pen [Pharmacy Med Name: Lantus SoloStar 100 UNIT/ML Subcutaneous Solution Pen-injector] 30 mL 0    Sig: INJECT [place] DAILY     Endocrinology:  Diabetes - Insulins Failed - 10/30/2019  9:23 AM      Failed - HBA1C is between 0 and 7.9 and within 180 days    Hemoglobin A1C  Date Value Ref Range Status  01/05/2016 9.7  Final   HB A1C (BAYER DCA - WAIVED)  Date Value Ref Range Status  10/03/2019 8.6 (H) <7.0 % Final    Comment:                                          Diabetic Adult            <7.0                                       Healthy Adult        4.3 - 5.7                                                           (DCCT/NGSP) American Diabetes Association's Summary of Glycemic Recommendations for Adults with Diabetes: Hemoglobin A1c <7.0%. More stringent glycemic goals (A1c <6.0%) may further reduce complications at the cost of increased risk of hypoglycemia.          Passed - Valid encounter within last 6 months    Recent Outpatient Visits          3 weeks ago 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, with long-term current use of insulin ([place])   [place], Jolene T, NP   4 months ago Type 2 diabetes mellitus with diabetic neuropathy, with long-term current use of insulin ([place])   [place], Jolene T, NP   7 months ago 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, with long-term current use of insulin ([place])   [place], Jolene T, NP   11 months ago Diabetes mellitus due to underlying condition with stage 3 chronic kidney disease, with long-term current use of insulin ([place])   [place], [place] T, NP   1 year ago Hypertension associated with chronic kidney disease due to type 2 diabetes  mellitus ([place])   [place], Barbaraann Faster, NP      Future Appointments            In 2 months Cannady, Barbaraann Faster, NP [facility]           . TRULICITY 1.5 CW/2.3JS SOPN [Pharmacy Med Name: Trulicity 1.5 EG/3.1DV Subcutaneous Solution Pen-injector] 8 mL 0    Sig: INJECT 1.5MG  INTO THE SKIN ONCE A WEEK     Endocrinology:  Diabetes - GLP-1 Receptor Agonists Failed - 10/30/2019  9:23 AM      Failed - HBA1C is between 0 and 7.9 and within 180 days    Hemoglobin A1C  Date Value Ref Range Status  01/05/2016 9.7  Final   HB A1C (BAYER DCA - WAIVED)  Date Value Ref Range Status  10/03/2019 8.6 (H) <7.0 % Final    Comment:                                          Diabetic Adult            <7.0                                       Healthy Adult        4.3 - 5.7                                                           (DCCT/NGSP) American Diabetes Association's Summary of Glycemic Recommendations for Adults with Diabetes: Hemoglobin A1c <7.0%. More stringent glycemic goals (A1c <6.0%) may further reduce complications at the cost of increased risk of hypoglycemia.          Passed - Valid encounter within last 6 months    Recent Outpatient Visits          3 weeks ago 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, with long-term current use of insulin ([place])   [place], Jolene T, NP   4 months ago Type 2 diabetes mellitus with diabetic neuropathy, with long-term current use of insulin ([place])   [place], Jolene T, NP   7 months ago 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, with long-term current use of insulin ([place])   [place], Jolene T, NP   11 months ago Diabetes mellitus due to underlying condition with stage 3 chronic kidney disease, with long-term current use of insulin ([place])   [place], [place] T, NP   1 year ago Hypertension associated with chronic kidney disease due to type 2 diabetes mellitus ([place])   [place], Barbaraann Faster, NP      Future Appointments            In 2 months Cannady, Barbaraann Faster, NP [facility]

## 2019-11-15 ENCOUNTER — Other Ambulatory Visit: Payer: Self-pay | Admitting: Nurse Practitioner

## 2019-12-11 ENCOUNTER — Other Ambulatory Visit: Payer: Self-pay | Admitting: Nurse Practitioner

## 2019-12-14 DIAGNOSIS — E1165 Type 2 diabetes mellitus with hyperglycemia: Secondary | ICD-10-CM | POA: Diagnosis not present

## 2019-12-14 DIAGNOSIS — E039 Hypothyroidism, unspecified: Secondary | ICD-10-CM | POA: Diagnosis not present

## 2019-12-14 DIAGNOSIS — N289 Disorder of kidney and ureter, unspecified: Secondary | ICD-10-CM | POA: Diagnosis not present

## 2019-12-14 DIAGNOSIS — E785 Hyperlipidemia, unspecified: Secondary | ICD-10-CM | POA: Diagnosis not present

## 2019-12-14 DIAGNOSIS — K219 Gastro-esophageal reflux disease without esophagitis: Secondary | ICD-10-CM | POA: Diagnosis not present

## 2019-12-28 ENCOUNTER — Ambulatory Visit (INDEPENDENT_AMBULATORY_CARE_PROVIDER_SITE_OTHER): Payer: Medicare Other

## 2019-12-28 ENCOUNTER — Other Ambulatory Visit: Payer: Self-pay | Admitting: Nurse Practitioner

## 2019-12-28 VITALS — Ht 64.0 in | Wt 183.0 lb

## 2019-12-28 DIAGNOSIS — Z0000 Encounter for general adult medical examination without abnormal findings: Secondary | ICD-10-CM | POA: Diagnosis not present

## 2019-12-28 DIAGNOSIS — Z1231 Encounter for screening mammogram for malignant neoplasm of breast: Secondary | ICD-10-CM

## 2019-12-28 NOTE — Patient Instructions (Signed)
Mary Parks , Thank you for taking time to come for your Medicare Wellness Visit. I appreciate your ongoing commitment to your health goals. Please review the following plan we discussed and let me know if I can assist you in the future.   Screening recommendations/referrals: Colonoscopy: completed 11/12/2016 Mammogram: scheduled for Sept Bone Density: completed 01/20/2010 Recommended yearly ophthalmology/optometry visit for glaucoma screening and checkup Recommended yearly dental visit for hygiene and checkup  Vaccinations: Influenza vaccine: due Pneumococcal vaccine: completed 03/25/2015 Tdap vaccine: due Shingles vaccine: discussed   Covid-19: 07/27/2019, 07/06/2019  Advanced directives: Please bring a copy of your POA (Power of Attorney) and/or Living Will to your next appointment.   Conditions/risks identified: none  Next appointment: Follow up in one year for your annual wellness visit    Preventive Care 65 Years and Older, Female Preventive care refers to lifestyle choices and visits with your health care provider that can promote health and wellness. What does preventive care include?  A yearly physical exam. This is also called an annual well check.  Dental exams once or twice a year.  Routine eye exams. Ask your health care provider how often you should have your eyes checked.  Personal lifestyle choices, including:  Daily care of your teeth and gums.  Regular physical activity.  Eating a healthy diet.  Avoiding tobacco and drug use.  Limiting alcohol use.  Practicing safe sex.  Taking low-dose aspirin every day.  Taking vitamin and mineral supplements as recommended by your health care provider. What happens during an annual well check? The services and screenings done by your health care provider during your annual well check will depend on your age, overall health, lifestyle risk factors, and family history of disease. Counseling  Your health care provider  may ask you questions about your:  Alcohol use.  Tobacco use.  Drug use.  Emotional well-being.  Home and relationship well-being.  Sexual activity.  Eating habits.  History of falls.  Memory and ability to understand (cognition).  Work and work Statistician.  Reproductive health. Screening  You may have the following tests or measurements:  Height, weight, and BMI.  Blood pressure.  Lipid and cholesterol levels. These may be checked every 5 years, or more frequently if you are over 35 years old.  Skin check.  Lung cancer screening. You may have this screening every year starting at age 41 if you have a 30-pack-year history of smoking and currently smoke or have quit within the past 15 years.  Fecal occult blood test (FOBT) of the stool. You may have this test every year starting at age 37.  Flexible sigmoidoscopy or colonoscopy. You may have a sigmoidoscopy every 5 years or a colonoscopy every 10 years starting at age 23.  Hepatitis C blood test.  Hepatitis B blood test.  Sexually transmitted disease (STD) testing.  Diabetes screening. This is done by checking your blood sugar (glucose) after you have not eaten for a while (fasting). You may have this done every 1-3 years.  Bone density scan. This is done to screen for osteoporosis. You may have this done starting at age 53.  Mammogram. This may be done every 1-2 years. Talk to your health care provider about how often you should have regular mammograms. Talk with your health care provider about your test results, treatment options, and if necessary, the need for more tests. Vaccines  Your health care provider may recommend certain vaccines, such as:  Influenza vaccine. This is recommended every year.  Tetanus, diphtheria, and acellular pertussis (Tdap, Td) vaccine. You may need a Td booster every 10 years.  Zoster vaccine. You may need this after age 3.  Pneumococcal 13-valent conjugate (PCV13) vaccine.  One dose is recommended after age 82.  Pneumococcal polysaccharide (PPSV23) vaccine. One dose is recommended after age 60. Talk to your health care provider about which screenings and vaccines you need and how often you need them. This information is not intended to replace advice given to you by your health care provider. Make sure you discuss any questions you have with your health care provider. Document Released: 06/06/2015 Document Revised: 01/28/2016 Document Reviewed: 03/11/2015 Elsevier Interactive Patient Education  2017 [place] Prevention in the Home Falls can cause injuries. They can happen to people of all ages. There are many things you can do to make your home safe and to help prevent falls. What can I do on the outside of my home?  Regularly fix the edges of walkways and driveways and fix any cracks.  Remove anything that might make you trip as you walk through a door, such as a raised step or threshold.  Trim any bushes or trees on the path to your home.  Use bright outdoor lighting.  Clear any walking paths of anything that might make someone trip, such as rocks or tools.  Regularly check to see if handrails are loose or broken. Make sure that both sides of any steps have handrails.  Any raised decks and porches should have guardrails on the edges.  Have any leaves, snow, or ice cleared regularly.  Use sand or salt on walking paths during winter.  Clean up any spills in your garage right away. This includes oil or grease spills. What can I do in the bathroom?  Use night lights.  Install grab bars by the toilet and in the tub and shower. Do not use towel bars as grab bars.  Use non-skid mats or decals in the tub or shower.  If you need to sit down in the shower, use a plastic, non-slip stool.  Keep the floor dry. Clean up any water that spills on the floor as soon as it happens.  Remove soap buildup in the tub or shower regularly.  Attach bath  mats securely with double-sided non-slip rug tape.  Do not have throw rugs and other things on the floor that can make you trip. What can I do in the bedroom?  Use night lights.  Make sure that you have a light by your bed that is easy to reach.  Do not use any sheets or blankets that are too big for your bed. They should not hang down onto the floor.  Have a firm chair that has side arms. You can use this for support while you get dressed.  Do not have throw rugs and other things on the floor that can make you trip. What can I do in the kitchen?  Clean up any spills right away.  Avoid walking on wet floors.  Keep items that you use a lot in easy-to-reach places.  If you need to reach something above you, use a strong step stool that has a grab bar.  Keep electrical cords out of the way.  Do not use floor polish or wax that makes floors slippery. If you must use wax, use non-skid floor wax.  Do not have throw rugs and other things on the floor that can make you trip. What can I do  with my stairs?  Do not leave any items on the stairs.  Make sure that there are handrails on both sides of the stairs and use them. Fix handrails that are broken or loose. Make sure that handrails are as long as the stairways.  Check any carpeting to make sure that it is firmly attached to the stairs. Fix any carpet that is loose or worn.  Avoid having throw rugs at the top or bottom of the stairs. If you do have throw rugs, attach them to the floor with carpet tape.  Make sure that you have a light switch at the top of the stairs and the bottom of the stairs. If you do not have them, ask someone to add them for you. What else can I do to help prevent falls?  Wear shoes that:  Do not have high heels.  Have rubber bottoms.  Are comfortable and fit you well.  Are closed at the toe. Do not wear sandals.  If you use a stepladder:  Make sure that it is fully opened. Do not climb a closed  stepladder.  Make sure that both sides of the stepladder are locked into place.  Ask someone to hold it for you, if possible.  Clearly mark and make sure that you can see:  Any grab bars or handrails.  First and last steps.  Where the edge of each step is.  Use tools that help you move around (mobility aids) if they are needed. These include:  Canes.  Walkers.  Scooters.  Crutches.  Turn on the lights when you go into a dark area. Replace any light bulbs as soon as they burn out.  Set up your furniture so you have a clear path. Avoid moving your furniture around.  If any of your floors are uneven, fix them.  If there are any pets around you, be aware of where they are.  Review your medicines with your doctor. Some medicines can make you feel dizzy. This can increase your chance of falling. Ask your doctor what other things that you can do to help prevent falls. This information is not intended to replace advice given to you by your health care provider. Make sure you discuss any questions you have with your health care provider. Document Released: 03/06/2009 Document Revised: 10/16/2015 Document Reviewed: 06/14/2014 Elsevier Interactive Patient Education  2017 [employer].

## 2019-12-28 NOTE — Progress Notes (Addendum)
I connected with Rhoderick Moody today by telephone and verified that I am speaking with the correct person using two identifiers. Location patient: home Location provider: work Persons participating in the virtual visit: Johan, Antonacci LPN.   I discussed the limitations, risks, security and privacy concerns of performing an evaluation and management service by telephone and the availability of in person appointments. I also discussed with the patient that there may be a patient responsible charge related to this service. The patient expressed understanding and verbally consented to this telephonic visit.    Interactive audio and video telecommunications were attempted between this provider and patient, however failed, due to patient having technical difficulties OR patient did not have access to video capability.  We continued and completed visit with audio only.    Vital signs may be patient reported or missing.  Subjective:   Mary Parks is a 69 y.o. female who presents for Medicare Annual (Subsequent) preventive examination.  Review of Systems     Cardiac Risk Factors include: advanced age (>65men, >45 women);diabetes mellitus;dyslipidemia;hypertension;obesity (BMI >30kg/m2)     Objective:    Today's Vitals   12/28/19 0856  Weight: 183 lb (83 kg)  Height: 5\' 4"  (1.626 m)   Body mass index is 31.41 kg/m.  Advanced Directives 12/28/2019 12/25/2018 06/02/2018 01/16/2018 12/19/2017 08/02/2017 11/26/2016  Does Patient Have a Medical Advance Directive? Yes No No No No No No  Type of Paramedic of [place];Living will - - - - - -  Copy of [place] in Chart? No - copy requested - - - - - -  Would patient like information on creating a medical advance directive? - - - - Yes (MAU/Ambulatory/Procedural Areas - Information given) - Yes (MAU/Ambulatory/Procedural Areas - Information given)    Current Medications (verified) Outpatient  Encounter Medications as of 12/28/2019  Medication Sig  . albuterol (PROVENTIL HFA;VENTOLIN HFA) 108 (90 Base) MCG/ACT inhaler Inhale 2 puffs into the lungs every 6 (six) hours as needed for wheezing or shortness of breath.  Marland Kitchen amLODipine (NORVASC) 2.5 MG tablet Take 2.5 mg by mouth daily.  Marland Kitchen aspirin EC 81 MG tablet Take 81 mg by mouth daily.  Marland Kitchen atorvastatin (LIPITOR) 40 MG tablet Take 1 tablet (40 mg total) by mouth at bedtime.  . budesonide-formoterol (SYMBICORT) 160-4.5 MCG/ACT inhaler Inhale 2 puffs into the lungs 2 (two) times daily.  . calcium-vitamin D (OSCAL WITH D) 500-200 MG-UNIT tablet Take 1 tablet by mouth.  . fluticasone (FLONASE) 50 MCG/ACT nasal spray USE 2 SPRAY(S) IN EACH NOSTRIL ONCE DAILY  . insulin lispro (HUMALOG) 100 UNIT/ML KwikPen INJECT 20 UNITS SUBCUTANEOUSLY BEFORE BREAKFAST AND LUNCH, AND 30 UNITS BEFORE EVENING MEAL  . LANTUS SOLOSTAR 100 UNIT/ML Solostar Pen INJECT 40 UNITS SUBCUTANEOUSLY TWICE DAILY  . levothyroxine (SYNTHROID) 88 MCG tablet Take 88 mcg by mouth every morning.  Marland Kitchen lisinopril (ZESTRIL) 20 MG tablet Take 1 tablet by mouth once daily  . meclizine (ANTIVERT) 12.5 MG tablet Take 1 tablet (12.5 mg total) by mouth 3 (three) times daily as needed for dizziness.  . metoprolol tartrate (LOPRESSOR) 25 MG tablet Take 25 mg by mouth 2 (two) times daily.  . montelukast (SINGULAIR) 10 MG tablet Take 1 tablet (10 mg total) by mouth at bedtime.  . ONE TOUCH ULTRA TEST test strip 1 each by Other route 2 (two) times daily.   [name] DELICA LANCETS 39Q MISC 2 (two) times daily.   . TRULICITY 1.5 ZE/0.9QZ  SOPN INJECT 1.5MG  INTO THE SKIN ONCE A WEEK  . omeprazole (PRILOSEC) 20 MG capsule Take 1 capsule (20 mg total) by mouth daily. (Patient not taking: Reported on 12/28/2019)  . sitaGLIPtin (JANUVIA) 100 MG tablet Take 100 mg by mouth daily. (Patient not taking: Reported on 12/28/2019)   No facility-administered encounter medications on file as of 12/28/2019.    Allergies  (verified) Celebrex [celecoxib], Sulfa antibiotics, Sulfisoxazole, and Sulfur   History: Past Medical History:  Diagnosis Date  . Asthma   . Chest pain   . DM2 (diabetes mellitus, type 2) ([place]) 1987  . Dyspnea    Cath normal coronaries. normal EF. RA 11. PA 37/17 (26) LVEDP 36  . GERD (gastroesophageal reflux disease)   . HTN (hypertension)   . Hyperlipidemia   . Obesity   . Osteoarthritis    knees  . Renal disease   . Thyroid disease    having thyroid removed 04/14/15  . Vertigo    none recently   Past Surgical History:  Procedure Laterality Date  . CARDIAC CATHETERIZATION    . CATARACT EXTRACTION W/PHACO Right 03/31/2015   Procedure: CATARACT EXTRACTION PHACO AND INTRAOCULAR LENS PLACEMENT (IOC);  Surgeon: Ronnell Freshwater, MD;  Location: [place];  Service: Ophthalmology;  Laterality: Right;  DIABETIC - insulin  . COLONOSCOPY WITH PROPOFOL N/A 11/12/2016   Procedure: COLONOSCOPY WITH PROPOFOL;  Surgeon: [name], MD;  Location: [place];  Service: Endoscopy;  Laterality: N/A;  Diabetic - insulin  . EYE SURGERY    . HERNIA REPAIR     umbilical  . POLYPECTOMY  11/12/2016   Procedure: POLYPECTOMY INTESTINAL;  Surgeon: [name], MD;  Location: [place];  Service: Endoscopy;;  . THYROIDECTOMY N/A 04/14/2015   Procedure: THYROIDECTOMY;  Surgeon: Margaretha Sheffield, MD;  Location: [facility];  Service: ENT;  Laterality: N/A;   Family History  Problem Relation Age of Onset  . Diabetes Mother   . Diabetes Father   . Diabetes Sister   . Diabetes Brother   . Diabetes Sister   . Diabetes Sister   . Diabetes Maternal Grandmother   . Diabetes Maternal Grandfather   . Diabetes Paternal Grandmother   . Diabetes Paternal Grandfather   . Heart disease Neg Hx   . Coronary artery disease Neg Hx   . Breast cancer Neg Hx    Social History   Socioeconomic History  . Marital status: Widowed    Spouse name: Not on file  . Number of children:  Not on file  . Years of education: Not on file  . Highest education level: Some college, no degree  Occupational History  . 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 Occupation: retired  Tobacco Use  . Smoking status: Never Smoker  . Smokeless tobacco: Never Used  Vaping Use  . Vaping Use: Never used  Substance and Sexual Activity  . Alcohol use: No  . Drug use: No  . Sexual activity: Not Currently  Other Topics Concern  . Not on file  Social History Narrative  . Not on file   Social Determinants of Health   Financial Resource Strain: Low Risk   . Difficulty of Paying Living Expenses: Not hard at all  Food Insecurity: No Food Insecurity  . Worried About Charity fundraiser in the Last Year: Never true  . Ran Out of Food in the Last Year: Never true  Transportation Needs: No Transportation Needs  .  Lack of Transportation (Medical): No  . Lack of Transportation (Non-Medical): No  Physical Activity: Insufficiently Active  . Days of Exercise per Week: 2 days  . Minutes of Exercise per Session: 60 min  Stress: No Stress Concern Present  . Feeling of Stress : Not at all  Social Connections:   . Frequency of Communication with Friends and Family:   . Frequency of Social Gatherings with Friends and Family:   . Attends Religious Services:   . Active Member of Clubs or Organizations:   . Attends Archivist Meetings:   Marland Kitchen Marital Status:     Tobacco Counseling Counseling given: Not Answered   Clinical Intake:  Pre-visit preparation completed: Yes  Pain : No/denies pain     Nutritional Status: BMI > 30  Obese Nutritional Risks: None Diabetes: Yes  How often do you need to have someone help you when you read instructions, pamphlets, or other written materials from your doctor or pharmacy?: 1 - Never What is the last grade level you completed in school?: 12th grade  Diabetic? Yes Nutrition Risk Assessment:  Has the patient  had any N/V/D within the last 2 months?  No  Does the patient have any non-healing wounds?  No  Has the patient had any unintentional weight loss or weight gain?  No   Diabetes:  Is the patient diabetic?  Yes  If diabetic, was a CBG obtained today?  No  Did the patient bring in their glucometer from home?  No  How often do you monitor your CBG's? Twice daily.   Financial Strains and Diabetes Management:  Are you having any financial strains with the device, your supplies or your medication? No .  Does the patient want to be seen by Chronic Care Management for management of their diabetes?  No  Would the patient like to be referred to a Nutritionist or for Diabetic Management?  No   Diabetic Exams:  Diabetic Eye Exam: Completed 07/18/2019 Diabetic Foot Exam: Completed 03/21/2019  Interpreter Needed?: No  Information entered by :: NAllen LPN   Activities of Daily Living In your present state of health, do you have any difficulty performing the following activities: 12/28/2019  Hearing? N  Vision? N  Difficulty concentrating or making decisions? N  Walking or climbing stairs? N  Dressing or bathing? N  Doing errands, shopping? N  Preparing Food and eating ? N  Using the Toilet? N  In the past six months, have you accidently leaked urine? N  Do you have problems with loss of bowel control? N  Managing your Medications? N  Managing your Finances? N  Housekeeping or managing your Housekeeping? N  Some recent data might be hidden    Patient Care Team: Venita Lick, NP as PCP - General (Nurse Practitioner) [place], Thomasena Edis, MD as Referring Physician (Nephrology) De Hollingshead, [facility] as Pharmacist (Pharmacist)  Indicate any recent Medical Services you may have received from other than [facility] providers in the past year (date may be approximate).     Assessment:   This is a routine wellness examination for [place].  Hearing/Vision screen  Hearing Screening    125Hz  250Hz  500Hz  1000Hz  2000Hz  3000Hz  4000Hz  6000Hz  8000Hz   Right ear:           Left ear:           Vision Screening Comments: Regular eye exams, Dr. Wyatt Portela [facility])  Dietary issues and exercise activities discussed: Current Exercise Habits: Home exercise routine, Type  of exercise: walking, Time (Minutes): 60, Frequency (Times/Week): 2, Weekly Exercise (Minutes/Week): 120  Goals    .  DIET - INCREASE WATER INTAKE      Recommend drinking at least 6-8 glasses of water a day     .  Increase water intake      Recommend drinking at least 4-5 glasses of water a day     .  Patient Stated      12/28/2019, try to do better with diabetes    .  PharmD "I want to get my sugars better controlled" (pt-stated)      Current Barriers:  . Diabetes: uncontrolled; complicated by HTN, HLD, CKD, OA, most recent A1c 8.6% o Reports she has f/u with endocrinology Dr. Rosario Jacks in July  . Current antihyperglycemic regimen: Trulicity 1.5 mg weekly, Lantus 45 units BID, Humalog 10 units with breakfast, 6 units with lunch, 8 units with supper, Januvia 100 mg daily . Current exercise: Continues to walk with her friend group every morning . Current blood glucose readings:  o Fastings 80-120s o Pre-supper: 189-200s o Before bed (~4 hours): 180-190s . Cardiovascular risk reduction: o Current hypertensive regimen: amlodipine 2.5 mg daily, lisinopril 20 mg daily, metoprolol tartrate 25 mg BID o Current hyperlipidemia regimen: atorvastatin 40 mg daily, last LDL not at goal <70 . GERD: omeprazole 20 mg daily  . Hypothyroidism: levothyroxine 88 mcg daily  . Asthma: Symbicort 160/4.5 mcg BID, albuterol HFA PRN; montelukast 10 mg daily started at last office visit; patient reports significant benefit w/ breathing and allergies with this addition  Pharmacist Clinical Goal(s):  Marland Kitchen Over the next 90 days, patient with work with PharmD and primary care provider to address optimized management of  diabetes  Interventions: . Comprehensive medication review performed, medication list updated in electronic medical record . Inter-disciplinary care team collaboration (see longitudinal plan of care) . Discussed goal A1c, goal fasting, and goal 2 hour post prandial glucose . Discussed recommendation to increase Trulicity to target goal A1c and allow for reduced insulin burden.  . Encouraged to discuss w/ endocrinologist. Providing these instructions/recommendations in the mail for her to take to endocrinology appointment . Discussed duplicative MOA of Januvia and Trulicity. Discussed recommendation to consider d/c or at least swap to SGLT2. Discussed benefit of SGLT2, such as Farxiga, on CKD progression. Encouraged to discuss w/ endocrinologist. Providing these instructions/recommendations in the mail for her to take to endocrinology appointment . Between now and endo f/u, given elevated pre-supper readings, recommended she increase lunch time Humalog to 8 units. She verbalized understanding . Discussed Medicare criteria for CGM monitoring (injecting 3+ doses of insulin, checking sugar 4+ times daily). Encouraged to increase to checking 4 times daily between now and endo f/u to allow to discuss CGM at that appointment. She verbalized understanding  Patient Self Care Activities:  . Patient will check blood glucose BID, document, and provide at future appointments . Patient will take medications as prescribed . Patient will contact provider with any episodes of hypoglycemia . Patient will report any questions or concerns to provider   Please see past updates related to this goal by clicking on the "Past Updates" button in the selected goal        Depression Screen PHQ 2/9 Scores 12/28/2019 12/25/2018 12/19/2017 11/26/2016 10/03/2015  PHQ - 2 Score 0 0 0 0 0  PHQ- 9 Score 0 - - - -    Fall Risk Fall Risk  12/28/2019 12/25/2018 11/29/2018 12/19/2017 11/26/2016  Falls in the past  year? 0 0 0 No No  Risk for  fall due to : Medication side effect - - - -  Follow up Falls evaluation completed;Education provided;Falls prevention discussed - Falls evaluation completed - -    Any stairs in or around the home? No  If so, are there any without handrails? n/a Home free of loose throw rugs in walkways, pet beds, electrical cords, etc? Yes  Adequate lighting in your home to reduce risk of falls? Yes   ASSISTIVE DEVICES UTILIZED TO PREVENT FALLS:  Life alert? No  Use of a cane, walker or w/c? No  Grab bars in the bathroom? Yes  Shower chair or bench in shower? Yes  Elevated toilet seat or a handicapped toilet? Yes   TIMED UP AND GO:  Was the test performed? No .     Cognitive Function:     6CIT Screen 12/28/2019 12/19/2017 11/26/2016  What Year? 0 points 0 points 0 points  What month? 0 points 0 points 0 points  What time? 0 points 0 points 0 points  Count back from 20 0 points 0 points 0 points  Months in reverse 0 points 0 points 0 points  Repeat phrase 0 points 2 points 0 points  Total Score 0 2 0    Immunizations Immunization History  Administered Date(s) Administered  . Influenza, High Dose Seasonal PF 02/25/2018, 02/15/2019  . Influenza,inj,Quad PF,6+ Mos 03/25/2015  . Influenza-Unspecified 02/21/2014, 03/31/2016, 04/01/2017  . PFIZER SARS-COV-2 Vaccination 07/06/2019, 07/27/2019  . Pneumococcal Conjugate-13 09/26/2013  . Pneumococcal Polysaccharide-23 03/13/2008, 03/25/2015  . Td 03/13/2008    TDAP status: Due, Education has been provided regarding the importance of this vaccine. Advised may receive this vaccine at local pharmacy or Health Dept. Aware to provide a copy of the vaccination record if obtained from local pharmacy or Health Dept. Verbalized acceptance and understanding. Flu Vaccine status: Up to date Pneumococcal vaccine status: Up to date Covid-19 vaccine status: Completed vaccines  Qualifies for Shingles Vaccine? Yes   Zostavax completed No   Shingrix Completed?:  No.    Education has been provided regarding the importance of this vaccine. Patient has been advised to call insurance company to determine out of pocket expense if they have not yet received this vaccine. Advised may also receive vaccine at local pharmacy or Health Dept. Verbalized acceptance and understanding.  Screening Tests Health Maintenance  Topic Date Due  . TETANUS/TDAP  03/13/2018  . INFLUENZA VACCINE  12/23/2019  . FOOT EXAM  03/20/2020  . HEMOGLOBIN A1C  04/04/2020  . OPHTHALMOLOGY EXAM  07/17/2020  . MAMMOGRAM  11/21/2020  . COLONOSCOPY  11/12/2021  . COVID-19 Vaccine  Completed  . Hepatitis C Screening  Completed  . PNA vac Low Risk Adult  Completed  . [place] Maintenance  Health Maintenance Due  Topic Date Due  . TETANUS/TDAP  03/13/2018  . INFLUENZA VACCINE  12/23/2019    Colorectal cancer screening: Completed 11/12/2016. Repeat every 5 years Mammogram status: Completed 11/22/2018. Repeat every year Bone Density status: Completed 01/20/2010  Lung Cancer Screening: (Low Dose CT Chest recommended if Age 86-80 years, 30 pack-year currently smoking OR have quit w/in 15years.) does not qualify.   Lung Cancer Screening Referral: no  Additional Screening:  Hepatitis C Screening: does qualify; Completed 10/03/2015  Vision Screening: Recommended annual ophthalmology exams for early detection of glaucoma and other disorders of the eye. Is the patient up to date with their annual eye exam?  Yes  Who is the provider or what is the name of the office in which the patient attends annual eye exams? Dr. Wyatt Portela If pt is not established with a provider, would they like to be referred to a provider to establish care? No .   Dental Screening: Recommended annual dental exams for proper oral hygiene  Community Resource Referral / Chronic Care Management: CRR required this visit?  No   CCM required this visit?  No      Plan:     I have personally  reviewed and noted the following in the patient's chart:   . Medical and social history . Use of alcohol, tobacco or illicit drugs  . Current medications and supplements . Functional ability and status . Nutritional status . Physical activity . Advanced directives . List of other physicians . Hospitalizations, surgeries, and ER visits in previous 12 months . Vitals . Screenings to include cognitive, depression, and falls . Referrals and appointments  In addition, I have reviewed and discussed with patient certain preventive protocols, quality metrics, and best practice recommendations. A written personalized care plan for preventive services as well as general preventive health recommendations were provided to patient.   Due to this being a telephonic visit, the after visit summary with patients personalized plan was offered to patient via mail or my-chart. Patient preferred to pick up at office at next visit.   Kellie Simmering, LPN   06/29/7122   Nurse Notes: Due for TDAP.

## 2020-01-02 ENCOUNTER — Other Ambulatory Visit: Payer: Self-pay | Admitting: Nurse Practitioner

## 2020-01-02 ENCOUNTER — Ambulatory Visit: Payer: Self-pay | Admitting: Pharmacist

## 2020-01-02 DIAGNOSIS — N289 Disorder of kidney and ureter, unspecified: Secondary | ICD-10-CM | POA: Diagnosis not present

## 2020-01-02 DIAGNOSIS — E1140 Type 2 diabetes mellitus with diabetic neuropathy, unspecified: Secondary | ICD-10-CM

## 2020-01-02 DIAGNOSIS — K219 Gastro-esophageal reflux disease without esophagitis: Secondary | ICD-10-CM | POA: Diagnosis not present

## 2020-01-02 DIAGNOSIS — E039 Hypothyroidism, unspecified: Secondary | ICD-10-CM | POA: Diagnosis not present

## 2020-01-02 DIAGNOSIS — E559 Vitamin D deficiency, unspecified: Secondary | ICD-10-CM | POA: Diagnosis not present

## 2020-01-02 DIAGNOSIS — E785 Hyperlipidemia, unspecified: Secondary | ICD-10-CM | POA: Diagnosis not present

## 2020-01-02 DIAGNOSIS — E1122 Type 2 diabetes mellitus with diabetic chronic kidney disease: Secondary | ICD-10-CM

## 2020-01-02 DIAGNOSIS — I129 Hypertensive chronic kidney disease with stage 1 through stage 4 chronic kidney disease, or unspecified chronic kidney disease: Secondary | ICD-10-CM

## 2020-01-02 DIAGNOSIS — E1165 Type 2 diabetes mellitus with hyperglycemia: Secondary | ICD-10-CM | POA: Diagnosis not present

## 2020-01-02 DIAGNOSIS — Z794 Long term (current) use of insulin: Secondary | ICD-10-CM

## 2020-01-02 LAB — VITAMIN D 25 HYDROXY (VIT D DEFICIENCY, FRACTURES): Vit D, 25-Hydroxy: 53.8

## 2020-01-02 LAB — BASIC METABOLIC PANEL
BUN: 18 (ref 4–21)
CO2: 21 (ref 13–22)
Chloride: 105 (ref 99–108)
Creatinine: 1.3 — AB (ref <=1.1)
Glucose: 251
Potassium: 4.5 (ref 3.4–5.3)
Sodium: 142 (ref 137–147)

## 2020-01-02 LAB — COMPREHENSIVE METABOLIC PANEL
Calcium: 9.7 (ref 8.7–10.7)
GFR calc Af Amer: 49
GFR calc non Af Amer: 43

## 2020-01-02 LAB — TSH: TSH: 1.71 (ref <=5.90)

## 2020-01-02 LAB — HEMOGLOBIN A1C: Hemoglobin A1C: 9.3

## 2020-01-02 MED ORDER — BUDESONIDE-FORMOTEROL FUMARATE 160-4.5 MCG/ACT IN AERO: 2.0000 | INHALATION_SPRAY | Freq: Two times a day (BID) | RESPIRATORY_TRACT | 7 refills | Status: DC

## 2020-01-02 MED ORDER — ALBUTEROL SULFATE HFA 108 (90 BASE) MCG/ACT IN AERS: 2.0000 | INHALATION_SPRAY | Freq: Four times a day (QID) | RESPIRATORY_TRACT | 7 refills | Status: DC | PRN

## 2020-01-02 NOTE — Patient Instructions (Addendum)
Visit Information  It was a pleasure speaking with you today. Thank you for letting me be part of your clinical team. Please call with any questions or concerns.   Goals Addressed              This Visit's Progress   .  PharmD "I want to get my sugars better controlled" (pt-stated)        Current Barriers:  . Diabetes: uncontrolled; complicated by HTN, HLD, CKD, OA, most recent A1c 8.6% o Reports she saw Dr. Rosario Jacks this week and had labs drawn at [facility] today. o Reports Januvia was stopped and further changes awaiting return of blood work. o Dexcom ordered by Dr. Rosario Jacks . Current antihyperglycemic regimen: Trulicity 1.5 mg weekly, Lantus 40 units BID, Humalog 10 units with breakfast,  8 units with lunch, 6  units with supper, . Current exercise: Continues to walk with her friend group "when it isn't so hot" . Current blood glucose readings:  o Fasting 113 today o Pre-supper: 220s o Before bed (~4 hours): 230s . Cardiovascular risk reduction: o Current hypertensive regimen: amlodipine 2.5 mg daily, lisinopril 20 mg daily, metoprolol tartrate 25 mg BID o Current hyperlipidemia regimen: atorvastatin 40 mg daily, last LDL not at goal <70 . GERD: deslansoprazole 60 mg daily  . Hypothyroidism: levothyroxine 88 mcg daily  . Asthma: Symbicort 160/4.5 mcg BID, albuterol HFA PRN; montelukast 10 mg daily started at last office visit; Patient reports only taking Symbicort prn with last use some time last week. She is requesting refills for Symbicort and albuterol. She would also like to request a prescription for eye drops. She reports her eyes are "itchy". Currrently using Systane without relief.  Pharmacist Clinical Goal(s):  Marland Kitchen Over the next 90 days, patient with work with PharmD and primary care provider to address optimized management of diabetes  Interventions: . Comprehensive medication review performed, medication list updated in electronic medical record . Inter-disciplinary care team  collaboration (see longitudinal plan of care) . Discussed goal A1c, goal fasting, and goal 2 hour post prandial glucose . Discussed recommendation to increase Trulicity to target goal A1c and allow for reduced insulin burden.  . Encouraged to discuss w/ endocrinologist. Providing these instructions/recommendations in the mail for her to take to endocrinology appointment . Discussed duplicative MOA of Januvia and Trulicity. Discussed recommendation to consider d/c or at least swap to SGLT2. Discussed benefit of SGLT2, such as Farxiga, on CKD progression. Encouraged to discuss w/ endocrinologist. Providing these instructions/recommendations in the mail for her to take to endocrinology appointment . Between now and endo f/u, given elevated pre-supper readings, recommended she increase lunch time Humalog to 8 units. She verbalized understanding . Discussed Medicare criteria for CGM monitoring (injecting 3+ doses of insulin, checking sugar 4+ times daily). Encouraged to increase to checking 4 times daily between now and endo f/u to allow to discuss CGM at that appointment. She verbalized understanding  Patient Self Care Activities:  . Patient will check blood glucose BID, document, and provide at future appointments . Patient will take medications as prescribed . Patient will contact provider with any episodes of hypoglycemia . Patient will report any questions or concerns to provider   Please see past updates related to this goal by clicking on the "Past Updates" button in the selected goal         The patient verbalized understanding of instructions provided today and agreed to receive a mailed copy of patient instruction and/or educational materials.  Telephone follow  up appointment with pharmacy team member scheduled for: 3 months  Junita Push. Kenton Kingfisher PharmD, BCPS Clinical Pharmacist [phone]  Mediterranean Diet A Mediterranean diet refers to food and lifestyle choices that are based on  the traditions of countries located on the [place]. This way of eating has been shown to help prevent certain conditions and improve outcomes for people who have chronic diseases, like kidney disease and heart disease. What are tips for following this plan? Lifestyle  Cook and eat meals together with your family, when possible.  Drink enough fluid to keep your urine clear or pale yellow.  Be physically active every day. This includes: ? Aerobic exercise like running or swimming. ? Leisure activities like gardening, walking, or housework.  Get 7-8 hours of sleep each night.  If recommended by your health care provider, drink red wine in moderation. This means 1 glass a day for nonpregnant women and 2 glasses a day for men. A glass of wine equals 5 oz (150 mL). Reading food labels   Check the serving size of packaged foods. For foods such as rice and pasta, the serving size refers to the amount of cooked product, not dry.  Check the total fat in packaged foods. Avoid foods that have saturated fat or trans fats.  Check the ingredients list for added sugars, such as corn syrup. Shopping  At the grocery store, buy most of your food from the areas near the walls of the store. This includes: ? Fresh fruits and vegetables (produce). ? Grains, beans, nuts, and seeds. Some of these may be available in unpackaged forms or large amounts (in bulk). ? Fresh seafood. ? Poultry and eggs. ? Low-fat dairy products.  Buy whole ingredients instead of prepackaged foods.  Buy fresh fruits and vegetables in-season from local farmers markets.  Buy frozen fruits and vegetables in resealable bags.  If you do not have access to quality fresh seafood, buy precooked frozen shrimp or canned fish, such as tuna, salmon, or sardines.  Buy small amounts of raw or cooked vegetables, salads, or olives from the deli or salad bar at your store.  Stock your pantry so you always have certain foods on  hand, such as olive oil, canned tuna, canned tomatoes, rice, pasta, and beans. Cooking  Cook foods with extra-virgin olive oil instead of using butter or other vegetable oils.  Have meat as a side dish, and have vegetables or grains as your main dish. This means having meat in small portions or adding small amounts of meat to foods like pasta or stew.  Use beans or vegetables instead of meat in common dishes like chili or lasagna.  Experiment with different cooking methods. Try roasting or broiling vegetables instead of steaming or sauteing them.  Add frozen vegetables to soups, stews, pasta, or rice.  Add nuts or seeds for added healthy fat at each meal. You can add these to yogurt, salads, or vegetable dishes.  Marinate fish or vegetables using olive oil, lemon juice, garlic, and fresh herbs. Meal planning   Plan to eat 1 vegetarian meal one day each week. Try to work up to 2 vegetarian meals, if possible.  Eat seafood 2 or more times a week.  Have healthy snacks readily available, such as: ? Vegetable sticks with hummus. ? [place] yogurt. ? Fruit and nut trail mix.  Eat balanced meals throughout the week. This includes: ? Fruit: 2-3 servings a day ? Vegetables: 4-5 servings a day ? Low-fat dairy: 2 servings a  day ? Fish, poultry, or lean meat: 1 serving a day ? Beans and legumes: 2 or more servings a week ? Nuts and seeds: 1-2 servings a day ? Whole grains: 6-8 servings a day ? Extra-virgin olive oil: 3-4 servings a day  Limit red meat and sweets to only a few servings a month What are my food choices?  Mediterranean diet ? Recommended  Grains: Whole-grain pasta. Brown rice. Bulgar wheat. Polenta. Couscous. Whole-wheat bread. [place].  Vegetables: Artichokes. Beets. Broccoli. Cabbage. Carrots. Eggplant. Green beans. Chard. Kale. Spinach. Onions. Leeks. Peas. Squash. Tomatoes. Peppers. Radishes.  Fruits: Apples. Apricots. Avocado. Berries. Bananas. Cherries.  Dates. Figs. Grapes. Lemons. Melon. Oranges. Peaches. Plums. Pomegranate.  Meats and other protein foods: Beans. Almonds. Sunflower seeds. Pine nuts. Peanuts. [place]. Salmon. Scallops. Shrimp. [place]. Tilapia. Clams. Oysters. Eggs.  Dairy: Low-fat milk. Cheese. Greek yogurt.  Beverages: Water. Red wine. Herbal tea.  Fats and oils: Extra virgin olive oil. Avocado oil. Grape seed oil.  Sweets and desserts: [place] yogurt with honey. Baked apples. Poached pears. Trail mix.  Seasoning and other foods: Basil. Cilantro. Coriander. Cumin. Mint. Parsley. Sage. Rosemary. Tarragon. Garlic. Oregano. Thyme. Pepper. Balsalmic vinegar. Tahini. Hummus. Tomato sauce. Olives. Mushrooms. ? Limit these  Grains: Prepackaged pasta or rice dishes. Prepackaged cereal with added sugar.  Vegetables: Deep fried potatoes (french fries).  Fruits: Fruit canned in syrup.  Meats and other protein foods: Beef. Pork. Lamb. Poultry with skin. Hot dogs. [place] Salines.  Dairy: Ice cream. Sour cream. Whole milk.  Beverages: Juice. Sugar-sweetened soft drinks. Beer. Liquor and spirits.  Fats and oils: Butter. Canola oil. Vegetable oil. Beef fat (tallow). Lard.  Sweets and desserts: Cookies. Cakes. Pies. Candy.  Seasoning and other foods: Mayonnaise. Premade sauces and marinades. The items listed may not be a complete list. Talk with your dietitian about what dietary choices are right for you. Summary  The Mediterranean diet includes both food and lifestyle choices.  Eat a variety of fresh fruits and vegetables, beans, nuts, seeds, and whole grains.  Limit the amount of red meat and sweets that you eat.  Talk with your health care provider about whether it is safe for you to drink red wine in moderation. This means 1 glass a day for nonpregnant women and 2 glasses a day for men. A glass of wine equals 5 oz (150 mL). This information is not intended to replace advice given to you by your health care provider. Make sure you  discuss any questions you have with your health care provider. Document Revised: 01/08/2016 Document Reviewed: 01/01/2016 Elsevier Patient Education  [place].

## 2020-01-02 NOTE — Progress Notes (Signed)
Chronic Care Management   Follow Up Note   01/02/2020 Name: Mary Parks MRN: [id number] DOB: 10-07-1950  Referred by: Venita Lick, NP Reason for referral : Chronic Care Management (dm, htn follow up)   Mary Parks is a 69 y.o. year old female who is a primary care patient of Cannady, Barbaraann Faster, NP. The CCM team was consulted for assistance with chronic disease management and care coordination needs.    Review of patient status, including review of consultants reports, relevant laboratory and other test results, and collaboration with appropriate care team members and the patient's provider was performed as part of comprehensive patient evaluation and provision of chronic care management services.    SDOH (Social Determinants of Health) assessments performed: Yes See Care Plan activities for detailed interventions related to [facility])     Outpatient Encounter Medications as of 01/02/2020  Medication Sig Note  . albuterol (PROVENTIL HFA;VENTOLIN HFA) 108 (90 Base) MCG/ACT inhaler Inhale 2 puffs into the lungs every 6 (six) hours as needed for wheezing or shortness of breath. 10/24/2018: 3 times per week  . amLODipine (NORVASC) 2.5 MG tablet Take 2.5 mg by mouth daily.   Marland Kitchen aspirin EC 81 MG tablet Take 81 mg by mouth daily.   Marland Kitchen atorvastatin (LIPITOR) 40 MG tablet Take 1 tablet (40 mg total) by mouth at bedtime.   Marland Kitchen dexlansoprazole (DEXILANT) 60 MG capsule Take 60 mg by mouth daily.   Marland Kitchen LANTUS SOLOSTAR 100 UNIT/ML Solostar Pen INJECT 40 UNITS SUBCUTANEOUSLY TWICE DAILY   . levothyroxine (SYNTHROID) 88 MCG tablet Take 88 mcg by mouth every morning.   Marland Kitchen lisinopril (ZESTRIL) 20 MG tablet Take 1 tablet by mouth once daily   . metoprolol tartrate (LOPRESSOR) 25 MG tablet Take 25 mg by mouth 2 (two) times daily.   . montelukast (SINGULAIR) 10 MG tablet Take 1 tablet (10 mg total) by mouth at bedtime.   . ONE TOUCH ULTRA TEST test strip 1 each by Other route 2 (two) times daily.    [name] DELICA LANCETS 88T MISC 2 (two) times daily.    . TRULICITY 1.5 GP/4.9IY SOPN INJECT 1.5MG  INTO THE SKIN ONCE A WEEK   . budesonide-formoterol (SYMBICORT) 160-4.5 MCG/ACT inhaler Inhale 2 puffs into the lungs 2 (two) times daily. 10/24/2018: Taking PRN  . calcium-vitamin D (OSCAL WITH D) 500-200 MG-UNIT tablet Take 1 tablet by mouth.   . fluticasone (FLONASE) 50 MCG/ACT nasal spray USE 2 SPRAY(S) IN EACH NOSTRIL ONCE DAILY   . insulin lispro (HUMALOG) 100 UNIT/ML KwikPen INJECT 20 UNITS SUBCUTANEOUSLY BEFORE BREAKFAST AND LUNCH, AND 30 UNITS BEFORE EVENING MEAL (Patient taking differently: INJECT 20 UNITS SUBCUTANEOUSLY BEFORE BREAKFAST AND LUNCH, AND 30 UNITS BEFORE EVENING MEAL Patient taking differently: 10 u before breakfast, 8 u before lunch, 6 u before evening meal)   . meclizine (ANTIVERT) 12.5 MG tablet Take 1 tablet (12.5 mg total) by mouth 3 (three) times daily as needed for dizziness.   Marland Kitchen omeprazole (PRILOSEC) 20 MG capsule Take 1 capsule (20 mg total) by mouth daily. (Patient not taking: Reported on 12/28/2019)   . sitaGLIPtin (JANUVIA) 100 MG tablet Take 100 mg by mouth daily. (Patient not taking: Reported on 12/28/2019)    No facility-administered encounter medications on file as of 01/02/2020.     Objective:   Goals Addressed              This Visit's Progress   .  PharmD "I want to get my sugars  better controlled" (pt-stated)        Current Barriers:  . Diabetes: uncontrolled; complicated by HTN, HLD, CKD, OA, most recent A1c 8.6% o Reports she saw Dr. Rosario Jacks this week and had labs drawn at [facility] today. o Reports Januvia was stopped and further changes awaiting return of blood work. o Dexcom ordered by Dr. Rosario Jacks . Current antihyperglycemic regimen: Trulicity 1.5 mg weekly, Lantus 40 units BID, Humalog 10 units with breakfast,  8 units with lunch, 6  units with supper, . Current exercise: Continues to walk with her friend group "when it isn't so hot" . Current blood  glucose readings:  o Fasting 113 today o Pre-supper: 220s o Before bed (~4 hours): 230s . Cardiovascular risk reduction: o Current hypertensive regimen: amlodipine 2.5 mg daily, lisinopril 20 mg daily, metoprolol tartrate 25 mg BID o Current hyperlipidemia regimen: atorvastatin 40 mg daily, last LDL not at goal <70 . GERD: deslansoprazole 60 mg daily  . Hypothyroidism: levothyroxine 88 mcg daily  . Asthma: Symbicort 160/4.5 mcg BID, albuterol HFA PRN; montelukast 10 mg daily started at last office visit; Patient reports only taking Symbicort prn with last use some time last week. She is requesting refills for Symbicort and albuterol. She would also like to request a prescription for eye drops. She reports her eyes are "itchy". Currrently using Systane without relief.  Pharmacist Clinical Goal(s):  Marland Kitchen Over the next 90 days, patient with work with PharmD and primary care provider to address optimized management of diabetes  Interventions: . Comprehensive medication review performed, medication list updated in electronic medical record . Inter-disciplinary care team collaboration (see longitudinal plan of care) . Discussed goal A1c, goal fasting, and goal 2 hour post prandial glucose . Discussed recommendation to increase Trulicity to target goal A1c and allow for reduced insulin burden.  . Encouraged to discuss w/ endocrinologist. Providing these instructions/recommendations in the mail for her to take to endocrinology appointment . Discussed duplicative MOA of Januvia and Trulicity. Discussed recommendation to consider d/c or at least swap to SGLT2. Discussed benefit of SGLT2, such as Farxiga, on CKD progression. Encouraged to discuss w/ endocrinologist. Providing these instructions/recommendations in the mail for her to take to endocrinology appointment . Between now and endo f/u, given elevated pre-supper readings, recommended she increase lunch time Humalog to 8 units. She verbalized  understanding . Discussed Medicare criteria for CGM monitoring (injecting 3+ doses of insulin, checking sugar 4+ times daily). Encouraged to increase to checking 4 times daily between now and endo f/u to allow to discuss CGM at that appointment. She verbalized understanding  Patient Self Care Activities:  . Patient will check blood glucose BID, document, and provide at future appointments . Patient will take medications as prescribed . Patient will contact provider with any episodes of hypoglycemia . Patient will report any questions or concerns to provider   Please see past updates related to this goal by clicking on the "Past Updates" button in the selected goal          Plan:   Telephone follow up appointment  scheduled for: 3 months  Junita Push. Kenton Kingfisher PharmD, [facility] [phone]

## 2020-01-11 ENCOUNTER — Other Ambulatory Visit: Payer: Self-pay

## 2020-01-11 ENCOUNTER — Encounter: Payer: Self-pay | Admitting: Nurse Practitioner

## 2020-01-11 ENCOUNTER — Ambulatory Visit (INDEPENDENT_AMBULATORY_CARE_PROVIDER_SITE_OTHER): Payer: Medicare Other | Admitting: Nurse Practitioner

## 2020-01-11 VITALS — BP 111/69 | HR 74 | Temp 98.3°F | Wt 182.8 lb

## 2020-01-11 DIAGNOSIS — Z23 Encounter for immunization: Secondary | ICD-10-CM

## 2020-01-11 DIAGNOSIS — E1140 Type 2 diabetes mellitus with diabetic neuropathy, unspecified: Secondary | ICD-10-CM

## 2020-01-11 DIAGNOSIS — E0821 Diabetes mellitus due to underlying condition with diabetic nephropathy: Secondary | ICD-10-CM | POA: Diagnosis not present

## 2020-01-11 DIAGNOSIS — E890 Postprocedural hypothyroidism: Secondary | ICD-10-CM

## 2020-01-11 DIAGNOSIS — N1831 Chronic kidney disease, stage 3a: Secondary | ICD-10-CM

## 2020-01-11 DIAGNOSIS — I129 Hypertensive chronic kidney disease with stage 1 through stage 4 chronic kidney disease, or unspecified chronic kidney disease: Secondary | ICD-10-CM

## 2020-01-11 DIAGNOSIS — E1169 Type 2 diabetes mellitus with other specified complication: Secondary | ICD-10-CM | POA: Diagnosis not present

## 2020-01-11 DIAGNOSIS — E785 Hyperlipidemia, unspecified: Secondary | ICD-10-CM

## 2020-01-11 DIAGNOSIS — E6609 Other obesity due to excess calories: Secondary | ICD-10-CM

## 2020-01-11 DIAGNOSIS — Z794 Long term (current) use of insulin: Secondary | ICD-10-CM

## 2020-01-11 DIAGNOSIS — Z6831 Body mass index (BMI) 31.0-31.9, adult: Secondary | ICD-10-CM

## 2020-01-11 DIAGNOSIS — E1122 Type 2 diabetes mellitus with diabetic chronic kidney disease: Secondary | ICD-10-CM | POA: Diagnosis not present

## 2020-01-11 MED ORDER — FLUTICASONE PROPIONATE 50 MCG/ACT NA SUSP: NASAL | 3 refills | Status: DC

## 2020-01-11 NOTE — Assessment & Plan Note (Signed)
Recommended eating smaller high protein, low fat meals more frequently and exercising 30 mins a day 5 times a week with a goal of 10-15lb weight loss in the next 3 months. Patient voiced their understanding and motivation to adhere to these recommendations.  

## 2020-01-11 NOTE — Patient Instructions (Signed)

## 2020-01-11 NOTE — Progress Notes (Signed)
BP 111/69   Pulse 74   Temp 98.3 F (36.8 C) (Oral)   Wt 182 lb 12.8 oz (82.9 kg)   LMP  (LMP Unknown)   SpO2 98%   BMI 31.38 kg/m    Subjective:    Patient ID: Mary Parks, female    DOB: 14-Jul-1950, 69 y.o.   MRN: [id number]  HPI: Mary Parks is a 69 y.o. female  Chief Complaint  Patient presents with  . Diabetes  . Hyperlipidemia  . Hypertension  . Hypothyroidism   DIABETES Followed by Dr. Rosario Jacks, saw him 01/02/20.  A1C was elevated at 9.3%, Januvia was stopped -- may put on something else. Goes back to see him in .  Currently taking Trulicity and Lantus (40 units twice daily) and Humalog 10 in morning, 8 lunch, and 6 in evening. Review of chart over past year A1C has remained in 8-9 range.  Hypoglycemic episodes: none Polydipsia/polyuria: no Visual disturbance: no Chest pain: no Paresthesias: no Glucose Monitoring: yes             Accucheck frequency: BID             Fasting glucose: 119 to 130  -- occasional 90             Post prandial:             Evening: 200 range             Before meals: Taking Insulin?: yes             Long acting insulin: 40 units Lantus BID             Short acting insulin: Humalog 10 in morning, 8 lunch, and 6 in evening Blood Pressure Monitoring: a few times a week Retinal Examination: Not Up To Date Foot Exam: Up to Date Pneumovax: Up to Date Influenza: Up to Date Aspirin: yes  HYPOTHYROIDISM Followed by endocrinology, taking 88 MCG Levothyroxine.  Last TSH 1.710, Free T4 1.38. Thyroid control status:stable Satisfied with current treatment? yes Medication side effects: no Medication compliance: good compliance Etiology of hypothyroidism:  Recent dose adjustment:no Fatigue: no Cold intolerance: no Heat intolerance: no Weight gain: no Weight loss: no Constipation: no Diarrhea/loose stools: no Palpitations: no Lower extremity edema: no Anxiety/depressed mood: no   CHRONIC KIDNEY DISEASE Goes to nephrology.   Continues on Lisinopril. August 2021 labs CRT 1.27 and GFR 49, BUN 18. CKD status: stable Medications renally dose: yes Previous renal evaluation: yes Pneumovax:  Up to Date Influenza Vaccine:  Up to Date  HYPERTENSION / HYPERLIPIDEMIA Current medications include Metoprolol, Lisinopril, and Amlodipine for HTN + Lipitor for HLD.  Sees nephrology at [facility], last saw in May and no changes made. Satisfied with current treatment? yes Duration of hypertension: chronic BP monitoring frequency: a few times a week BP range: 120-130/80's BP medication side effects: no Duration of hyperlipidemia: chronic Cholesterol medication side effects: no Cholesterol supplements: none Medication compliance: good compliance Aspirin: yes Recent stressors: no Recurrent headaches: no Visual changes: no Palpitations: no Dyspnea: no Chest pain: no Lower extremity edema: no Dizzy/lightheaded: no   CUT TO TOE: Reports cut to left great toe recently, no pain or inflammation.  No drainage.  She is not up to date on Tetanus.     Relevant past medical, surgical, family and social history reviewed and updated as indicated. Interim medical history since our last visit reviewed. Allergies and medications reviewed and updated.  Review of Systems  Constitutional: Negative for activity change, appetite change, diaphoresis, fatigue and fever.  Respiratory: Negative for cough, chest tightness and shortness of breath.   Cardiovascular: Negative for chest pain, palpitations and leg swelling.  Gastrointestinal: Negative.   Endocrine: Negative for cold intolerance, heat intolerance, polydipsia, polyphagia and polyuria.  Neurological: Negative.   Psychiatric/Behavioral: Negative.     Per HPI unless specifically indicated above     Objective:    BP 111/69   Pulse 74   Temp 98.3 F (36.8 C) (Oral)   Wt 182 lb 12.8 oz (82.9 kg)   LMP  (LMP Unknown)   SpO2 98%   BMI 31.38 kg/m   Wt Readings from Last 3  Encounters:  01/11/20 182 lb 12.8 oz (82.9 kg)  12/28/19 183 lb (83 kg)  10/03/19 186 lb (84.4 kg)    Physical Exam Vitals and nursing note reviewed.  Constitutional:      General: She is awake. She is not in acute distress.    Appearance: She is well-developed and well-groomed. She is obese. She is not ill-appearing.  HENT:     Head: Normocephalic.     Right Ear: Hearing, tympanic membrane, ear canal and external ear normal.     Left Ear: Hearing, tympanic membrane, ear canal and external ear normal.     Nose: Nose normal.     Right Sinus: No maxillary sinus tenderness or frontal sinus tenderness.     Left Sinus: No maxillary sinus tenderness or frontal sinus tenderness.     Mouth/Throat:     Mouth: Mucous membranes are moist.     Pharynx: Oropharynx is clear. Uvula midline. No posterior oropharyngeal erythema.  Eyes:     General: Lids are normal.        Right eye: No discharge.        Left eye: No discharge.     Conjunctiva/sclera: Conjunctivae normal.     Pupils: Pupils are equal, round, and reactive to light.  Neck:     Vascular: No carotid bruit.  Cardiovascular:     Rate and Rhythm: Normal rate and regular rhythm.     Heart sounds: Normal heart sounds.  Pulmonary:     Effort: Pulmonary effort is normal. No accessory muscle usage or respiratory distress.     Breath sounds: Normal breath sounds.  Abdominal:     General: Bowel sounds are normal.     Palpations: Abdomen is soft.  Musculoskeletal:     Cervical back: Normal range of motion and neck supple.     Right lower leg: No edema.     Left lower leg: No edema.  Lymphadenopathy:     Cervical: No cervical adenopathy.  Skin:    General: Skin is warm and dry.  Neurological:     Mental Status: She is alert and oriented to person, place, and time.     Cranial Nerves: Cranial nerves are intact.     Motor: Motor function is intact.     Coordination: Coordination is intact.     Gait: Gait is intact.     Deep Tendon  Reflexes:     Reflex Scores:      Brachioradialis reflexes are 2+ on the right side and 2+ on the left side.      Patellar reflexes are 2+ on the right side and 2+ on the left side. Psychiatric:        Attention and Perception: Attention normal.        Mood and Affect: Mood normal.  Speech: Speech normal.        Behavior: Behavior normal. Behavior is cooperative.        Thought Content: Thought content normal.     Results for orders placed or performed in visit on 22/97/98  Basic metabolic panel  Result Value Ref Range   Glucose 164 (H) 65 - 99 mg/dL   BUN 13 8 - 27 mg/dL   Creatinine, Ser 1.13 (H) 0.57 - 1.00 mg/dL   GFR calc non Af Amer 49 (L) >59 mL/min/1.73   GFR calc Af Amer 57 (L) >59 mL/min/1.73   BUN/Creatinine Ratio 12 12 - 28   Sodium 145 (H) 134 - 144 mmol/L   Potassium 4.0 3.5 - 5.2 mmol/L   Chloride 108 (H) 96 - 106 mmol/L   CO2 23 20 - 29 mmol/L   Calcium 9.0 8.7 - 10.3 mg/dL  Microalbumin, Urine Waived  Result Value Ref Range   Microalb, Ur Waived 150 (H) 0 - 19 mg/L   Creatinine, Urine Waived 200 10 - 300 mg/dL   Microalb/Creat Ratio 30-300 (H) <30 mg/g  Lipid Panel w/o Chol/HDL Ratio  Result Value Ref Range   Cholesterol, Total 145 100 - 199 mg/dL   Triglycerides 122 0 - 149 mg/dL   HDL 37 (L) >39 mg/dL   VLDL Cholesterol Cal 22 5 - 40 mg/dL   LDL Chol Calc (NIH) 86 0 - 99 mg/dL  Bayer DCA Hb A1c Waived  Result Value Ref Range   HB A1C (BAYER DCA - WAIVED) 8.6 (H) <7.0 %      Assessment & Plan:   Problem List Items Addressed This Visit      Cardiovascular and Mediastinum   Hypertension 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  Continue current medication regimen, Lisinopril for kidney protection with diabetes and collaboration with [facility] nephrology.  Recommend she monitor BP at home at least 3 mornings a week.  Reviewed recent endo labs.  Focus on DASH diet at home.   Return in 6 months.        Endocrine   Diabetes mellitus with chronic kidney disease ([place]) - Primary    Chronic, ongoing.  A1C with A1C 9.3% last week with endo, slight upward trend, recommend focus on diet.  Urine ALB 150 and A:C 30-300 last visit.  Continue current medication regimen and collaboration with endocrinology and nephrology.  BMP with endo reviewed, continue Lisinopril for kidney protection.  Return in 6 months.  May benefit from trial of [facility] in future.      Relevant Orders   Bayer DCA Hb A1c Waived   Basic metabolic panel   Type 2 diabetes mellitus with diabetic neuropathy, with long-term current use of insulin (HCC)    Chronic, ongoing.  A1C with recent endo A1C 9.3% and last 8.6%.  Continue current medication regimen and collaboration with endocrinology.  BMP from endo reviewed, continue Lisinopril for kidney protection.  Recommend patient continue to monitor BS at home.  Return in 6 months, since is seeing endo every 3 months.      Hyperlipidemia associated with type 2 diabetes mellitus (HCC)    Chronic, ongoing.  Continue current medication regimen and adjust as needed.  Lipid next visit.          Genitourinary   Chronic kidney disease, stage 3    Chronic, ongoing.  Continue collaboration with [facility] nephrology.  Lisinopril for kidney  protection.  Recent BMP with endo reviewed.        Other   S/P total thyroidectomy    Chronic, ongoing followed by Dr. Rosario Jacks with endo.  Continue current medication regimen and adjust as needed.  Current adjustments made by endo.  Recent endo labs reviewed.       Obesity    Recommended eating smaller high protein, low fat meals more frequently and exercising 30 mins a day 5 times a week with a goal of 10-15lb weight loss in the next 3 months. Patient voiced their understanding and motivation to adhere to these recommendations.           Follow up plan: Return in about 6 months (around 07/13/2020) for T2DM, HTN/HLD,  Thyroid.

## 2020-01-11 NOTE — Assessment & Plan Note (Signed)
Chronic, ongoing.  Continue collaboration with [facility] nephrology.  Lisinopril for kidney protection.  Recent BMP with endo reviewed.

## 2020-01-11 NOTE — Assessment & Plan Note (Addendum)
Chronic, stable with BP at goal today.  Continue current medication regimen, Lisinopril for kidney protection with diabetes and collaboration with [facility] nephrology.  Recommend she monitor BP at home at least 3 mornings a week.  Reviewed recent endo labs.  Focus on DASH diet at home.  Return in 6 months.

## 2020-01-11 NOTE — Assessment & Plan Note (Signed)
Chronic, ongoing.  A1C with recent endo A1C 9.3% and last 8.6%.  Continue current medication regimen and collaboration with endocrinology.  BMP from endo reviewed, continue Lisinopril for kidney protection.  Recommend patient continue to monitor BS at home.  Return in 6 months, since is seeing endo every 3 months.

## 2020-01-11 NOTE — Assessment & Plan Note (Signed)
Chronic, ongoing followed by Dr. Rosario Jacks with endo.  Continue current medication regimen and adjust as needed.  Current adjustments made by endo.  Recent endo labs reviewed.

## 2020-01-11 NOTE — Assessment & Plan Note (Signed)
Chronic, ongoing.  A1C with A1C 9.3% last week with endo, slight upward trend, recommend focus on diet.  Urine ALB 150 and A:C 30-300 last visit.  Continue current medication regimen and collaboration with endocrinology and nephrology.  BMP with endo reviewed, continue Lisinopril for kidney protection.  Return in 6 months.  May benefit from trial of [facility] in future.

## 2020-01-11 NOTE — Assessment & Plan Note (Signed)
Chronic, ongoing.  Continue current medication regimen and adjust as needed.  Lipid next visit.

## 2020-01-14 ENCOUNTER — Ambulatory Visit
Admission: RE | Admit: 2020-01-14 | Discharge: 2020-01-14 | Disposition: A | Discharge status: Home / Self Care | Payer: Medicare Other | Source: Ambulatory Visit | Attending: Nurse Practitioner | Admitting: Nurse Practitioner

## 2020-01-14 ENCOUNTER — Other Ambulatory Visit: Payer: Self-pay

## 2020-01-14 DIAGNOSIS — Z1231 Encounter for screening mammogram for malignant neoplasm of breast: Secondary | ICD-10-CM

## 2020-01-23 ENCOUNTER — Other Ambulatory Visit: Payer: Self-pay | Admitting: Nurse Practitioner

## 2020-02-08 ENCOUNTER — Other Ambulatory Visit: Payer: Self-pay | Admitting: Nurse Practitioner

## 2020-03-02 ENCOUNTER — Other Ambulatory Visit: Payer: Self-pay | Admitting: Nurse Practitioner

## 2020-03-02 NOTE — Telephone Encounter (Signed)
Requested Prescriptions  Pending Prescriptions Disp Refills  . LANTUS SOLOSTAR 100 UNIT/ML Solostar Pen [Pharmacy Med Name: Lantus SoloStar 100 UNIT/ML Subcutaneous Solution Pen-injector] 30 mL 0    Sig: INJECT [place] DAILY     Endocrinology:  Diabetes - Insulins Failed - 03/02/2020  3:09 PM      Failed - HBA1C is between 0 and 7.9 and within 180 days    Hemoglobin A1C  Date Value Ref Range Status  01/02/2020 9.3  Final         Passed - Valid encounter within last 6 months    Recent Outpatient Visits          1 month ago 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, with long-term current use of insulin ([place])   [place], Jolene T, NP   5 months ago 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, with long-term current use of insulin ([place])   [place], Jolene T, NP   8 months ago Type 2 diabetes mellitus with diabetic neuropathy, with long-term current use of insulin ([place])   [place], Jolene T, NP   11 months ago 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, with long-term current use of insulin ([place])   [place], [place] T, NP   1 year ago Diabetes mellitus due to underlying condition with stage 3 chronic kidney disease, with long-term current use of insulin ([place])   [place], Barbaraann Faster, NP      Future Appointments            In 4 months Cannady, Barbaraann Faster, NP [facility]   In 10 months  [facility]           . insulin lispro (HUMALOG) 100 UNIT/ML KwikPen [Pharmacy Med Name: Insulin Lispro (1 Unit Dial) 100 UNIT/ML Subcutaneous Solution Pen-injector] 15 mL 3    Sig: INJECT 20 UNITS SUBCTUANEOUSLY BEFORE BREAKFAST AND LUNCH, AND 30 UNITS BEFORE EVENING MEAL.     Endocrinology:  Diabetes - Insulins  Failed - 03/02/2020  3:09 PM      Failed - HBA1C is between 0 and 7.9 and within 180 days    Hemoglobin A1C  Date Value Ref Range Status  01/02/2020 9.3  Final         Passed - Valid encounter within last 6 months    Recent Outpatient Visits          1 month ago 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, with long-term current use of insulin ([place])   [place], [place] T, NP   5 months ago 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, with long-term current use of insulin ([place])   [place], [place] T, NP   8 months ago Type 2 diabetes mellitus with diabetic neuropathy, with long-term current use of insulin ([place])   [place], Jolene T, NP   11 months ago 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, with long-term current use of insulin ([place])   [place], [place] T, NP   1 year ago Diabetes mellitus due to underlying condition with stage 3 chronic kidney disease, with long-term current use of insulin ([place])   [place],  Barbaraann Faster, NP      Future Appointments            In 4 months Cannady, Barbaraann Faster, NP [facility]   In 10 months  [facility]

## 2020-03-14 DIAGNOSIS — E785 Hyperlipidemia, unspecified: Secondary | ICD-10-CM | POA: Diagnosis not present

## 2020-03-14 DIAGNOSIS — E039 Hypothyroidism, unspecified: Secondary | ICD-10-CM | POA: Diagnosis not present

## 2020-03-14 DIAGNOSIS — E559 Vitamin D deficiency, unspecified: Secondary | ICD-10-CM | POA: Diagnosis not present

## 2020-03-14 DIAGNOSIS — E1165 Type 2 diabetes mellitus with hyperglycemia: Secondary | ICD-10-CM | POA: Diagnosis not present

## 2020-03-14 DIAGNOSIS — I10 Essential (primary) hypertension: Secondary | ICD-10-CM | POA: Diagnosis not present

## 2020-03-19 DIAGNOSIS — E039 Hypothyroidism, unspecified: Secondary | ICD-10-CM | POA: Diagnosis not present

## 2020-03-19 DIAGNOSIS — E559 Vitamin D deficiency, unspecified: Secondary | ICD-10-CM | POA: Diagnosis not present

## 2020-03-19 DIAGNOSIS — E785 Hyperlipidemia, unspecified: Secondary | ICD-10-CM | POA: Diagnosis not present

## 2020-03-19 DIAGNOSIS — E1165 Type 2 diabetes mellitus with hyperglycemia: Secondary | ICD-10-CM | POA: Diagnosis not present

## 2020-03-19 DIAGNOSIS — I010 Acute rheumatic pericarditis: Secondary | ICD-10-CM | POA: Diagnosis not present

## 2020-04-04 ENCOUNTER — Ambulatory Visit: Payer: Self-pay | Admitting: Pharmacist

## 2020-04-04 NOTE — Progress Notes (Signed)
Chronic Care Management Pharmacy  Name: Mary Parks  MRN: [id number] DOB: September 07, 1950   Chief Complaint/ HPI  Mary Parks,  69 y.o. , female presents for their Follow-Up CCM visit with the clinical pharmacist via telephone.  PCP : Venita Lick, NP Patient Care Team: Venita Lick, NP as PCP - General (Nurse Practitioner) [place], Thomasena Edis, MD as Referring Physician (Nephrology) Vladimir Faster, [facility] as Pharmacist (Pharmacist)  Their chronic conditions include: Hypertension, Hyperlipidemia, Diabetes,? Asthma, Chronic Kidney Disease, Hypothyroidism s/p total thyroidectomy  and Allergic Rhinitis   Office Visits: 01/11/20- [name], NP- blood work    Allergies  Allergen Reactions   Celebrex [Celecoxib]     hallucinations   Sulfa Antibiotics    Sulfisoxazole Rash   Sulfur Itching    Medications: Outpatient Encounter Medications as of 04/04/2020  Medication Sig   albuterol (VENTOLIN HFA) 108 (90 Base) MCG/ACT inhaler Inhale 2 puffs into the lungs every 6 (six) hours as needed for wheezing or shortness of breath.   amLODipine (NORVASC) 2.5 MG tablet Take 2.5 mg by mouth daily.   aspirin EC 81 MG tablet Take 81 mg by mouth daily.   budesonide-formoterol (SYMBICORT) 160-4.5 MCG/ACT inhaler Inhale 2 puffs into the lungs 2 (two) times daily.   calcium-vitamin D (OSCAL WITH D) 500-200 MG-UNIT tablet Take 1 tablet by mouth.   dexlansoprazole (DEXILANT) 60 MG capsule Take 60 mg by mouth daily.   fluticasone (FLONASE) 50 MCG/ACT nasal spray USE 2 SPRAY(S) IN EACH NOSTRIL ONCE DAILY   levothyroxine (SYNTHROID) 88 MCG tablet Take 88 mcg by mouth every morning.   meclizine (ANTIVERT) 12.5 MG tablet Take 1 tablet (12.5 mg total) by mouth 3 (three) times daily as needed for dizziness.   metoprolol tartrate (LOPRESSOR) 25 MG tablet Take 25 mg by mouth 2 (two) times daily.   montelukast (SINGULAIR) 10 MG tablet Take 1 tablet (10 mg total) by mouth at  bedtime.   ONE TOUCH ULTRA TEST test strip 1 each by Other route 2 (two) times daily.    ONETOUCH DELICA LANCETS 92E MISC 2 (two) times daily.    [DISCONTINUED] atorvastatin (LIPITOR) 40 MG tablet Take 1 tablet (40 mg total) by mouth at bedtime.   [DISCONTINUED] insulin lispro (HUMALOG) 100 UNIT/ML KwikPen INJECT 20 UNITS SUBCTUANEOUSLY BEFORE BREAKFAST AND LUNCH, AND 30 UNITS BEFORE EVENING MEAL.   [DISCONTINUED] LANTUS SOLOSTAR 100 UNIT/ML Solostar Pen INJECT 40 UNITS SUBCUTANEOUSLY TWICE DAILY   [DISCONTINUED] lisinopril (ZESTRIL) 20 MG tablet Take 1 tablet by mouth once daily   [DISCONTINUED] TRULICITY 1.5 FE/0.7HQ SOPN INJECT 1.5MG INTO THE SKIN ONCE A WEEK   No facility-administered encounter medications on file as of 04/04/2020.    Wt Readings from Last 3 Encounters:  06/03/20 183 lb (83 kg)  04/30/20 179 lb 6.4 oz (81.4 kg)  04/09/20 179 lb 12.8 oz (81.6 kg)    Current Diagnosis/Assessment:    Goals Addressed            This Visit's Progress    Pharmacy Care Plan       CARE PLAN ENTRY (see longitudinal plan of care for additional care plan information)  Current Barriers:   Chronic Disease Management support, education, and care coordination needs related to Hypertension, Hyperlipidemia, Diabetes, COPD, Chronic Kidney Disease, Osteoarthritis, and Allergic Rhinitis   Hypertension BP Readings from Last 3 Encounters:  04/09/20 115/73  04/06/20 125/65  01/11/20 111/69    Pharmacist Clinical Goal(s): o Over the next 60 days, patient  patient will work with PharmD and providers to maintain BP goal <130/80  Current regimen:   Lisinopril 20 mg qd  Metoprolol Tartrate 25 mg bid    Amlodipine 2.5 mg qd  Interventions: o Reviewed proper BP technique   Patient self care activities - Over the next 60 days, patient will: o Check BP daily, document, and provide at future appointments o Ensure daily salt intake < 2300 mg/day   Diabetes Lab Results  Component  Value Date/Time   HGBA1C 8.7 (H) 04/06/2020 01:33 AM   HGBA1C 9.3 01/02/2020 12:00 AM   HGBA1C 8.6 (H) 10/03/2019 10:41 AM   HGBA1C 8.4 (H) 11/29/2018 10:28 AM   HGBA1C 9.7 01/05/2016 12:00 AM    Pharmacist Clinical Goal(s): o Over the next 60 days, patient will work with PharmD and providers to achieve A1c goal <7%  Current regimen:   Lispro 8,6,6 units with meals  Lantus 44 units bid  Trulicity 1.5 mg weekly on Mondays  Interventions: o Provided diet and exercise counseling. o Reviewed goal glucose readings for an A1c of <7%, we want to see fasting sugars <130 and 2 hour after meal sugars <180.   Patient self care activities - Over the next 60 days, patient will: o Check blood sugar twice daily, document, and provide at future appointments o Contact provider with any episodes of hypoglycemia    Medication management  Pharmacist Clinical Goal(s): o Over the next 60 days, patient will work with PharmD and providers to maintain optimal medication adherence  Current pharmacy: [facility]  Interventions o Comprehensive medication review performed. o Continue current medication management strategy  Patient self care activities - Over the next 60 days, patient will: o Focus on medication adherence by fill dates o Take medications as prescribed o Report any questions or concerns to PharmD and/or provider(s)  Initial goal documentation        Diabetes   A1c goal <7%  Recent Relevant Labs: Lab Results  Component Value Date/Time   HGBA1C 8.7 (H) 04/06/2020 01:33 AM   HGBA1C 9.3 01/02/2020 12:00 AM   HGBA1C 8.6 (H) 10/03/2019 10:41 AM   HGBA1C 8.4 (H) 11/29/2018 10:28 AM   HGBA1C 9.7 01/05/2016 12:00 AM   MICROALBUR 150 (H) 10/03/2019 10:41 AM   MICROALBUR 150 (H) 11/29/2018 10:28 AM    Last diabetic Eye exam:  Lab Results  Component Value Date/Time   HMDIABEYEEXA No Retinopathy 07/18/2019 12:00 AM    Last diabetic Foot exam:  Lab Results  Component Value  Date/Time   HMDIABFOOTEX bilateral- normal 06/28/2016 12:00 AM     Checking BG: 2x per Day  Recent FBG Readings: 130s  Recent pre-meal BG readings: 160-200 (before supper)   Patient has failed these meds in past:  Patient is currently uncontrolled on the following medications:  Lispro 8,6,6 units with meals  Lantus 44 units bid  Trulicity 1.5 mg weekly on Mondays  We discussed: diet and exercise extensively and how to recognize and treat signs of hypoglycemia. Patient walks with a friend group weather permitting. She denies any missed doses or symptoms of hypoglycemia. Patient follows with Dr. Rosario Jacks endocrinology. He has ordered Dexcom for patient. Encouraged her to discuss addition of Farxiga and increased dose of Trulicity at next visit.   Plan  Continue current medications  Hypertension/CKD III   BP goal is:  <130/80  Office blood pressures are   BP Readings from Last 3 Encounters:  04/09/20 115/73  04/06/20 125/65  01/11/20 111/69   BMP Latest  Ref Rng & Units 04/09/2020 04/06/2020 04/05/2020  Glucose 65 - 99 mg/dL 86 115(H) 185(H)  BUN 8 - 27 mg/dL _0 Creatinine 0.57 - 1.00 mg/dL 1.04(H) 1.14(H) 1.34(H)  BUN/Creat Ratio 12 - 28 8(L) - -  Sodium 134 - 144 mmol/L 143 136 136  Potassium 3.5 - 5.2 mmol/L 4.1 3.7 3.8  Chloride 96 - 106 mmol/L 109(H) 108 105  CO2 20 - 29 mmol/L 19(L) 22 22  Calcium 8.7 - 10.3 mg/dL 9.1 8.0(L) 8.6(L)  eGFR~49  Patient checks BP at home infrequently Patient home BP readings are ranging: Unknown  Patient has failed these meds in the past: NA Patient is currently controlled on the following medications:   Lisinopril 20 mg qd  Metoprolol Tartrate 25 mg bid    Amlodipine 2.5 mg qd  We discussed Diet and exercise. Low sodium food choices. Patient denies any missed doses. Encouraged her to check BP regularly at home and reviewed proper technique.     Plan  Continue current medications      Asthma/ Allergic Rhinitis    Last spirometry score: 2018  Gold Grade: Gold 2 (FEV1 50-79%)   Lab Results  Component Value Date/Time   EOSPCT 0 04/05/2020 10:00 PM   EOSABS 0.1 04/09/2020 01:58 PM    Patient has failed these meds in past: NA Patient is currently controlled on the following medications:   Albuterol 2 puffs q6h prn  Symbicort 160/4.16mg 2 puffs bid  Fluticasone Nasal Spray 2 sprays EN qd  Montelukast 10 mg qd   Using maintenance inhaler regularly? Yes Frequency of rescue inhaler use:  1-2x per week  We discussed:  proper inhaler technique  Plan  Continue current medications    Medication Management   [place] Uses pill box? Yes Pt endorses 95% compliance    Plan  Continue current medication management strategy    Follow up: 2 month phone visit  JJunita Push HKenton KingfisherPharmD, [facility]will[facility] Practice [phone]

## 2020-04-05 ENCOUNTER — Encounter: Payer: Self-pay | Admitting: Emergency Medicine

## 2020-04-05 ENCOUNTER — Other Ambulatory Visit: Payer: Self-pay

## 2020-04-05 ENCOUNTER — Emergency Department: Payer: Medicare Other

## 2020-04-05 ENCOUNTER — Observation Stay
Admission: EM | Admit: 2020-04-05 | Discharge: 2020-04-06 | Disposition: A | Discharge status: Home / Self Care | Payer: Medicare Other | Attending: Internal Medicine | Admitting: Internal Medicine

## 2020-04-05 DIAGNOSIS — R55 Syncope and collapse: Principal | ICD-10-CM | POA: Diagnosis present

## 2020-04-05 DIAGNOSIS — Z743 Need for continuous supervision: Secondary | ICD-10-CM | POA: Diagnosis not present

## 2020-04-05 DIAGNOSIS — Z7982 Long term (current) use of aspirin: Secondary | ICD-10-CM | POA: Insufficient documentation

## 2020-04-05 DIAGNOSIS — I129 Hypertensive chronic kidney disease with stage 1 through stage 4 chronic kidney disease, or unspecified chronic kidney disease: Secondary | ICD-10-CM | POA: Diagnosis not present

## 2020-04-05 DIAGNOSIS — R197 Diarrhea, unspecified: Secondary | ICD-10-CM | POA: Diagnosis not present

## 2020-04-05 DIAGNOSIS — M47817 Spondylosis without myelopathy or radiculopathy, lumbosacral region: Secondary | ICD-10-CM | POA: Diagnosis not present

## 2020-04-05 DIAGNOSIS — E890 Postprocedural hypothyroidism: Secondary | ICD-10-CM | POA: Diagnosis not present

## 2020-04-05 DIAGNOSIS — R42 Dizziness and giddiness: Secondary | ICD-10-CM | POA: Diagnosis not present

## 2020-04-05 DIAGNOSIS — J45909 Unspecified asthma, uncomplicated: Secondary | ICD-10-CM | POA: Diagnosis not present

## 2020-04-05 DIAGNOSIS — J449 Chronic obstructive pulmonary disease, unspecified: Secondary | ICD-10-CM | POA: Diagnosis present

## 2020-04-05 DIAGNOSIS — E1140 Type 2 diabetes mellitus with diabetic neuropathy, unspecified: Secondary | ICD-10-CM | POA: Diagnosis not present

## 2020-04-05 DIAGNOSIS — Z20822 Contact with and (suspected) exposure to covid-19: Secondary | ICD-10-CM | POA: Diagnosis not present

## 2020-04-05 DIAGNOSIS — Z955 Presence of coronary angioplasty implant and graft: Secondary | ICD-10-CM | POA: Diagnosis not present

## 2020-04-05 DIAGNOSIS — N1831 Chronic kidney disease, stage 3a: Secondary | ICD-10-CM

## 2020-04-05 DIAGNOSIS — R404 Transient alteration of awareness: Secondary | ICD-10-CM | POA: Diagnosis not present

## 2020-04-05 DIAGNOSIS — E1122 Type 2 diabetes mellitus with diabetic chronic kidney disease: Secondary | ICD-10-CM | POA: Diagnosis not present

## 2020-04-05 DIAGNOSIS — Z79899 Other long term (current) drug therapy: Secondary | ICD-10-CM | POA: Diagnosis not present

## 2020-04-05 DIAGNOSIS — K529 Noninfective gastroenteritis and colitis, unspecified: Secondary | ICD-10-CM

## 2020-04-05 DIAGNOSIS — Z794 Long term (current) use of insulin: Secondary | ICD-10-CM | POA: Insufficient documentation

## 2020-04-05 DIAGNOSIS — I10 Essential (primary) hypertension: Secondary | ICD-10-CM

## 2020-04-05 DIAGNOSIS — R112 Nausea with vomiting, unspecified: Secondary | ICD-10-CM | POA: Diagnosis not present

## 2020-04-05 DIAGNOSIS — R1111 Vomiting without nausea: Secondary | ICD-10-CM | POA: Diagnosis not present

## 2020-04-05 DIAGNOSIS — E039 Hypothyroidism, unspecified: Secondary | ICD-10-CM

## 2020-04-05 LAB — COMPREHENSIVE METABOLIC PANEL
ALT: 24 U/L (ref 0–44)
AST: 27 U/L (ref 15–41)
Albumin: 3.8 g/dL (ref 3.5–5.0)
Alkaline Phosphatase: 76 U/L (ref 38–126)
Anion gap: 9 (ref 5–15)
BUN: 20 mg/dL (ref 8–23)
CO2: 22 mmol/L (ref 22–32)
Calcium: 8.6 mg/dL — ABNORMAL LOW (ref 8.9–10.3)
Chloride: 105 mmol/L (ref 98–111)
Creatinine, Ser: 1.34 mg/dL — ABNORMAL HIGH (ref 0.44–1.00)
GFR, Estimated: 42 mL/min — ABNORMAL LOW (ref >60)
Glucose, Bld: 185 mg/dL — ABNORMAL HIGH (ref 70–99)
Potassium: 3.8 mmol/L (ref 3.5–5.1)
Sodium: 136 mmol/L (ref 135–145)
Total Bilirubin: 0.9 mg/dL (ref 0.3–1.2)
Total Protein: 7.6 g/dL (ref 6.5–8.1)

## 2020-04-05 LAB — CBC WITH DIFFERENTIAL/PLATELET
Abs Immature Granulocytes: 0.02 10*3/uL (ref 0.00–0.07)
Basophils Absolute: 0 10*3/uL (ref 0.0–0.1)
Basophils Relative: 0 %
Eosinophils Absolute: 0 10*3/uL (ref 0.0–0.5)
Eosinophils Relative: 0 %
HCT: 39.2 % (ref 36.0–46.0)
Hemoglobin: 12.4 g/dL (ref 12.0–15.0)
Immature Granulocytes: 0 %
Lymphocytes Relative: 8 %
Lymphs Abs: 0.5 10*3/uL — ABNORMAL LOW (ref 0.7–4.0)
MCH: 27.4 pg (ref 26.0–34.0)
MCHC: 31.6 g/dL (ref 30.0–36.0)
MCV: 86.5 fL (ref 80.0–100.0)
Monocytes Absolute: 0.4 10*3/uL (ref 0.1–1.0)
Monocytes Relative: 6 %
Neutro Abs: 5.1 10*3/uL (ref 1.7–7.7)
Neutrophils Relative %: 86 %
Platelets: 175 10*3/uL (ref 150–400)
RBC: 4.53 MIL/uL (ref 3.87–5.11)
RDW: 14.6 % (ref 11.5–15.5)
WBC: 6 10*3/uL (ref 4.0–10.5)
nRBC: 0 % (ref 0.0–0.2)

## 2020-04-05 LAB — TROPONIN I (HIGH SENSITIVITY)
Troponin I (High Sensitivity): 3 ng/L (ref <18)
Troponin I (High Sensitivity): 3 ng/L (ref <18)

## 2020-04-05 LAB — RESPIRATORY PANEL BY RT PCR (FLU A&B, COVID)
Influenza A by PCR: NEGATIVE
Influenza B by PCR: NEGATIVE
SARS Coronavirus 2 by RT PCR: NEGATIVE

## 2020-04-05 LAB — CK: Total CK: 102 U/L (ref 38–234)

## 2020-04-05 LAB — LIPASE, BLOOD: Lipase: 22 U/L (ref 11–51)

## 2020-04-05 MED ORDER — SODIUM CHLORIDE 0.9 % IV SOLN: INTRAVENOUS | Status: DC

## 2020-04-05 MED ORDER — ACETAMINOPHEN 325 MG PO TABS: 650.0000 mg | ORAL_TABLET | Freq: Four times a day (QID) | ORAL | Status: DC | PRN

## 2020-04-05 MED ORDER — ACETAMINOPHEN 650 MG RE SUPP: 650.0000 mg | Freq: Four times a day (QID) | RECTAL | Status: DC | PRN

## 2020-04-05 MED ORDER — ONDANSETRON HCL 4 MG/2ML IJ SOLN
4.0000 mg | Freq: Once | INTRAMUSCULAR | Status: AC
  Administered 2020-04-05: 4 mg via INTRAVENOUS
  Filled 2020-04-05: qty 2

## 2020-04-05 MED ORDER — INSULIN ASPART 100 UNIT/ML ~~LOC~~ SOLN
0.0000 [IU] | SUBCUTANEOUS | Status: DC
  Administered 2020-04-06: 3 [IU] via SUBCUTANEOUS
  Filled 2020-04-05: qty 1

## 2020-04-05 MED ORDER — ENOXAPARIN SODIUM 40 MG/0.4ML ~~LOC~~ SOLN
40.0000 mg | SUBCUTANEOUS | Status: DC
  Administered 2020-04-06: 40 mg via SUBCUTANEOUS
  Filled 2020-04-05: qty 0.4

## 2020-04-05 MED ORDER — SODIUM CHLORIDE 0.9 % IV BOLUS
500.0000 mL | Freq: Once | INTRAVENOUS | Status: AC
  Administered 2020-04-05: 500 mL via INTRAVENOUS

## 2020-04-05 MED ORDER — ONDANSETRON HCL 4 MG/2ML IJ SOLN: 4.0000 mg | Freq: Four times a day (QID) | INTRAMUSCULAR | Status: DC | PRN

## 2020-04-05 MED ORDER — ONDANSETRON HCL 4 MG PO TABS: 4.0000 mg | ORAL_TABLET | Freq: Four times a day (QID) | ORAL | Status: DC | PRN

## 2020-04-05 NOTE — ED Provider Notes (Signed)
[facility] Emergency Department Provider Note   ____________________________________________   First MD Initiated Contact with Patient 04/05/20 2003     (approximate)  I have reviewed the triage vital signs and the nursing notes.   HISTORY  Chief Complaint Loss of Consciousness    HPI SAMAMTHA TIEGS is a 69 y.o. female history of diabetes asthma hypertension hyperlipidemia  Patient reports that she went out to you with her sister, she had chili that she reports distant quite taste right, and then about 30 minutes after he got home and started feeling very nauseated began having vomiting very acidic nonbloody content, and having shortly thereafter loose very watery uncontrollable stool.  This went on for a brief period of time she went to the bathroom and while getting herself onto the toilet she reports she passed out briefly.  She called her family as she felt too fatigued to get off the floor, they called EMS.  Reports she feels nauseated, denies abdominal pain or back pain.  No chest pain or trouble breathing.  Ports she feels like she got "food poisoning".  Denies recent illness fevers or chills except she did feel yesterday just a little bit fatigued, but did not think anything of it she had a slightly runny nose but reports that is normal as well on a daily basis   Past Medical History:  Diagnosis Date  . Asthma   . Chest pain   . DM2 (diabetes mellitus, type 2) ([place]) 1987  . Dyspnea    Cath normal coronaries. normal EF. RA 11. PA 37/17 (26) LVEDP 36  . GERD (gastroesophageal reflux disease)   . HTN (hypertension)   . Hyperlipidemia   . Obesity   . Osteoarthritis    knees  . Renal disease   . Thyroid disease    having thyroid removed 04/14/15  . Vertigo    none recently    Patient Active Problem List   Diagnosis Date Noted  . HTN (hypertension) 04/05/2020  . Syncope and collapse 04/05/2020  . Acute gastroenteritis 04/05/2020  .  Obesity 10/03/2019  . Hyperlipidemia associated with type 2 diabetes mellitus ([place]) 06/29/2017  . Benign neoplasm of cecum   . Benign neoplasm of ascending colon   . Benign neoplasm of descending colon   . Allergic rhinitis 09/22/2016  . OA (osteoarthritis) of knee 06/25/2016  . Type 2 diabetes mellitus with diabetic neuropathy, with long-term current use of insulin ([place]) 10/03/2015  . S/P total thyroidectomy 04/14/2015  . Chronic kidney disease, stage 3 ([place]) 12/17/2014  . Hypertension associated with chronic kidney disease due to type 2 diabetes mellitus ([place]) 12/17/2014  . Diabetes mellitus with chronic kidney disease ([place]) 11/28/2014    Past Surgical History:  Procedure Laterality Date  . CARDIAC CATHETERIZATION    . CATARACT EXTRACTION W/PHACO Right 03/31/2015   Procedure: CATARACT EXTRACTION PHACO AND INTRAOCULAR LENS PLACEMENT (IOC);  Surgeon: Ronnell Freshwater, MD;  Location: [facility];  Service: Ophthalmology;  Laterality: Right;  DIABETIC - insulin  . COLONOSCOPY WITH PROPOFOL N/A 11/12/2016   Procedure: COLONOSCOPY WITH PROPOFOL;  Surgeon: [name], MD;  Location: [facility];  Service: Endoscopy;  Laterality: N/A;  Diabetic - insulin  . EYE SURGERY    . HERNIA REPAIR     umbilical  . POLYPECTOMY  11/12/2016   Procedure: POLYPECTOMY INTESTINAL;  Surgeon: [name], MD;  Location: [facility];  Service: Endoscopy;;  . THYROIDECTOMY N/A 04/14/2015   Procedure: THYROIDECTOMY;  Surgeon: Margaretha Sheffield, MD;  Location: [facility];  Service: ENT;  Laterality: N/A;    Prior to Admission medications   Medication Sig Start Date End Date Taking? Authorizing Provider  albuterol (VENTOLIN HFA) 108 (90 Base) MCG/ACT inhaler Inhale 2 puffs into the lungs every 6 (six) hours as needed for wheezing or shortness of breath. 01/02/20   Cannady, Henrine Screws T, NP  amLODipine (NORVASC) 2.5 MG tablet Take 2.5 mg by mouth daily. 12/06/18   [provider]    aspirin EC 81 MG tablet Take 81 mg by mouth daily.    [provider]  atorvastatin (LIPITOR) 40 MG tablet Take 1 tablet (40 mg total) by mouth at bedtime. 03/21/19   Cannady, Henrine Screws T, NP  budesonide-formoterol (SYMBICORT) 160-4.5 MCG/ACT inhaler Inhale 2 puffs into the lungs 2 (two) times daily. 01/02/20   Cannady, Henrine Screws T, NP  calcium-vitamin D (OSCAL WITH D) 500-200 MG-UNIT tablet Take 1 tablet by mouth.    [provider]  dexlansoprazole (DEXILANT) 60 MG capsule Take 60 mg by mouth daily.    [provider]  fluticasone (FLONASE) 50 MCG/ACT nasal spray USE 2 SPRAY(S) IN EACH NOSTRIL ONCE DAILY 01/11/20   Cannady, Jolene T, NP  insulin lispro (HUMALOG) 100 UNIT/ML KwikPen INJECT 20 UNITS SUBCTUANEOUSLY BEFORE BREAKFAST AND LUNCH, AND 30 UNITS BEFORE EVENING MEAL. 03/02/20   Cannady, Henrine Screws T, NP  LANTUS SOLOSTAR 100 UNIT/ML Solostar Pen INJECT 40 UNITS SUBCUTANEOUSLY TWICE DAILY 03/02/20   [name] T, NP  levothyroxine (SYNTHROID) 88 MCG tablet Take 88 mcg by mouth every morning. 08/13/19   [provider]  lisinopril (ZESTRIL) 20 MG tablet Take 1 tablet by mouth once daily 02/08/20   Cannady, Henrine Screws T, NP  meclizine (ANTIVERT) 12.5 MG tablet Take 1 tablet (12.5 mg total) by mouth 3 (three) times daily as needed for dizziness. 10/03/19   Cannady, Henrine Screws T, NP  metoprolol tartrate (LOPRESSOR) 25 MG tablet Take 25 mg by mouth 2 (two) times daily. 07/09/16   [provider]  montelukast (SINGULAIR) 10 MG tablet Take 1 tablet (10 mg total) by mouth at bedtime. 10/03/19   Cannady, Henrine Screws T, NP  ONE TOUCH ULTRA TEST test strip 1 each by Other route 2 (two) times daily.  03/15/15   [provider]  Jonetta Speak LANCETS 26R MISC 2 (two) times daily.  03/15/15   [provider]  TRULICITY 1.5 SW/5.4OE SOPN INJECT 1.5MG  INTO THE SKIN ONCE A WEEK 02/08/20   [name] T, NP    Allergies Celebrex [celecoxib], Sulfa antibiotics,  Sulfisoxazole, and Sulfur  Family History  Problem Relation Age of Onset  . Diabetes Mother   . Diabetes Father   . Diabetes Sister   . Diabetes Brother   . Diabetes Sister   . Diabetes Sister   . Diabetes Maternal Grandmother   . Diabetes Maternal Grandfather   . Diabetes Paternal Grandmother   . Diabetes Paternal Grandfather   . Heart disease Neg Hx   . Coronary artery disease Neg Hx   . Breast cancer Neg Hx     Social History Social History   Tobacco Use  . Smoking status: Never Smoker  . Smokeless tobacco: Never Used  Vaping Use  . Vaping Use: Never used  Substance Use Topics  . Alcohol use: No  . Drug use: No    Review of Systems Constitutional: No fever/chills felt slightly fatigued yesterday but nothing too unusual Eyes: No visual changes. ENT: No sore throat.  Clear runny nose chronic for years Cardiovascular: Denies chest pain. Respiratory: Denies shortness of breath. Gastrointestinal: No abdominal pain.  Nausea and vomiting with loose diarrhea nonbloody. Genitourinary: Negative for dysuria. Musculoskeletal: Negative for back pain. Skin: Negative for rash. Neurological: Negative for headaches, areas of focal weakness or numbness.    ____________________________________________   PHYSICAL EXAM:  VITAL SIGNS: ED Triage Vitals  Enc Vitals Group     BP 04/05/20 2004 (!) 136/54     Pulse Rate 04/05/20 2004 91     Resp 04/05/20 2004 16     Temp 04/05/20 2004 99.3 F (37.4 C)     Temp Source 04/05/20 2004 Oral     SpO2 04/05/20 2004 99 %     Weight --      Height --      Head Circumference --      Peak Flow --      Pain Score 04/05/20 2015 0     Pain Loc --      Pain Edu? --      Excl. in [place]? --     Constitutional: Alert and oriented.  Fatigued in appearance but in no distress. Eyes: Conjunctivae are normal. Head: Atraumatic. Nose: No congestion/rhinnorhea. Mouth/Throat: Mucous membranes are moist.  No neck pain or tenderness. Neck: No  stridor.  Cardiovascular: Normal rate, regular rhythm. Grossly normal heart sounds.  Good peripheral circulation. Respiratory: Normal respiratory effort.  No retractions. Lungs CTAB. Gastrointestinal: Soft and nontender. No distention.  No rebound guarding or distention in any quadrant.  No tenderness to palpation in any quadrant.  Negative Murphy. Musculoskeletal: No lower extremity tenderness nor edema.  Mild tenderness palpation of the right buttock where she reports that she landed on her buttocks she believes when she fell off the toilet. Neurologic:  Normal speech and language. No gross focal neurologic deficits are appreciated.  She denies headache.  Pupils equal and reactive.  No trauma noted to the head. Skin:  Skin is warm, dry and intact. No rash noted. Psychiatric: Mood and affect are normal. Speech and behavior are normal.  ____________________________________________   LABS (all labs ordered are listed, but only abnormal results are displayed)  Labs Reviewed  COMPREHENSIVE METABOLIC PANEL - Abnormal; Notable for the following components:      Result Value   Glucose, Bld 185 (*)    Creatinine, Ser 1.34 (*)    Calcium 8.6 (*)    GFR, Estimated 42 (*)    All other components within normal limits  CBC WITH DIFFERENTIAL/PLATELET - Abnormal; Notable for the following components:   Lymphs Abs 0.5 (*)    All other components within normal limits  RESPIRATORY PANEL BY RT PCR (FLU A&B, COVID)  GASTROINTESTINAL PANEL BY PCR, STOOL (REPLACES STOOL CULTURE)  C DIFFICILE QUICK SCREEN W PCR REFLEX  LIPASE, BLOOD  CK  URINALYSIS, COMPLETE (UACMP) WITH MICROSCOPIC  TROPONIN I (HIGH SENSITIVITY)  TROPONIN I (HIGH SENSITIVITY)   ____________________________________________  EKG  Reviewed inter by me at 2010 Heart rate 99 QRS 99 QTc 450 Slight baseline wander, no evidence of acute ischemia or ectopy denoted.  There is mild T wave the depression in V2, flattening V3, and the slight  slurred appearance potentially indicative of electrolyte abnormality or ischemia -compared with previous EKG appears similar in appearance ____________________________________________  RADIOLOGY  DG Abd 2 Views  Result Date: 04/05/2020 CLINICAL DATA:  Nausea and vomiting, diarrhea EXAM: ABDOMEN - 2 VIEW COMPARISON:  01/16/2018 FINDINGS: Supine and upright frontal views of  the abdomen and pelvis are obtained. Bowel gas pattern is unremarkable without obstruction or ileus. No masses or abnormal calcifications. No free gas in the greater peritoneal sac. Lung bases are clear. Severe spondylosis and facet hypertrophy at the lumbosacral junction. IMPRESSION: 1. Unremarkable bowel gas pattern. Electronically Signed   By: Randa Ngo M.D.   On: 04/05/2020 21:29    X-ray reviewed negative for acute ____________________________________________   PROCEDURES  Procedure(s) performed: None  Procedures  Critical Care performed: No  ____________________________________________   INITIAL IMPRESSION / ASSESSMENT AND PLAN / ED COURSE  Pertinent labs & imaging results that were available during my care of the patient were reviewed by me and considered in my medical decision making (see chart for details).   Differential diagnosis includes but is not limited to, abdominal perforation, aortic dissection, cholecystitis, appendicitis, diverticulitis, colitis, esophagitis/gastritis, kidney stone, pyelonephritis, urinary tract infection, aortic aneurysm. All are considered in decision and treatment plan. Based upon the patient's presentation and risk factors, will hydrate, provide abdominal x-ray to evaluate and rule out obstruction.  Patient's abdominal exam very reassuring.  She denies any abdominal pain has no pain on exam has no associated back pain.  No evidence acute abdomen.  Her labs including CBC is normal.  Minimally elevated creatinine appears near baseline.  Based on her reassuring examination  and improvement in symptoms I doubt evidence of acute intra-abdominal pathology or acute vascular pathology such as rupture or aneurysm.   ----------------------------------------- 11:55 PM on 04/05/2020 -----------------------------------------   Patient was feeling improved, but attempted p.o. challenge reports she started feeling nauseated feels that she is going to vomit and fatigued again.  Given her syncopal episode, discussed with the patient we will admit her for observation and further management with the hospitalist service.  She continues to deny abdominal pain.  She is fully awake and alert her vital signs are normal.  Discussed case with Dr. Damita Dunnings, will admit for further management and observation for syncope.  Suspect this is likely vasovagal in nature or due to orthostatic hypotension in the setting of an episode of nausea vomiting diarrhea not associated with abdominal pain and a very reassuring abdominal exam      ____________________________________________   FINAL CLINICAL IMPRESSION(S) / ED DIAGNOSES  Final diagnoses:  Syncope and collapse  Nausea vomiting and diarrhea        Note:  This document was prepared using Dragon voice recognition software and may include unintentional dictation errors       Delman Kitten, MD 04/06/20 0007

## 2020-04-05 NOTE — ED Notes (Signed)
Provider re-eval, PO challenge started

## 2020-04-05 NOTE — H&P (Signed)
History and Physical    Mary Parks:[id number] DOB: [birth year] DOA: 04/05/2020  PCP: Venita Lick, NP   Patient coming from: Home  I have personally briefly reviewed patient's old medical records in [place]  Chief Complaint: Vomiting and diarrhea, passed out  HPI: Mary Parks is a 69 y.o. female with medical history significant for DM, HTN, CKD 3a, hypothyroidism, COPD who presented to the emergency room by EMS following a syncopal episode in which she apparently passed out while on the commode waking up on the floor.  Patient ate at a fast food restaurant and after arriving home started vomiting and having diarrhea.  She said just that she made it to the commode to have another bowel movement she apparently passed out and woke up on the floor after an unknown amount of time.  States it appeared she did not fall but might have slipped off the commode.  She states she was too weak to get up and had to crawl to the kitchen to call her sister who called EMS.  Said she had more episodes of vomiting while dragging herself on the floor to the kitchen.  All episodes of emesis were of ingested foodstuff and nonbloody and nonbilious her sister who also ate at the restaurant had no similar symptoms.  She denies fever chills abdominal pain.  Denies chest pain or shortness of breath. ED Course: On arrival, vitals were within normal limits.  Blood work normal at baseline.  Abdominal x-ray with normal bowel gas pattern  EKG as reviewed by me : Sinus rhythm at 91 Patient was given an IV fluid bolus but continued to feel generalized malaise.  Hospitalist consulted for admission.  Review of Systems: As per HPI otherwise all other systems on review of systems negative.    Past Medical History:  Diagnosis Date  . Asthma   . Chest pain   . DM2 (diabetes mellitus, type 2) ([place]) 1987  . Dyspnea    Cath normal coronaries. normal EF. RA 11. PA 37/17 (26) LVEDP 36  . GERD  (gastroesophageal reflux disease)   . HTN (hypertension)   . Hyperlipidemia   . Obesity   . Osteoarthritis    knees  . Renal disease   . Thyroid disease    having thyroid removed 04/14/15  . Vertigo    none recently    Past Surgical History:  Procedure Laterality Date  . CARDIAC CATHETERIZATION    . CATARACT EXTRACTION W/PHACO Right 03/31/2015   Procedure: CATARACT EXTRACTION PHACO AND INTRAOCULAR LENS PLACEMENT (IOC);  Surgeon: Ronnell Freshwater, MD;  Location: [facility];  Service: Ophthalmology;  Laterality: Right;  DIABETIC - insulin  . COLONOSCOPY WITH PROPOFOL N/A 11/12/2016   Procedure: COLONOSCOPY WITH PROPOFOL;  Surgeon: [name], MD;  Location: [facility];  Service: Endoscopy;  Laterality: N/A;  Diabetic - insulin  . EYE SURGERY    . HERNIA REPAIR     umbilical  . POLYPECTOMY  11/12/2016   Procedure: POLYPECTOMY INTESTINAL;  Surgeon: [name], MD;  Location: [facility];  Service: Endoscopy;;  . THYROIDECTOMY N/A 04/14/2015   Procedure: THYROIDECTOMY;  Surgeon: Margaretha Sheffield, MD;  Location: [facility];  Service: ENT;  Laterality: N/A;     reports that she has never smoked. She has never used smokeless tobacco. She reports that she does not drink alcohol and does not use drugs.  Allergies  Allergen Reactions  . Celebrex [Celecoxib]     hallucinations  .  Sulfa Antibiotics   . Sulfisoxazole Rash  . Sulfur Itching    Family History  Problem Relation Age of Onset  . Diabetes Mother   . Diabetes Father   . Diabetes Sister   . Diabetes Brother   . Diabetes Sister   . Diabetes Sister   . Diabetes Maternal Grandmother   . Diabetes Maternal Grandfather   . Diabetes Paternal Grandmother   . Diabetes Paternal Grandfather   . Heart disease Neg Hx   . Coronary artery disease Neg Hx   . Breast cancer Neg Hx       Prior to Admission medications   Medication Sig Start Date End Date Taking? Authorizing Provider  albuterol  (VENTOLIN HFA) 108 (90 Base) MCG/ACT inhaler Inhale 2 puffs into the lungs every 6 (six) hours as needed for wheezing or shortness of breath. 01/02/20   Cannady, Henrine Screws T, NP  amLODipine (NORVASC) 2.5 MG tablet Take 2.5 mg by mouth daily. 12/06/18   [provider]  aspirin EC 81 MG tablet Take 81 mg by mouth daily.    [provider]  atorvastatin (LIPITOR) 40 MG tablet Take 1 tablet (40 mg total) by mouth at bedtime. 03/21/19   Cannady, Henrine Screws T, NP  budesonide-formoterol (SYMBICORT) 160-4.5 MCG/ACT inhaler Inhale 2 puffs into the lungs 2 (two) times daily. 01/02/20   Cannady, Henrine Screws T, NP  calcium-vitamin D (OSCAL WITH D) 500-200 MG-UNIT tablet Take 1 tablet by mouth.    [provider]  dexlansoprazole (DEXILANT) 60 MG capsule Take 60 mg by mouth daily.    [provider]  fluticasone (FLONASE) 50 MCG/ACT nasal spray USE 2 SPRAY(S) IN EACH NOSTRIL ONCE DAILY 01/11/20   Cannady, Jolene T, NP  insulin lispro (HUMALOG) 100 UNIT/ML KwikPen INJECT 20 UNITS SUBCTUANEOUSLY BEFORE BREAKFAST AND LUNCH, AND 30 UNITS BEFORE EVENING MEAL. 03/02/20   Cannady, Henrine Screws T, NP  LANTUS SOLOSTAR 100 UNIT/ML Solostar Pen INJECT 40 UNITS SUBCUTANEOUSLY TWICE DAILY 03/02/20   [name] T, NP  levothyroxine (SYNTHROID) 88 MCG tablet Take 88 mcg by mouth every morning. 08/13/19   [provider]  lisinopril (ZESTRIL) 20 MG tablet Take 1 tablet by mouth once daily 02/08/20   Cannady, Henrine Screws T, NP  meclizine (ANTIVERT) 12.5 MG tablet Take 1 tablet (12.5 mg total) by mouth 3 (three) times daily as needed for dizziness. 10/03/19   Cannady, Henrine Screws T, NP  metoprolol tartrate (LOPRESSOR) 25 MG tablet Take 25 mg by mouth 2 (two) times daily. 07/09/16   [provider]  montelukast (SINGULAIR) 10 MG tablet Take 1 tablet (10 mg total) by mouth at bedtime. 10/03/19   Cannady, Henrine Screws T, NP  ONE TOUCH ULTRA TEST test strip 1 each by Other route 2 (two) times daily.  03/15/15    [provider]  Jonetta Speak LANCETS 33A MISC 2 (two) times daily.  03/15/15   [provider]  TRULICITY 1.5 SN/0.5LZ SOPN INJECT 1.5MG  INTO THE SKIN ONCE A WEEK 02/08/20   [name] T, NP    Physical Exam: Vitals:   04/05/20 2004 04/05/20 2100 04/05/20 2200  BP: (!) 136/54 (!) 125/50 136/63  Pulse: 91 86 88  Resp: 16 20 19   Temp: 99.3 F (37.4 C)    TempSrc: Oral    SpO2: 99% 98% 97%     Vitals:   04/05/20 2004 04/05/20 2100 04/05/20 2200  BP: (!) 136/54 (!) 125/50 136/63  Pulse: 91 86 88  Resp: 16 20 19   Temp: 99.3 F (  37.4 C)    TempSrc: Oral    SpO2: 99% 98% 97%      Constitutional:  Ill-appearing.  Oriented x 3 . Not in any apparent distress HEENT:      Head: Normocephalic and atraumatic.         Eyes: PERLA, EOMI, Conjunctivae are normal. Sclera is non-icteric.       Mouth/Throat: Mucous membranes are moist.       Neck: Supple with no signs of meningismus. Cardiovascular: Regular rate and rhythm. No murmurs, gallops, or rubs. 2+ symmetrical distal pulses are present . No JVD. No LE edema Respiratory: Respiratory effort normal .Lungs sounds clear bilaterally. No wheezes, crackles, or rhonchi.  Gastrointestinal: Soft, non tender, and non distended with positive bowel sounds. No rebound or guarding. Genitourinary: No CVA tenderness. Musculoskeletal: Nontender with normal range of motion in all extremities. No cyanosis, or erythema of extremities. Neurologic:  Face is symmetric. Moving all extremities. No gross focal neurologic deficits . Skin: Skin is warm, dry.  No rash or ulcers Psychiatric: Mood and affect are normal    Labs on Admission: I have personally reviewed following labs and imaging studies  CBC: Recent Labs  Lab 04/05/20 2200  WBC 6.0  NEUTROABS 5.1  HGB 12.4  HCT 39.2  MCV 86.5  PLT 595   Basic Metabolic Panel: Recent Labs  Lab 04/05/20 2012  NA 136  K 3.8  CL 105  CO2 22  GLUCOSE 185*  BUN 20    CREATININE 1.34*  CALCIUM 8.6*   GFR: CrCl cannot be calculated (Unknown ideal weight.). Liver Function Tests: Recent Labs  Lab 04/05/20 2012  AST 27  ALT 24  ALKPHOS 76  BILITOT 0.9  PROT 7.6  ALBUMIN 3.8   Recent Labs  Lab 04/05/20 2012  LIPASE 22   No results for input(s): AMMONIA in the last 168 hours. Coagulation Profile: No results for input(s): INR, PROTIME in the last 168 hours. Cardiac Enzymes: Recent Labs  Lab 04/05/20 2012  CKTOTAL 102   BNP (last 3 results) No results for input(s): PROBNP in the last 8760 hours. HbA1C: No results for input(s): HGBA1C in the last 72 hours. CBG: No results for input(s): GLUCAP in the last 168 hours. Lipid Profile: No results for input(s): CHOL, HDL, LDLCALC, TRIG, CHOLHDL, LDLDIRECT in the last 72 hours. Thyroid Function Tests: No results for input(s): TSH, T4TOTAL, FREET4, T3FREE, THYROIDAB in the last 72 hours. Anemia Panel: No results for input(s): VITAMINB12, FOLATE, FERRITIN, TIBC, IRON, RETICCTPCT in the last 72 hours. Urine analysis:    Component Value Date/Time   COLORURINE YELLOW 06/05/2019 0928   APPEARANCEUR HAZY (A) 06/05/2019 0928   APPEARANCEUR Clear 03/21/2019 1020   LABSPEC 1.025 06/05/2019 0928   LABSPEC >=1.030 03/22/2013 1451   PHURINE 5.5 06/05/2019 0928   GLUCOSEU NEGATIVE 06/05/2019 0928   GLUCOSEU 100 mg/dL 03/22/2013 1451   HGBUR NEGATIVE 06/05/2019 [place] 06/05/2019 0928   BILIRUBINUR Negative 03/21/2019 [place] 03/22/2013 1451   KETONESUR NEGATIVE 06/05/2019 0928   PROTEINUR 100 (A) 06/05/2019 0928   NITRITE NEGATIVE 06/05/2019 0928   LEUKOCYTESUR NEGATIVE 06/05/2019 0928   LEUKOCYTESUR NEGATIVE 03/22/2013 1451    Radiological Exams on Admission: DG Abd 2 Views  Result Date: 04/05/2020 CLINICAL DATA:  Nausea and vomiting, diarrhea EXAM: ABDOMEN - 2 VIEW COMPARISON:  01/16/2018 FINDINGS: Supine and upright frontal views of the abdomen and  pelvis are obtained. Bowel gas pattern is unremarkable without obstruction or  ileus. No masses or abnormal calcifications. No free gas in the greater peritoneal sac. Lung bases are clear. Severe spondylosis and facet hypertrophy at the lumbosacral junction. IMPRESSION: 1. Unremarkable bowel gas pattern. Electronically Signed   By: Randa Ngo M.D.   On: 04/05/2020 21:29     Assessment/Plan 69 year old female with history of DM, HTN, CKD 3a, hypothyroidism, COPD presenting with syncopal episode following several episodes of vomiting and diarrhea.    Syncope and collapse -Likely vasovagal/orthostatic hypotension related to acute gastrointestinal illness -Continue IV hydration -Check orthostatics    Acute gastroenteritis -Possibly food related though contacts were not affected -Follow stool studies ordered from the emergency room -Bowel rest for now and then clear sips and advance as tolerated -Contact precautions    COPD (chronic obstructive pulmonary disease) ([place]) -Chronic and stable -DuoNebs as needed and continue home inhalers    Chronic kidney disease, stage 3a ([place]) -Appears stable.  Renal function at baseline -Continue IV hydration    Type 2 diabetes mellitus with diabetic neuropathy, with long-term current use of insulin ([facility]) -Sliding scale insulin for now    HTN (hypertension) -Continue home antihypertensives    Hypothyroidism -Continue levothyroxine    DVT prophylaxis: Lovenox  Code Status: full code  Family Communication:  none  Disposition Plan: Back to previous home environment Consults called: none  Status: Observation    Athena Masse MD [facility]     04/05/2020, 11:58 PM  ]

## 2020-04-05 NOTE — ED Triage Notes (Addendum)
Pt to ED from home via EMS for NVD. Vomiting x4, multiple episodes of diarrhea. Symptoms started approximately 30 minutes p eating chili from [facility]. Syncopal episode en route to restroom , witness by family. Negative head strike. Denies blood in stool or emesis. Denies pain at this time. AO x4, talking in full sentences with regular and unlabored breathing.

## 2020-04-05 NOTE — ED Notes (Signed)
Additional labs obtained. Pt daughter at bedside. Provided with warm blanket. AO x4. XR at bedside

## 2020-04-05 NOTE — ED Notes (Signed)
Pt nauseous during PO challenge. Medicated per [facility]

## 2020-04-06 ENCOUNTER — Encounter: Payer: Self-pay | Admitting: Internal Medicine

## 2020-04-06 DIAGNOSIS — K529 Noninfective gastroenteritis and colitis, unspecified: Secondary | ICD-10-CM

## 2020-04-06 DIAGNOSIS — R55 Syncope and collapse: Secondary | ICD-10-CM

## 2020-04-06 DIAGNOSIS — E039 Hypothyroidism, unspecified: Secondary | ICD-10-CM

## 2020-04-06 LAB — CBC
HCT: 33.8 % — ABNORMAL LOW (ref 36.0–46.0)
Hemoglobin: 11 g/dL — ABNORMAL LOW (ref 12.0–15.0)
MCH: 27.1 pg (ref 26.0–34.0)
MCHC: 32.5 g/dL (ref 30.0–36.0)
MCV: 83.3 fL (ref 80.0–100.0)
Platelets: 156 10*3/uL (ref 150–400)
RBC: 4.06 MIL/uL (ref 3.87–5.11)
RDW: 14.6 % (ref 11.5–15.5)
WBC: 3.5 10*3/uL — ABNORMAL LOW (ref 4.0–10.5)
nRBC: 0 % (ref 0.0–0.2)

## 2020-04-06 LAB — GLUCOSE, CAPILLARY
Glucose-Capillary: 112 mg/dL — ABNORMAL HIGH (ref 70–99)
Glucose-Capillary: 67 mg/dL — ABNORMAL LOW (ref 70–99)
Glucose-Capillary: 123 mg/dL — ABNORMAL HIGH (ref 70–99)
Glucose-Capillary: 79 mg/dL (ref 70–99)

## 2020-04-06 LAB — BASIC METABOLIC PANEL
Anion gap: 6 (ref 5–15)
BUN: 15 mg/dL (ref 8–23)
CO2: 22 mmol/L (ref 22–32)
Calcium: 8 mg/dL — ABNORMAL LOW (ref 8.9–10.3)
Chloride: 108 mmol/L (ref 98–111)
Creatinine, Ser: 1.14 mg/dL — ABNORMAL HIGH (ref 0.44–1.00)
GFR, Estimated: 51 mL/min — ABNORMAL LOW (ref >60)
Glucose, Bld: 115 mg/dL — ABNORMAL HIGH (ref 70–99)
Potassium: 3.7 mmol/L (ref 3.5–5.1)
Sodium: 136 mmol/L (ref 135–145)

## 2020-04-06 LAB — CBG MONITORING, ED: Glucose-Capillary: 195 mg/dL — ABNORMAL HIGH (ref 70–99)

## 2020-04-06 LAB — HEMOGLOBIN A1C
Hgb A1c MFr Bld: 8.7 % — ABNORMAL HIGH (ref 4.8–5.6)
Mean Plasma Glucose: 202.99 mg/dL

## 2020-04-06 MED ORDER — PANTOPRAZOLE SODIUM 40 MG PO TBEC
40.0000 mg | DELAYED_RELEASE_TABLET | Freq: Every day | ORAL | Status: DC
  Administered 2020-04-06: 40 mg via ORAL
  Filled 2020-04-06: qty 1

## 2020-04-06 MED ORDER — INSULIN GLARGINE 100 UNIT/ML ~~LOC~~ SOLN
40.0000 [IU] | Freq: Two times a day (BID) | SUBCUTANEOUS | Status: DC
  Filled 2020-04-06 (×2): qty 0.4

## 2020-04-06 MED ORDER — CALCIUM CARBONATE-VITAMIN D 500-200 MG-UNIT PO TABS: 1.0000 | ORAL_TABLET | Freq: Every day | ORAL | Status: DC

## 2020-04-06 MED ORDER — DEXTROSE 50 % IV SOLN: 25.0000 mL | Freq: Once | INTRAVENOUS | Status: AC

## 2020-04-06 MED ORDER — AMLODIPINE BESYLATE 5 MG PO TABS
2.5000 mg | ORAL_TABLET | Freq: Every day | ORAL | Status: DC
  Administered 2020-04-06: 2.5 mg via ORAL
  Filled 2020-04-06: qty 1

## 2020-04-06 MED ORDER — FLUTICASONE PROPIONATE 50 MCG/ACT NA SUSP
1.0000 | Freq: Every day | NASAL | Status: DC
  Filled 2020-04-06: qty 16

## 2020-04-06 MED ORDER — MECLIZINE HCL 12.5 MG PO TABS
12.5000 mg | ORAL_TABLET | Freq: Three times a day (TID) | ORAL | Status: DC | PRN
  Filled 2020-04-06: qty 1

## 2020-04-06 MED ORDER — ALBUTEROL SULFATE HFA 108 (90 BASE) MCG/ACT IN AERS
2.0000 | INHALATION_SPRAY | Freq: Four times a day (QID) | RESPIRATORY_TRACT | Status: DC | PRN
  Filled 2020-04-06: qty 6.7

## 2020-04-06 MED ORDER — ATORVASTATIN CALCIUM 20 MG PO TABS: 40.0000 mg | ORAL_TABLET | Freq: Every day | ORAL | Status: DC

## 2020-04-06 MED ORDER — METOPROLOL TARTRATE 25 MG PO TABS
25.0000 mg | ORAL_TABLET | Freq: Two times a day (BID) | ORAL | Status: DC
  Administered 2020-04-06: 25 mg via ORAL
  Filled 2020-04-06: qty 1

## 2020-04-06 MED ORDER — ASPIRIN EC 81 MG PO TBEC
81.0000 mg | DELAYED_RELEASE_TABLET | Freq: Every day | ORAL | Status: DC
  Administered 2020-04-06: 81 mg via ORAL
  Filled 2020-04-06: qty 1

## 2020-04-06 MED ORDER — MONTELUKAST SODIUM 10 MG PO TABS: 10.0000 mg | ORAL_TABLET | Freq: Every day | ORAL | Status: DC

## 2020-04-06 MED ORDER — LEVOTHYROXINE SODIUM 88 MCG PO TABS
88.0000 ug | ORAL_TABLET | Freq: Every day | ORAL | Status: DC
  Filled 2020-04-06: qty 1

## 2020-04-06 MED ORDER — DEXTROSE 50 % IV SOLN
INTRAVENOUS | Status: AC
  Administered 2020-04-06: 25 mL via INTRAVENOUS
  Filled 2020-04-06: qty 50

## 2020-04-06 MED ORDER — INSULIN LISPRO (1 UNIT DIAL) 100 UNIT/ML (KWIKPEN): PEN_INJECTOR | SUBCUTANEOUS | 3 refills | Status: DC

## 2020-04-06 NOTE — ED Notes (Signed)
Report called to [place], [place]

## 2020-04-06 NOTE — Progress Notes (Addendum)
Hypoglycemic Event  CBG: 67  Treatment: D50 25 mL (12.5 gm)  Symptoms: headache  Follow-up CBG: Time: 0848 CBG Result:123  Possible Reasons for Event: Other: NPO  Comments/MD notified:Secure chat to Dr. Henriette Combs

## 2020-04-06 NOTE — Discharge Summary (Signed)
Physician Discharge Summary  Mary Parks KNL:[id number] DOB: July 14, 1950 DOA: 04/05/2020  PCP: Venita Lick, NP  Admit date: 04/05/2020 Discharge date: 04/06/2020  Discharge disposition: Home   Recommendations for Outpatient Follow-Up:   Follow-up with PCP in 1 week   Discharge Diagnosis:   Principal Problem:   Syncope and collapse Active Problems:   COPD (chronic obstructive pulmonary disease) (HCC)   Chronic kidney disease, stage 3a (HCC)   Type 2 diabetes mellitus with diabetic neuropathy, with long-term current use of insulin (HCC)   HTN (hypertension)   Acute gastroenteritis   Hypothyroidism    Discharge Condition: Stable.  Diet recommendation:  Diet Order            Diet Carb Modified Fluid consistency: Thin; Room service appropriate? Yes  Diet effective now           Diet - low sodium heart healthy           Diet Carb Modified                   Code Status: Full Code     Hospital Course:   Mary Parks is a 69 year old woman with medical history significant for insulin-dependent diabetes, hypertension, CKD stage III, hypothyroidism, COPD.  She presented to the hospital because of vomiting, diarrhea and syncope.  She said she had eating [employer] and developed vomiting and diarrhea when she got home.  She passed out in the past and while using the commode.  She said she was unable to get up since that time to get up from the floor made her dizzy.  She was admitted to the hospital for acute gastroenteritis and syncope likely due to dehydration from gastroenteritis.  She was treated with IV fluids.  Vomiting and diarrhea have resolved and she is not any symptoms since admission.  There was no evidence of orthostatic hypotension after hydration with IV fluids.  She has been able to ambulate without any symptoms.  Her condition has improved and she is deemed stable for discharge to home.        Discharge Exam:    Vitals:    04/06/20 0739 04/06/20 1022 04/06/20 1023 04/06/20 1025  BP: 119/63 126/66 121/67 125/65  Pulse: 80 79 87 97  Resp:      Temp: 98.6 F (37 C)     TempSrc: Oral     SpO2: 100%     Weight:      Height:         GEN: NAD SKIN: No rash EYES: EOMI ENT: MMM CV: RRR PULM: CTA B ABD: soft, obese, NT, +BS CNS: AAO x 3, non focal EXT: No edema or tenderness   The results of significant diagnostics from this hospitalization (including imaging, microbiology, ancillary and laboratory) are listed below for reference.     Procedures and Diagnostic Studies:   DG Abd 2 Views  Result Date: 04/05/2020 CLINICAL DATA:  Nausea and vomiting, diarrhea EXAM: ABDOMEN - 2 VIEW COMPARISON:  01/16/2018 FINDINGS: Supine and upright frontal views of the abdomen and pelvis are obtained. Bowel gas pattern is unremarkable without obstruction or ileus. No masses or abnormal calcifications. No free gas in the greater peritoneal sac. Lung bases are clear. Severe spondylosis and facet hypertrophy at the lumbosacral junction. IMPRESSION: 1. Unremarkable bowel gas pattern. Electronically Signed   By: Randa Ngo M.D.   On: 04/05/2020 21:29     Labs:   Basic Metabolic Panel: Recent Labs  Lab 04/05/20 2012 04/06/20 0503  NA 136 136  K 3.8 3.7  CL 105 108  CO2 22 22  GLUCOSE 185* 115*  BUN 20 15  CREATININE 1.34* 1.14*  CALCIUM 8.6* 8.0*   GFR Estimated Creatinine Clearance: 47.2 mL/min (A) (by C-G formula based on SCr of 1.14 mg/dL (H)). Liver Function Tests: Recent Labs  Lab 04/05/20 2012  AST 27  ALT 24  ALKPHOS 76  BILITOT 0.9  PROT 7.6  ALBUMIN 3.8   Recent Labs  Lab 04/05/20 2012  LIPASE 22   No results for input(s): AMMONIA in the last 168 hours. Coagulation profile No results for input(s): INR, PROTIME in the last 168 hours.  CBC: Recent Labs  Lab 04/05/20 2200 04/06/20 0503  WBC 6.0 3.5*  NEUTROABS 5.1  --   HGB 12.4 11.0*  HCT 39.2 33.8*  MCV 86.5 83.3  PLT 175  156   Cardiac Enzymes: Recent Labs  Lab 04/05/20 2012  CKTOTAL 102   BNP: Invalid input(s): POCBNP CBG: Recent Labs  Lab 04/06/20 0008 04/06/20 0408 04/06/20 0823 04/06/20 0848 04/06/20 1147  GLUCAP 195* 112* 67* 123* 79   D-Dimer No results for input(s): DDIMER in the last 72 hours. Hgb A1c Recent Labs    04/06/20 0133  HGBA1C 8.7*   Lipid Profile No results for input(s): CHOL, HDL, LDLCALC, TRIG, CHOLHDL, LDLDIRECT in the last 72 hours. Thyroid function studies No results for input(s): TSH, T4TOTAL, T3FREE, THYROIDAB in the last 72 hours.  Invalid input(s): FREET3 Anemia work up No results for input(s): VITAMINB12, FOLATE, FERRITIN, TIBC, IRON, RETICCTPCT in the last 72 hours. Microbiology Recent Results (from the past 240 hour(s))  Respiratory Panel by RT PCR (Flu A&B, Covid) - Nasopharyngeal Swab     Status: None   Collection Time: 04/05/20  8:50 PM   Specimen: Nasopharyngeal Swab  Result Value Ref Range Status   SARS Coronavirus 2 by RT PCR NEGATIVE NEGATIVE Final    Comment: (NOTE) SARS-CoV-2 target nucleic acids are NOT DETECTED.  The SARS-CoV-2 RNA is generally detectable in upper respiratoy specimens during the acute phase of infection. The lowest concentration of SARS-CoV-2 viral copies this assay can detect is 131 copies/mL. A negative result does not preclude SARS-Cov-2 infection and should not be used as the sole basis for treatment or other patient management decisions. A negative result may occur with  improper specimen collection/handling, submission of specimen other than nasopharyngeal swab, presence of viral mutation(s) within the areas targeted by this assay, and inadequate number of viral copies (<131 copies/mL). A negative result must be combined with clinical observations, patient history, and epidemiological information. The expected result is Negative.  Fact Sheet for Patients:  PinkCheek.be  Fact  Sheet for Healthcare Providers:  GravelBags.it  This test is no t yet approved or cleared by the [place] FDA and  has been authorized for detection and/or diagnosis of SARS-CoV-2 by FDA under an Emergency Use Authorization (EUA). This EUA will remain  in effect (meaning this test can be used) for the duration of the COVID-19 declaration under Section 564(b)(1) of the Act, 21 U.S.C. section 360bbb-3(b)(1), unless the authorization is terminated or revoked sooner.     Influenza A by PCR NEGATIVE NEGATIVE Final   Influenza B by PCR NEGATIVE NEGATIVE Final    Comment: (NOTE) The Xpert Xpress SARS-CoV-2/FLU/RSV assay is intended as an aid in  the diagnosis of influenza from Nasopharyngeal swab specimens and  should not be used as a sole basis  for treatment. Nasal washings and  aspirates are unacceptable for Xpert Xpress SARS-CoV-2/FLU/RSV  testing.  Fact Sheet for Patients: PinkCheek.be  Fact Sheet for Healthcare Providers: GravelBags.it  This test is not yet approved or cleared by the [place] FDA and  has been authorized for detection and/or diagnosis of SARS-CoV-2 by  FDA under an Emergency Use Authorization (EUA). This EUA will remain  in effect (meaning this test can be used) for the duration of the  Covid-19 declaration under Section 564(b)(1) of the Act, 21  U.S.C. section 360bbb-3(b)(1), unless the authorization is  terminated or revoked. Performed at [facility], [address], [place], [place] [address]      Discharge Instructions:   Discharge Instructions    Diet - low sodium heart healthy   Complete by: As directed    Diet Carb Modified   Complete by: As directed    Increase activity slowly   Complete by: As directed      Allergies as of 04/06/2020      Reactions   Celebrex [celecoxib]    hallucinations   Sulfa Antibiotics    Sulfisoxazole Rash    Sulfur Itching      Medication List    TAKE these medications   albuterol 108 (90 Base) MCG/ACT inhaler Commonly known as: VENTOLIN HFA Inhale 2 puffs into the lungs every 6 (six) hours as needed for wheezing or shortness of breath.   amLODipine 2.5 MG tablet Commonly known as: NORVASC Take 2.5 mg by mouth daily.   aspirin EC 81 MG tablet Take 81 mg by mouth daily.   atorvastatin 40 MG tablet Commonly known as: LIPITOR Take 1 tablet (40 mg total) by mouth at bedtime.   budesonide-formoterol 160-4.5 MCG/ACT inhaler Commonly known as: SYMBICORT Inhale 2 puffs into the lungs 2 (two) times daily.   calcium-vitamin D 500-200 MG-UNIT tablet Commonly known as: OSCAL WITH D Take 1 tablet by mouth.   Dexilant 60 MG capsule Generic drug: dexlansoprazole Take 60 mg by mouth daily.   fluticasone 50 MCG/ACT nasal spray Commonly known as: FLONASE USE 2 SPRAY(S) IN EACH NOSTRIL ONCE DAILY   insulin lispro 100 UNIT/ML KwikPen Commonly known as: HUMALOG INJECT 8 UNITS SUBCTUANEOUSLY BEFORE BREAKFAST, 6 UNITS BEFORE LUNCH, AND 6 UNITS BEFORE EVENING MEAL. What changed: additional instructions   Lantus SoloStar 100 UNIT/ML Solostar Pen Generic drug: insulin glargine INJECT 40 UNITS SUBCUTANEOUSLY TWICE DAILY   levothyroxine 88 MCG tablet Commonly known as: SYNTHROID Take 88 mcg by mouth every morning.   lisinopril 20 MG tablet Commonly known as: ZESTRIL Take 1 tablet by mouth once daily   meclizine 12.5 MG tablet Commonly known as: ANTIVERT Take 1 tablet (12.5 mg total) by mouth 3 (three) times daily as needed for dizziness.   metoprolol tartrate 25 MG tablet Commonly known as: LOPRESSOR Take 25 mg by mouth 2 (two) times daily.   montelukast 10 MG tablet Commonly known as: SINGULAIR Take 1 tablet (10 mg total) by mouth at bedtime.   ONE TOUCH ULTRA TEST test strip Generic drug: glucose blood 1 each by Other route 2 (two) times daily.   OneTouch Delica Lancets 60A  Misc 2 (two) times daily.   Trulicity 1.5 VW/0.9WJ Sopn Generic drug: Dulaglutide INJECT 1.5MG  INTO THE SKIN ONCE A WEEK         Time coordinating discharge: 31 minutes  Signed:  Leeroy Lovings  [facility] 04/06/2020, 1:18 PM   Pager on [email or web address]. If 7PM-7AM, please contact night-coverage at  [email or web address]

## 2020-04-09 ENCOUNTER — Encounter: Payer: Self-pay | Admitting: Nurse Practitioner

## 2020-04-09 ENCOUNTER — Ambulatory Visit (INDEPENDENT_AMBULATORY_CARE_PROVIDER_SITE_OTHER): Payer: Medicare Other | Admitting: Nurse Practitioner

## 2020-04-09 ENCOUNTER — Other Ambulatory Visit: Payer: Self-pay

## 2020-04-09 VITALS — BP 115/73 | HR 81 | Temp 98.4°F | Wt 179.8 lb

## 2020-04-09 DIAGNOSIS — D649 Anemia, unspecified: Secondary | ICD-10-CM | POA: Diagnosis not present

## 2020-04-09 DIAGNOSIS — N1831 Chronic kidney disease, stage 3a: Secondary | ICD-10-CM

## 2020-04-09 DIAGNOSIS — E1122 Type 2 diabetes mellitus with diabetic chronic kidney disease: Secondary | ICD-10-CM | POA: Diagnosis not present

## 2020-04-09 DIAGNOSIS — E1169 Type 2 diabetes mellitus with other specified complication: Secondary | ICD-10-CM | POA: Diagnosis not present

## 2020-04-09 DIAGNOSIS — E0822 Diabetes mellitus due to underlying condition with diabetic chronic kidney disease: Secondary | ICD-10-CM

## 2020-04-09 DIAGNOSIS — K529 Noninfective gastroenteritis and colitis, unspecified: Secondary | ICD-10-CM

## 2020-04-09 DIAGNOSIS — D519 Vitamin B12 deficiency anemia, unspecified: Secondary | ICD-10-CM | POA: Diagnosis not present

## 2020-04-09 DIAGNOSIS — E890 Postprocedural hypothyroidism: Secondary | ICD-10-CM

## 2020-04-09 DIAGNOSIS — E785 Hyperlipidemia, unspecified: Secondary | ICD-10-CM

## 2020-04-09 DIAGNOSIS — Z794 Long term (current) use of insulin: Secondary | ICD-10-CM

## 2020-04-09 DIAGNOSIS — I129 Hypertensive chronic kidney disease with stage 1 through stage 4 chronic kidney disease, or unspecified chronic kidney disease: Secondary | ICD-10-CM

## 2020-04-09 MED ORDER — ONDANSETRON HCL 4 MG PO TABS: 4.0000 mg | ORAL_TABLET | Freq: Three times a day (TID) | ORAL | 0 refills | Status: DC | PRN

## 2020-04-09 NOTE — Assessment & Plan Note (Signed)
Chronic, ongoing.  A1C with A1C 8.7% in hospital, recommend focus on diet.  Urine ALB 150 and A:C 30-300 last visit.  Continue current medication regimen and collaboration with endocrinology and nephrology. CMP today, continue Lisinopril for kidney protection.  Return in 6 months.  May benefit from trial of [facility] in future.

## 2020-04-09 NOTE — Assessment & Plan Note (Signed)
S/P thyroidectomy years ago.  Followed by Dr. Rosario Jacks with endo.  Continue current medication regimen and adjust as needed.  Current adjustments made by endo.

## 2020-04-09 NOTE — Patient Instructions (Signed)
Food Choices to Help Relieve Diarrhea, Adult When you have diarrhea, the foods you eat and your eating habits are very important. Choosing the right foods and drinks can help:  Relieve diarrhea.  Replace lost fluids and nutrients.  Prevent dehydration. What general guidelines should I follow?  Relieving diarrhea  Choose foods with less than 2 g or .07 oz. of fiber per serving.  Limit fats to less than 8 tsp (38 g or 1.34 oz.) a day.  Avoid the following: ? Foods and beverages sweetened with high-fructose corn syrup, honey, or sugar alcohols such as xylitol, sorbitol, and mannitol. ? Foods that contain a lot of fat or sugar. ? Fried, greasy, or spicy foods. ? High-fiber grains, breads, and cereals. ? Raw fruits and vegetables.  Eat foods that are rich in probiotics. These foods include dairy products such as yogurt and fermented milk products. They help increase healthy bacteria in the stomach and intestines (gastrointestinal tract, or GI tract).  If you have lactose intolerance, avoid dairy products. These may make your diarrhea worse.  Take medicine to help stop diarrhea (antidiarrheal medicine) only as told by your health care provider. Replacing nutrients  Eat small meals or snacks every 3-4 hours.  Eat bland foods, such as white rice, toast, or baked potato, until your diarrhea starts to get better. Gradually reintroduce nutrient-rich foods as tolerated or as told by your health care provider. This includes: ? Well-cooked protein foods. ? Peeled, seeded, and soft-cooked fruits and vegetables. ? Low-fat dairy products.  Take vitamin and mineral supplements as told by your health care provider. Preventing dehydration  Start by sipping water or a special solution to prevent dehydration (oral rehydration solution, ORS). Urine that is clear or pale yellow means that you are getting enough fluid.  Try to drink at least 8-10 cups of fluid each day to help replace lost  fluids.  You may add other liquids in addition to water, such as clear juice or decaffeinated sports drinks, as tolerated or as told by your health care provider.  Avoid drinks with caffeine, such as coffee, tea, or soft drinks.  Avoid alcohol. What foods are recommended?     The items listed may not be a complete list. Talk with your health care provider about what dietary choices are best for you. Grains White rice. White, French, or pita breads (fresh or toasted), including plain rolls, buns, or bagels. White pasta. Saltine, soda, or graham crackers. Pretzels. Low-fiber cereal. Cooked cereals made with water (such as cornmeal, farina, or cream cereals). Plain muffins. Matzo. Melba toast. Zwieback. Vegetables Potatoes (without the skin). Most well-cooked and canned vegetables without skins or seeds. Tender lettuce. Fruits Apple sauce. Fruits canned in juice. Cooked apricots, cherries, grapefruit, peaches, pears, or plums. Fresh bananas and cantaloupe. Meats and other protein foods Baked or boiled chicken. Eggs. Tofu. Fish. Seafood. Smooth nut butters. Ground or well-cooked tender beef, ham, veal, lamb, pork, or poultry. Dairy Plain yogurt, kefir, and unsweetened liquid yogurt. Lactose-free milk, buttermilk, skim milk, or soy milk. Low-fat or nonfat hard cheese. Beverages Water. Low-calorie sports drinks. Fruit juices without pulp. Strained tomato and vegetable juices. Decaffeinated teas. Sugar-free beverages not sweetened with sugar alcohols. Oral rehydration solutions, if approved by your health care provider. Seasoning and other foods Bouillon, broth, or soups made from recommended foods. What foods are not recommended? The items listed may not be a complete list. Talk with your health care provider about what dietary choices are best for you. Grains Whole   grain, whole wheat, bran, or rye breads, rolls, pastas, and crackers. Wild or brown rice. Whole grain or bran cereals. Barley.  Oats and oatmeal. Corn tortillas or taco shells. Granola. Popcorn. Vegetables Raw vegetables. Fried vegetables. Cabbage, broccoli, Brussels sprouts, artichokes, baked beans, beet greens, corn, kale, legumes, peas, sweet potatoes, and yams. Potato skins. Cooked spinach and cabbage. Fruits Dried fruit, including raisins and dates. Raw fruits. Stewed or dried prunes. Canned fruits with syrup. Meat and other protein foods Fried or fatty meats. Deli meats. Chunky nut butters. Nuts and seeds. Beans and lentils. Bacon. Hot dogs. Sausage. Dairy High-fat cheeses. Whole milk, chocolate milk, and beverages made with milk, such as milk shakes. Half-and-half. Cream. sour cream. Ice cream. Beverages Caffeinated beverages (such as coffee, tea, soda, or energy drinks). Alcoholic beverages. Fruit juices with pulp. Prune juice. Soft drinks sweetened with high-fructose corn syrup or sugar alcohols. High-calorie sports drinks. Fats and oils Butter. Cream sauces. Margarine. Salad oils. Plain salad dressings. Olives. Avocados. Mayonnaise. Sweets and desserts Sweet rolls, doughnuts, and sweet breads. Sugar-free desserts sweetened with sugar alcohols such as xylitol and sorbitol. Seasoning and other foods Honey. Hot sauce. Chili powder. Gravy. Cream-based or milk-based soups. Pancakes and waffles. Summary  When you have diarrhea, the foods you eat and your eating habits are very important.  Make sure you get at least 8-10 cups of fluid each day, or enough to keep your urine clear or pale yellow.  Eat bland foods and gradually reintroduce healthy, nutrient-rich foods as tolerated, or as told by your health care provider.  Avoid high-fiber, fried, greasy, or spicy foods. This information is not intended to replace advice given to you by your health care provider. Make sure you discuss any questions you have with your health care provider. Document Revised: 08/31/2018 Document Reviewed: 05/07/2016 Elsevier Patient  Education  2020 [employer]  

## 2020-04-09 NOTE — Progress Notes (Signed)
BP 115/73   Pulse 81   Temp 98.4 F (36.9 C)   Wt 179 lb 12.8 oz (81.6 kg)   LMP  (LMP Unknown)   SpO2 97%   BMI 30.86 kg/m    Subjective:    Patient ID: Mary Parks, female    DOB: Jun 09, 1950, 69 y.o.   MRN: [id number]  HPI: Mary Parks is a 69 y.o. female  Chief Complaint  Patient presents with  . Hospitalization Follow-up    pt was admitted on 11/13 to hospital for vomit and diarrhea was discharged on 11/14. Pt states she is stiill having loose stools and gas, pt states she has nausea    Transition of Care Hospital Follow up.  She reports food poisoning from [facility] on Saturday evening around 4 pm.  Ate chili and then around 4:30 was "sick as a dog".  She reports diarrhea, vomiting, and passing out which led her to hospital.  She reports she is starting to feel better, until she eats -- still having some occasional nausea.  Is also still feeling some fatigue.  Was not sent home with medication for nausea.  She is able to eat a little at this time and is drinking a lot of fluids.  Is passing bowels well, occasional watery stool present.    No recent abx use.  Denies fever, chills, diaphoresis, SOB, CP, constipation, blood in stool.    "Mary Parks is a 69 year old woman with medical history significant for insulin-dependent diabetes, hypertension, CKD stage III, hypothyroidism, COPD.  She presented to the hospital because of vomiting, diarrhea and syncope.  She said she had eating [employer] and developed vomiting and diarrhea when she got home.  She passed out in the past and while using the commode.  She said she was unable to get up since that time to get up from the floor made her dizzy.  She was admitted to the hospital for acute gastroenteritis and syncope likely due to dehydration from gastroenteritis.  She was treated with IV fluids. Vomiting and diarrhea have resolved and she is not any symptoms since admission.  There was no evidence of orthostatic  hypotension after hydration with IV fluids.  She has been able to ambulate without any symptoms.  Her condition has improved and she is deemed stable for discharge to home."  Hospital/Facility: [facility] D/C Physician: Dr. Mal Misty D/C Date: 04/06/20  Records Requested: 04/09/20 Records Received: 04/09/20 Records Reviewed: 04/09/20  Diagnoses on Discharge: syncope and collapse, acute gastroenteritis  Date of interactive Contact within 48 hours of discharge:  Contact was through: phone  Date of 7 day or 14 day face-to-face visit:    within 7 days  Outpatient Encounter Medications as of 04/09/2020  Medication Sig  . albuterol (VENTOLIN HFA) 108 (90 Base) MCG/ACT inhaler Inhale 2 puffs into the lungs every 6 (six) hours as needed for wheezing or shortness of breath.  Marland Kitchen amLODipine (NORVASC) 2.5 MG tablet Take 2.5 mg by mouth daily.  Marland Kitchen aspirin EC 81 MG tablet Take 81 mg by mouth daily.  Marland Kitchen atorvastatin (LIPITOR) 40 MG tablet Take 1 tablet (40 mg total) by mouth at bedtime.  . budesonide-formoterol (SYMBICORT) 160-4.5 MCG/ACT inhaler Inhale 2 puffs into the lungs 2 (two) times daily.  . calcium-vitamin D (OSCAL WITH D) 500-200 MG-UNIT tablet Take 1 tablet by mouth.  . dexlansoprazole (DEXILANT) 60 MG capsule Take 60 mg by mouth daily.  . fluticasone (FLONASE) 50 MCG/ACT nasal spray USE 2 SPRAY(S)  IN EACH NOSTRIL ONCE DAILY  . insulin lispro (HUMALOG) 100 UNIT/ML KwikPen INJECT 8 UNITS SUBCTUANEOUSLY BEFORE BREAKFAST, 6 UNITS BEFORE LUNCH, AND 6 UNITS BEFORE EVENING MEAL.  Marland Kitchen LANTUS SOLOSTAR 100 UNIT/ML Solostar Pen INJECT 40 UNITS SUBCUTANEOUSLY TWICE DAILY  . levothyroxine (SYNTHROID) 88 MCG tablet Take 88 mcg by mouth every morning.  Marland Kitchen lisinopril (ZESTRIL) 20 MG tablet Take 1 tablet by mouth once daily  . meclizine (ANTIVERT) 12.5 MG tablet Take 1 tablet (12.5 mg total) by mouth 3 (three) times daily as needed for dizziness.  . metoprolol tartrate (LOPRESSOR) 25 MG tablet Take 25 mg by mouth 2  (two) times daily.  . montelukast (SINGULAIR) 10 MG tablet Take 1 tablet (10 mg total) by mouth at bedtime.  . ONE TOUCH ULTRA TEST test strip 1 each by Other route 2 (two) times daily.   Mary Parks DELICA LANCETS 17B MISC 2 (two) times daily.   . TRULICITY 1.5 LT/9.0ZE SOPN INJECT 1.$RemoveBefor'5MG'uAsoAfYNXjsy$  INTO THE SKIN ONCE A WEEK  . ondansetron (ZOFRAN) 4 MG tablet Take 1 tablet (4 mg total) by mouth every 8 (eight) hours as needed for nausea or vomiting.   No facility-administered encounter medications on file as of 04/09/2020.    Diagnostic Tests Reviewed/Disposition: Labs reviewed and noted below -- H/H 11/33.8, CRT 1.14, GFR 51  EXAM: ABDOMEN - 2 VIEW  COMPARISON:  01/16/2018  FINDINGS: Supine and upright frontal views of the abdomen and pelvis are obtained. Bowel gas pattern is unremarkable without obstruction or ileus. No masses or abnormal calcifications. No free gas in the greater peritoneal sac. Lung bases are clear. Severe spondylosis and facet hypertrophy at the lumbosacral junction.  IMPRESSION: 1. Unremarkable bowel gas pattern.   Electronically Signed   By: Randa Ngo M.D.   On: 04/05/2020 21:29  Consults: none  Discharge Instructions: follow-up with PCP in one week  Disease/illness Education: reviewed with patient at [place] Discussions/Referrals: none  Establishment or re-establishment of referral orders for community resources: none  Discussion with other health care providers: reviewed recent notes  Assessment and Support of treatment regimen adherence: reviewed with patient today  Appointments Coordinated with: reviewed with patient today  Education for self-management, independent living, and ADLs: reviewed with patient today  DIABETES Followed by Dr. Rosario Jacks, saw him 03/17/20.  A1C was elevated at 9.3%, recent in hospital was 8.7%.  Currently taking Trulicity and Lantus (44 units twice daily) and Humalog 8 in morning, 6  lunch, and 6 in evening. Reviewof chartover past year A1C has remained in 8-9 range.  Hypoglycemic episodes: none Polydipsia/polyuria:no Visual disturbance:no Chest pain:no Paresthesias:no Glucose Monitoring:yes Accucheck frequency: BID Fasting glucose: 118 to 130   Post prandial: Evening: 200 range Before meals: Taking Insulin?:yes Long acting insulin: 44 units Lantus BID Short acting insulin: Humalog 8 in morning, 6 lunch, and 6 in evening Blood Pressure Monitoring:a few times a week Retinal Examination:Not Up To Date Foot Exam:Up to Date Pneumovax:Up to Date Influenza:Up to Date Aspirin:yes  HYPOTHYROIDISM Followed by endocrinology, taking 88 MCG Levothyroxine.  Last TSH 1.710 in August. Thyroid control status:stable Satisfied with current treatment? yes Medication side effects: no Medication compliance: good compliance Etiology of hypothyroidism:  Recent dose adjustment:no Fatigue: no Cold intolerance: no Heat intolerance: no Weight gain: no Weight loss: no Constipation: no Diarrhea/loose stools: no Palpitations: no Lower extremity edema: no Anxiety/depressed mood: no   CHRONIC KIDNEY DISEASE Goes to nephrology.  Continues on Lisinopril. August 2021 labs CRT 1.27 and GFR 49,  BUN 18. CKD status: stable Medications renally dose: yes Previous renal evaluation: yes Pneumovax:  Up to Date Influenza Vaccine:  Up to Date  HYPERTENSION / HYPERLIPIDEMIA Current medications include Metoprolol, Lisinopril, and Amlodipine for HTN + Lipitor for HLD.  Sees nephrology at [facility], last saw in May and no changes made. Satisfied with current treatment?yes Duration of hypertension:chronic BP monitoring frequency:a few times a week BP range:120-130/80's BP medication side effects:no Duration of hyperlipidemia:chronic Cholesterol medication side effects:no Cholesterol  supplements: none Medication compliance:good compliance Aspirin:yes Recent stressors:no Recurrent headaches:no Visual changes:no Palpitations:no Dyspnea:no Chest pain:no Lower extremity edema:no Dizzy/lightheaded:no  Relevant past medical, surgical, family and social history reviewed and updated as indicated. Interim medical history since our last visit reviewed. Allergies and medications reviewed and updated.  Review of Systems  Constitutional: Negative for activity change, appetite change, diaphoresis, fatigue and fever.  Respiratory: Negative for cough, chest tightness, shortness of breath and wheezing.   Cardiovascular: Negative for chest pain, palpitations and leg swelling.  Gastrointestinal: Positive for diarrhea (occasional) and nausea (occasional). Negative for abdominal distention, abdominal pain, constipation and vomiting.  Endocrine: Negative for cold intolerance, heat intolerance, polydipsia, polyphagia and polyuria.  Neurological: Positive for dizziness (very rare). Negative for syncope, weakness, light-headedness, numbness and headaches.  Psychiatric/Behavioral: Negative.     Per HPI unless specifically indicated above     Objective:    BP 115/73   Pulse 81   Temp 98.4 F (36.9 C)   Wt 179 lb 12.8 oz (81.6 kg)   LMP  (LMP Unknown)   SpO2 97%   BMI 30.86 kg/m   Wt Readings from Last 3 Encounters:  04/09/20 179 lb 12.8 oz (81.6 kg)  04/06/20 183 lb 9.6 oz (83.3 kg)  01/11/20 182 lb 12.8 oz (82.9 kg)    Physical Exam Vitals and nursing note reviewed.  Constitutional:      General: She is awake. She is not in acute distress.    Appearance: She is well-developed and well-groomed. She is obese. She is not ill-appearing.  HENT:     Head: Normocephalic.     Right Ear: Hearing and external ear normal.     Left Ear: Hearing and external ear normal.     Nose: Nose normal.  Eyes:     General: Lids are normal.        Right eye: No discharge.         Left eye: No discharge.     Conjunctiva/sclera: Conjunctivae normal.     Pupils: Pupils are equal, round, and reactive to light.  Neck:     Vascular: No carotid bruit.  Cardiovascular:     Rate and Rhythm: Normal rate and regular rhythm.     Heart sounds: Normal heart sounds.  Pulmonary:     Effort: Pulmonary effort is normal. No accessory muscle usage or respiratory distress.     Breath sounds: Normal breath sounds.  Abdominal:     General: Bowel sounds are normal. There is no distension.     Palpations: Abdomen is soft. There is no hepatomegaly.     Tenderness: There is no abdominal tenderness. There is no right CVA tenderness or left CVA tenderness.     Hernia: No hernia is present.  Musculoskeletal:     Cervical back: Normal range of motion and neck supple.     Right lower leg: No edema.     Left lower leg: No edema.  Lymphadenopathy:     Cervical: No cervical adenopathy.  Skin:  General: Skin is warm and dry.  Neurological:     Mental Status: She is alert and oriented to person, place, and time.     Cranial Nerves: Cranial nerves are intact.     Motor: Motor function is intact.     Gait: Gait is intact.     Deep Tendon Reflexes:     Reflex Scores:      Brachioradialis reflexes are 2+ on the right side and 2+ on the left side.      Patellar reflexes are 2+ on the right side and 2+ on the left side. Psychiatric:        Attention and Perception: Attention normal.        Mood and Affect: Mood normal.        Speech: Speech normal.        Behavior: Behavior normal. Behavior is cooperative.        Thought Content: Thought content normal.    Results for orders placed or performed during the hospital encounter of 04/05/20  Respiratory Panel by RT PCR (Flu A&B, Covid) - Nasopharyngeal Swab   Specimen: Nasopharyngeal Swab  Result Value Ref Range   SARS Coronavirus 2 by RT PCR NEGATIVE NEGATIVE   Influenza A by PCR NEGATIVE NEGATIVE   Influenza B by PCR NEGATIVE NEGATIVE   Comprehensive metabolic panel  Result Value Ref Range   Sodium 136 135 - 145 mmol/L   Potassium 3.8 3.5 - 5.1 mmol/L   Chloride 105 98 - 111 mmol/L   CO2 22 22 - 32 mmol/L   Glucose, Bld 185 (H) 70 - 99 mg/dL   BUN 20 8 - 23 mg/dL   Creatinine, Ser 1.34 (H) 0.44 - 1.00 mg/dL   Calcium 8.6 (L) 8.9 - 10.3 mg/dL   Total Protein 7.6 6.5 - 8.1 g/dL   Albumin 3.8 3.5 - 5.0 g/dL   AST 27 15 - 41 U/L   ALT 24 0 - 44 U/L   Alkaline Phosphatase 76 38 - 126 U/L   Total Bilirubin 0.9 0.3 - 1.2 mg/dL   GFR, Estimated 42 (L) >60 mL/[place]   Anion gap 9 5 - 15  Lipase, blood  Result Value Ref Range   Lipase 22 11 - 51 U/L  CK  Result Value Ref Range   Total CK 102 38.0 - 234.0 U/L  CBC with Differential  Result Value Ref Range   WBC 6.0 4.0 - 10.5 K/uL   RBC 4.53 3.87 - 5.11 MIL/uL   Hemoglobin 12.4 12.0 - 15.0 g/dL   HCT 39.2 36 - 46 %   MCV 86.5 80.0 - 100.0 fL   MCH 27.4 26.0 - 34.0 pg   MCHC 31.6 30.0 - 36.0 g/dL   RDW 14.6 11.5 - 15.5 %   Platelets 175 150 - 400 K/uL   nRBC 0.0 0.0 - 0.2 %   Neutrophils Relative % 86 %   Neutro Abs 5.1 1.7 - 7.7 K/uL   Lymphocytes Relative 8 %   Lymphs Abs 0.5 (L) 0.7 - 4.0 K/uL   Monocytes Relative 6 %   Monocytes Absolute 0.4 0.1 - 1.0 K/uL   Eosinophils Relative 0 %   Eosinophils Absolute 0.0 0.0 - 0.5 K/uL   Basophils Relative 0 %   Basophils Absolute 0.0 0.0 - 0.1 K/uL   Immature Granulocytes 0 %   Abs Immature Granulocytes 0.02 0.00 - 0.07 K/uL  Hemoglobin A1c  Result Value Ref Range   Hgb A1c MFr Bld 8.7 (  H) 4.8 - 5.6 %   Mean Plasma Glucose 202.99 mg/dL  Basic metabolic panel  Result Value Ref Range   Sodium 136 135 - 145 mmol/L   Potassium 3.7 3.5 - 5.1 mmol/L   Chloride 108 98 - 111 mmol/L   CO2 22 22 - 32 mmol/L   Glucose, Bld 115 (H) 70 - 99 mg/dL   BUN 15 8 - 23 mg/dL   Creatinine, Ser 1.14 (H) 0.44 - 1.00 mg/dL   Calcium 8.0 (L) 8.9 - 10.3 mg/dL   GFR, Estimated 51 (L) >60 mL/[place]   Anion gap 6 5 - 15  CBC  Result  Value Ref Range   WBC 3.5 (L) 4.0 - 10.5 K/uL   RBC 4.06 3.87 - 5.11 MIL/uL   Hemoglobin 11.0 (L) 12.0 - 15.0 g/dL   HCT 33.8 (L) 36 - 46 %   MCV 83.3 80.0 - 100.0 fL   MCH 27.1 26.0 - 34.0 pg   MCHC 32.5 30.0 - 36.0 g/dL   RDW 14.6 11.5 - 15.5 %   Platelets 156 150 - 400 K/uL   nRBC 0.0 0.0 - 0.2 %  Glucose, capillary  Result Value Ref Range   Glucose-Capillary 112 (H) 70 - 99 mg/dL  Glucose, capillary  Result Value Ref Range   Glucose-Capillary 67 (L) 70 - 99 mg/dL   Comment 1 Notify RN   Glucose, capillary  Result Value Ref Range   Glucose-Capillary 123 (H) 70 - 99 mg/dL  Glucose, capillary  Result Value Ref Range   Glucose-Capillary 79 70 - 99 mg/dL   Comment 1 Notify RN   CBG monitoring, ED  Result Value Ref Range   Glucose-Capillary 195 (H) 70 - 99 mg/dL  Troponin I (High Sensitivity)  Result Value Ref Range   Troponin I (High Sensitivity) 3 <18 ng/L  Troponin I (High Sensitivity)  Result Value Ref Range   Troponin I (High Sensitivity) 3 <18 ng/L      Assessment & Plan:   Problem List Items Addressed This Visit      Cardiovascular and Mediastinum   Hypertension 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  Continue current medication regimen, Lisinopril for kidney protection with diabetes and collaboration with [facility] nephrology.  Recommend she monitor BP at home at least 3 mornings a week.  Obtain CMP today.  Focus on DASH diet at home.  Return in 3 months.        Digestive   Acute gastroenteritis - Primary    Acute with some improvement, not 100%.  Will send in Zofran to assist with nausea as needed.  Recommend avoid Imodium.  Start BLAND diet at home and ensure good water intake.  Will assess labs today: CMP, Lipase, Amylase, CBC, and stool sample kit sent home with patient.  On review stool specimen not obtained in hospital.  Return in 2 weeks for follow-up, sooner if worsening.       Relevant Medications   ondansetron (ZOFRAN) 4 MG tablet   Other Relevant Orders   Comprehensive metabolic panel   Amylase   Lipase   Cdiff NAA+O+P+Stool Culture     Endocrine   Diabetes mellitus with chronic kidney disease (HCC)    Chronic, ongoing.  A1C with A1C 8.7% in hospital, recommend focus on diet.  Urine ALB 150 and A:C 30-300 last visit.  Continue current medication regimen and collaboration with endocrinology and nephrology. CMP  today, continue Lisinopril for kidney protection.  Return in 6 months.  May benefit from trial of [facility] in future.      Hyperlipidemia associated with type 2 diabetes mellitus (HCC)    Chronic, ongoing.  Continue current medication regimen and adjust as needed.  Lipid panel today.      Relevant Orders   Lipid Panel w/o Chol/HDL Ratio   Hypothyroidism    S/P thyroidectomy years ago.  Followed by Dr. Rosario Jacks with endo.  Continue current medication regimen and adjust as needed.  Current adjustments made by endo.         Genitourinary   Chronic kidney disease, stage 3a (HCC)    Chronic, ongoing.  Continue collaboration with [facility] nephrology.  Lisinopril for kidney protection.  Recent CMP today.        Other   Anemia    Noted on recent hospital labs, recheck CBC today and check B12, iron, ferritin.  Has underlying CKD 3.  If low levels consider adding supplement or referral to GI for further assessment.      Relevant Orders   CBC with Differential/Platelet   Iron, TIBC and Ferritin Panel   B12      Time: 30 minutes, >50% spent counseling/or care coordination  Follow up plan: Return in about 2 weeks (around 04/23/2020) for Gastroenteritis.

## 2020-04-09 NOTE — Assessment & Plan Note (Signed)
Chronic, ongoing.  Continue current medication regimen and adjust as needed. Lipid panel today. 

## 2020-04-09 NOTE — Assessment & Plan Note (Signed)
Chronic, ongoing.  Continue collaboration with [facility] nephrology.  Lisinopril for kidney protection.  Recent CMP today.

## 2020-04-09 NOTE — Assessment & Plan Note (Signed)
Acute with some improvement, not 100%.  Will send in Zofran to assist with nausea as needed.  Recommend avoid Imodium.  Start BLAND diet at home and ensure good water intake.  Will assess labs today: CMP, Lipase, Amylase, CBC, and stool sample kit sent home with patient.  On review stool specimen not obtained in hospital.  Return in 2 weeks for follow-up, sooner if worsening.

## 2020-04-09 NOTE — Assessment & Plan Note (Signed)
Chronic, stable with BP at goal today.  Continue current medication regimen, Lisinopril for kidney protection with diabetes and collaboration with [facility] nephrology.  Recommend she monitor BP at home at least 3 mornings a week.  Obtain CMP today.  Focus on DASH diet at home.  Return in 3 months.

## 2020-04-09 NOTE — Assessment & Plan Note (Signed)
Noted on recent hospital labs, recheck CBC today and check B12, iron, ferritin.  Has underlying CKD 3.  If low levels consider adding supplement or referral to GI for further assessment.

## 2020-04-10 LAB — CBC WITH DIFFERENTIAL/PLATELET
Basophils Absolute: 0 10*3/uL (ref 0.0–0.2)
Basos: 0 %
EOS (ABSOLUTE): 0.1 10*3/uL (ref 0.0–0.4)
Eos: 4 %
Hematocrit: 35 % (ref 34.0–46.6)
Hemoglobin: 11.5 g/dL (ref 11.1–15.9)
Immature Grans (Abs): 0 10*3/uL (ref 0.0–0.1)
Immature Granulocytes: 0 %
Lymphocytes Absolute: 0.8 10*3/uL (ref 0.7–3.1)
Lymphs: 24 %
MCH: 26.4 pg — ABNORMAL LOW (ref 26.6–33.0)
MCHC: 32.9 g/dL (ref 31.5–35.7)
MCV: 81 fL (ref 79–97)
Monocytes Absolute: 0.4 10*3/uL (ref 0.1–0.9)
Monocytes: 12 %
NRBC: 1 % — ABNORMAL HIGH (ref 0–0)
Neutrophils Absolute: 2 10*3/uL (ref 1.4–7.0)
Neutrophils: 60 %
Platelets: 211 10*3/uL (ref 150–450)
RBC: 4.35 x10E6/uL (ref 3.77–5.28)
RDW: 14.4 % (ref 11.7–15.4)
WBC: 3.3 10*3/uL — ABNORMAL LOW (ref 3.4–10.8)

## 2020-04-10 LAB — LIPID PANEL W/O CHOL/HDL RATIO
Cholesterol, Total: 119 mg/dL (ref 100–199)
HDL: 25 mg/dL — ABNORMAL LOW (ref >39)
LDL Chol Calc (NIH): 70 mg/dL (ref 0–99)
Triglycerides: 133 mg/dL (ref 0–149)
VLDL Cholesterol Cal: 24 mg/dL (ref 5–40)

## 2020-04-10 LAB — IRON,TIBC AND FERRITIN PANEL
Ferritin: 62 ng/mL (ref 15–150)
Iron Saturation: 18 % (ref 15–55)
Iron: 49 ug/dL (ref 27–139)
Total Iron Binding Capacity: 270 ug/dL (ref 250–450)
UIBC: 221 ug/dL (ref 118–369)

## 2020-04-10 LAB — COMPREHENSIVE METABOLIC PANEL
ALT: 22 IU/L (ref 0–32)
AST: 26 IU/L (ref 0–40)
Albumin/Globulin Ratio: 1.4 (ref 1.2–2.2)
Albumin: 3.9 g/dL (ref 3.7–4.7)
Alkaline Phosphatase: 83 IU/L (ref 44–121)
BUN/Creatinine Ratio: 8 — ABNORMAL LOW (ref 12–28)
BUN: 8 mg/dL (ref 8–27)
Bilirubin Total: 0.4 mg/dL (ref 0.0–1.2)
CO2: 19 mmol/L — ABNORMAL LOW (ref 20–29)
Calcium: 9.1 mg/dL (ref 8.7–10.3)
Chloride: 109 mmol/L — ABNORMAL HIGH (ref 96–106)
Creatinine, Ser: 1.04 mg/dL — ABNORMAL HIGH (ref 0.57–1.00)
GFR calc Af Amer: 62 mL/min/{1.73_m2} (ref >59)
GFR calc non Af Amer: 54 mL/min/{1.73_m2} — ABNORMAL LOW (ref >59)
Globulin, Total: 2.7 g/dL (ref 1.5–4.5)
Glucose: 86 mg/dL (ref 65–99)
Potassium: 4.1 mmol/L (ref 3.5–5.2)
Sodium: 143 mmol/L (ref 134–144)
Total Protein: 6.6 g/dL (ref 6.0–8.5)

## 2020-04-10 LAB — LIPASE: Lipase: 21 U/L (ref 14–85)

## 2020-04-10 LAB — AMYLASE: Amylase: 50 U/L (ref 31–110)

## 2020-04-10 LAB — VITAMIN B12: Vitamin B-12: 541 pg/mL (ref 232–1245)

## 2020-04-14 ENCOUNTER — Other Ambulatory Visit: Payer: Self-pay | Admitting: Nurse Practitioner

## 2020-04-28 ENCOUNTER — Other Ambulatory Visit: Payer: Self-pay | Admitting: Nurse Practitioner

## 2020-04-30 ENCOUNTER — Other Ambulatory Visit: Payer: Self-pay

## 2020-04-30 ENCOUNTER — Ambulatory Visit (INDEPENDENT_AMBULATORY_CARE_PROVIDER_SITE_OTHER): Payer: Medicare Other | Admitting: Nurse Practitioner

## 2020-04-30 ENCOUNTER — Encounter: Payer: Self-pay | Admitting: Nurse Practitioner

## 2020-04-30 VITALS — BP 127/76 | HR 73 | Temp 98.3°F | Ht 64.09 in | Wt 179.4 lb

## 2020-04-30 DIAGNOSIS — J320 Chronic maxillary sinusitis: Secondary | ICD-10-CM | POA: Insufficient documentation

## 2020-04-30 DIAGNOSIS — J0100 Acute maxillary sinusitis, unspecified: Secondary | ICD-10-CM

## 2020-04-30 DIAGNOSIS — J3489 Other specified disorders of nose and nasal sinuses: Secondary | ICD-10-CM | POA: Diagnosis not present

## 2020-04-30 DIAGNOSIS — K529 Noninfective gastroenteritis and colitis, unspecified: Secondary | ICD-10-CM

## 2020-04-30 MED ORDER — AMOXICILLIN-POT CLAVULANATE 875-125 MG PO TABS: 1.0000 | ORAL_TABLET | Freq: Two times a day (BID) | ORAL | 0 refills | Status: AC

## 2020-04-30 NOTE — Progress Notes (Signed)
BP 127/76   Pulse 73   Temp 98.3 F (36.8 C) (Oral)   Ht 5' 4.09" (1.628 m)   Wt 179 lb 6.4 oz (81.4 kg)   LMP  (LMP Unknown)   SpO2 98%   BMI 30.70 kg/m    Subjective:    Patient ID: Mary Parks, female    DOB: [birth year], 69 y.o.   MRN: [id number]  HPI: Mary Parks is a 69 y.o. female  Chief Complaint  Patient presents with  . Follow up Gastroenteritis    doing better.   . Sinus pressure    Pressure, ear, and facial pain has been present since last weekend.  . Ear Pain  . Facial Pain  . Nasal Congestion  . Cough   GASTROENTERITIS Was in hospital 04/05/20 for gastritis and then seen 04/09/20 by PCP, overall feeling better at this time.  Reports no further symptoms.   Fever: no Nausea: no Vomiting: no Weight loss: no Decreased appetite: no Diarrhea: no Constipation: no Blood in stool: no Heartburn: no Jaundice: no Rash: no Dysuria/urinary frequency: no Hematuria: no History of sexually transmitted disease: no Recurrent NSAID use: no   UPPER RESPIRATORY TRACT INFECTION Started about 04/23/20.  Started out with eyes hurting and then ears hurting, then into throat with drainage at night.  Lots of pressure in eyes.  Has had Covid vaccines x 3 -- had booster 03/13/20.  Denies loss of taste or smell. Fever: no Cough: a little with drainage Shortness of breath: no Wheezing: no Chest pain: no Chest tightness: no Chest congestion: no Nasal congestion: yes Runny nose: yes Post nasal drip: yes Sneezing: no Sore throat: slightly  Swollen glands: no Sinus pressure: yes Headache: yes Face pain: yes Toothache: yes -- pressure on left Ear pain: yes bilateral Ear pressure: yes bilateral Eyes red/itching:no Eye drainage/crusting: no  Vomiting: no Rash: no Fatigue: yes Sick contacts: no Strep contacts: no  Context: fluctuating Recurrent sinusitis: no Relief with OTC cold/cough medications: yes  Treatments attempted: mucinex   Relevant past  medical, surgical, family and social history reviewed and updated as indicated. Interim medical history since our last visit reviewed. Allergies and medications reviewed and updated.  Review of Systems  Constitutional: Positive for fatigue. Negative for activity change, appetite change, diaphoresis and fever.  HENT: Positive for congestion, ear pain, postnasal drip, rhinorrhea, sinus pressure and sinus pain.   Respiratory: Positive for cough. Negative for chest tightness, shortness of breath and wheezing.   Cardiovascular: Negative for chest pain, palpitations and leg swelling.  Gastrointestinal: Negative for abdominal distention, abdominal pain, constipation, diarrhea, nausea and vomiting.  Endocrine: Negative for cold intolerance, heat intolerance, polydipsia, polyphagia and polyuria.  Neurological: Positive for headaches. Negative for dizziness, syncope, weakness, light-headedness and numbness.  Psychiatric/Behavioral: Negative.     Per HPI unless specifically indicated above     Objective:    BP 127/76   Pulse 73   Temp 98.3 F (36.8 C) (Oral)   Ht 5' 4.09" (1.628 m)   Wt 179 lb 6.4 oz (81.4 kg)   LMP  (LMP Unknown)   SpO2 98%   BMI 30.70 kg/m   Wt Readings from Last 3 Encounters:  04/30/20 179 lb 6.4 oz (81.4 kg)  04/09/20 179 lb 12.8 oz (81.6 kg)  04/06/20 183 lb 9.6 oz (83.3 kg)    Physical Exam Vitals and nursing note reviewed.  Constitutional:      General: She is awake. She is not in acute distress.  Appearance: She is well-developed and well-groomed. She is obese. She is not ill-appearing.  HENT:     Head: Normocephalic.     Right Ear: Hearing, ear canal and external ear normal. A middle ear effusion is present.     Left Ear: Hearing, ear canal and external ear normal. A middle ear effusion is present.     Nose:     Right Sinus: Maxillary sinus tenderness present. No frontal sinus tenderness.     Left Sinus: Maxillary sinus tenderness present. No frontal  sinus tenderness.     Mouth/Throat:     Mouth: Mucous membranes are moist.     Pharynx: Posterior oropharyngeal erythema present. No pharyngeal swelling or oropharyngeal exudate.     Tonsils: No tonsillar exudate.  Eyes:     General: Lids are normal.        Right eye: No discharge.        Left eye: No discharge.     Conjunctiva/sclera: Conjunctivae normal.     Pupils: Pupils are equal, round, and reactive to light.  Neck:     Vascular: No carotid bruit.  Cardiovascular:     Rate and Rhythm: Normal rate and regular rhythm.     Heart sounds: Normal heart sounds.  Pulmonary:     Effort: Pulmonary effort is normal. No accessory muscle usage or respiratory distress.     Breath sounds: Normal breath sounds.  Abdominal:     General: Bowel sounds are normal. There is no distension.     Palpations: Abdomen is soft. There is no hepatomegaly.     Tenderness: There is no abdominal tenderness. There is no right CVA tenderness or left CVA tenderness.     Hernia: No hernia is present.  Musculoskeletal:     Cervical back: Normal range of motion and neck supple.     Right lower leg: No edema.     Left lower leg: No edema.  Lymphadenopathy:     Cervical: No cervical adenopathy.  Skin:    General: Skin is warm and dry.  Neurological:     Mental Status: She is alert and oriented to person, place, and time.     Cranial Nerves: Cranial nerves are intact.     Motor: Motor function is intact.     Gait: Gait is intact.     Deep Tendon Reflexes:     Reflex Scores:      Brachioradialis reflexes are 2+ on the right side and 2+ on the left side.      Patellar reflexes are 2+ on the right side and 2+ on the left side. Psychiatric:        Attention and Perception: Attention normal.        Mood and Affect: Mood normal.        Speech: Speech normal.        Behavior: Behavior normal. Behavior is cooperative.        Thought Content: Thought content normal.    Results for orders placed or performed in  visit on 04/09/20  CBC with Differential/Platelet  Result Value Ref Range   WBC 3.3 (L) 3.4 - 10.8 x10E3/uL   RBC 4.35 3.77 - 5.28 x10E6/uL   Hemoglobin 11.5 11.1 - 15.9 g/dL   Hematocrit 35.0 34.0 - 46.6 %   MCV 81 79 - 97 fL   MCH 26.4 (L) 26.6 - 33.0 pg   MCHC 32.9 31 - 35 g/dL   RDW 14.4 11.7 - 15.4 %   Platelets  211 150 - 450 x10E3/uL   Neutrophils 60 Not Estab. %   Lymphs 24 Not Estab. %   Monocytes 12 Not Estab. %   Eos 4 Not Estab. %   Basos 0 Not Estab. %   Neutrophils Absolute 2.0 1.40 - 7.00 x10E3/uL   Lymphocytes Absolute 0.8 0 - 3 x10E3/uL   Monocytes Absolute 0.4 0 - 0 x10E3/uL   EOS (ABSOLUTE) 0.1 0.0 - 0.4 x10E3/uL   Basophils Absolute 0.0 0 - 0 x10E3/uL   Immature Granulocytes 0 Not Estab. %   Immature Grans (Abs) 0.0 0.0 - 0.1 x10E3/uL   NRBC 1 (H) 0 - 0 %  Lipid Panel w/o Chol/HDL Ratio  Result Value Ref Range   Cholesterol, Total 119 100 - 199 mg/dL   Triglycerides 133 0 - 149 mg/dL   HDL 25 (L) >39 mg/dL   VLDL Cholesterol Cal 24 5 - 40 mg/dL   LDL Chol Calc (NIH) 70 0 - 99 mg/dL  Iron, TIBC and Ferritin Panel  Result Value Ref Range   Total Iron Binding Capacity 270 250 - 450 ug/dL   UIBC 221 118 - 369 ug/dL   Iron 49 27 - 139 ug/dL   Iron Saturation 18 15 - 55 %   Ferritin 62 15.0 - 150.0 ng/mL  B12  Result Value Ref Range   Vitamin B-12 541 232 - 1,245 pg/mL  Comprehensive metabolic panel  Result Value Ref Range   Glucose 86 65 - 99 mg/dL   BUN 8 8 - 27 mg/dL   Creatinine, Ser 1.04 (H) 0.57 - 1.00 mg/dL   GFR calc non Af Amer 54 (L) >59 mL/min/1.73   GFR calc Af Amer 62 >59 mL/min/1.73   BUN/Creatinine Ratio 8 (L) 12 - 28   Sodium 143 134 - 144 mmol/L   Potassium 4.1 3.5 - 5.2 mmol/L   Chloride 109 (H) 96 - 106 mmol/L   CO2 19 (L) 20 - 29 mmol/L   Calcium 9.1 8.7 - 10.3 mg/dL   Total Protein 6.6 6.0 - 8.5 g/dL   Albumin 3.9 3.7 - 4.7 g/dL   Globulin, Total 2.7 1.5 - 4.5 g/dL   Albumin/Globulin Ratio 1.4 1.2 - 2.2   Bilirubin Total 0.4  0.0 - 1.2 mg/dL   Alkaline Phosphatase 83 44 - 121 IU/L   AST 26 0 - 40 IU/L   ALT 22 0 - 32 IU/L  Amylase  Result Value Ref Range   Amylase 50 31 - 110 U/L  Lipase  Result Value Ref Range   Lipase 21 14 - 85 U/L      Assessment & Plan:   Problem List Items Addressed This Visit      Respiratory   Maxillary sinusitis    Acute x 7 days.  Will obtain Covid testing today, although low suspicion for this.  Although due to current pandemic will assess further, recommend she self quarantine until test results have returned and symptoms improved.  Script for Augmentin sent in and recommend to take with probiotic yogurt daily.  Will avoid Prednisone at this time due to T2DM and sugars.  Recommend OTC diabetic tussin for symptom relief.  Return to office for worsening or ongoing symptoms.      Relevant Medications   amoxicillin-clavulanate (AUGMENTIN) 875-125 MG tablet   Other Relevant Orders   Novel Coronavirus, NAA ([facility])     Digestive   Acute gastroenteritis - Primary    Acute and improving with no further symptoms present.  Continue to monitor and return to office as needed if return of symptoms presents.           Follow up plan: Return for as scheduled .

## 2020-04-30 NOTE — Assessment & Plan Note (Signed)
Acute x 7 days.  Will obtain Covid testing today, although low suspicion for this.  Although due to current pandemic will assess further, recommend she self quarantine until test results have returned and symptoms improved.  Script for Augmentin sent in and recommend to take with probiotic yogurt daily.  Will avoid Prednisone at this time due to T2DM and sugars.  Recommend OTC diabetic tussin for symptom relief.  Return to office for worsening or ongoing symptoms.

## 2020-04-30 NOTE — Patient Instructions (Signed)

## 2020-04-30 NOTE — Assessment & Plan Note (Signed)
Acute and improving with no further symptoms present.  Continue to monitor and return to office as needed if return of symptoms presents.

## 2020-05-01 LAB — SARS-COV-2, NAA 2 DAY TAT

## 2020-05-01 LAB — NOVEL CORONAVIRUS, NAA: SARS-CoV-2, NAA: NOT DETECTED

## 2020-05-01 NOTE — Progress Notes (Signed)
Please let Ms. Hakimian know her Covid testing has returned negative.  Great news.  Continue rest and fluids.  Have a great day!! Keep being awesome!!  Thank you for allowing me to participate in your care. Kindest regards, Ellice Boultinghouse

## 2020-05-14 ENCOUNTER — Other Ambulatory Visit: Payer: Self-pay | Admitting: Nurse Practitioner

## 2020-06-03 ENCOUNTER — Ambulatory Visit
Admission: EM | Admit: 2020-06-03 | Discharge: 2020-06-03 | Disposition: A | Discharge status: Home / Self Care | Payer: Medicare Other | Attending: Sports Medicine | Admitting: Sports Medicine

## 2020-06-03 ENCOUNTER — Other Ambulatory Visit: Payer: Self-pay

## 2020-06-03 DIAGNOSIS — R350 Frequency of micturition: Secondary | ICD-10-CM | POA: Diagnosis not present

## 2020-06-03 DIAGNOSIS — R300 Dysuria: Secondary | ICD-10-CM | POA: Diagnosis not present

## 2020-06-03 DIAGNOSIS — R102 Pelvic and perineal pain: Secondary | ICD-10-CM | POA: Insufficient documentation

## 2020-06-03 LAB — URINALYSIS, COMPLETE (UACMP) WITH MICROSCOPIC
Bilirubin Urine: NEGATIVE
Glucose, UA: NEGATIVE mg/dL
Ketones, ur: NEGATIVE mg/dL
Leukocytes,Ua: NEGATIVE
Nitrite: NEGATIVE
Protein, ur: 100 mg/dL — AB
Specific Gravity, Urine: 1.025 (ref 1.005–1.030)
pH: 5 (ref 5.0–8.0)

## 2020-06-03 MED ORDER — NITROFURANTOIN MONOHYD MACRO 100 MG PO CAPS: 100.0000 mg | ORAL_CAPSULE | Freq: Two times a day (BID) | ORAL | 0 refills | Status: DC

## 2020-06-03 NOTE — Discharge Instructions (Addendum)
Went ahead and got a UA.  It did show bacteria but no leukocytes or nitrites.  She also had trace blood.  Possible early kidney stone.  Her exam is reassuring.  Although she is complaining of flank pain her exam is normal. We will elect to send off the urine for culture.  I will also empirically treat her given her renal issues I do not want this to become a bigger problem for her. We will give her an educational handout on UTIs.  She needs to flush her system.  She could consider over-the-counter Azo to help assist with some of her symptoms.  She should drink plenty of water and cranberry juice. Tylenol or Motrin for fever or discomfort although she probably should stay away from the NSAIDs and stick just with Tylenol given her renal issues. Follow-up with [place] as needed.

## 2020-06-03 NOTE — ED Provider Notes (Signed)
[facility]    CSN: [id number] Arrival date & time: 06/03/20  1046      History   Chief Complaint Chief Complaint  Patient presents with  . Dysuria    HPI Mary Parks is a 70 y.o. female.   Pleasant 70 year old female who presents for evaluation of 2 days of urinary tract symptoms.  She has pain and burning on urination.  A little bit of right-sided back pain that radiates into the lower abdominal area.  Most of her pain is suprapubic.  She has increased frequency of urination and incomplete voiding.  She has had a few UTIs in her past.  No history of kidney stones.  She is try to keep up on her fluids but admits that she is probably not drink enough.  She has not taken any over-the-counter meds for this.  No fever shakes chills, nausea vomiting or diarrhea.  She denies any respiratory symptoms or COVID-like symptoms.  She has been vaccinated against COVID and has received the booster.  She is also been vaccinated against influenza.  No red flag signs or symptoms are offered on history by the patient.  Of note she does have diabetic nephropathy and associated hyperlipidemia and hypertension from her chronic kidney disease.     Past Medical History:  Diagnosis Date  . Asthma   . Chest pain   . DM2 (diabetes mellitus, type 2) ([place]) 1987  . Dyspnea    Cath normal coronaries. normal EF. RA 11. PA 37/17 (26) LVEDP 36  . GERD (gastroesophageal reflux disease)   . HTN (hypertension)   . Hyperlipidemia   . Obesity   . Osteoarthritis    knees  . Renal disease   . Thyroid disease    having thyroid removed 04/14/15  . Vertigo    none recently    Patient Active Problem List   Diagnosis Date Noted  . Maxillary sinusitis 04/30/2020  . Anemia 04/09/2020  . Hypothyroidism 04/06/2020  . Syncope and collapse 04/05/2020  . Acute gastroenteritis 04/05/2020  . Obesity 10/03/2019  . Hyperlipidemia associated with type 2 diabetes mellitus ([place]) 06/29/2017  . Benign  neoplasm of cecum   . Benign neoplasm of ascending colon   . Benign neoplasm of descending colon   . Allergic rhinitis 09/22/2016  . OA (osteoarthritis) of knee 06/25/2016  . Type 2 diabetes mellitus with diabetic neuropathy, with long-term current use of insulin ([place]) 10/03/2015  . Chronic kidney disease, stage 3a ([place]) 12/17/2014  . Hypertension associated with chronic kidney disease due to type 2 diabetes mellitus ([place]) 12/17/2014  . Diabetes mellitus with chronic kidney disease ([place]) 11/28/2014  . COPD (chronic obstructive pulmonary disease) ([place]) 11/19/2014    Past Surgical History:  Procedure Laterality Date  . CARDIAC CATHETERIZATION    . CATARACT EXTRACTION W/PHACO Right 03/31/2015   Procedure: CATARACT EXTRACTION PHACO AND INTRAOCULAR LENS PLACEMENT (IOC);  Surgeon: Ronnell Freshwater, MD;  Location: [place];  Service: Ophthalmology;  Laterality: Right;  DIABETIC - insulin  . COLONOSCOPY WITH PROPOFOL N/A 11/12/2016   Procedure: COLONOSCOPY WITH PROPOFOL;  Surgeon: [name], MD;  Location: [place];  Service: Endoscopy;  Laterality: N/A;  Diabetic - insulin  . EYE SURGERY    . HERNIA REPAIR     umbilical  . POLYPECTOMY  11/12/2016   Procedure: POLYPECTOMY INTESTINAL;  Surgeon: [name], MD;  Location: [place];  Service: Endoscopy;;  . THYROIDECTOMY N/A 04/14/2015   Procedure: THYROIDECTOMY;  Surgeon: Eddie Dibbles  Kathyrn Sheriff, MD;  Location: [facility];  Service: ENT;  Laterality: N/A;    OB History   No obstetric history on file.      Home Medications    Prior to Admission medications   Medication Sig Start Date End Date Taking? Authorizing Provider  nitrofurantoin, macrocrystal-monohydrate, (MACROBID) 100 MG capsule Take 1 capsule (100 mg total) by mouth 2 (two) times daily. 06/03/20  Yes Verda Cumins, MD  albuterol (VENTOLIN HFA) 108 (90 Base) MCG/ACT inhaler Inhale 2 puffs into the lungs every 6 (six) hours as needed for wheezing or  shortness of breath. 01/02/20   Cannady, Henrine Screws T, NP  amLODipine (NORVASC) 2.5 MG tablet Take 2.5 mg by mouth daily. 12/06/18   [provider]  aspirin EC 81 MG tablet Take 81 mg by mouth daily.    [provider]  atorvastatin (LIPITOR) 40 MG tablet TAKE 1 TABLET BY MOUTH AT BEDTIME 04/28/20   Cannady, Jolene T, NP  budesonide-formoterol (SYMBICORT) 160-4.5 MCG/ACT inhaler Inhale 2 puffs into the lungs 2 (two) times daily. 01/02/20   Cannady, Henrine Screws T, NP  calcium-vitamin D (OSCAL WITH D) 500-200 MG-UNIT tablet Take 1 tablet by mouth.    [provider]  dexlansoprazole (DEXILANT) 60 MG capsule Take 60 mg by mouth daily.    [provider]  fluticasone (FLONASE) 50 MCG/ACT nasal spray USE 2 SPRAY(S) IN EACH NOSTRIL ONCE DAILY 01/11/20   Cannady, Jolene T, NP  insulin lispro (HUMALOG) 100 UNIT/ML KwikPen INJECT 8 UNITS SUBCTUANEOUSLY BEFORE BREAKFAST, 6 UNITS BEFORE LUNCH, AND 6 UNITS BEFORE EVENING MEAL. 04/06/20   Jennye Boroughs, MD  LANTUS SOLOSTAR 100 UNIT/ML Solostar Pen INJECT 40 UNITS SUBCUTANEOUSLY TWICE DAILY 05/14/20   [name] T, NP  levothyroxine (SYNTHROID) 88 MCG tablet Take 88 mcg by mouth every morning. 08/13/19   [provider]  lisinopril (ZESTRIL) 20 MG tablet Take 1 tablet by mouth once daily 04/28/20   Cannady, Henrine Screws T, NP  meclizine (ANTIVERT) 12.5 MG tablet Take 1 tablet (12.5 mg total) by mouth 3 (three) times daily as needed for dizziness. 10/03/19   Cannady, Henrine Screws T, NP  metoprolol tartrate (LOPRESSOR) 25 MG tablet Take 25 mg by mouth 2 (two) times daily. 07/09/16   [provider]  montelukast (SINGULAIR) 10 MG tablet Take 1 tablet (10 mg total) by mouth at bedtime. 10/03/19   Cannady, Henrine Screws T, NP  ondansetron (ZOFRAN) 4 MG tablet Take 1 tablet (4 mg total) by mouth every 8 (eight) hours as needed for nausea or vomiting. 04/09/20   Cannady, Henrine Screws T, NP  ONE TOUCH ULTRA TEST test strip 1 each by Other route 2 (two)  times daily.  03/15/15   [provider]  Jonetta Speak LANCETS [id number] MISC 2 (two) times daily.  03/15/15   [provider]  TRULICITY 1.5 0000000 SOPN INJECT 1.5MG  INTO THE SKIN ONCE A WEEK 04/14/20   Venita Lick, NP    Family History Family History  Problem Relation Age of Onset  . Diabetes Mother   . Diabetes Father   . Diabetes Sister   . Diabetes Brother   . Diabetes Sister   . Diabetes Sister   . Diabetes Maternal Grandmother   . Diabetes Maternal Grandfather   . Diabetes Paternal Grandmother   . Diabetes Paternal Grandfather   . Heart disease Neg Hx   . Coronary artery disease Neg Hx   . Breast cancer Neg Hx     Social History Social History  Tobacco Use  . Smoking status: Never Smoker  . Smokeless tobacco: Never Used  Vaping Use  . Vaping Use: Never used  Substance Use Topics  . Alcohol use: No  . Drug use: No     Allergies   Celebrex [celecoxib], Sulfa antibiotics, Sulfisoxazole, and Sulfur   Review of Systems Review of Systems  Constitutional: Negative for activity change, appetite change, chills and fever.  HENT: Negative.   Eyes: Negative.   Respiratory: Negative.   Cardiovascular: Negative.   Gastrointestinal: Positive for abdominal pain. Negative for diarrhea, nausea and vomiting.  Endocrine: Negative for cold intolerance, heat intolerance, polydipsia, polyphagia and polyuria.  Genitourinary: Positive for dysuria, flank pain, frequency and urgency.  Skin: Negative for color change, pallor, rash and wound.  Neurological: Negative for headaches.  All other systems reviewed and are negative.    Physical Exam Triage Vital Signs ED Triage Vitals  Enc Vitals Group     BP 06/03/20 1348 139/74     Pulse Rate 06/03/20 1348 87     Resp 06/03/20 1348 16     Temp 06/03/20 1348 98.1 F (36.7 C)     Temp Source 06/03/20 1348 Oral     SpO2 06/03/20 1348 99 %     Weight 06/03/20 1346 183 lb (83 kg)     Height 06/03/20 1346  5' 4.5" (1.638 m)     Head Circumference --      Peak Flow --      Pain Score 06/03/20 1346 6     Pain Loc --      Pain Edu? --      Excl. in [place]? --    No data found.  Updated Vital Signs BP 139/74 (BP Location: Left Arm)   Pulse 87   Temp 98.1 F (36.7 C) (Oral)   Resp 16   Ht 5' 4.5" (1.638 m)   Wt 83 kg   LMP  (LMP Unknown)   SpO2 99%   BMI 30.93 kg/m   Visual Acuity Right Eye Distance:   Left Eye Distance:   Bilateral Distance:    Right Eye Near:   Left Eye Near:    Bilateral Near:     Physical Exam Vitals and nursing note reviewed.  Constitutional:      General: She is not in acute distress.    Appearance: Normal appearance. She is not ill-appearing or toxic-appearing.  HENT:     Head: Normocephalic and atraumatic.  Cardiovascular:     Rate and Rhythm: Normal rate and regular rhythm.     Pulses: Normal pulses.     Heart sounds: Normal heart sounds. No murmur heard. No friction rub. No gallop.   Pulmonary:     Effort: Pulmonary effort is normal. No respiratory distress.     Breath sounds: Normal breath sounds. No stridor. No wheezing, rhonchi or rales.  Abdominal:     General: Bowel sounds are normal. There is no distension.     Palpations: Abdomen is soft.     Tenderness: There is no abdominal tenderness. There is no right CVA tenderness, left CVA tenderness, guarding or rebound.     Comments: Localizes abdominal pain to the suprapubic region.  Abdomen is soft nontender nondistended.  Positive bowel sounds.  No CVA tenderness with ballottement of the kidneys bilaterally.  Skin:    General: Skin is warm and dry.  Neurological:     General: No focal deficit present.     Mental Status: She is alert and  oriented to person, place, and time.      UC Treatments / Results  Labs (all labs ordered are listed, but only abnormal results are displayed) Labs Reviewed  URINALYSIS, COMPLETE (UACMP) WITH MICROSCOPIC - Abnormal; Notable for the following  components:      Result Value   Hgb urine dipstick TRACE (*)    Protein, ur 100 (*)    Bacteria, UA FEW (*)    All other components within normal limits    EKG   Radiology No results found.  Procedures Procedures (including critical care time)  Medications Ordered in UC Medications - No data to display  Initial Impression / Assessment and Plan / UC Course  I have reviewed the triage vital signs and the nursing notes.  Pertinent labs & imaging results that were available during my care of the patient were reviewed by me and considered in my medical decision making (see chart for details).   Clinical impression: 2 days of suprapubic pain with burning on urination increased frequency and urgency.  Treatment plan: 1.  The findings and treatment plan were discussed in detail with the patient.  Patient was in agreement. 2.  Went ahead and got a UA.  It did show bacteria but no leukocytes or nitrites.  She also had trace blood.  Possible early kidney stone.  Her exam is reassuring.  Although she is complaining of flank pain her exam is normal. 3.  We will elect to send off the urine for culture.  I will also empirically treat her given her renal issues I do not want this to become a bigger problem for her. 4.  We will give her an educational handout on UTIs.  She needs to flush her system.  She could consider over-the-counter Azo to help assist with some of her symptoms.  She should drink plenty of water and cranberry juice. 5.  Tylenol or Motrin for fever or discomfort although she probably should stay away from the NSAIDs and stick just with Tylenol given her renal issues. 6.  Follow-up with [place] as needed.    Final Clinical Impressions(s) / UC Diagnoses   Final diagnoses:  Dysuria  Urinary frequency  Abdominal pain, suprapubic     Discharge Instructions      Went ahead and got a UA.  It did show bacteria but no leukocytes or nitrites.  She also had trace blood.  Possible  early kidney stone.  Her exam is reassuring.  Although she is complaining of flank pain her exam is normal. We will elect to send off the urine for culture.  I will also empirically treat her given her renal issues I do not want this to become a bigger problem for her. We will give her an educational handout on UTIs.  She needs to flush her system.  She could consider over-the-counter Azo to help assist with some of her symptoms.  She should drink plenty of water and cranberry juice. Tylenol or Motrin for fever or discomfort although she probably should stay away from the NSAIDs and stick just with Tylenol given her renal issues. Follow-up with [place] as needed.    ED Prescriptions    Medication Sig Dispense Auth. Provider   nitrofurantoin, macrocrystal-monohydrate, (MACROBID) 100 MG capsule Take 1 capsule (100 mg total) by mouth 2 (two) times daily. 10 capsule Verda Cumins, MD     PDMP not reviewed this encounter.   Verda Cumins, MD 06/03/20 1434

## 2020-06-03 NOTE — ED Triage Notes (Signed)
Pt with last few days of right flank pain radiating into right groin. Endorses burning with urination.

## 2020-06-04 ENCOUNTER — Ambulatory Visit: Payer: Self-pay | Admitting: Pharmacist

## 2020-06-04 DIAGNOSIS — E1140 Type 2 diabetes mellitus with diabetic neuropathy, unspecified: Secondary | ICD-10-CM

## 2020-06-04 DIAGNOSIS — N1831 Chronic kidney disease, stage 3a: Secondary | ICD-10-CM

## 2020-06-04 DIAGNOSIS — I129 Hypertensive chronic kidney disease with stage 1 through stage 4 chronic kidney disease, or unspecified chronic kidney disease: Secondary | ICD-10-CM

## 2020-06-04 DIAGNOSIS — E1122 Type 2 diabetes mellitus with diabetic chronic kidney disease: Secondary | ICD-10-CM

## 2020-06-04 NOTE — Chronic Care Management (AMB) (Signed)
Chronic Care Management Pharmacy  Name: Mary Parks  MRN: [id number] DOB: 11/08/1950   Chief Complaint/ HPI  Mary Parks,  70 y.o. , female presents for their Follow-Up CCM visit with the clinical pharmacist via telephone.  PCP : Venita Lick, NP Patient Care Team: Venita Lick, NP as PCP - General (Nurse Practitioner) Kshirsagar, Thomasena Edis, MD as Referring Physician (Nephrology) Vladimir Faster, [facility] as Pharmacist (Pharmacist)  Their chronic conditions include: Hypertension, Hyperlipidemia, Diabetes,? Asthma, Chronic Kidney Disease, Hypothyroidism s/p total thyroidectomy  and Allergic Rhinitis   Office Visits: 04/30/20- [name], NP-  Sinusitisi,gastroenteritis follow up- COVID negative- Augmentin 875mg  bid x7d 04/09/20 - [name], NP- bloodwork- Zofran 01/11/20- [name], NP- blood work  Consult Visits: 06/03/20- [facility] [place]- Dysuria- Macrobid 100 mg bid 04/05/20- [facility]- syncope and collapse, N?V    Allergies  Allergen Reactions  . Celebrex [Celecoxib]     hallucinations  . Sulfa Antibiotics   . Sulfisoxazole Rash  . Sulfur Itching    Medications: Outpatient Encounter Medications as of 06/04/2020  Medication Sig  . albuterol (VENTOLIN HFA) 108 (90 Base) MCG/ACT inhaler Inhale 2 puffs into the lungs every 6 (six) hours as needed for wheezing or shortness of breath.  Marland Kitchen amLODipine (NORVASC) 2.5 MG tablet Take 2.5 mg by mouth daily.  Marland Kitchen aspirin EC 81 MG tablet Take 81 mg by mouth daily.  Marland Kitchen atorvastatin (LIPITOR) 40 MG tablet TAKE 1 TABLET BY MOUTH AT BEDTIME  . budesonide-formoterol (SYMBICORT) 160-4.5 MCG/ACT inhaler Inhale 2 puffs into the lungs 2 (two) times daily.  . calcium-vitamin D (OSCAL WITH D) 500-200 MG-UNIT tablet Take 1 tablet by mouth.  . dexlansoprazole (DEXILANT) 60 MG capsule Take 60 mg by mouth daily.  . fluticasone (FLONASE) 50 MCG/ACT nasal spray USE 2 SPRAY(S) IN EACH NOSTRIL ONCE DAILY  . insulin lispro  (HUMALOG) 100 UNIT/ML KwikPen INJECT 8 UNITS SUBCTUANEOUSLY BEFORE BREAKFAST, 6 UNITS BEFORE LUNCH, AND 6 UNITS BEFORE EVENING MEAL.  Marland Kitchen LANTUS SOLOSTAR 100 UNIT/ML Solostar Pen INJECT 40 UNITS SUBCUTANEOUSLY TWICE DAILY  . levothyroxine (SYNTHROID) 88 MCG tablet Take 88 mcg by mouth every morning.  Marland Kitchen lisinopril (ZESTRIL) 20 MG tablet Take 1 tablet by mouth once daily  . meclizine (ANTIVERT) 12.5 MG tablet Take 1 tablet (12.5 mg total) by mouth 3 (three) times daily as needed for dizziness.  . metoprolol tartrate (LOPRESSOR) 25 MG tablet Take 25 mg by mouth 2 (two) times daily.  . montelukast (SINGULAIR) 10 MG tablet Take 1 tablet (10 mg total) by mouth at bedtime.  . nitrofurantoin, macrocrystal-monohydrate, (MACROBID) 100 MG capsule Take 1 capsule (100 mg total) by mouth 2 (two) times daily.  . ondansetron (ZOFRAN) 4 MG tablet Take 1 tablet (4 mg total) by mouth every 8 (eight) hours as needed for nausea or vomiting.  . ONE TOUCH ULTRA TEST test strip 1 each by Other route 2 (two) times daily.   [name] DELICA LANCETS 71I MISC 2 (two) times daily.   . TRULICITY 1.5 WP/8.0DX SOPN INJECT 1.5MG  INTO THE SKIN ONCE A WEEK   No facility-administered encounter medications on file as of 06/04/2020.    Wt Readings from Last 3 Encounters:  06/03/20 183 lb (83 kg)  04/30/20 179 lb 6.4 oz (81.4 kg)  04/09/20 179 lb 12.8 oz (81.6 kg)    Current Diagnosis/Assessment:    Goals Addressed   None     Diabetes   A1c goal <7%  Recent Relevant Labs: Lab  Results  Component Value Date/Time   HGBA1C 8.7 (H) 04/06/2020 01:33 AM   HGBA1C 9.3 01/02/2020 12:00 AM   HGBA1C 8.6 (H) 10/03/2019 10:41 AM   HGBA1C 8.4 (H) 11/29/2018 10:28 AM   HGBA1C 9.7 01/05/2016 12:00 AM   MICROALBUR 150 (H) 10/03/2019 10:41 AM   MICROALBUR 150 (H) 11/29/2018 10:28 AM    Last diabetic Eye exam:  Lab Results  Component Value Date/Time   HMDIABEYEEXA No Retinopathy 07/18/2019 12:00 AM    Last diabetic Foot exam:   Lab Results  Component Value Date/Time   HMDIABFOOTEX bilateral- normal 06/28/2016 12:00 AM     Checking BG: 2x per Day  Recent FBG Readings: 115 Recent pre-meal BG readings: 210 (before supper)   Patient has failed these meds in past: NA Patient is currently uncontrolled on the following medications: . Lispro 8,6,6 units with meals . Lantus 40 units bid . Trulicity 1.5 mg weekly on Mondays  We discussed: diet and exercise extensively and how to recognize and treat signs of hypoglycemia. Patient walks with a friend group weather permitting. She denies any missed doses or symptoms of hypoglycemia. Patient follows with Dr. Rosario Jacks endocrinology. He has ordered Dexcom for patient but she has not received.  Encouraged her to discuss addition of Farxiga and increased dose of Trulicity at next visit. Reviewed goal glucose readings for an A1c of <7%, we want to see fasting sugars <130 and 2 hour after meal sugars <180.   Plan  Continue current medications  Hypertension/CKD III   BP goal is:  <130/80  Office blood pressures are  BP Readings from Last 3 Encounters:  06/03/20 139/74  04/30/20 127/76  04/09/20 115/73   BMP Latest Ref Rng & Units 04/09/2020 04/06/2020 04/05/2020  Glucose 65 - 99 mg/dL 86 115(H) 185(H)  BUN 8 - 27 mg/dL $Remove'8 15 20  'bYxohYR$ Creatinine 0.57 - 1.00 mg/dL 1.04(H) 1.14(H) 1.34(H)  BUN/Creat Ratio 12 - 28 8(L) - -  Sodium 134 - 144 mmol/L 143 136 136  Potassium 3.5 - 5.2 mmol/L 4.1 3.7 3.8  Chloride 96 - 106 mmol/L 109(H) 108 105  CO2 20 - 29 mmol/L 19(L) 22 22  Calcium 8.7 - 10.3 mg/dL 9.1 8.0(L) 8.6(L)  AYOK~59  Patient checks BP at home infrequently Patient home BP readings are ranging: Unknown  Patient has failed these meds in the past: NA Patient is currently controlled on the following medications:  . Lisinopril 20 mg qd . Metoprolol Tartrate 25 mg bid   . Amlodipine 2.5 mg qd  We discussed Diet and exercise. Low sodium food choices. Patient denies any  missed doses. Encouraged her to check BP regularly at home and reviewed proper technique.  Follows with Dr. Ardyth Man nephrology.   Plan  Continue current medications      Asthma/ Allergic Rhinitis   Last spirometry score: 2018  Gold Grade: Gold 2 (FEV1 50-79%)   Lab Results  Component Value Date/Time   EOSPCT 0 04/05/2020 10:00 PM   EOSABS 0.1 04/09/2020 01:58 PM    Patient has failed these meds in past: NA Patient is currently controlled on the following medications:  . Albuterol 2 puffs q6h prn . Symbicort 160/4.55mcg 2 puffs bid prn . Fluticasone Nasal Spray 2 sprays EN qd . Montelukast 10 mg qd   Using maintenance inhaler regularly? NO Frequency of rescue inhaler use:  1-2x per week  We discussed:  Proper inhaler technique including rinsing mouth after Symbicort. Patient reports her breathing has been well controlled and  she continues to use Symbicort prn instead of scheduled.  Plan  Continue current medications. Consider spirometry at next visit  Hypothyroidism   Lab Results  Component Value Date/Time   TSH 1.71 01/02/2020 12:00 AM   TSH 0.333 (L) 11/29/2018 10:31 AM   TSH 0.282 (L) 08/28/2018 01:11 PM    Patient has failed these meds in past: NA Patient is currently controlled on the following medications:  . Levothyroxine 88 mcg qam  We discussed:  Taking medication at the same time each am ~ 30 minutes prior to food. Follows with Dr. Rosario Jacks  Plan  Continue current medications   Medication Management   Pt uses [facility] for all medications Uses pill box? Yes Pt endorses 95% compliance    Plan  Continue current medication management strategy    Follow up: 2 month phone visit  Junita Push. Kenton Kingfisher PharmD, [facility] [phone]

## 2020-06-05 LAB — URINE CULTURE: Special Requests: NORMAL

## 2020-06-17 ENCOUNTER — Other Ambulatory Visit: Payer: Self-pay | Admitting: Nurse Practitioner

## 2020-06-20 DIAGNOSIS — E559 Vitamin D deficiency, unspecified: Secondary | ICD-10-CM | POA: Diagnosis not present

## 2020-06-20 DIAGNOSIS — K219 Gastro-esophageal reflux disease without esophagitis: Secondary | ICD-10-CM | POA: Diagnosis not present

## 2020-06-20 DIAGNOSIS — N289 Disorder of kidney and ureter, unspecified: Secondary | ICD-10-CM | POA: Diagnosis not present

## 2020-06-20 DIAGNOSIS — E039 Hypothyroidism, unspecified: Secondary | ICD-10-CM | POA: Diagnosis not present

## 2020-06-20 DIAGNOSIS — E1165 Type 2 diabetes mellitus with hyperglycemia: Secondary | ICD-10-CM | POA: Diagnosis not present

## 2020-06-20 DIAGNOSIS — I10 Essential (primary) hypertension: Secondary | ICD-10-CM | POA: Diagnosis not present

## 2020-06-20 DIAGNOSIS — M899 Disorder of bone, unspecified: Secondary | ICD-10-CM | POA: Diagnosis not present

## 2020-06-22 NOTE — Patient Instructions (Addendum)
Visit Information  It was a pleasure speaking with you today. Thank you for letting me be part of your clinical team. Please call with any questions or concerns.   Goals Addressed            This Visit's Progress   . Pharmacy Care Plan       CARE PLAN ENTRY (see longitudinal plan of care for additional care plan information)  Current Barriers:  . Chronic Disease Management support, education, and care coordination needs related to Hypertension, Hyperlipidemia, Diabetes, COPD, Chronic Kidney Disease, Osteoarthritis, and Allergic Rhinitis   Hypertension BP Readings from Last 3 Encounters:  04/09/20 115/73  04/06/20 125/65  01/11/20 111/69   . Pharmacist Clinical Goal(s): o Over the next 60 days, patient will work with PharmD and providers to maintain BP goal <130/80 . Current regimen:  . Lisinopril 20 mg qd . Metoprolol Tartrate 25 mg bid   . Amlodipine 2.5 mg qd . Interventions: o Reviewed proper BP technique  o Provided diet and exercise counseling. . Patient self care activities - Over the next 60 days, patient will: o Check BP daily, document, and provide at future appointments o Ensure daily salt intake < 2300 mg/day   Diabetes Lab Results  Component Value Date/Time   HGBA1C 8.7 (H) 04/06/2020 01:33 AM   HGBA1C 9.3 01/02/2020 12:00 AM   HGBA1C 8.6 (H) 10/03/2019 10:41 AM   HGBA1C 8.4 (H) 11/29/2018 10:28 AM   HGBA1C 9.7 01/05/2016 12:00 AM   . Pharmacist Clinical Goal(s): o Over the next 60 days, patient will work with PharmD and providers to achieve A1c goal <7% . Current regimen:  . Lispro 8,6,6 units with meals . Lantus 44 units bid . Trulicity 1.5 mg weekly on Mondays . Interventions: o Provided diet and exercise counseling. o Reviewed goal glucose readings for an A1c of <7%, we want to see fasting sugars <130 and 2 hour after meal sugars <180.  o Discussed addition of Farxiga for renal protection and increasing Trulicity dose. . Patient self care  activities - Over the next 60 days, patient will: o Check blood sugar twice daily, document, and provide at future appointments o Contact provider with any episodes of hypoglycemia    Medication management . Pharmacist Clinical Goal(s): o Over the next 60 days, patient will work with PharmD and providers to maintain optimal medication adherence . Current pharmacy: [facility] . Interventions o Comprehensive medication review performed. o Continue current medication management strategy . Patient self care activities - Over the next 60 days, patient will: o Focus on medication adherence by fill dates o Take medications as prescribed o Report any questions or concerns to PharmD and/or provider(s)  Please see past updates related to this goal by clicking on the "Past Updates" button in the selected goal         The patient verbalized understanding of instructions, educational materials, and care plan provided today and agreed to receive a mailed copy of patient instructions, educational materials, and care plan.   Telephone follow up appointment with pharmacy team member scheduled for: 2 months  Junita Push. Mathan Darroch PharmD, BCPS Clinical Pharmacist [phone]  Managing Your Hypertension Hypertension, also called high blood pressure, is when the force of the blood pressing against the walls of the arteries is too strong. Arteries are blood vessels that carry blood from your heart throughout your body. Hypertension forces the heart to work harder to pump blood and may cause the arteries to become narrow or stiff. Understanding  blood pressure readings Your personal target blood pressure may vary depending on your medical conditions, your age, and other factors. A blood pressure reading includes a higher number over a lower number. Ideally, your blood pressure should be below 120/80. You should know that:  The first, or top, number is called the systolic pressure. It is a measure of the  pressure in your arteries as your heart beats.  The second, or bottom number, is called the diastolic pressure. It is a measure of the pressure in your arteries as the heart relaxes. Blood pressure is classified into four stages. Based on your blood pressure reading, your health care provider may use the following stages to determine what type of treatment you need, if any. Systolic pressure and diastolic pressure are measured in a unit called mmHg. Normal  Systolic pressure: below 123456.  Diastolic pressure: below 80. Elevated  Systolic pressure: [id number].  Diastolic pressure: below 80. Hypertension stage 1  Systolic pressure: 0000000.  Diastolic pressure: [id number]. Hypertension stage 2  Systolic pressure: [id number] or above.  Diastolic pressure: 90 or above. How can this condition affect me? Managing your hypertension is an important responsibility. Over time, hypertension can damage the arteries and decrease blood flow to important parts of the body, including the brain, heart, and kidneys. Having untreated or uncontrolled hypertension can lead to:  A heart attack.  A stroke.  A weakened blood vessel (aneurysm).  Heart failure.  Kidney damage.  Eye damage.  Metabolic syndrome.  Memory and concentration problems.  Vascular dementia. What actions can I take to manage this condition? Hypertension can be managed by making lifestyle changes and possibly by taking medicines. Your health care provider will help you make a plan to bring your blood pressure within a normal range. Nutrition  Eat a diet that is high in fiber and potassium, and low in salt (sodium), added sugar, and fat. An example eating plan is called the Dietary Approaches to Stop Hypertension (DASH) diet. To eat this way: ? Eat plenty of fresh fruits and vegetables. Try to fill one-half of your plate at each meal with fruits and vegetables. ? Eat whole grains, such as whole-wheat pasta, brown rice, or whole-grain  bread. Fill about one-fourth of your plate with whole grains. ? Eat low-fat dairy products. ? Avoid fatty cuts of meat, processed or cured meats, and poultry with skin. Fill about one-fourth of your plate with lean proteins such as fish, chicken without skin, beans, eggs, and tofu. ? Avoid pre-made and processed foods. These tend to be higher in sodium, added sugar, and fat.  Reduce your daily sodium intake. Most people with hypertension should eat less than 1,500 mg of sodium a day.   Lifestyle  Work with your health care provider to maintain a healthy body weight or to lose weight. Ask what an ideal weight is for you.  Get at least 30 minutes of exercise that causes your heart to beat faster (aerobic exercise) most days of the week. Activities may include walking, swimming, or biking.  Include exercise to strengthen your muscles (resistance exercise), such as weight lifting, as part of your weekly exercise routine. Try to do these types of exercises for 30 minutes at least 3 days a week.  Do not use any products that contain nicotine or tobacco, such as cigarettes, e-cigarettes, and chewing tobacco. If you need help quitting, ask your health care provider.  Control any long-term (chronic) conditions you have, such as high cholesterol or diabetes.  Identify  your sources of stress and find ways to manage stress. This may include meditation, deep breathing, or making time for fun activities.   Alcohol use  Do not drink alcohol if: ? Your health care provider tells you not to drink. ? You are pregnant, may be pregnant, or are planning to become pregnant.  If you drink alcohol: ? Limit how much you use to:  0-1 drink a day for women.  0-2 drinks a day for men. ? Be aware of how much alcohol is in your drink. In the [place] one drink equals one 12 oz bottle of beer (355 mL), one 5 oz glass of wine (148 mL), or one 1 oz glass of hard liquor (44 mL). Medicines Your health care provider may  prescribe medicine if lifestyle changes are not enough to get your blood pressure under control and if:  Your systolic blood pressure is 130 or higher.  Your diastolic blood pressure is 80 or higher. Take medicines only as told by your health care provider. Follow the directions carefully. Blood pressure medicines must be taken as told by your health care provider. The medicine does not work as well when you skip doses. Skipping doses also puts you at risk for problems. Monitoring Before you monitor your blood pressure:  Do not smoke, drink caffeinated beverages, or exercise within 30 minutes before taking a measurement.  Use the bathroom and empty your bladder (urinate).  Sit quietly for at least 5 minutes before taking measurements. Monitor your blood pressure at home as told by your health care provider. To do this:  Sit with your back straight and supported.  Place your feet flat on the floor. Do not cross your legs.  Support your arm on a flat surface, such as a table. Make sure your upper arm is at heart level.  Each time you measure, take two or three readings one minute apart and record the results. You may also need to have your blood pressure checked regularly by your health care provider.   General information  Talk with your health care provider about your diet, exercise habits, and other lifestyle factors that may be contributing to hypertension.  Review all the medicines you take with your health care provider because there may be side effects or interactions.  Keep all visits as told by your health care provider. Your health care provider can help you create and adjust your plan for managing your high blood pressure. Where to find more information  National Heart, Lung, and Blood Institute: https://wilson-eaton.com/  American Heart Association: www.heart.org Contact a health care provider if:  You think you are having a reaction to medicines you have taken.  You have  repeated (recurrent) headaches.  You feel dizzy.  You have swelling in your ankles.  You have trouble with your vision. Get help right away if:  You develop a severe headache or confusion.  You have unusual weakness or numbness, or you feel faint.  You have severe pain in your chest or abdomen.  You vomit repeatedly.  You have trouble breathing. These symptoms may represent a serious problem that is an emergency. Do not wait to see if the symptoms will go away. Get medical help right away. Call your local emergency services (911 in the [place] Do not drive yourself to the hospital. Summary  Hypertension is when the force of blood pumping through your arteries is too strong. If this condition is not controlled, it may put you at risk for serious  complications.  Your personal target blood pressure may vary depending on your medical conditions, your age, and other factors. For most people, a normal blood pressure is less than 120/80.  Hypertension is managed by lifestyle changes, medicines, or both.  Lifestyle changes to help manage hypertension include losing weight, eating a healthy, low-sodium diet, exercising more, stopping smoking, and limiting alcohol. This information is not intended to replace advice given to you by your health care provider. Make sure you discuss any questions you have with your health care provider. Document Revised: 06/15/2019 Document Reviewed: 04/10/2019 Elsevier Patient Education  2021 [employer].

## 2020-07-02 DIAGNOSIS — I10 Essential (primary) hypertension: Secondary | ICD-10-CM | POA: Diagnosis not present

## 2020-07-02 DIAGNOSIS — E559 Vitamin D deficiency, unspecified: Secondary | ICD-10-CM | POA: Diagnosis not present

## 2020-07-02 DIAGNOSIS — E039 Hypothyroidism, unspecified: Secondary | ICD-10-CM | POA: Diagnosis not present

## 2020-07-02 DIAGNOSIS — N289 Disorder of kidney and ureter, unspecified: Secondary | ICD-10-CM | POA: Diagnosis not present

## 2020-07-02 DIAGNOSIS — E1165 Type 2 diabetes mellitus with hyperglycemia: Secondary | ICD-10-CM | POA: Diagnosis not present

## 2020-07-03 ENCOUNTER — Other Ambulatory Visit: Payer: Self-pay | Admitting: Nurse Practitioner

## 2020-07-05 DIAGNOSIS — Z794 Long term (current) use of insulin: Secondary | ICD-10-CM | POA: Diagnosis not present

## 2020-07-05 DIAGNOSIS — E1165 Type 2 diabetes mellitus with hyperglycemia: Secondary | ICD-10-CM | POA: Diagnosis not present

## 2020-07-09 DIAGNOSIS — E1165 Type 2 diabetes mellitus with hyperglycemia: Secondary | ICD-10-CM | POA: Diagnosis not present

## 2020-07-09 DIAGNOSIS — Z794 Long term (current) use of insulin: Secondary | ICD-10-CM | POA: Diagnosis not present

## 2020-07-16 ENCOUNTER — Ambulatory Visit (INDEPENDENT_AMBULATORY_CARE_PROVIDER_SITE_OTHER): Payer: Medicare Other | Admitting: Nurse Practitioner

## 2020-07-16 ENCOUNTER — Encounter: Payer: Self-pay | Admitting: Nurse Practitioner

## 2020-07-16 ENCOUNTER — Other Ambulatory Visit: Payer: Self-pay

## 2020-07-16 VITALS — BP 120/75 | HR 77 | Temp 98.7°F | Wt 178.0 lb

## 2020-07-16 DIAGNOSIS — J410 Simple chronic bronchitis: Secondary | ICD-10-CM | POA: Diagnosis not present

## 2020-07-16 DIAGNOSIS — E785 Hyperlipidemia, unspecified: Secondary | ICD-10-CM | POA: Diagnosis not present

## 2020-07-16 DIAGNOSIS — E6609 Other obesity due to excess calories: Secondary | ICD-10-CM

## 2020-07-16 DIAGNOSIS — R1013 Epigastric pain: Secondary | ICD-10-CM

## 2020-07-16 DIAGNOSIS — Z6831 Body mass index (BMI) 31.0-31.9, adult: Secondary | ICD-10-CM

## 2020-07-16 DIAGNOSIS — I129 Hypertensive chronic kidney disease with stage 1 through stage 4 chronic kidney disease, or unspecified chronic kidney disease: Secondary | ICD-10-CM

## 2020-07-16 DIAGNOSIS — Z794 Long term (current) use of insulin: Secondary | ICD-10-CM | POA: Diagnosis not present

## 2020-07-16 DIAGNOSIS — E1122 Type 2 diabetes mellitus with diabetic chronic kidney disease: Secondary | ICD-10-CM

## 2020-07-16 DIAGNOSIS — E1169 Type 2 diabetes mellitus with other specified complication: Secondary | ICD-10-CM

## 2020-07-16 DIAGNOSIS — E0822 Diabetes mellitus due to underlying condition with diabetic chronic kidney disease: Secondary | ICD-10-CM

## 2020-07-16 DIAGNOSIS — K5901 Slow transit constipation: Secondary | ICD-10-CM | POA: Insufficient documentation

## 2020-07-16 DIAGNOSIS — R109 Unspecified abdominal pain: Secondary | ICD-10-CM | POA: Insufficient documentation

## 2020-07-16 DIAGNOSIS — E890 Postprocedural hypothyroidism: Secondary | ICD-10-CM

## 2020-07-16 DIAGNOSIS — E1140 Type 2 diabetes mellitus with diabetic neuropathy, unspecified: Secondary | ICD-10-CM | POA: Diagnosis not present

## 2020-07-16 DIAGNOSIS — N1831 Diabetes mellitus due to underlying condition with diabetic chronic kidney disease: Secondary | ICD-10-CM

## 2020-07-16 DIAGNOSIS — K5900 Constipation, unspecified: Secondary | ICD-10-CM | POA: Insufficient documentation

## 2020-07-16 LAB — MICROALBUMIN, URINE WAIVED
Creatinine, Urine Waived: 300 mg/dL (ref 10–300)
Microalb, Ur Waived: 150 mg/L — ABNORMAL HIGH (ref 0–19)

## 2020-07-16 MED ORDER — OMRON 3 SERIES BP MONITOR DEVI: 0 refills | Status: DC

## 2020-07-16 NOTE — Assessment & Plan Note (Signed)
Chronic, stable with minimal use of Albuterol.  Consider spirometry in future.

## 2020-07-16 NOTE — Assessment & Plan Note (Signed)
Chronic, ongoing.  Continue collaboration with [facility] nephrology.  Lisinopril for kidney protection. CMP today.

## 2020-07-16 NOTE — Progress Notes (Addendum)
BP 120/75 (BP Location: Left Arm, Cuff Size: Large)   Pulse 77   Temp 98.7 F (37.1 C) (Oral)   Wt 178 lb (80.7 kg)   LMP  (LMP Unknown)   SpO2 97%   BMI 30.08 kg/m    Subjective:    Patient ID: Mary Parks, female    DOB: 14-Nov-1950, 70 y.o.   MRN: [id number]  HPI: Mary Parks is a 70 y.o. female  Chief Complaint  Patient presents with  . Follow-up    Pt states she would like have her side and stomach checked at today's visit due to everything she eats causes gas and gas moves all around.   DIABETES Followed by Dr. Rosario Jacks, saw him 06/20/20.  A1C was 8.7% in November last, not checked with endo recently. Currently taking Trulicity and Lantus (40 units twice daily) and Humalog 10 in morning, 8 lunch, and 6 in evening. Review of chart over past year A1C has remained in 8-9 range.  Hypoglycemic episodes: none Polydipsia/polyuria: no Visual disturbance: no Chest pain: no Paresthesias: no Glucose Monitoring: yes             Accucheck frequency: BID             Fasting glucose: 120 to 130              Post prandial:             Evening: 200 range             Before meals: Taking Insulin?: yes             Long acting insulin: 40 units Lantus BID             Short acting insulin: Humalog 10 in morning, 8 lunch, and 6 in evening Blood Pressure Monitoring: a few times a week Retinal Examination: Not Up To Date Foot Exam: Up to Date Pneumovax: Up to Date Influenza: Up to Date Aspirin: yes  HYPOTHYROIDISM Followed by endocrinology, taking 88 MCG Levothyroxine.  Last TSH 1.710, Free T4 1.38.  She reports he did check thyroid labs and no changes made.  Unable to view note in Epic. Thyroid control status:stable Satisfied with current treatment? yes Medication side effects: no Medication compliance: good compliance Etiology of hypothyroidism:  Recent dose adjustment:no Fatigue: no Cold intolerance: no Heat intolerance: no Weight gain: no Weight loss:  no Constipation: no Diarrhea/loose stools: no Palpitations: no Lower extremity edema: no Anxiety/depressed mood: no   CHRONIC KIDNEY DISEASE Goes to nephrology.  Continues on Lisinopril. November 2021 labs CRT 1.04 and GFR 54. CKD status: stable Medications renally dose: yes Previous renal evaluation: yes Pneumovax:  Up to Date Influenza Vaccine:  Up to Date  HYPERTENSION / HYPERLIPIDEMIA Current medications include Metoprolol, Lisinopril, and Amlodipine for HTN + Lipitor for HLD.  Sees nephrology at [facility]. Satisfied with current treatment? yes Duration of hypertension: chronic BP monitoring frequency: not checking BP range:  BP medication side effects: no Duration of hyperlipidemia: chronic Cholesterol medication side effects: no Cholesterol supplements: none Medication compliance: good compliance Aspirin: yes Recent stressors: no Recurrent headaches: no Visual changes: no Palpitations: no Dyspnea: no Chest pain: no Lower extremity edema: no Dizzy/lightheaded: no   COPD Symbicort daily and Albuterol only as needed.  Non smoker. COPD status: stable Satisfied with current treatment?: yes Oxygen use: no Dyspnea frequency: none Cough frequency: none Rescue inhaler frequency:  none Limitation of activity: no Productive cough: none Last Spirometry: unknown Pneumovax: Up  to Date Influenza: Up to Date  ABDOMINAL PAIN  Has been ongoing on and off since gastritis in November.  Has a bowel movement once a day --does have to strain a little with this.  On stool chart reports bowels as Type 5 -- does take Miralax, but not every day.  Reports she started taking Prilosec recently which has helped. Duration:months Onset: gradual Severity: 8/10 Quality: dull, aching and burning Location:  diffuse  Episode duration:  Radiation: no Frequency: intermittent Alleviating factors: Omeprazole -- just started back on -- Dexilant did not help Aggravating factors: citrus  drinks Status: stable Treatments attempted: none Fever: no Nausea: occasional Vomiting: no Weight loss: 5 pounds Decreased appetite: a little Diarrhea: no Constipation: yes Blood in stool: no Heartburn: yes Jaundice: no Rash: no Dysuria/urinary frequency: no Hematuria: no History of sexually transmitted disease: no Recurrent NSAID use: no   Relevant past medical, surgical, family and social history reviewed and updated as indicated. Interim medical history since our last visit reviewed. Allergies and medications reviewed and updated.  Review of Systems  Constitutional: Negative for activity change, appetite change, diaphoresis, fatigue and fever.  Respiratory: Negative for cough, chest tightness and shortness of breath.   Cardiovascular: Negative for chest pain, palpitations and leg swelling.  Gastrointestinal: Negative.   Endocrine: Negative for cold intolerance, heat intolerance, polydipsia, polyphagia and polyuria.  Neurological: Negative.   Psychiatric/Behavioral: Negative.     Per HPI unless specifically indicated above     Objective:    BP 120/75 (BP Location: Left Arm, Cuff Size: Large)   Pulse 77   Temp 98.7 F (37.1 C) (Oral)   Wt 178 lb (80.7 kg)   LMP  (LMP Unknown)   SpO2 97%   BMI 30.08 kg/m   Wt Readings from Last 3 Encounters:  07/16/20 178 lb (80.7 kg)  06/03/20 183 lb (83 kg)  04/30/20 179 lb 6.4 oz (81.4 kg)    Physical Exam Vitals and nursing note reviewed.  Constitutional:      General: She is awake. She is not in acute distress.    Appearance: She is well-developed and well-groomed. She is obese. She is not ill-appearing.  HENT:     Head: Normocephalic.     Right Ear: Hearing and external ear normal. No drainage.     Left Ear: Hearing and external ear normal. No drainage.  Eyes:     General: Lids are normal.        Right eye: No discharge.        Left eye: No discharge.     Conjunctiva/sclera: Conjunctivae normal.     Pupils:  Pupils are equal, round, and reactive to light.  Neck:     Vascular: No carotid bruit.  Cardiovascular:     Rate and Rhythm: Normal rate and regular rhythm.     Heart sounds: Normal heart sounds.  Pulmonary:     Effort: Pulmonary effort is normal. No accessory muscle usage or respiratory distress.     Breath sounds: Normal breath sounds.  Abdominal:     General: Bowel sounds are normal. There is no distension or abdominal bruit.     Palpations: Abdomen is soft.     Tenderness: There is abdominal tenderness in the epigastric area. There is no right CVA tenderness, left CVA tenderness, guarding or rebound. Negative signs include Murphy's sign.     Hernia: A hernia is present. Hernia is present in the umbilical area.  Musculoskeletal:     Cervical back: Normal  range of motion and neck supple.     Right lower leg: No edema.     Left lower leg: No edema.  Lymphadenopathy:     Cervical: No cervical adenopathy.  Skin:    General: Skin is warm and dry.  Neurological:     Mental Status: She is alert and oriented to person, place, and time.     Cranial Nerves: Cranial nerves are intact.     Motor: Motor function is intact.     Coordination: Coordination is intact.     Gait: Gait is intact.     Deep Tendon Reflexes:     Reflex Scores:      Brachioradialis reflexes are 2+ on the right side and 2+ on the left side.      Patellar reflexes are 2+ on the right side and 2+ on the left side. Psychiatric:        Attention and Perception: Attention normal.        Mood and Affect: Mood normal.        Speech: Speech normal.        Behavior: Behavior normal. Behavior is cooperative.        Thought Content: Thought content normal.    Diabetic Foot Exam - Simple   Simple Foot Form Visual Inspection No deformities, no ulcerations, no other skin breakdown bilaterally: Yes Sensation Testing Intact to touch and monofilament testing bilaterally: Yes Pulse Check Posterior Tibialis and Dorsalis pulse  intact bilaterally: Yes Comments     Results for orders placed or performed during the hospital encounter of 06/03/20  Urine culture   Specimen: Urine, Clean Catch  Result Value Ref Range   Specimen Description      URINE, CLEAN CATCH Performed at [facility], [address] [place], [place] [address]    Special Requests      Normal Performed at [facility], [address], [place], [place] [address]    Culture MULTIPLE SPECIES PRESENT, SUGGEST RECOLLECTION (A)    Report Status 06/05/2020 FINAL   Urinalysis, Complete w Microscopic Urine, Clean Catch  Result Value Ref Range   Color, Urine YELLOW YELLOW   APPearance CLEAR CLEAR   Specific Gravity, Urine 1.025 1.005 - 1.030   pH 5.0 5.0 - 8.0   Glucose, UA NEGATIVE NEGATIVE mg/dL   Hgb urine dipstick TRACE (A) NEGATIVE   Bilirubin Urine NEGATIVE NEGATIVE   Ketones, ur NEGATIVE NEGATIVE mg/dL   Protein, ur 100 (A) NEGATIVE mg/dL   Nitrite NEGATIVE NEGATIVE   Leukocytes,Ua NEGATIVE NEGATIVE   Squamous Epithelial / LPF 6-10 0 - 5   WBC, UA 0-5 0 - 5 WBC/hpf   RBC / HPF 0-5 0 - 5 RBC/hpf   Bacteria, UA FEW (A) NONE SEEN      Assessment & Plan:   Problem List Items Addressed This Visit      Cardiovascular and Mediastinum   Hypertension 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  Continue current medication regimen, Lisinopril for kidney protection with diabetes and collaboration with [facility] nephrology.  Recommend she monitor BP at home at least 3 mornings a week.  Obtain CMP and urine ALB today.  Focus on DASH diet at home.  Return in 3 months.      Relevant Orders   CBC with Differential/Platelet   Comprehensive metabolic panel   Microalbumin, Urine Waived   HgB A1c  Respiratory   COPD (chronic obstructive pulmonary disease) (HCC)    Chronic, stable with minimal use of Albuterol.  Consider spirometry in future.         Endocrine   Diabetes mellitus with chronic kidney disease (HCC)    Chronic, ongoing.  A1C 8.7% in hospital November, recommend focus on diet.  Urine ALB 150 and A:C 30-300 last visit, recheck today.  Continue current medication regimen and collaboration with endocrinology and nephrology. CMP today, continue Lisinopril for kidney protection.  Return in 6 months.  Will have staff assist with Dexcom placement.      Type 2 diabetes mellitus with diabetic neuropathy, with long-term current use of insulin (HCC) - Primary    Chronic, ongoing.  A1C with recent 8.7%, recheck today.  Continue current medication regimen and collaboration with endocrinology.  CMP today, continue Lisinopril for kidney protection.  Recommend patient continue to monitor BS at home.  Return in 3 months.      Relevant Orders   HgB A1c   Hyperlipidemia associated with type 2 diabetes mellitus (HCC)    Chronic, ongoing.  Continue current medication regimen and adjust as needed.  Lipid panel today.      Relevant Orders   Lipid Panel w/o Chol/HDL Ratio   HgB A1c   Hypothyroidism    S/P thyroidectomy years ago.  Followed by Dr. Rosario Jacks with endo.  Continue current medication regimen and adjust as needed.  Check TSH and Free T4 today due to ongoing constipation issues.       Relevant Orders   T4, free   TSH     Genitourinary   Chronic kidney disease, stage 3a (HCC)    Chronic, ongoing.  Continue collaboration with [facility] nephrology.  Lisinopril for kidney protection. CMP today.        Other   Obesity    Recommended eating smaller high protein, low fat meals more frequently and exercising 30 mins a day 5 times a week with a goal of 10-15lb weight loss in the next 3 months. Patient voiced their understanding and motivation to adhere to these recommendations.       Abdominal pain    Ongoing with some improvement with Prilosec, continue this daily at this time.  She does endorse constipation as well -- recommend  clearance with Mag Citrate until loose stool present then routinely take Miralax daily, no missed doses.  Will check labs today and plan for follow-up in 4 weeks, sooner if worsening.  If ongoing consider imaging.  No current red flag symptoms.          Follow up plan: Return in about 4 weeks (around 08/13/2020) for Abdominal Pain.

## 2020-07-16 NOTE — Assessment & Plan Note (Signed)
Chronic, ongoing.  A1C with recent 8.7%, recheck today.  Continue current medication regimen and collaboration with endocrinology.  CMP today, continue Lisinopril for kidney protection.  Recommend patient continue to monitor BS at home.  Return in 3 months.

## 2020-07-16 NOTE — Assessment & Plan Note (Signed)
Recommended eating smaller high protein, low fat meals more frequently and exercising 30 mins a day 5 times a week with a goal of 10-15lb weight loss in the next 3 months. Patient voiced their understanding and motivation to adhere to these recommendations.  

## 2020-07-16 NOTE — Assessment & Plan Note (Signed)
Chronic, ongoing.  A1C 8.7% in hospital November, recommend focus on diet.  Urine ALB 150 and A:C 30-300 last visit, recheck today.  Continue current medication regimen and collaboration with endocrinology and nephrology. CMP today, continue Lisinopril for kidney protection.  Return in 6 months.  Will have staff assist with Dexcom placement.

## 2020-07-16 NOTE — Assessment & Plan Note (Signed)
S/P thyroidectomy years ago.  Followed by Dr. Rosario Jacks with endo.  Continue current medication regimen and adjust as needed.  Check TSH and Free T4 today due to ongoing constipation issues.

## 2020-07-16 NOTE — Assessment & Plan Note (Signed)
Chronic, stable with BP at goal today.  Continue current medication regimen, Lisinopril for kidney protection with diabetes and collaboration with [facility] nephrology.  Recommend she monitor BP at home at least 3 mornings a week.  Obtain CMP and urine ALB today.  Focus on DASH diet at home.  Return in 3 months.

## 2020-07-16 NOTE — Patient Instructions (Signed)
Take Mag Citrate until having looser diarrhea type stool, then stop.  Then start taking Miralax daily.  B12 is good for energy.  Constipation, Adult Constipation is when a person has trouble pooping (having a bowel movement). When you have this condition, you may poop fewer than 3 times a week. Your poop (stool) may also be dry, hard, or bigger than normal. Follow these instructions at home: Eating and drinking  Eat foods that have a lot of fiber, such as: ? Fresh fruits and vegetables. ? Whole grains. ? Beans.  Eat less of foods that are low in fiber and high in fat and sugar, such as: ? [place] fries. ? Hamburgers. ? Cookies. ? Candy. ? Soda.  Drink enough fluid to keep your pee (urine) pale yellow.   General instructions  Exercise regularly or as told by your doctor. Try to do 150 minutes of exercise each week.  Go to the restroom when you feel like you need to poop. Do not hold it in.  Take over-the-counter and prescription medicines only as told by your doctor. These include any fiber supplements.  When you poop: ? Do deep breathing while relaxing your lower belly (abdomen). ? Relax your pelvic floor. The pelvic floor is a group of muscles that support the rectum, bladder, and intestines (as well as the uterus in women).  Watch your condition for any changes. Tell your doctor if you notice any.  Keep all follow-up visits as told by your doctor. This is important. Contact a doctor if:  You have pain that gets worse.  You have a fever.  You have not pooped for 4 days.  You vomit.  You are not hungry.  You lose weight.  You are bleeding from the opening of the butt (anus).  You have thin, pencil-like poop. Get help right away if:  You have a fever, and your symptoms suddenly get worse.  You leak poop or have blood in your poop.  Your belly feels hard or bigger than normal (bloated).  You have very bad belly pain.  You feel dizzy or you  faint. Summary  Constipation is when a person poops fewer than 3 times a week, has trouble pooping, or has poop that is dry, hard, or bigger than normal.  Eat foods that have a lot of fiber.  Drink enough fluid to keep your pee (urine) pale yellow.  Take over-the-counter and prescription medicines only as told by your doctor. These include any fiber supplements. This information is not intended to replace advice given to you by your health care provider. Make sure you discuss any questions you have with your health care provider. Document Revised: 03/28/2019 Document Reviewed: 03/28/2019 Elsevier Patient Education  [place].

## 2020-07-16 NOTE — Assessment & Plan Note (Signed)
Ongoing with some improvement with Prilosec, continue this daily at this time.  She does endorse constipation as well -- recommend clearance with Mag Citrate until loose stool present then routinely take Miralax daily, no missed doses.  Will check labs today and plan for follow-up in 4 weeks, sooner if worsening.  If ongoing consider imaging.  No current red flag symptoms.

## 2020-07-16 NOTE — Assessment & Plan Note (Signed)
Chronic, ongoing.  Continue current medication regimen and adjust as needed. Lipid panel today. 

## 2020-07-17 ENCOUNTER — Other Ambulatory Visit: Payer: Self-pay | Admitting: Nurse Practitioner

## 2020-07-17 LAB — COMPREHENSIVE METABOLIC PANEL WITH GFR
ALT: 17 [IU]/L (ref 0–32)
AST: 20 [IU]/L (ref 0–40)
Albumin/Globulin Ratio: 1.5 (ref 1.2–2.2)
Albumin: 4.1 g/dL (ref 3.7–4.7)
Alkaline Phosphatase: 111 [IU]/L (ref 44–121)
BUN/Creatinine Ratio: 14 (ref 12–28)
BUN: 17 mg/dL (ref 8–27)
Bilirubin Total: 0.4 mg/dL (ref 0.0–1.2)
CO2: 25 mmol/L (ref 20–29)
Calcium: 9.4 mg/dL (ref 8.7–10.3)
Chloride: 103 mmol/L (ref 96–106)
Creatinine, Ser: 1.25 mg/dL — ABNORMAL HIGH (ref 0.57–1.00)
GFR calc Af Amer: 50 mL/min/{1.73_m2} — ABNORMAL LOW
GFR calc non Af Amer: 43 mL/min/{1.73_m2} — ABNORMAL LOW
Globulin, Total: 2.8 g/dL (ref 1.5–4.5)
Glucose: 240 mg/dL — ABNORMAL HIGH (ref 65–99)
Potassium: 4.3 mmol/L (ref 3.5–5.2)
Sodium: 143 mmol/L (ref 134–144)
Total Protein: 6.9 g/dL (ref 6.0–8.5)

## 2020-07-17 LAB — CBC WITH DIFFERENTIAL/PLATELET
Basophils Absolute: 0 10*3/uL (ref 0.0–0.2)
Basos: 1 %
EOS (ABSOLUTE): 0.1 10*3/uL (ref 0.0–0.4)
Eos: 4 %
Hematocrit: 38.4 % (ref 34.0–46.6)
Hemoglobin: 12.4 g/dL (ref 11.1–15.9)
Immature Grans (Abs): 0 10*3/uL (ref 0.0–0.1)
Immature Granulocytes: 0 %
Lymphocytes Absolute: 1 10*3/uL (ref 0.7–3.1)
Lymphs: 30 %
MCH: 27 pg (ref 26.6–33.0)
MCHC: 32.3 g/dL (ref 31.5–35.7)
MCV: 84 fL (ref 79–97)
Monocytes Absolute: 0.2 10*3/uL (ref 0.1–0.9)
Monocytes: 7 %
Neutrophils Absolute: 1.9 10*3/uL (ref 1.4–7.0)
Neutrophils: 58 %
Platelets: 209 10*3/uL (ref 150–450)
RBC: 4.59 x10E6/uL (ref 3.77–5.28)
RDW: 13.8 % (ref 11.7–15.4)
WBC: 3.3 10*3/uL — ABNORMAL LOW (ref 3.4–10.8)

## 2020-07-17 LAB — LIPID PANEL W/O CHOL/HDL RATIO
Cholesterol, Total: 163 mg/dL (ref 100–199)
HDL: 41 mg/dL
LDL Chol Calc (NIH): 100 mg/dL — ABNORMAL HIGH (ref 0–99)
Triglycerides: 122 mg/dL (ref 0–149)
VLDL Cholesterol Cal: 22 mg/dL (ref 5–40)

## 2020-07-17 LAB — HEMOGLOBIN A1C
Est. average glucose Bld gHb Est-mCnc: 186 mg/dL
Hgb A1c MFr Bld: 8.1 % — ABNORMAL HIGH (ref 4.8–5.6)

## 2020-07-17 LAB — TSH: TSH: 1.84 u[IU]/mL (ref 0.450–4.500)

## 2020-07-17 LAB — T4, FREE: Free T4: 1.43 ng/dL (ref 0.82–1.77)

## 2020-07-17 MED ORDER — ATORVASTATIN CALCIUM 80 MG PO TABS: 80.0000 mg | ORAL_TABLET | Freq: Every day | ORAL | 3 refills | Status: DC

## 2020-07-17 NOTE — Progress Notes (Signed)
Please let Gift know her labs have returned: - White blood cell count continues to be mildly on low side at 3.3, but remainder of CBC is stable, will recheck next visit. - Kidney function shows some mild kidney disease still, but no decline.  Will continue to monitor closely. - A1c is 8.1% today -- ensure follow-up with endo, this is trending down some, but still above goal. - Thyroid labs normal. - Cholesterol labs show LDL continuing above goal, I would like to increase your Atorvastatin to 80 MG daily, would this be okay?  If so I will send this in and want you to stop current 40 MG dosing.  Any questions? Keep being awesome!!  Thank you for allowing me to participate in your care. Kindest regards, Janely Gullickson

## 2020-07-23 ENCOUNTER — Telehealth: Payer: Self-pay

## 2020-07-30 ENCOUNTER — Other Ambulatory Visit: Payer: Self-pay | Admitting: Nurse Practitioner

## 2020-08-11 ENCOUNTER — Other Ambulatory Visit: Payer: Self-pay | Admitting: Nurse Practitioner

## 2020-08-13 ENCOUNTER — Ambulatory Visit (INDEPENDENT_AMBULATORY_CARE_PROVIDER_SITE_OTHER): Payer: Medicare Other | Admitting: Nurse Practitioner

## 2020-08-13 ENCOUNTER — Other Ambulatory Visit: Payer: Self-pay

## 2020-08-13 ENCOUNTER — Encounter: Payer: Self-pay | Admitting: Nurse Practitioner

## 2020-08-13 VITALS — BP 123/72 | HR 77 | Temp 97.7°F | Wt 179.2 lb

## 2020-08-13 DIAGNOSIS — K219 Gastro-esophageal reflux disease without esophagitis: Secondary | ICD-10-CM | POA: Diagnosis not present

## 2020-08-13 DIAGNOSIS — E6609 Other obesity due to excess calories: Secondary | ICD-10-CM

## 2020-08-13 DIAGNOSIS — K5901 Slow transit constipation: Secondary | ICD-10-CM | POA: Diagnosis not present

## 2020-08-13 DIAGNOSIS — J0110 Acute frontal sinusitis, unspecified: Secondary | ICD-10-CM | POA: Diagnosis not present

## 2020-08-13 DIAGNOSIS — Z6830 Body mass index (BMI) 30.0-30.9, adult: Secondary | ICD-10-CM

## 2020-08-13 MED ORDER — AMOXICILLIN-POT CLAVULANATE 875-125 MG PO TABS: 1.0000 | ORAL_TABLET | Freq: Two times a day (BID) | ORAL | 0 refills | Status: AC

## 2020-08-13 NOTE — Assessment & Plan Note (Signed)
Acute x 7 days.  Script for Augmentin sent in and recommend to take with probiotic yogurt daily.  Will avoid Prednisone at this time due to T2DM and sugars.  Recommend OTC diabetic tussin for symptom relief + daily Claritin. Continue daily Flonase and Singulair due to allergic rhinitis.  Return to office for worsening or ongoing symptoms.

## 2020-08-13 NOTE — Patient Instructions (Signed)
Diabetes Mellitus and Nutrition, Adult When you have diabetes, or diabetes mellitus, it is very important to have healthy eating habits because your blood sugar (glucose) levels are greatly affected by what you eat and drink. Eating healthy foods in the right amounts, at about the same times every day, can help you:  Control your blood glucose.  Lower your risk of heart disease.  Improve your blood pressure.  Reach or maintain a healthy weight. What can affect my meal plan? Every person with diabetes is different, and each person has different needs for a meal plan. Your health care provider may recommend that you work with a dietitian to make a meal plan that is best for you. Your meal plan may vary depending on factors such as:  The calories you need.  The medicines you take.  Your weight.  Your blood glucose, blood pressure, and cholesterol levels.  Your activity level.  Other health conditions you have, such as heart or kidney disease. How do carbohydrates affect me? Carbohydrates, also called carbs, affect your blood glucose level more than any other type of food. Eating carbs naturally raises the amount of glucose in your blood. Carb counting is a method for keeping track of how many carbs you eat. Counting carbs is important to keep your blood glucose at a healthy level, especially if you use insulin or take certain oral diabetes medicines. It is important to know how many carbs you can safely have in each meal. This is different for every person. Your dietitian can help you calculate how many carbs you should have at each meal and for each snack. How does alcohol affect me? Alcohol can cause a sudden decrease in blood glucose (hypoglycemia), especially if you use insulin or take certain oral diabetes medicines. Hypoglycemia can be a life-threatening condition. Symptoms of hypoglycemia, such as sleepiness, dizziness, and confusion, are similar to symptoms of having too much  alcohol.  Do not drink alcohol if: ? Your health care provider tells you not to drink. ? You are pregnant, may be pregnant, or are planning to become pregnant.  If you drink alcohol: ? Do not drink on an empty stomach. ? Limit how much you use to:  0-1 drink a day for women.  0-2 drinks a day for men. ? Be aware of how much alcohol is in your drink. In the [place] one drink equals one 12 oz bottle of beer (355 mL), one 5 oz glass of wine (148 mL), or one 1 oz glass of hard liquor (44 mL). ? Keep yourself hydrated with water, diet soda, or unsweetened iced tea.  Keep in mind that regular soda, juice, and other mixers may contain a lot of sugar and must be counted as carbs. What are tips for following this plan? Reading food labels  Start by checking the serving size on the "Nutrition Facts" label of packaged foods and drinks. The amount of calories, carbs, fats, and other nutrients listed on the label is based on one serving of the item. Many items contain more than one serving per package.  Check the total grams (g) of carbs in one serving. You can calculate the number of servings of carbs in one serving by dividing the total carbs by 15. For example, if a food has 30 g of total carbs per serving, it would be equal to 2 servings of carbs.  Check the number of grams (g) of saturated fats and trans fats in one serving. Choose foods that have   a low amount or none of these fats.  Check the number of milligrams (mg) of salt (sodium) in one serving. Most people should limit total sodium intake to less than 2,300 mg per day.  Always check the nutrition information of foods labeled as "low-fat" or "nonfat." These foods may be higher in added sugar or refined carbs and should be avoided.  Talk to your dietitian to identify your daily goals for nutrients listed on the label. Shopping  Avoid buying canned, pre-made, or processed foods. These foods tend to be high in fat, sodium, and added  sugar.  Shop around the outside edge of the grocery store. This is where you will most often find fresh fruits and vegetables, bulk grains, fresh meats, and fresh dairy. Cooking  Use low-heat cooking methods, such as baking, instead of high-heat cooking methods like deep frying.  Cook using healthy oils, such as olive, canola, or sunflower oil.  Avoid cooking with butter, cream, or high-fat meats. Meal planning  Eat meals and snacks regularly, preferably at the same times every day. Avoid going long periods of time without eating.  Eat foods that are high in fiber, such as fresh fruits, vegetables, beans, and whole grains. Talk with your dietitian about how many servings of carbs you can eat at each meal.  Eat 4-6 oz (112-168 g) of lean protein each day, such as lean meat, chicken, fish, eggs, or tofu. One ounce (oz) of lean protein is equal to: ? 1 oz (28 g) of meat, chicken, or fish. ? 1 egg. ?  cup (62 g) of tofu.  Eat some foods each day that contain healthy fats, such as avocado, nuts, seeds, and fish.   What foods should I eat? Fruits Berries. Apples. Oranges. Peaches. Apricots. Plums. Grapes. Mango. Papaya. Pomegranate. Kiwi. Cherries. Vegetables Lettuce. Spinach. Leafy greens, including kale, chard, collard greens, and mustard greens. Beets. Cauliflower. Cabbage. Broccoli. Carrots. Green beans. Tomatoes. Peppers. Onions. Cucumbers. Brussels sprouts. Grains Whole grains, such as whole-wheat or whole-grain bread, crackers, tortillas, cereal, and pasta. Unsweetened oatmeal. Quinoa. Brown or wild rice. Meats and other proteins Seafood. Poultry without skin. Lean cuts of poultry and beef. Tofu. Nuts. Seeds. Dairy Low-fat or fat-free dairy products such as milk, yogurt, and cheese. The items listed above may not be a complete list of foods and beverages you can eat. Contact a dietitian for more information. What foods should I avoid? Fruits Fruits canned with  syrup. Vegetables Canned vegetables. Frozen vegetables with butter or cream sauce. Grains Refined white flour and flour products such as bread, pasta, snack foods, and cereals. Avoid all processed foods. Meats and other proteins Fatty cuts of meat. Poultry with skin. Breaded or fried meats. Processed meat. Avoid saturated fats. Dairy Full-fat yogurt, cheese, or milk. Beverages Sweetened drinks, such as soda or iced tea. The items listed above may not be a complete list of foods and beverages you should avoid. Contact a dietitian for more information. Questions to ask a health care provider  Do I need to meet with a diabetes educator?  Do I need to meet with a dietitian?  What number can I call if I have questions?  When are the best times to check my blood glucose? Where to find more information:  American Diabetes Association: diabetes.org  Academy of Nutrition and Dietetics: www.eatright.org  National Institute of Diabetes and Digestive and Kidney Diseases: www.niddk.nih.gov  Association of Diabetes Care and Education Specialists: www.diabeteseducator.org Summary  It is important to have healthy eating   habits because your blood sugar (glucose) levels are greatly affected by what you eat and drink.  A healthy meal plan will help you control your blood glucose and maintain a healthy lifestyle.  Your health care provider may recommend that you work with a dietitian to make a meal plan that is best for you.  Keep in mind that carbohydrates (carbs) and alcohol have immediate effects on your blood glucose levels. It is important to count carbs and to use alcohol carefully. This information is not intended to replace advice given to you by your health care provider. Make sure you discuss any questions you have with your health care provider. Document Revised: 04/17/2019 Document Reviewed: 04/17/2019 Elsevier Patient Education  2021 [employer] Inc.  

## 2020-08-13 NOTE — Assessment & Plan Note (Signed)
Chronic, ongoing.  Improved symptoms with daily Prilosec, will maintain this at this time and attempt to reduce to discontinue in future.  If poor tolerance reduction then continue current regimen.  Return at end of May.

## 2020-08-13 NOTE — Assessment & Plan Note (Signed)
Ongoing and improved at this time.  Continue daily Miralax and Prilosec, which are offering her benefit.  Recommend she return to office if any red flag symptoms or abdominal pain present.

## 2020-08-13 NOTE — Progress Notes (Signed)
BP 123/72   Pulse 77   Temp 97.7 F (36.5 C) (Oral)   Wt 179 lb 3.2 oz (81.3 kg)   LMP  (LMP Unknown)   SpO2 98%   BMI 30.28 kg/m    Subjective:    Patient ID: Mary Parks, female    DOB: 1951/04/17, 70 y.o.   MRN: [id number]  HPI: Mary Parks is a 70 y.o. female  Chief Complaint  Patient presents with  . Abdominal Pain    Patient is here to follow up for abdominal pain.   . Sinus Problem    Patient states she has some eye pressure and sinus drainage states it is due to her allergies.    ABDOMINAL PAIN  Has been ongoing on and off since gastritis in November, but is improving at this time -- "a lot better".  Has a bowel movement once a day -- is not straining as much with Miralax on board.  Reports she started taking Prilosec recently which has helped. Duration:months Severity: 0/10 Quality: dull, aching and burning Alleviating factors: Omeprazole and Miralax Aggravating factors: citrus drinks Status: stable Treatments attempted: none Fever: no Nausea: occasional Vomiting: no Weight loss: 5 pounds Decreased appetite: a little Diarrhea: no Constipation: none Blood in stool: no Heartburn: yes Jaundice: no Rash: no Dysuria/urinary frequency: no Hematuria: no History of sexually transmitted disease: no Recurrent NSAID use: no   UPPER RESPIRATORY TRACT INFECTION Has had sinus pressure and drainage for past week, last infection was 04/30/20.  Has underlying allergies and is using Singulair and Flonase -- does not take OTC Claritin or Allegra. Fever: no Cough: yes Shortness of breath: no Wheezing: no Chest pain: none Chest tightness: no Chest congestion: no Nasal congestion: yes Runny nose: yes Post nasal drip: yes Sneezing: yes Sore throat: no Swollen glands: no Sinus pressure: yes Headache: yes Face pain: none Toothache: no Ear pain: yes bilateral Ear pressure: yes bilateral Eyes red/itching:yes Eye drainage/crusting: no  Vomiting: no Rash:  no Fatigue: yes Sick contacts: no Strep contacts: no  Context: fluctuating Recurrent sinusitis: no Relief with OTC cold/cough medications: no  Treatments attempted: mucinex   Relevant past medical, surgical, family and social history reviewed and updated as indicated. Interim medical history since our last visit reviewed. Allergies and medications reviewed and updated.  Review of Systems  Constitutional: Negative for activity change, appetite change, diaphoresis, fatigue and fever.  Respiratory: Negative for cough, chest tightness and shortness of breath.   Cardiovascular: Negative for chest pain, palpitations and leg swelling.  Gastrointestinal: Negative.   Endocrine: Negative for cold intolerance, heat intolerance, polydipsia, polyphagia and polyuria.  Neurological: Negative.   Psychiatric/Behavioral: Negative.     Per HPI unless specifically indicated above     Objective:    BP 123/72   Pulse 77   Temp 97.7 F (36.5 C) (Oral)   Wt 179 lb 3.2 oz (81.3 kg)   LMP  (LMP Unknown)   SpO2 98%   BMI 30.28 kg/m   Wt Readings from Last 3 Encounters:  08/13/20 179 lb 3.2 oz (81.3 kg)  07/16/20 178 lb (80.7 kg)  06/03/20 183 lb (83 kg)    Physical Exam Vitals and nursing note reviewed.  Constitutional:      General: She is awake. She is not in acute distress.    Appearance: She is well-developed and well-groomed. She is obese. She is not ill-appearing.  HENT:     Head: Normocephalic.     Right Ear: Hearing, ear  canal and external ear normal. No drainage. A middle ear effusion is present.     Left Ear: Hearing, ear canal and external ear normal. No drainage. A middle ear effusion is present.     Nose: Rhinorrhea present. Rhinorrhea is clear.     Right Sinus: Frontal sinus tenderness present. No maxillary sinus tenderness.     Left Sinus: Frontal sinus tenderness present. No maxillary sinus tenderness.     Mouth/Throat:     Mouth: Mucous membranes are moist.     Pharynx:  Posterior oropharyngeal erythema (with mild cobblestoning) present. No pharyngeal swelling or oropharyngeal exudate.  Eyes:     General: Lids are normal.        Right eye: No discharge.        Left eye: No discharge.     Conjunctiva/sclera: Conjunctivae normal.     Pupils: Pupils are equal, round, and reactive to light.  Neck:     Vascular: No carotid bruit.  Cardiovascular:     Rate and Rhythm: Normal rate and regular rhythm.     Heart sounds: Normal heart sounds.  Pulmonary:     Effort: Pulmonary effort is normal. No accessory muscle usage or respiratory distress.     Breath sounds: Normal breath sounds.  Abdominal:     General: Bowel sounds are normal. There is no distension or abdominal bruit.     Palpations: Abdomen is soft.     Tenderness: There is no abdominal tenderness. There is no right CVA tenderness, left CVA tenderness, guarding or rebound. Negative signs include Murphy's sign.     Hernia: A hernia is present. Hernia is present in the umbilical area.  Musculoskeletal:     Cervical back: Normal range of motion and neck supple.     Right lower leg: No edema.     Left lower leg: No edema.  Lymphadenopathy:     Cervical: No cervical adenopathy.  Skin:    General: Skin is warm and dry.  Neurological:     Mental Status: She is alert and oriented to person, place, and time.  Psychiatric:        Attention and Perception: Attention normal.        Mood and Affect: Mood normal.        Speech: Speech normal.        Behavior: Behavior normal. Behavior is cooperative.        Thought Content: Thought content normal.    Results for orders placed or performed in visit on 07/16/20  CBC with Differential/Platelet  Result Value Ref Range   WBC 3.3 (L) 3.4 - 10.8 x10E3/uL   RBC 4.59 3.77 - 5.28 x10E6/uL   Hemoglobin 12.4 11.1 - 15.9 g/dL   Hematocrit 38.4 34.0 - 46.6 %   MCV 84 79 - 97 fL   MCH 27.0 26.6 - 33.0 pg   MCHC 32.3 31.5 - 35.7 g/dL   RDW 13.8 11.7 - 15.4 %    Platelets 209 150 - 450 x10E3/uL   Neutrophils 58 Not Estab. %   Lymphs 30 Not Estab. %   Monocytes 7 Not Estab. %   Eos 4 Not Estab. %   Basos 1 Not Estab. %   Neutrophils Absolute 1.9 1.4 - 7.0 x10E3/uL   Lymphocytes Absolute 1.0 0.7 - 3.1 x10E3/uL   Monocytes Absolute 0.2 0.1 - 0.9 x10E3/uL   EOS (ABSOLUTE) 0.1 0.0 - 0.4 x10E3/uL   Basophils Absolute 0.0 0.0 - 0.2 x10E3/uL   Immature Granulocytes 0  Not Estab. %   Immature Grans (Abs) 0.0 0.0 - 0.1 x10E3/uL  Comprehensive metabolic panel  Result Value Ref Range   Glucose 240 (H) 65 - 99 mg/dL   BUN 17 8 - 27 mg/dL   Creatinine, Ser 1.25 (H) 0.57 - 1.00 mg/dL   GFR calc non Af Amer 43 (L) >59 mL/min/1.73   GFR calc Af Amer 50 (L) >59 mL/min/1.73   BUN/Creatinine Ratio 14 12 - 28   Sodium 143 134 - 144 mmol/L   Potassium 4.3 3.5 - 5.2 mmol/L   Chloride 103 96 - 106 mmol/L   CO2 25 20 - 29 mmol/L   Calcium 9.4 8.7 - 10.3 mg/dL   Total Protein 6.9 6.0 - 8.5 g/dL   Albumin 4.1 3.7 - 4.7 g/dL   Globulin, Total 2.8 1.5 - 4.5 g/dL   Albumin/Globulin Ratio 1.5 1.2 - 2.2   Bilirubin Total 0.4 0.0 - 1.2 mg/dL   Alkaline Phosphatase 111 44 - 121 IU/L   AST 20 0 - 40 IU/L   ALT 17 0 - 32 IU/L  Lipid Panel w/o Chol/HDL Ratio  Result Value Ref Range   Cholesterol, Total 163 100 - 199 mg/dL   Triglycerides 122 0 - 149 mg/dL   HDL 41 >39 mg/dL   VLDL Cholesterol Cal 22 5 - 40 mg/dL   LDL Chol Calc (NIH) 100 (H) 0 - 99 mg/dL  Microalbumin, Urine Waived  Result Value Ref Range   Microalb, Ur Waived 150 (H) 0 - 19 mg/L   Creatinine, Urine Waived 300 10 - 300 mg/dL   Microalb/Creat Ratio 30-300 (H) <30 mg/g  HgB A1c  Result Value Ref Range   Hgb A1c MFr Bld 8.1 (H) 4.8 - 5.6 %   Est. average glucose Bld gHb Est-mCnc 186 mg/dL  T4, free  Result Value Ref Range   Free T4 1.43 0.82 - 1.77 ng/dL  TSH  Result Value Ref Range   TSH 1.840 0.450 - 4.500 uIU/mL      Assessment & Plan:   Problem List Items Addressed This Visit       Respiratory   Frontal sinusitis    Acute x 7 days.  Script for Augmentin sent in and recommend to take with probiotic yogurt daily.  Will avoid Prednisone at this time due to T2DM and sugars.  Recommend OTC diabetic tussin for symptom relief + daily Claritin. Continue daily Flonase and Singulair due to allergic rhinitis.  Return to office for worsening or ongoing symptoms.      Relevant Medications   amoxicillin-clavulanate (AUGMENTIN) 875-125 MG tablet     Digestive   GERD (gastroesophageal reflux disease)    Chronic, ongoing.  Improved symptoms with daily Prilosec, will maintain this at this time and attempt to reduce to discontinue in future.  If poor tolerance reduction then continue current regimen.  Return at end of May.        Other   Obesity    Recommended eating smaller high protein, low fat meals more frequently and exercising 30 mins a day 5 times a week with a goal of 10-15lb weight loss in the next 3 months. Patient voiced their understanding and motivation to adhere to these recommendations.       Constipation - Primary    Ongoing and improved at this time.  Continue daily Miralax and Prilosec, which are offering her benefit.  Recommend she return to office if any red flag symptoms or abdominal pain present.  Follow up plan: Return in about 2 months (around 10/14/2020) for T2DM, HTN/HLD, COPD, Thyroid.

## 2020-08-13 NOTE — Assessment & Plan Note (Signed)
Recommended eating smaller high protein, low fat meals more frequently and exercising 30 mins a day 5 times a week with a goal of 10-15lb weight loss in the next 3 months. Patient voiced their understanding and motivation to adhere to these recommendations.  

## 2020-08-20 DIAGNOSIS — E119 Type 2 diabetes mellitus without complications: Secondary | ICD-10-CM | POA: Diagnosis not present

## 2020-08-20 LAB — HM DIABETES EYE EXAM

## 2020-09-01 ENCOUNTER — Telehealth: Payer: Self-pay | Admitting: Pharmacist

## 2020-09-01 NOTE — Progress Notes (Unsigned)
Chronic Care Management Pharmacy Note  09/01/2020 Name:  Mary Parks MRN:  [id number] DOB:  Apr 08, 1951  Subjective: Mary Parks is an 70 y.o. year old female who is a primary patient of Cannady, Barbaraann Faster, NP.  The CCM team was consulted for assistance with disease management and care coordination needs.    Engaged with patient by telephone for follow up visit in response to provider referral for pharmacy case management and/or care coordination services.   Consent to Services:  The patient was given information about Chronic Care Management services, agreed to services, and gave verbal consent prior to initiation of services.  Please see initial visit note for detailed documentation.   Patient Care Team: Venita Lick, NP as PCP - General (Nurse Practitioner) Kshirsagar, Thomasena Edis, MD as Referring Physician (Nephrology) Vladimir Faster, [facility] as Pharmacist (Pharmacist)  Recent office visits: 08/13/20- Cannady(PCP)- sinusitis, augmentin 875 mg bid x 7 07/16/20- Cannady(PCP)- blood work  Recent consult visits: [facility] visits: {Hospital DC Yes/No:25215}  Objective:  Lab Results  Component Value Date   CREATININE 1.25 (H) 07/16/2020   BUN 17 07/16/2020   GFRNONAA 43 (L) 07/16/2020   GFRAA 50 (L) 07/16/2020   NA 143 07/16/2020   K 4.3 07/16/2020   CALCIUM 9.4 07/16/2020   CO2 25 07/16/2020   GLUCOSE 240 (H) 07/16/2020    Lab Results  Component Value Date/Time   HGBA1C 8.1 (H) 07/16/2020 11:22 AM   HGBA1C 8.7 (H) 04/06/2020 01:33 AM   HGBA1C 9.3 01/02/2020 12:00 AM   HGBA1C 8.6 (H) 10/03/2019 10:41 AM   HGBA1C 8.4 (H) 11/29/2018 10:28 AM   HGBA1C 9.7 01/05/2016 12:00 AM   MICROALBUR 150 (H) 07/16/2020 11:20 AM   MICROALBUR 150 (H) 10/03/2019 10:41 AM    Last diabetic Eye exam:  Lab Results  Component Value Date/Time   HMDIABEYEEXA No Retinopathy 07/18/2019 12:00 AM    Last diabetic Foot exam:  Lab Results  Component Value Date/Time    HMDIABFOOTEX bilateral- normal 06/28/2016 12:00 AM     Lab Results  Component Value Date   CHOL 163 07/16/2020   HDL 41 07/16/2020   LDLCALC 100 (H) 07/16/2020   TRIG 122 07/16/2020    Hepatic Function Latest Ref Rng & Units 07/16/2020 04/09/2020 04/05/2020  Total Protein 6.0 - 8.5 g/dL 6.9 6.6 7.6  Albumin 3.7 - 4.7 g/dL 4.1 3.9 3.8  AST 0 - 40 IU/L $Remov'20 26 27  'cxcfJb$ ALT 0 - 32 IU/L $Remov'17 22 24  'KznrEV$ Alk Phosphatase 44 - 121 IU/L 111 83 76  Total Bilirubin 0.0 - 1.2 mg/dL 0.4 0.4 0.9    Lab Results  Component Value Date/Time   TSH 1.840 07/16/2020 11:22 AM   TSH 1.71 01/02/2020 12:00 AM   TSH 0.333 (L) 11/29/2018 10:31 AM   FREET4 1.43 07/16/2020 11:22 AM    CBC Latest Ref Rng & Units 07/16/2020 04/09/2020 04/06/2020  WBC 3.4 - 10.8 x10E3/uL 3.3(L) 3.3(L) 3.5(L)  Hemoglobin 11.1 - 15.9 g/dL 12.4 11.5 11.0(L)  Hematocrit 34.0 - 46.6 % 38.4 35.0 33.8(L)  Platelets 150 - 450 x10E3/uL 209 211 156    Lab Results  Component Value Date/Time   VD25OH 53.8 01/02/2020 12:00 AM   VD25OH 32.6 08/28/2018 01:11 PM    Clinical ASCVD: {YES/NO:21197} The 10-year ASCVD risk score Mikey Bussing DC Jr., et al., 2013) is: 21.8%   Values used to calculate the score:     Age: 82 years     Sex: Female  Is Non-Hispanic African American: Yes     Diabetic: Yes     Tobacco smoker: No     Systolic Blood Pressure: 025 mmHg     Is BP treated: Yes     HDL Cholesterol: 41 mg/dL     Total Cholesterol: 163 mg/dL    Depression screen [facility] 2/9 12/28/2019 12/25/2018 12/19/2017  Decreased Interest 0 0 0  Down, Depressed, Hopeless 0 0 0  PHQ - 2 Score 0 0 0  Altered sleeping 0 - -  Tired, decreased energy 0 - -  Change in appetite 0 - -  Feeling bad or failure about yourself  0 - -  Trouble concentrating 0 - -  Moving slowly or fidgety/restless 0 - -  Suicidal thoughts 0 - -  PHQ-9 Score 0 - -  Difficult doing work/chores Not difficult at all - -     ***Other: (CHADS2VASc if Afib, MMRC or CAT for COPD, ACT,  DEXA)  Social History   Tobacco Use  Smoking Status Never Smoker  Smokeless Tobacco Never Used   BP Readings from Last 3 Encounters:  08/13/20 123/72  07/16/20 120/75  06/03/20 139/74   Pulse Readings from Last 3 Encounters:  08/13/20 77  07/16/20 77  06/03/20 87   Wt Readings from Last 3 Encounters:  08/13/20 179 lb 3.2 oz (81.3 kg)  07/16/20 178 lb (80.7 kg)  06/03/20 183 lb (83 kg)   BMI Readings from Last 3 Encounters:  08/13/20 30.28 kg/m  07/16/20 30.08 kg/m  06/03/20 30.93 kg/m    Assessment/Interventions: Review of patient past medical history, allergies, medications, health status, including review of consultants reports, laboratory and other test data, was performed as part of comprehensive evaluation and provision of chronic care management services.   SDOH:  (Social Determinants of Health) assessments and interventions performed: {yes/no:20286}  SDOH Screenings   Alcohol Screen: Not on file  Depression (PHQ2-9): Low Risk   . PHQ-2 Score: 0  Financial Resource Strain: Low Risk   . Difficulty of Paying Living Expenses: Not very hard  Food Insecurity: No Food Insecurity  . Worried About Charity fundraiser in the Last Year: Never true  . Ran Out of Food in the Last Year: Never true  Housing: Not on file  Physical Activity: Insufficiently Active  . Days of Exercise per Week: 2 days  . Minutes of Exercise per Session: 60 min  Social Connections: Not on file  Stress: No Stress Concern Present  . Feeling of Stress : Not at all  Tobacco Use: Low Risk   . Smoking Tobacco Use: Never Smoker  . Smokeless Tobacco Use: Never Used  Transportation Needs: No Transportation Needs  . Lack of Transportation (Medical): No  . Lack of Transportation (Non-Medical): No    CCM Care Plan  Allergies  Allergen Reactions  . Celebrex [Celecoxib]     hallucinations  . Sulfa Antibiotics   . Elemental Sulfur Itching  . Sulfisoxazole Rash    Medications Reviewed Today     Reviewed by Venita Lick, NP (Nurse Practitioner) on 08/13/20 at 1111  Med List Status: <None>  Medication Order Taking? Sig Documenting Provider Last Dose Status Informant  albuterol (VENTOLIN HFA) 108 (90 Base) MCG/ACT inhaler [id number] Yes Inhale 2 puffs into the lungs every 6 (six) hours as needed for wheezing or shortness of breath. [name] T, NP Taking Active   amLODipine (NORVASC) 2.5 MG tablet [id number] Yes Take 2.5 mg by mouth daily. [provider] Taking  Active   amoxicillin-clavulanate (AUGMENTIN) 875-125 MG tablet [id number] Yes Take 1 tablet by mouth 2 (two) times daily for 7 days. [name] T, NP  Active   aspirin EC 81 MG tablet [id number] Yes Take 81 mg by mouth daily. [provider] Taking Active   atorvastatin (LIPITOR) 80 MG tablet [id number] Yes Take 1 tablet (80 mg total) by mouth daily. [name] T, NP Taking Active   Blood Pressure Monitoring (OMRON 3 SERIES BP MONITOR) DEVI [id number] Yes To check blood pressure daily and document for provider. [name] T, NP Taking Active   budesonide-formoterol [facility]) 160-4.5 MCG/ACT inhaler [id number] Yes Inhale 2 puffs into the lungs 2 (two) times daily. Venita Lick, NP Taking Active   calcium-vitamin D (OSCAL WITH D) 500-200 MG-UNIT tablet [id number] Yes Take 1 tablet by mouth. [provider] Taking Active   fluticasone (FLONASE) 50 MCG/ACT nasal spray [id number] Yes USE 2 SPRAY(S) IN EACH NOSTRIL ONCE DAILY Cannady, Jolene T, NP Taking Active   insulin lispro (HUMALOG) 100 UNIT/ML KwikPen [id number] Yes INJECT 8 UNITS SUBCTUANEOUSLY BEFORE BREAKFAST, 6 UNITS BEFORE LUNCH, AND 6 UNITS BEFORE EVENING MEAL. Jennye Boroughs, MD Taking Active   LANTUS SOLOSTAR 100 UNIT/ML Solostar Pen [id number] Yes INJECT 40 UNITS SUBCUTANEOUSLY TWICE DAILY Cannady, Jolene T, NP Taking Active   levothyroxine (SYNTHROID) 88 MCG tablet [id number] Yes Take 88 mcg by mouth every morning. [provider] Taking Active   lisinopril (ZESTRIL) 20 MG tablet [id number] Yes Take 1 tablet by mouth once daily Cannady, Jolene T, NP Taking Active   meclizine (ANTIVERT) 12.5 MG tablet [id number] Yes Take 1 tablet (12.5 mg total) by mouth 3 (three) times daily as needed for dizziness. [name] T, NP Taking Active   metoprolol tartrate (LOPRESSOR) 25 MG tablet [id number] Yes Take 25 mg by mouth 2 (two) times daily. [provider] Taking Active   montelukast (SINGULAIR) 10 MG tablet [id number] Yes Take 1 tablet (10 mg total) by mouth at bedtime. [name] T, NP Taking Active   ondansetron (ZOFRAN) 4 MG tablet [id number] Yes Take 1 tablet (4 mg total) by mouth every 8 (eight) hours as needed for nausea or vomiting. [name] T, NP Taking Active   ONE TOUCH ULTRA TEST test strip [id number] Yes 1 each by Other route 2 (two) times daily.  [provider] Taking Active            Med Note Kelby Aline Oct 24, 2019  3:38 PM)    Jonetta Speak LANCETS 89Q MISC [id number] Yes 2 (two) times daily.  [provider] Taking Active            Med Note Kelby Aline Oct 24, 2019  1:44 PM)    TRULICITY 1.5 YJ/8.5UD SOPN [id number] Yes INJECT 1.5 MG INTO THE SKIN ONCE A WEEK Venita Lick, NP Taking Active           Patient Active Problem List   Diagnosis Date Noted  . GERD (gastroesophageal reflux disease) 08/13/2020  . Constipation 07/16/2020  . Hypothyroidism 04/06/2020  . Obesity 10/03/2019  . Frontal sinusitis 11/29/2018  . Hyperlipidemia associated with type 2 diabetes mellitus ([place]) 06/29/2017  . Benign neoplasm of ascending colon   . Benign neoplasm of descending colon   . Allergic rhinitis 09/22/2016  . OA (osteoarthritis) of knee 06/25/2016  . Type 2 diabetes mellitus with diabetic neuropathy, with long-term current use of insulin ([place]) 10/03/2015  .  Chronic kidney disease, stage 3a ([place]) 12/17/2014  . Hypertension  associated with chronic kidney disease due to type 2 diabetes mellitus ([place]) 12/17/2014  . Diabetes mellitus with chronic kidney disease ([place]) 11/28/2014  . COPD (chronic obstructive pulmonary disease) ([place]) 11/19/2014    Immunization History  Administered Date(s) Administered  . Influenza, High Dose Seasonal PF 02/25/2018, 02/15/2019, 02/28/2020  . Influenza,inj,Quad PF,6+ Mos 03/25/2015  . Influenza-Unspecified 02/21/2014, 03/31/2016, 04/01/2017  . PFIZER(Purple Top)SARS-COV-2 Vaccination 07/06/2019, 07/27/2019, 03/13/2020  . Pneumococcal Conjugate-13 09/26/2013  . Pneumococcal Polysaccharide-23 03/13/2008, 03/25/2015  . Td 03/13/2008, 01/11/2020    Conditions to be addressed/monitored:  {USCCMDZASSESSMENTOPTIONS:23563}  There are no care plans that you recently modified to display for this patient.    Medication Assistance: {MEDASSISTANCEINFO:25044}  Patient's preferred pharmacy is:  [facility] [address] [place] [address] Phone: [phone] Fax: [phone]  Uses pill box? {Yes or If no, why not?:20788} Pt endorses ***% compliance  We discussed: {Pharmacy options:24294} Patient decided to: {US Pharmacy Plan:23885}  Care Plan and Follow Up Patient Decision:  {FOLLOWUP:24991}  Plan: {CM FOLLOW UP PLAN:25073}  ***

## 2020-09-08 ENCOUNTER — Telehealth: Payer: Self-pay

## 2020-09-08 NOTE — Chronic Care Management (AMB) (Signed)
Chronic Care Management Pharmacy Assistant   Name: Mary Parks  MRN: [id number] DOB: [birth year]  Reason for Encounter: Diabetes Mellitus Disease State Call   Recent office visits:  08/13/20- Mary Guarneri, NP-  Short course augmentin, follow up 2 months  07/16/20- Mary Guarneri, NP- chronic conditions addressed, atorvastatin increased from 40 mg to 80 mg, follow up 4 weeks for abdominal pain  Recent consult visits:  No visits noted   Hospital visits:  None in previous 6 months  Medications: Outpatient Encounter Medications as of 09/08/2020  Medication Sig  . albuterol (VENTOLIN HFA) 108 (90 Base) MCG/ACT inhaler Inhale 2 puffs into the lungs every 6 (six) hours as needed for wheezing or shortness of breath.  Marland Kitchen amLODipine (NORVASC) 2.5 MG tablet Take 2.5 mg by mouth daily.  Marland Kitchen aspirin EC 81 MG tablet Take 81 mg by mouth daily.  Marland Kitchen atorvastatin (LIPITOR) 80 MG tablet Take 1 tablet (80 mg total) by mouth daily.  . Blood Pressure Monitoring (OMRON 3 SERIES BP MONITOR) DEVI To check blood pressure daily and document for provider.  . budesonide-formoterol (SYMBICORT) 160-4.5 MCG/ACT inhaler Inhale 2 puffs into the lungs 2 (two) times daily.  . calcium-vitamin D (OSCAL WITH D) 500-200 MG-UNIT tablet Take 1 tablet by mouth.  . fluticasone (FLONASE) 50 MCG/ACT nasal spray USE 2 SPRAY(S) IN EACH NOSTRIL ONCE DAILY  . insulin lispro (HUMALOG) 100 UNIT/ML KwikPen INJECT 8 UNITS SUBCTUANEOUSLY BEFORE BREAKFAST, 6 UNITS BEFORE LUNCH, AND 6 UNITS BEFORE EVENING MEAL.  Marland Kitchen LANTUS SOLOSTAR 100 UNIT/ML Solostar Pen INJECT 40 UNITS SUBCUTANEOUSLY TWICE DAILY  . levothyroxine (SYNTHROID) 88 MCG tablet Take 88 mcg by mouth every morning.  Marland Kitchen lisinopril (ZESTRIL) 20 MG tablet Take 1 tablet by mouth once daily  . meclizine (ANTIVERT) 12.5 MG tablet Take 1 tablet (12.5 mg total) by mouth 3 (three) times daily as needed for dizziness.  . metoprolol tartrate (LOPRESSOR) 25 MG tablet Take 25 mg by mouth 2  (two) times daily.  . montelukast (SINGULAIR) 10 MG tablet Take 1 tablet (10 mg total) by mouth at bedtime.  . ondansetron (ZOFRAN) 4 MG tablet Take 1 tablet (4 mg total) by mouth every 8 (eight) hours as needed for nausea or vomiting.  . ONE TOUCH ULTRA TEST test strip 1 each by Other route 2 (two) times daily.   [name] DELICA LANCETS 56E MISC 2 (two) times daily.   . TRULICITY 1.5 PP/2.9JJ SOPN INJECT 1.5 MG INTO THE SKIN ONCE A WEEK   No facility-administered encounter medications on file as of 09/08/2020.   Recent Relevant Labs: Lab Results  Component Value Date/Time   HGBA1C 8.1 (H) 07/16/2020 11:22 AM   HGBA1C 8.7 (H) 04/06/2020 01:33 AM   HGBA1C 9.3 01/02/2020 12:00 AM   HGBA1C 8.6 (H) 10/03/2019 10:41 AM   HGBA1C 8.4 (H) 11/29/2018 10:28 AM   HGBA1C 9.7 01/05/2016 12:00 AM   MICROALBUR 150 (H) 07/16/2020 11:20 AM   MICROALBUR 150 (H) 10/03/2019 10:41 AM    Kidney Function Lab Results  Component Value Date/Time   CREATININE 1.25 (H) 07/16/2020 11:22 AM   CREATININE 1.04 (H) 04/09/2020 01:58 PM   GFRNONAA 43 (L) 07/16/2020 11:22 AM   GFRNONAA 51 (L) 04/06/2020 05:03 AM   GFRAA 50 (L) 07/16/2020 11:22 AM    Current antihyperglycemic regimen:  Lispro 8,6,6 units with meals Lantus 40 units bid Trulicity 1.5 mg weekly on Mondays  What recent interventions/DTPs have been made to improve glycemic control:  No recent  interventions  Have there been any recent hospitalizations or ED visits since last visit with CPP? No  Patient reports hypoglycemic symptoms, including Shaky and Nervous/irritable. She drinks orange juice or has a piece of peppermint candy to help  Patient denies hyperglycemic symptoms, including blurry vision, excessive thirst, fatigue, polyuria and weakness  How often are you checking your blood sugar?  Patient states her one touch machine checks her BS every 5 minutes  What are your blood sugars ranging?  o Fasting: 90,101 o Before meals:  130,140 o After meals: n/a o Bedtime: 200,150  During the week, how often does your blood glucose drop below 70? Never  Are you checking your feet daily/regularly?  Patient stated she has been checking her feet   Adherence Review: Is the patient currently on a STATIN medication? Yes Is the patient currently on ACE/ARB medication? Yes  Does the patient have >5 day gap between last estimated fill dates? No (HUMALOG) 100 UNIT/ML- 21 DS last filled 04/10/20 Received 5 pens last time: 10 pens left on 09/08/20  LANTUS SOLOSTAR 100 UNIT/ML- 42 DS last filled 65/46/50 TRULICITY 1.5 PT/4.6FK SOPN- 28 DS last filled 08/11/20  Star Rating Drugs: Atorvastatin 80 mg- 90 DS last filled 07/17/20 Lisinopril 20 mg- 90 DS last filled 07/30/20  Wilford Sports CPA, CMA

## 2020-09-12 ENCOUNTER — Other Ambulatory Visit: Payer: Self-pay | Admitting: Nurse Practitioner

## 2020-09-12 NOTE — Telephone Encounter (Signed)
Requested Prescriptions  Pending Prescriptions Disp Refills  . TRULICITY 1.5 QB/3.4LP SOPN [Pharmacy Med Name: Trulicity 1.5 FX/9.0WI Subcutaneous Solution Pen-injector] 4 mL 0    Sig: INJECT 1.5 MG SUBCUTANEOUSLY ONCE A WEEK     Endocrinology:  Diabetes - GLP-1 Receptor Agonists Failed - 09/12/2020  2:44 PM      Failed - HBA1C is between 0 and 7.9 and within 180 days    Hemoglobin A1C  Date Value Ref Range Status  01/02/2020 9.3  Final   Hgb A1c MFr Bld  Date Value Ref Range Status  07/16/2020 8.1 (H) 4.8 - 5.6 % Final    Comment:             Prediabetes: 5.7 - 6.4          Diabetes: >6.4          Glycemic control for adults with diabetes: <7.0          Passed - Valid encounter within last 6 months    Recent Outpatient Visits          1 month ago Slow transit constipation   [place], Jolene T, NP   1 month ago Type 2 diabetes mellitus with diabetic neuropathy, with long-term current use of insulin ([place])   [place], Roslyn T, NP   4 months ago Acute gastroenteritis   [place], [place]ley T, NP   5 months ago Acute gastroenteritis   [place], Jolene T, NP   8 months ago 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, with long-term current use of insulin ([place])   [place], [place] T, NP      Future Appointments            In 1 month Cannady, Barbaraann Faster, NP [facility]   In 3 months  [facility]           Signed Prescriptions Disp Refills   LANTUS SOLOSTAR 100 UNIT/ML Solostar Pen 30 mL 0    Sig: INJECT [place]     Endocrinology:  Diabetes - Insulins Failed - 09/12/2020  2:44 PM      Failed - HBA1C is between 0 and 7.9 and within 180 days    Hemoglobin A1C  Date Value Ref Range Status  01/02/2020 9.3  Final   Hgb A1c MFr Bld  Date Value Ref Range Status   07/16/2020 8.1 (H) 4.8 - 5.6 % Final    Comment:             Prediabetes: 5.7 - 6.4          Diabetes: >6.4          Glycemic control for adults with diabetes: <7.0          Passed - Valid encounter within last 6 months    Recent Outpatient Visits          1 month ago Slow transit constipation   [place], Jolene T, NP   1 month ago Type 2 diabetes mellitus with diabetic neuropathy, with long-term current use of insulin ([place])   [place], [place] T, NP   4 months ago Acute gastroenteritis   [place], [place] T, NP   5 months ago Acute gastroenteritis   [facility] [place], [place] T, NP   8 months ago Diabetes mellitus due  to underlying condition with stage 3a chronic kidney disease, with long-term current use of insulin ([place])   [place], Barbaraann Faster, NP      Future Appointments            In 1 month Cannady, Barbaraann Faster, NP [facility]   In 3 months  [facility]

## 2020-09-12 NOTE — Telephone Encounter (Signed)
Requested Prescriptions  Pending Prescriptions Disp Refills  . LANTUS SOLOSTAR 100 UNIT/ML Solostar Pen [Pharmacy Med Name: Lantus SoloStar 100 UNIT/ML Subcutaneous Solution Pen-injector] 30 mL 0    Sig: INJECT [place] DAILY     Endocrinology:  Diabetes - Insulins Failed - 09/12/2020  2:44 PM      Failed - HBA1C is between 0 and 7.9 and within 180 days    Hemoglobin A1C  Date Value Ref Range Status  01/02/2020 9.3  Final   Hgb A1c MFr Bld  Date Value Ref Range Status  07/16/2020 8.1 (H) 4.8 - 5.6 % Final    Comment:             Prediabetes: 5.7 - 6.4          Diabetes: >6.4          Glycemic control for adults with diabetes: <7.0          Passed - Valid encounter within last 6 months    Recent Outpatient Visits          1 month ago Slow transit constipation   [place], Jolene T, NP   1 month ago Type 2 diabetes mellitus with diabetic neuropathy, with long-term current use of insulin ([place])   [place], [place] T, NP   4 months ago Acute gastroenteritis   [place], [place] T, NP   5 months ago Acute gastroenteritis   [facility] [place], Jolene T, NP   8 months ago 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, with long-term current use of insulin ([place])   [place], Barbaraann Faster, NP      Future Appointments            In 1 month Cannady, Barbaraann Faster, NP [facility]   In 3 months  [facility]           . TRULICITY 1.5 DU/2.0UR SOPN [Pharmacy Med Name: Trulicity 1.5 [place]/7.0WC Subcutaneous Solution Pen-injector] 4 mL 0    Sig: INJECT 1.5 MG SUBCUTANEOUSLY ONCE A WEEK     Endocrinology:  Diabetes - GLP-1 Receptor Agonists Failed - 09/12/2020  2:44 PM      Failed - HBA1C is between 0 and 7.9 and within 180 days    Hemoglobin A1C  Date Value Ref Range Status  01/02/2020 9.3  Final    Hgb A1c MFr Bld  Date Value Ref Range Status  07/16/2020 8.1 (H) 4.8 - 5.6 % Final    Comment:             Prediabetes: 5.7 - 6.4          Diabetes: >6.4          Glycemic control for adults with diabetes: <7.0          Passed - Valid encounter within last 6 months    Recent Outpatient Visits          1 month ago Slow transit constipation   [place], Jolene T, NP   1 month ago Type 2 diabetes mellitus with diabetic neuropathy, with long-term current use of insulin ([place])   [place], [place] T, NP   4 months ago Acute gastroenteritis   [place], [place] T, NP   5 months ago Acute gastroenteritis   [facility] [place], Barbaraann Faster, NP   8  months ago 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, with long-term current use of insulin ([place])   [place], Barbaraann Faster, NP      Future Appointments            In 1 month Cannady, Barbaraann Faster, NP [facility]   In 3 months  [facility]

## 2020-09-19 DIAGNOSIS — E039 Hypothyroidism, unspecified: Secondary | ICD-10-CM | POA: Diagnosis not present

## 2020-09-19 DIAGNOSIS — E1165 Type 2 diabetes mellitus with hyperglycemia: Secondary | ICD-10-CM | POA: Diagnosis not present

## 2020-09-19 DIAGNOSIS — N289 Disorder of kidney and ureter, unspecified: Secondary | ICD-10-CM | POA: Diagnosis not present

## 2020-09-19 DIAGNOSIS — R5383 Other fatigue: Secondary | ICD-10-CM | POA: Diagnosis not present

## 2020-09-19 DIAGNOSIS — I10 Essential (primary) hypertension: Secondary | ICD-10-CM | POA: Diagnosis not present

## 2020-09-19 DIAGNOSIS — E785 Hyperlipidemia, unspecified: Secondary | ICD-10-CM | POA: Diagnosis not present

## 2020-09-19 DIAGNOSIS — R0600 Dyspnea, unspecified: Secondary | ICD-10-CM | POA: Diagnosis not present

## 2020-09-29 DIAGNOSIS — R5383 Other fatigue: Secondary | ICD-10-CM | POA: Diagnosis not present

## 2020-09-29 DIAGNOSIS — I10 Essential (primary) hypertension: Secondary | ICD-10-CM | POA: Diagnosis not present

## 2020-09-29 DIAGNOSIS — E785 Hyperlipidemia, unspecified: Secondary | ICD-10-CM | POA: Diagnosis not present

## 2020-09-29 DIAGNOSIS — E1165 Type 2 diabetes mellitus with hyperglycemia: Secondary | ICD-10-CM | POA: Diagnosis not present

## 2020-09-29 DIAGNOSIS — R0600 Dyspnea, unspecified: Secondary | ICD-10-CM | POA: Diagnosis not present

## 2020-10-03 DIAGNOSIS — R079 Chest pain, unspecified: Secondary | ICD-10-CM | POA: Diagnosis not present

## 2020-10-06 DIAGNOSIS — Z794 Long term (current) use of insulin: Secondary | ICD-10-CM | POA: Diagnosis not present

## 2020-10-06 DIAGNOSIS — E1165 Type 2 diabetes mellitus with hyperglycemia: Secondary | ICD-10-CM | POA: Diagnosis not present

## 2020-10-15 ENCOUNTER — Other Ambulatory Visit: Payer: Self-pay | Admitting: Nurse Practitioner

## 2020-10-15 DIAGNOSIS — R079 Chest pain, unspecified: Secondary | ICD-10-CM | POA: Diagnosis not present

## 2020-10-15 DIAGNOSIS — R0600 Dyspnea, unspecified: Secondary | ICD-10-CM | POA: Diagnosis not present

## 2020-10-21 DIAGNOSIS — I151 Hypertension secondary to other renal disorders: Secondary | ICD-10-CM | POA: Diagnosis not present

## 2020-10-21 DIAGNOSIS — N1831 Chronic kidney disease, stage 3a: Secondary | ICD-10-CM | POA: Diagnosis not present

## 2020-10-21 DIAGNOSIS — N2889 Other specified disorders of kidney and ureter: Secondary | ICD-10-CM | POA: Diagnosis not present

## 2020-10-21 DIAGNOSIS — N281 Cyst of kidney, acquired: Secondary | ICD-10-CM | POA: Diagnosis not present

## 2020-10-22 ENCOUNTER — Telehealth: Payer: Self-pay | Admitting: Pharmacist

## 2020-10-22 ENCOUNTER — Telehealth: Payer: Self-pay

## 2020-10-22 DIAGNOSIS — N183 Chronic kidney disease, stage 3 unspecified: Secondary | ICD-10-CM | POA: Diagnosis not present

## 2020-10-22 DIAGNOSIS — E119 Type 2 diabetes mellitus without complications: Secondary | ICD-10-CM | POA: Diagnosis not present

## 2020-10-22 DIAGNOSIS — I10 Essential (primary) hypertension: Secondary | ICD-10-CM | POA: Diagnosis not present

## 2020-10-22 NOTE — Progress Notes (Deleted)
Chronic Care Management Pharmacy Note  10/22/2020 Name:  Mary Parks MRN:  [id number] DOB:  May 26, 1950  Subjective: Mary Parks is an 70 y.o. year old female who is a primary patient of Cannady, Barbaraann Faster, NP.  The CCM team was consulted for assistance with disease management and care coordination needs.    Engaged with patient by telephone for follow up visit in response to provider referral for pharmacy case management and/or care coordination services.   Consent to Services:  The patient was given information about Chronic Care Management services, agreed to services, and gave verbal consent prior to initiation of services.  Please see initial visit note for detailed documentation.   Patient Care Team: Venita Lick, NP as PCP - General (Nurse Practitioner) Kshirsagar, Thomasena Edis, MD as Referring Physician (Nephrology) Vladimir Faster, [facility] as Pharmacist (Pharmacist)  Recent office visits: 08/13/20- Cannady(PCP)- sinusitis, augmentin 875 mg bid x 7 07/16/20- Cannady(PCP)- blood work  Recent consult visits: 5/31/22Ardyth Man (Nephrology) Renal US  Hospital visits:   Objective:  Lab Results  Component Value Date   CREATININE 1.25 (H) 07/16/2020   BUN 17 07/16/2020   GFRNONAA 43 (L) 07/16/2020   GFRAA 50 (L) 07/16/2020   NA 143 07/16/2020   K 4.3 07/16/2020   CALCIUM 9.4 07/16/2020   CO2 25 07/16/2020   GLUCOSE 240 (H) 07/16/2020    Lab Results  Component Value Date/Time   HGBA1C 8.1 (H) 07/16/2020 11:22 AM   HGBA1C 8.7 (H) 04/06/2020 01:33 AM   HGBA1C 9.3 01/02/2020 12:00 AM   HGBA1C 8.6 (H) 10/03/2019 10:41 AM   HGBA1C 8.4 (H) 11/29/2018 10:28 AM   HGBA1C 9.7 01/05/2016 12:00 AM   MICROALBUR 150 (H) 07/16/2020 11:20 AM   MICROALBUR 150 (H) 10/03/2019 10:41 AM    Last diabetic Eye exam:  Lab Results  Component Value Date/Time   HMDIABEYEEXA No Retinopathy 07/18/2019 12:00 AM    Last diabetic Foot exam:  Lab Results  Component Value Date/Time    HMDIABFOOTEX bilateral- normal 06/28/2016 12:00 AM     Lab Results  Component Value Date   CHOL 163 07/16/2020   HDL 41 07/16/2020   LDLCALC 100 (H) 07/16/2020   TRIG 122 07/16/2020    Hepatic Function Latest Ref Rng & Units 07/16/2020 04/09/2020 04/05/2020  Total Protein 6.0 - 8.5 g/dL 6.9 6.6 7.6  Albumin 3.7 - 4.7 g/dL 4.1 3.9 3.8  AST 0 - 40 IU/L $Remov'20 26 27  'RHsWlP$ ALT 0 - 32 IU/L $Remov'17 22 24  'dHhHJa$ Alk Phosphatase 44 - 121 IU/L 111 83 76  Total Bilirubin 0.0 - 1.2 mg/dL 0.4 0.4 0.9    Lab Results  Component Value Date/Time   TSH 1.840 07/16/2020 11:22 AM   TSH 1.71 01/02/2020 12:00 AM   TSH 0.333 (L) 11/29/2018 10:31 AM   FREET4 1.43 07/16/2020 11:22 AM    CBC Latest Ref Rng & Units 07/16/2020 04/09/2020 04/06/2020  WBC 3.4 - 10.8 x10E3/uL 3.3(L) 3.3(L) 3.5(L)  Hemoglobin 11.1 - 15.9 g/dL 12.4 11.5 11.0(L)  Hematocrit 34.0 - 46.6 % 38.4 35.0 33.8(L)  Platelets 150 - 450 x10E3/uL 209 211 156    Lab Results  Component Value Date/Time   VD25OH 53.8 01/02/2020 12:00 AM   VD25OH 32.6 08/28/2018 01:11 PM    Clinical ASCVD: {YES/NO:21197} The 10-year ASCVD risk score Mikey Bussing DC Jr., et al., 2013) is: 26.6%   Values used to calculate the score:     Age: 66 years  Sex: Female     Is Non-Hispanic African American: Yes     Diabetic: Yes     Tobacco smoker: No     Systolic Blood Pressure: 762 mmHg     Is BP treated: Yes     HDL Cholesterol: 41 mg/dL     Total Cholesterol: 163 mg/dL    Depression screen [facility] 2/9 12/28/2019 12/25/2018 12/19/2017  Decreased Interest 0 0 0  Down, Depressed, Hopeless 0 0 0  PHQ - 2 Score 0 0 0  Altered sleeping 0 - -  Tired, decreased energy 0 - -  Change in appetite 0 - -  Feeling bad or failure about yourself  0 - -  Trouble concentrating 0 - -  Moving slowly or fidgety/restless 0 - -  Suicidal thoughts 0 - -  PHQ-9 Score 0 - -  Difficult doing work/chores Not difficult at all - -     ***Other: (CHADS2VASc if Afib, MMRC or CAT for COPD, ACT,  DEXA)  Social History   Tobacco Use  Smoking Status Never Smoker  Smokeless Tobacco Never Used   BP Readings from Last 3 Encounters:  08/13/20 123/72  07/16/20 120/75  06/03/20 139/74   Pulse Readings from Last 3 Encounters:  08/13/20 77  07/16/20 77  06/03/20 87   Wt Readings from Last 3 Encounters:  08/13/20 179 lb 3.2 oz (81.3 kg)  07/16/20 178 lb (80.7 kg)  06/03/20 183 lb (83 kg)   BMI Readings from Last 3 Encounters:  08/13/20 30.28 kg/m  07/16/20 30.08 kg/m  06/03/20 30.93 kg/m    Assessment/Interventions: Review of patient past medical history, allergies, medications, health status, including review of consultants reports, laboratory and other test data, was performed as part of comprehensive evaluation and provision of chronic care management services.   SDOH:  (Social Determinants of Health) assessments and interventions performed: No  SDOH Screenings   Alcohol Screen: Not on file  Depression (PHQ2-9): Low Risk   . PHQ-2 Score: 0  Financial Resource Strain: Low Risk   . Difficulty of Paying Living Expenses: Not very hard  Food Insecurity: No Food Insecurity  . Worried About Charity fundraiser in the Last Year: Never true  . Ran Out of Food in the Last Year: Never true  Housing: Not on file  Physical Activity: Insufficiently Active  . Days of Exercise per Week: 2 days  . Minutes of Exercise per Session: 60 min  Social Connections: Not on file  Stress: No Stress Concern Present  . Feeling of Stress : Not at all  Tobacco Use: Low Risk   . Smoking Tobacco Use: Never Smoker  . Smokeless Tobacco Use: Never Used  Transportation Needs: No Transportation Needs  . Lack of Transportation (Medical): No  . Lack of Transportation (Non-Medical): No     Immunization History  Administered Date(s) Administered  . Influenza, High Dose Seasonal PF 02/25/2018, 02/15/2019, 02/28/2020  . Influenza,inj,Quad PF,6+ Mos 03/25/2015  . Influenza-Unspecified 02/21/2014,  03/31/2016, 04/01/2017  . PFIZER(Purple Top)SARS-COV-2 Vaccination 07/06/2019, 07/27/2019, 03/13/2020  . Pneumococcal Conjugate-13 09/26/2013  . Pneumococcal Polysaccharide-23 03/13/2008, 03/25/2015  . Td 03/13/2008, 01/11/2020    Conditions to be addressed/monitored:  Hypertension, Hyperlipidemia, Diabetes, GERD, COPD, Chronic Kidney Disease and Hypothyroidism  There are no care plans that you recently modified to display for this patient.    Medication Assistance: None required.  Patient affirms current coverage meets needs.  Patient's preferred pharmacy is:  [facility] [address], [place] [address] [place] [address] Phone: [phone] Fax: [phone]  Uses pill box? {Yes or If no, why not?:20788} Pt endorses ***% compliance  We discussed: {Pharmacy options:24294} Patient decided to: {US Pharmacy Plan:23885}  Care Plan and Follow Up Patient Decision:  {FOLLOWUP:24991}  Plan: {CM FOLLOW UP PLAN:25073}  ***

## 2020-10-23 ENCOUNTER — Ambulatory Visit: Payer: Medicare Other | Admitting: Nurse Practitioner

## 2020-10-29 DIAGNOSIS — R0600 Dyspnea, unspecified: Secondary | ICD-10-CM | POA: Diagnosis not present

## 2020-10-30 ENCOUNTER — Other Ambulatory Visit: Payer: Self-pay | Admitting: Nurse Practitioner

## 2020-11-04 ENCOUNTER — Other Ambulatory Visit: Payer: Self-pay | Admitting: Nurse Practitioner

## 2020-11-17 ENCOUNTER — Other Ambulatory Visit: Payer: Self-pay

## 2020-11-17 ENCOUNTER — Ambulatory Visit
Admission: EM | Admit: 2020-11-17 | Discharge: 2020-11-17 | Disposition: A | Discharge status: Home / Self Care | Payer: Medicare Other | Attending: Family Medicine | Admitting: Family Medicine

## 2020-11-17 DIAGNOSIS — J0100 Acute maxillary sinusitis, unspecified: Secondary | ICD-10-CM | POA: Diagnosis not present

## 2020-11-17 MED ORDER — DOXYCYCLINE HYCLATE 100 MG PO CAPS: 100.0000 mg | ORAL_CAPSULE | Freq: Two times a day (BID) | ORAL | 0 refills | Status: DC

## 2020-11-17 NOTE — ED Triage Notes (Signed)
Patient states that she has right ear pain that started on Wednesday and radiates down her neck. States that she has also had some sinus pressure.

## 2020-11-17 NOTE — ED Provider Notes (Signed)
[facility]    CSN: [id number] Arrival date & time: 11/17/20  0850  History   Chief Complaint Chief Complaint  Patient presents with   Ear Pain   HPI  70 year old female presents with respiratory symptoms.  Patient reports her symptoms started on Wednesday of last week.  She reports ear pain, sore throat, cough, postnasal drip.  No documented fever.  Patient also notes some congestion.  He rates her pain is 6/10 in severity and localized to the right ear.  No relieving factors.  No reported sick contacts.  No other complaints or concerns at this time.  Past Medical History:  Diagnosis Date   Asthma    Chest pain    DM2 (diabetes mellitus, type 2) ([place]) 1987   Dyspnea    Cath normal coronaries. normal EF. RA 11. PA 37/17 (26) LVEDP 36   GERD (gastroesophageal reflux disease)    HTN (hypertension)    Hyperlipidemia    Obesity    Osteoarthritis    knees   Renal disease    Thyroid disease    having thyroid removed 04/14/15   Vertigo    none recently    Patient Active Problem List   Diagnosis Date Noted   GERD (gastroesophageal reflux disease) 08/13/2020   Constipation 07/16/2020   Hypothyroidism 04/06/2020   Obesity 10/03/2019   Frontal sinusitis 11/29/2018   Hyperlipidemia associated with type 2 diabetes mellitus ([place]) 06/29/2017   Benign neoplasm of ascending colon    Benign neoplasm of descending colon    Allergic rhinitis 09/22/2016   OA (osteoarthritis) of knee 06/25/2016   Type 2 diabetes mellitus with diabetic neuropathy, with long-term current use of insulin ([place]) 10/03/2015   Chronic kidney disease, stage 3a ([place]) 12/17/2014   Hypertension associated with chronic kidney disease due to type 2 diabetes mellitus ([employer]) 12/17/2014   Diabetes mellitus with chronic kidney disease ([place]) 11/28/2014   COPD (chronic obstructive pulmonary disease) ([place]) 11/19/2014    Past Surgical History:  Procedure Laterality Date   CARDIAC CATHETERIZATION      CATARACT EXTRACTION W/PHACO Right 03/31/2015   Procedure: CATARACT EXTRACTION PHACO AND INTRAOCULAR LENS PLACEMENT ([place]);  Surgeon: Ronnell Freshwater, MD;  Location: [facility];  Service: Ophthalmology;  Laterality: Right;  DIABETIC - insulin   COLONOSCOPY WITH PROPOFOL N/A 11/12/2016   Procedure: COLONOSCOPY WITH PROPOFOL;  Surgeon: [name], MD;  Location: [facility];  Service: Endoscopy;  Laterality: N/A;  Diabetic - insulin   EYE SURGERY     HERNIA REPAIR     umbilical   POLYPECTOMY  11/12/2016   Procedure: POLYPECTOMY INTESTINAL;  Surgeon: [name], MD;  Location: [facility];  Service: Endoscopy;;   THYROIDECTOMY N/A 04/14/2015   Procedure: THYROIDECTOMY;  Surgeon: Margaretha Sheffield, MD;  Location: [facility];  Service: ENT;  Laterality: N/A;    OB History   No obstetric history on file.      Home Medications    Prior to Admission medications   Medication Sig Start Date End Date Taking? Authorizing Provider  albuterol (VENTOLIN HFA) 108 (90 Base) MCG/ACT inhaler Inhale 2 puffs into the lungs every 6 (six) hours as needed for wheezing or shortness of breath. 01/02/20  Yes Cannady, Jolene T, NP  amLODipine (NORVASC) 2.5 MG tablet Take 2.5 mg by mouth daily. 12/06/18  Yes [provider]  aspirin EC 81 MG tablet Take 81 mg by mouth daily.   Yes [provider]  atorvastatin (LIPITOR) 80 MG  tablet Take 1 tablet (80 mg total) by mouth daily. 07/17/20  Yes Cannady, Jolene T, NP  Blood Pressure Monitoring (OMRON 3 SERIES BP MONITOR) DEVI To check blood pressure daily and document for provider. 07/16/20  Yes Cannady, Jolene T, NP  budesonide-formoterol (SYMBICORT) 160-4.5 MCG/ACT inhaler Inhale 2 puffs into the lungs 2 (two) times daily. 01/02/20  Yes Cannady, Jolene T, NP  calcium-vitamin D (OSCAL WITH D) 500-200 MG-UNIT tablet Take 1 tablet by mouth.   Yes [provider]  doxycycline (VIBRAMYCIN) 100 MG capsule Take 1 capsule (100 mg  total) by mouth 2 (two) times daily. 11/17/20  Yes Oneisha Ammons G, DO  fluticasone (FLONASE) 50 MCG/ACT nasal spray USE 2 SPRAY(S) IN EACH NOSTRIL ONCE DAILY 01/11/20  Yes Cannady, Jolene T, NP  insulin lispro (HUMALOG) 100 UNIT/ML KwikPen INJECT 8 UNITS SUBCTUANEOUSLY BEFORE BREAKFAST, 6 UNITS BEFORE LUNCH, AND 6 UNITS BEFORE EVENING MEAL. 04/06/20  Yes Jennye Boroughs, MD  LANTUS SOLOSTAR 100 UNIT/ML Solostar Pen INJECT 40 UNITS SUBCUTANEOUSLY TWICE DAILY 10/15/20  Yes [name] T, NP  levothyroxine (SYNTHROID) 88 MCG tablet Take 88 mcg by mouth every morning. 08/13/19  Yes [provider]  lisinopril (ZESTRIL) 20 MG tablet Take 1 tablet by mouth once daily 10/30/20  Yes Cannady, Jolene T, NP  meclizine (ANTIVERT) 12.5 MG tablet Take 1 tablet (12.5 mg total) by mouth 3 (three) times daily as needed for dizziness. 10/03/19  Yes Cannady, Jolene T, NP  metoprolol tartrate (LOPRESSOR) 25 MG tablet Take 25 mg by mouth 2 (two) times daily. 07/09/16  Yes [provider]  montelukast (SINGULAIR) 10 MG tablet Take 1 tablet (10 mg total) by mouth at bedtime. 10/03/19  Yes Cannady, Jolene T, NP  ondansetron (ZOFRAN) 4 MG tablet Take 1 tablet (4 mg total) by mouth every 8 (eight) hours as needed for nausea or vomiting. 04/09/20  Yes Cannady, Jolene T, NP  ONE TOUCH ULTRA TEST test strip 1 each by Other route 2 (two) times daily.  03/15/15  Yes [provider]  Jonetta Speak LANCETS 81L MISC 2 (two) times daily.  03/15/15  Yes [provider]  TRULICITY 1.5 XB/2.6OM SOPN INJECT 1 SYRINGE SUBCUTANEOUSLY ONCE A WEEK (1.5  MG) 11/04/20  Yes Cannady, Henrine Screws T, NP    Family History Family History  Problem Relation Age of Onset   Diabetes Mother    Diabetes Father    Diabetes Sister    Diabetes Brother    Diabetes Sister    Diabetes Sister    Diabetes Maternal Grandmother    Diabetes Maternal Grandfather    Diabetes Paternal Grandmother    Diabetes Paternal Grandfather     Heart disease Neg Hx    Coronary artery disease Neg Hx    Breast cancer Neg Hx     Social History Social History   Tobacco Use   Smoking status: Never   Smokeless tobacco: Never  Vaping Use   Vaping Use: Never used  Substance Use Topics   Alcohol use: No   Drug use: No     Allergies   Celebrex [celecoxib], Sulfa antibiotics, Elemental sulfur, and Sulfisoxazole   Review of Systems Review of Systems  Constitutional:  Negative for fever.  HENT:  Positive for congestion, ear pain and sore throat.   Respiratory:  Positive for cough.     Physical Exam Triage Vital Signs ED Triage Vitals [11/17/20 0857]  Enc Vitals Group     BP (!) 145/76     Pulse  Rate 82     Resp 18     Temp 99 F (37.2 C)     Temp Source Oral     SpO2 98 %     Weight 176 lb (79.8 kg)     Height 5' 4.5" (1.638 m)     Head Circumference      Peak Flow      Pain Score 6     Pain Loc      Pain Edu?      Excl. in [place]?    Updated Vital Signs BP (!) 145/76 (BP Location: Right Arm)   Pulse 82   Temp 99 F (37.2 C) (Oral)   Resp 18   Ht 5' 4.5" (1.638 m)   Wt 79.8 kg   LMP  (LMP Unknown)   SpO2 98%   BMI 29.74 kg/m   Visual Acuity Right Eye Distance:   Left Eye Distance:   Bilateral Distance:    Right Eye Near:   Left Eye Near:    Bilateral Near:     Physical Exam Vitals and nursing note reviewed.  Constitutional:      General: She is not in acute distress.    Appearance: Normal appearance. She is not ill-appearing.  HENT:     Head: Normocephalic and atraumatic.     Right Ear: Tympanic membrane normal.     Left Ear: Tympanic membrane normal.     Mouth/Throat:     Pharynx: Oropharynx is clear. No oropharyngeal exudate or posterior oropharyngeal erythema.  Cardiovascular:     Rate and Rhythm: Normal rate and regular rhythm.  Pulmonary:     Effort: Pulmonary effort is normal.     Breath sounds: Normal breath sounds. No wheezing, rhonchi or rales.  Neurological:     Mental  Status: She is alert.  Psychiatric:        Mood and Affect: Mood normal.        Behavior: Behavior normal.    UC Treatments / Results  Labs (all labs ordered are listed, but only abnormal results are displayed) Labs Reviewed - No data to display  EKG   Radiology No results found.  Procedures Procedures (including critical care time)  Medications Ordered in UC Medications - No data to display  Initial Impression / Assessment and Plan / UC Course  I have reviewed the triage vital signs and the nursing notes.  Pertinent labs & imaging results that were available during my care of the patient were reviewed by me and considered in my medical decision making (see chart for details).    70 year old female presents with sinusitis. Treating with Doxycyline (Augmentin avoided given patient's renal disease).   Final Clinical Impressions(s) / UC Diagnoses   Final diagnoses:  Acute maxillary sinusitis, recurrence not specified   Discharge Instructions   None    ED Prescriptions     Medication Sig Dispense Auth. Provider   doxycycline (VIBRAMYCIN) 100 MG capsule Take 1 capsule (100 mg total) by mouth 2 (two) times daily. 14 capsule Thersa Salt G, DO      PDMP not reviewed this encounter.   Coral Spikes, [place] 11/17/20 [phone]

## 2020-11-20 ENCOUNTER — Telehealth: Payer: Self-pay | Admitting: Pharmacist

## 2020-11-20 NOTE — Chronic Care Management (AMB) (Signed)
    Chronic Care Management Pharmacy Assistant   Name: Mary Parks  MRN: [id number] DOB: 1950-10-09   Reason for Encounter: chart review   Medications: Outpatient Encounter Medications as of 11/20/2020  Medication Sig   albuterol (VENTOLIN HFA) 108 (90 Base) MCG/ACT inhaler Inhale 2 puffs into the lungs every 6 (six) hours as needed for wheezing or shortness of breath.   amLODipine (NORVASC) 2.5 MG tablet Take 2.5 mg by mouth daily.   aspirin EC 81 MG tablet Take 81 mg by mouth daily.   atorvastatin (LIPITOR) 80 MG tablet Take 1 tablet (80 mg total) by mouth daily.   Blood Pressure Monitoring (OMRON 3 SERIES BP MONITOR) DEVI To check blood pressure daily and document for provider.   budesonide-formoterol (SYMBICORT) 160-4.5 MCG/ACT inhaler Inhale 2 puffs into the lungs 2 (two) times daily.   calcium-vitamin D (OSCAL WITH D) 500-200 MG-UNIT tablet Take 1 tablet by mouth.   doxycycline (VIBRAMYCIN) 100 MG capsule Take 1 capsule (100 mg total) by mouth 2 (two) times daily.   fluticasone (FLONASE) 50 MCG/ACT nasal spray USE 2 SPRAY(S) IN EACH NOSTRIL ONCE DAILY   insulin lispro (HUMALOG) 100 UNIT/ML KwikPen INJECT 8 UNITS SUBCTUANEOUSLY BEFORE BREAKFAST, 6 UNITS BEFORE LUNCH, AND 6 UNITS BEFORE EVENING MEAL.   LANTUS SOLOSTAR 100 UNIT/ML Solostar Pen INJECT 40 UNITS SUBCUTANEOUSLY TWICE DAILY   levothyroxine (SYNTHROID) 88 MCG tablet Take 88 mcg by mouth every morning.   lisinopril (ZESTRIL) 20 MG tablet Take 1 tablet by mouth once daily   meclizine (ANTIVERT) 12.5 MG tablet Take 1 tablet (12.5 mg total) by mouth 3 (three) times daily as needed for dizziness.   metoprolol tartrate (LOPRESSOR) 25 MG tablet Take 25 mg by mouth 2 (two) times daily.   montelukast (SINGULAIR) 10 MG tablet Take 1 tablet (10 mg total) by mouth at bedtime.   ondansetron (ZOFRAN) 4 MG tablet Take 1 tablet (4 mg total) by mouth every 8 (eight) hours as needed for nausea or vomiting.   ONE TOUCH ULTRA TEST test strip  1 each by Other route 2 (two) times daily.    ONETOUCH DELICA LANCETS 02I MISC 2 (two) times daily.    TRULICITY 1.5 OX/7.3ZH SOPN INJECT 1 SYRINGE SUBCUTANEOUSLY ONCE A WEEK (1.5  MG)   No facility-administered encounter medications on file as of 11/20/2020.    Reviewed chart for medication changes and adherence.  Recent OV, Consult or Hospital visit:  11/17/20 Thersa Salt, DO Urgent care. Seen for sinusitis. No medication changes indicated  No gaps in adherence identified. Patient has follow up scheduled with pharmacy team. No further action required.   Lizbeth Bark Clinical Pharmacist Assistant [phone]

## 2020-12-06 ENCOUNTER — Other Ambulatory Visit: Payer: Self-pay | Admitting: Nurse Practitioner

## 2020-12-06 NOTE — Telephone Encounter (Signed)
Requested Prescriptions  Pending Prescriptions Disp Refills  . TRULICITY 1.5 AV/4.0JW SOPN [Pharmacy Med Name: Trulicity 1.5 JX/9.1YN Subcutaneous Solution Pen-injector] 4 mL 0    Sig: INJECT 1 SYRINGE SUBCUTANEOUSLY ONCE A WEEK (1.5MG )     Endocrinology:  Diabetes - GLP-1 Receptor Agonists Failed - 12/06/2020 12:07 PM      Failed - HBA1C is between 0 and 7.9 and within 180 days    Hemoglobin A1C  Date Value Ref Range Status  01/02/2020 9.3  Final   Hgb A1c MFr Bld  Date Value Ref Range Status  07/16/2020 8.1 (H) 4.8 - 5.6 % Final    Comment:             Prediabetes: 5.7 - 6.4          Diabetes: >6.4          Glycemic control for adults with diabetes: <7.0          Passed - Valid encounter within last 6 months    Recent Outpatient Visits          3 months ago Slow transit constipation   [place], Jolene T, NP   4 months ago Type 2 diabetes mellitus with diabetic neuropathy, with long-term current use of insulin ([place])   [place], [place] T, NP   7 months ago Acute gastroenteritis   [place], [place] T, NP   8 months ago Acute gastroenteritis   [place], Jolene T, NP   11 months ago 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, with long-term current use of insulin ([place])   [place], Barbaraann Faster, NP      Future Appointments            In 3 weeks [place], [facility]            . LANTUS SOLOSTAR 100 UNIT/ML Solostar Pen [Pharmacy Med Name: Lantus SoloStar 100 UNIT/ML Subcutaneous Solution Pen-injector] 30 mL 0    Sig: INJECT 40 UNITS SUBCUTANEOUSLY TWICE DAILY     Endocrinology:  Diabetes - Insulins Failed - 12/06/2020 12:07 PM      Failed - HBA1C is between 0 and 7.9 and within 180 days    Hemoglobin A1C  Date Value Ref Range Status  01/02/2020 9.3  Final   Hgb A1c MFr Bld  Date Value Ref Range Status   07/16/2020 8.1 (H) 4.8 - 5.6 % Final    Comment:             Prediabetes: 5.7 - 6.4          Diabetes: >6.4          Glycemic control for adults with diabetes: <7.0          Passed - Valid encounter within last 6 months    Recent Outpatient Visits          3 months ago Slow transit constipation   [place], Jolene T, NP   4 months ago Type 2 diabetes mellitus with diabetic neuropathy, with long-term current use of insulin ([place])   [place], [place] T, NP   7 months ago Acute gastroenteritis   [place], [place] T, NP   8 months ago Acute gastroenteritis   [facility] [place], [place] T, NP   11 months ago 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,  with long-term current use of insulin ([place])   [place], Barbaraann Faster, NP      Future Appointments            In 3 weeks [facility], [facility]

## 2020-12-11 DIAGNOSIS — N281 Cyst of kidney, acquired: Secondary | ICD-10-CM | POA: Diagnosis not present

## 2020-12-11 DIAGNOSIS — N1831 Chronic kidney disease, stage 3a: Secondary | ICD-10-CM | POA: Diagnosis not present

## 2020-12-11 DIAGNOSIS — N200 Calculus of kidney: Secondary | ICD-10-CM | POA: Diagnosis not present

## 2020-12-19 DIAGNOSIS — E1165 Type 2 diabetes mellitus with hyperglycemia: Secondary | ICD-10-CM | POA: Diagnosis not present

## 2020-12-19 DIAGNOSIS — E785 Hyperlipidemia, unspecified: Secondary | ICD-10-CM | POA: Diagnosis not present

## 2020-12-19 DIAGNOSIS — R5383 Other fatigue: Secondary | ICD-10-CM | POA: Diagnosis not present

## 2020-12-19 DIAGNOSIS — I10 Essential (primary) hypertension: Secondary | ICD-10-CM | POA: Diagnosis not present

## 2020-12-19 DIAGNOSIS — N289 Disorder of kidney and ureter, unspecified: Secondary | ICD-10-CM | POA: Diagnosis not present

## 2020-12-19 DIAGNOSIS — E039 Hypothyroidism, unspecified: Secondary | ICD-10-CM | POA: Diagnosis not present

## 2020-12-19 DIAGNOSIS — K219 Gastro-esophageal reflux disease without esophagitis: Secondary | ICD-10-CM | POA: Diagnosis not present

## 2020-12-23 DIAGNOSIS — E1165 Type 2 diabetes mellitus with hyperglycemia: Secondary | ICD-10-CM | POA: Diagnosis not present

## 2020-12-23 DIAGNOSIS — R5383 Other fatigue: Secondary | ICD-10-CM | POA: Diagnosis not present

## 2020-12-23 DIAGNOSIS — N289 Disorder of kidney and ureter, unspecified: Secondary | ICD-10-CM | POA: Diagnosis not present

## 2020-12-23 DIAGNOSIS — I10 Essential (primary) hypertension: Secondary | ICD-10-CM | POA: Diagnosis not present

## 2020-12-23 DIAGNOSIS — E785 Hyperlipidemia, unspecified: Secondary | ICD-10-CM | POA: Diagnosis not present

## 2020-12-25 DIAGNOSIS — E1165 Type 2 diabetes mellitus with hyperglycemia: Secondary | ICD-10-CM | POA: Diagnosis not present

## 2020-12-25 DIAGNOSIS — Z794 Long term (current) use of insulin: Secondary | ICD-10-CM | POA: Diagnosis not present

## 2020-12-29 ENCOUNTER — Ambulatory Visit (INDEPENDENT_AMBULATORY_CARE_PROVIDER_SITE_OTHER): Payer: Medicare Other

## 2020-12-29 VITALS — Ht 64.5 in | Wt 175.0 lb

## 2020-12-29 DIAGNOSIS — Z0000 Encounter for general adult medical examination without abnormal findings: Secondary | ICD-10-CM

## 2020-12-29 NOTE — Progress Notes (Signed)
I connected with Rhoderick Moody today by telephone and verified that I am speaking with the correct person using two identifiers. Location patient: home Location provider: work Persons participating in the virtual visit: Kendyle, Perin LPN.   I discussed the limitations, risks, security and privacy concerns of performing an evaluation and management service by telephone and the availability of in person appointments. I also discussed with the patient that there may be a patient responsible charge related to this service. The patient expressed understanding and verbally consented to this telephonic visit.    Interactive audio and video telecommunications were attempted between this provider and patient, however failed, due to patient having technical difficulties OR patient did not have access to video capability.  We continued and completed visit with audio only.     Vital signs may be patient reported or missing.  Subjective:   Mary Parks is a 70 y.o. female who presents for Medicare Annual (Subsequent) preventive examination.  Review of Systems     Cardiac Risk Factors include: advanced age (>74mn, >>54women);diabetes mellitus;dyslipidemia;hypertension;sedentary lifestyle     Objective:    Today's Vitals   12/29/20 1111  Weight: 175 lb (79.4 kg)  Height: 5' 4.5" (1.638 m)   Body mass index is 29.57 kg/m.  Advanced Directives 12/29/2020 11/17/2020 06/03/2020 04/06/2020 12/28/2019 12/25/2018 06/02/2018  Does Patient Have a Medical Advance Directive? No No No No Yes No No  Type of Advance Directive - - - - HPress photographerLiving will - -  Copy of [place] Chart? - - - - No - copy requested - -  Would patient like information on creating a medical advance directive? - - No - Patient declined No - Patient declined - - -    Current Medications (verified) Outpatient Encounter Medications as of 12/29/2020  Medication Sig   albuterol  (VENTOLIN HFA) 108 (90 Base) MCG/ACT inhaler Inhale 2 puffs into the lungs every 6 (six) hours as needed for wheezing or shortness of breath.   amLODipine (NORVASC) 2.5 MG tablet Take 2.5 mg by mouth daily.   aspirin EC 81 MG tablet Take 81 mg by mouth daily.   atorvastatin (LIPITOR) 80 MG tablet Take 1 tablet (80 mg total) by mouth daily.   budesonide-formoterol (SYMBICORT) 160-4.5 MCG/ACT inhaler Inhale 2 puffs into the lungs 2 (two) times daily.   calcium-vitamin D (OSCAL WITH D) 500-200 MG-UNIT tablet Take 1 tablet by mouth.   fluticasone (FLONASE) 50 MCG/ACT nasal spray USE 2 SPRAY(S) IN EACH NOSTRIL ONCE DAILY   insulin lispro (HUMALOG) 100 UNIT/ML KwikPen INJECT 8 UNITS SUBCTUANEOUSLY BEFORE BREAKFAST, 6 UNITS BEFORE LUNCH, AND 6 UNITS BEFORE EVENING MEAL.   LANTUS SOLOSTAR 100 UNIT/ML Solostar Pen INJECT 40 UNITS SUBCUTANEOUSLY TWICE DAILY   levothyroxine (SYNTHROID) 88 MCG tablet Take 88 mcg by mouth every morning.   lisinopril (ZESTRIL) 20 MG tablet Take 1 tablet by mouth once daily   meclizine (ANTIVERT) 12.5 MG tablet Take 1 tablet (12.5 mg total) by mouth 3 (three) times daily as needed for dizziness.   metoprolol tartrate (LOPRESSOR) 25 MG tablet Take 25 mg by mouth 2 (two) times daily.   montelukast (SINGULAIR) 10 MG tablet Take 1 tablet (10 mg total) by mouth at bedtime.   ONE TOUCH ULTRA TEST test strip 1 each by Other route 2 (two) times daily.    ONETOUCH DELICA LANCETS 399991111MISC 2 (two) times daily.    TRULICITY 1.5 M0000000SOPN INJECT 1 SYRINGE  SUBCUTANEOUSLY ONCE A WEEK (1.'5MG'$ )   Blood Pressure Monitoring (OMRON 3 SERIES BP MONITOR) DEVI To check blood pressure daily and document for provider. (Patient not taking: Reported on 12/29/2020)   doxycycline (VIBRAMYCIN) 100 MG capsule Take 1 capsule (100 mg total) by mouth 2 (two) times daily. (Patient not taking: Reported on 12/29/2020)   ondansetron (ZOFRAN) 4 MG tablet Take 1 tablet (4 mg total) by mouth every 8 (eight) hours as  needed for nausea or vomiting. (Patient not taking: Reported on 12/29/2020)   No facility-administered encounter medications on file as of 12/29/2020.    Allergies (verified) Celebrex [celecoxib], Sulfa antibiotics, Elemental sulfur, and Sulfisoxazole   History: Past Medical History:  Diagnosis Date   Asthma    Chest pain    DM2 (diabetes mellitus, type 2) ([place]) 1987   Dyspnea    Cath normal coronaries. normal EF. RA 11. PA 37/17 (26) LVEDP 36   GERD (gastroesophageal reflux disease)    HTN (hypertension)    Hyperlipidemia    Obesity    Osteoarthritis    knees   Renal disease    Thyroid disease    having thyroid removed 04/14/15   Vertigo    none recently   Past Surgical History:  Procedure Laterality Date   CARDIAC CATHETERIZATION     CATARACT EXTRACTION W/PHACO Right 03/31/2015   Procedure: CATARACT EXTRACTION PHACO AND INTRAOCULAR LENS PLACEMENT ([place]);  Surgeon: Ronnell Freshwater, MD;  Location: [facility];  Service: Ophthalmology;  Laterality: Right;  DIABETIC - insulin   COLONOSCOPY WITH PROPOFOL N/A 11/12/2016   Procedure: COLONOSCOPY WITH PROPOFOL;  Surgeon: [name], MD;  Location: [facility];  Service: Endoscopy;  Laterality: N/A;  Diabetic - insulin   EYE SURGERY     HERNIA REPAIR     umbilical   POLYPECTOMY  11/12/2016   Procedure: POLYPECTOMY INTESTINAL;  Surgeon: [name], MD;  Location: [facility];  Service: Endoscopy;;   THYROIDECTOMY N/A 04/14/2015   Procedure: THYROIDECTOMY;  Surgeon: Margaretha Sheffield, MD;  Location: [facility];  Service: ENT;  Laterality: N/A;   Family History  Problem Relation Age of Onset   Diabetes Mother    Diabetes Father    Diabetes Sister    Diabetes Brother    Diabetes Sister    Diabetes Sister    Diabetes Maternal Grandmother    Diabetes Maternal Grandfather    Diabetes Paternal Grandmother    Diabetes Paternal Grandfather    Heart disease Neg Hx    Coronary artery disease Neg Hx     Breast cancer Neg Hx    Social History   Socioeconomic History   Marital status: Widowed    Spouse name: Not on file   Number of children: Not on file   Years of education: Not on file   Highest education level: Some college, no degree  Occupational History   Occupation:  ITG, 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    Fish farm manager: OTHER   Occupation: retired  Tobacco Use   Smoking status: Never   Smokeless tobacco: Never  Scientific laboratory technician Use: Never used  Substance and Sexual Activity   Alcohol use: No   Drug use: No   Sexual activity: Not Currently  Other Topics Concern   Not on file  Social History Narrative   Not on file   Social Determinants of Health   Financial Resource Strain: Low Risk    Difficulty of Paying Living Expenses: Not hard at all  Food  Insecurity: No Food Insecurity   Worried About Charity fundraiser in the Last Year: Never true   Ran Out of Food in the Last Year: Never true  Transportation Needs: No Transportation Needs   Lack of Transportation (Medical): No   Lack of Transportation (Non-Medical): No  Physical Activity: Inactive   Days of Exercise per Week: 0 days   Minutes of Exercise per Session: 0 min  Stress: No Stress Concern Present   Feeling of Stress : Not at all  Social Connections: Not on file    Tobacco Counseling Counseling given: Not Answered   Clinical Intake:  Pre-visit preparation completed: Yes  Pain : No/denies pain     Nutritional Status: BMI 25 -29 Overweight Nutritional Risks: None Diabetes: Yes  How often do you need to have someone help you when you read instructions, pamphlets, or other written materials from your doctor or pharmacy?: 1 - Never What is the last grade level you completed in school?: 12th grade  Diabetic? Yes Nutrition Risk Assessment:  Has the patient had any N/V/D within the last 2 months?  No  Does the patient have any non-healing wounds?  No  Has the patient had any unintentional weight  loss or weight gain?  No   Diabetes:  Is the patient diabetic?  Yes  If diabetic, was a CBG obtained today?  No  Did the patient bring in their glucometer from home?  No  How often do you monitor your CBG's? Twice daily.   Financial Strains and Diabetes Management:  Are you having any financial strains with the device, your supplies or your medication? No .  Does the patient want to be seen by Chronic Care Management for management of their diabetes?  No  Would the patient like to be referred to a Nutritionist or for Diabetic Management?  No   Diabetic Exams:  Diabetic Eye Exam: Completed 08/2020 Diabetic Foot Exam: Completed 07/16/2020   Interpreter Needed?: No  Information entered by :: NAllen  LPN   Activities of Daily Living In your present state of health, do you have any difficulty performing the following activities: 12/29/2020 04/06/2020  Hearing? N N  Vision? N N  Difficulty concentrating or making decisions? N N  Walking or climbing stairs? Y N  Comment has bad knees -  Dressing or bathing? N N  Doing errands, shopping? N N  Preparing Food and eating ? N -  Using the Toilet? N -  In the past six months, have you accidently leaked urine? N -  Do you have problems with loss of bowel control? N -  Managing your Medications? N -  Managing your Finances? N -  Housekeeping or managing your Housekeeping? N -  Some recent data might be hidden    Patient Care Team: Venita Lick, NP as PCP - General (Nurse Practitioner) Ksh[place], Thomasena Edis, MD as Referring Physician (Nephrology) Vladimir Faster, [facility] as Pharmacist (Pharmacist)  Indicate any recent Medical Services you may have received from other than [facility] providers in the past year (date may be approximate).     Assessment:   This is a routine wellness examination for [place].  Hearing/Vision screen Vision Screening - Comments:: Regular eye exams, Dr. Wyatt Portela  Dietary issues and exercise activities  discussed: Current Exercise Habits: The patient does not participate in regular exercise at present   Goals Addressed             This Visit's Progress  Patient Stated       12/29/2020, no goals       Depression Screen PHQ 2/9 Scores 12/29/2020 12/28/2019 12/25/2018 12/19/2017 11/26/2016 10/03/2015  PHQ - 2 Score 0 0 0 0 0 0  PHQ- 9 Score - 0 - - - -    Fall Risk Fall Risk  12/29/2020 12/28/2019 12/25/2018 11/29/2018 12/19/2017  Falls in the past year? 0 0 0 0 No  Risk for fall due to : Medication side effect Medication side effect - - -  Follow up Falls evaluation completed;Education provided;Falls prevention discussed Falls evaluation completed;Education provided;Falls prevention discussed - Falls evaluation completed -    FALL RISK PREVENTION PERTAINING TO THE HOME:  Any stairs in or around the home? No  If so, are there any without handrails?  N/a Home free of loose throw rugs in walkways, pet beds, electrical cords, etc? Yes  Adequate lighting in your home to reduce risk of falls? Yes   ASSISTIVE DEVICES UTILIZED TO PREVENT FALLS:  Life alert? No  Use of a cane, walker or w/c? No  Grab bars in the bathroom? Yes  Shower chair or bench in shower? Yes  Elevated toilet seat or a handicapped toilet? Yes   TIMED UP AND GO:  Was the test performed? No .       Cognitive Function:     6CIT Screen 12/29/2020 12/28/2019 12/19/2017 11/26/2016  What Year? 0 points 0 points 0 points 0 points  What month? 0 points 0 points 0 points 0 points  What time? 0 points 0 points 0 points 0 points  Count back from 20 0 points 0 points 0 points 0 points  Months in reverse 0 points 0 points 0 points 0 points  Repeat phrase 0 points 0 points 2 points 0 points  Total Score 0 0 2 0    Immunizations Immunization History  Administered Date(s) Administered   Influenza, High Dose Seasonal PF 02/25/2018, 02/15/2019, 02/28/2020   Influenza,inj,Quad PF,6+ Mos 03/25/2015   Influenza-Unspecified 02/21/2014,  03/31/2016, 04/01/2017   PFIZER(Purple Top)SARS-COV-2 Vaccination 07/06/2019, 07/27/2019, 03/13/2020, 10/08/2020   Pneumococcal Conjugate-13 09/26/2013   Pneumococcal Polysaccharide-23 03/13/2008, 03/25/2015   Td 03/13/2008, 01/11/2020    TDAP status: Up to date  Flu Vaccine status: Due, Education has been provided regarding the importance of this vaccine. Advised may receive this vaccine at local pharmacy or Health Dept. Aware to provide a copy of the vaccination record if obtained from local pharmacy or Health Dept. Verbalized acceptance and understanding.  Pneumococcal vaccine status: Up to date  Covid-19 vaccine status: Completed vaccines  Qualifies for Shingles Vaccine? Yes   Zostavax completed No   Shingrix Completed?: No.    Education has been provided regarding the importance of this vaccine. Patient has been advised to call insurance company to determine out of pocket expense if they have not yet received this vaccine. Advised may also receive vaccine at local pharmacy or Health Dept. Verbalized acceptance and understanding.  Screening Tests Health Maintenance  Topic Date Due   OPHTHALMOLOGY EXAM  07/17/2020   INFLUENZA VACCINE  12/22/2020   Zoster Vaccines- Shingrix (1 of 2) 03/31/2021 (Originally 06/20/1998)   HEMOGLOBIN A1C  01/13/2021   FOOT EXAM  07/16/2021   COLONOSCOPY (Pts 45-36yr Insurance coverage will need to be confirmed)  11/12/2021   MAMMOGRAM  01/13/2022   TETANUS/TDAP  01/10/2030   COVID-19 Vaccine  Completed   Hepatitis C Screening  Completed   PNA vac Low Risk Adult  Completed  DEXA SCAN  Addressed   HPV VACCINES  Aged Out    Health Maintenance  Health Maintenance Due  Topic Date Due   OPHTHALMOLOGY EXAM  07/17/2020   INFLUENZA VACCINE  12/22/2020    Colorectal cancer screening: Type of screening: Colonoscopy. Completed 11/12/2016. Repeat every 5 years  Mammogram status: Completed 01/14/2020. Repeat every year  Bone Density status: Completed  01/20/2010.  Lung Cancer Screening: (Low Dose CT Chest recommended if Age 38-80 years, 30 pack-year currently smoking OR have quit w/in 15years.) does not qualify.   Lung Cancer Screening Referral: no  Additional Screening:  Hepatitis C Screening: does qualify; Completed 10/03/2015  Vision Screening: Recommended annual ophthalmology exams for early detection of glaucoma and other disorders of the eye. Is the patient up to date with their annual eye exam?  Yes  Who is the provider or what is the name of the office in which the patient attends annual eye exams? Dr. Wyatt Portela If pt is not established with a provider, would they like to be referred to a provider to establish care? No .   Dental Screening: Recommended annual dental exams for proper oral hygiene  Community Resource Referral / Chronic Care Management: CRR required this visit?  No   CCM required this visit?  No      Plan:     I have personally reviewed and noted the following in the patient's chart:   Medical and social history Use of alcohol, tobacco or illicit drugs  Current medications and supplements including opioid prescriptions.  Functional ability and status Nutritional status Physical activity Advanced directives List of other physicians Hospitalizations, surgeries, and ER visits in previous 12 months Vitals Screenings to include cognitive, depression, and falls Referrals and appointments  In addition, I have reviewed and discussed with patient certain preventive protocols, quality metrics, and best practice recommendations. A written personalized care plan for preventive services as well as general preventive health recommendations were provided to patient.     Kellie Simmering, LPN   [id number]   Nurse Notes:

## 2020-12-29 NOTE — Patient Instructions (Signed)
Mary Parks , Thank you for taking time to come for your Medicare Wellness Visit. I appreciate your ongoing commitment to your health goals. Please review the following plan we discussed and let me know if I can assist you in the future.   Screening recommendations/referrals: Colonoscopy: completed 11/12/2016, due 11/12/2021 Mammogram: completed 01/14/2020 Bone Density: completed 01/20/2010 Recommended yearly ophthalmology/optometry visit for glaucoma screening and checkup Recommended yearly dental visit for hygiene and checkup  Vaccinations: Influenza vaccine: due Pneumococcal vaccine: completed 03/25/2015 Tdap vaccine: completed 01/11/2020, due 01/10/2030 Shingles vaccine: decline   Covid-19: 10/08/2020, 03/13/2020, 07/27/2019, 07/06/2019  Advanced directives: Advance directive discussed with you today.   Conditions/risks identified: none  Next appointment: Follow up in one year for your annual wellness visit    Preventive Care 65 Years and Older, Female Preventive care refers to lifestyle choices and visits with your health care provider that can promote health and wellness. What does preventive care include? A yearly physical exam. This is also called an annual well check. Dental exams once or twice a year. Routine eye exams. Ask your health care provider how often you should have your eyes checked. Personal lifestyle choices, including: Daily care of your teeth and gums. Regular physical activity. Eating a healthy diet. Avoiding tobacco and drug use. Limiting alcohol use. Practicing safe sex. Taking low-dose aspirin every day. Taking vitamin and mineral supplements as recommended by your health care provider. What happens during an annual well check? The services and screenings done by your health care provider during your annual well check will depend on your age, overall health, lifestyle risk factors, and family history of disease. Counseling  Your health care provider may ask  you questions about your: Alcohol use. Tobacco use. Drug use. Emotional well-being. Home and relationship well-being. Sexual activity. Eating habits. History of falls. Memory and ability to understand (cognition). Work and work Statistician. Reproductive health. Screening  You may have the following tests or measurements: Height, weight, and BMI. Blood pressure. Lipid and cholesterol levels. These may be checked every 5 years, or more frequently if you are over 73 years old. Skin check. Lung cancer screening. You may have this screening every year starting at age 74 if you have a 30-pack-year history of smoking and currently smoke or have quit within the past 15 years. Fecal occult blood test (FOBT) of the stool. You may have this test every year starting at age 34. Flexible sigmoidoscopy or colonoscopy. You may have a sigmoidoscopy every 5 years or a colonoscopy every 10 years starting at age 49. Hepatitis C blood test. Hepatitis B blood test. Sexually transmitted disease (STD) testing. Diabetes screening. This is done by checking your blood sugar (glucose) after you have not eaten for a while (fasting). You may have this done every 1-3 years. Bone density scan. This is done to screen for osteoporosis. You may have this done starting at age 27. Mammogram. This may be done every 1-2 years. Talk to your health care provider about how often you should have regular mammograms. Talk with your health care provider about your test results, treatment options, and if necessary, the need for more tests. Vaccines  Your health care provider may recommend certain vaccines, such as: Influenza vaccine. This is recommended every year. Tetanus, diphtheria, and acellular pertussis (Tdap, Td) vaccine. You may need a Td booster every 10 years. Zoster vaccine. You may need this after age 95. Pneumococcal 13-valent conjugate (PCV13) vaccine. One dose is recommended after age 17. Pneumococcal  polysaccharide (PPSV23)  vaccine. One dose is recommended after age 28. Talk to your health care provider about which screenings and vaccines you need and how often you need them. This information is not intended to replace advice given to you by your health care provider. Make sure you discuss any questions you have with your health care provider. Document Released: 06/06/2015 Document Revised: 01/28/2016 Document Reviewed: 03/11/2015 Elsevier Interactive Patient Education  2017 [place] Prevention in the Home Falls can cause injuries. They can happen to people of all ages. There are many things you can do to make your home safe and to help prevent falls. What can I do on the outside of my home? Regularly fix the edges of walkways and driveways and fix any cracks. Remove anything that might make you trip as you walk through a door, such as a raised step or threshold. Trim any bushes or trees on the path to your home. Use bright outdoor lighting. Clear any walking paths of anything that might make someone trip, such as rocks or tools. Regularly check to see if handrails are loose or broken. Make sure that both sides of any steps have handrails. Any raised decks and porches should have guardrails on the edges. Have any leaves, snow, or ice cleared regularly. Use sand or salt on walking paths during winter. Clean up any spills in your garage right away. This includes oil or grease spills. What can I do in the bathroom? Use night lights. Install grab bars by the toilet and in the tub and shower. Do not use towel bars as grab bars. Use non-skid mats or decals in the tub or shower. If you need to sit down in the shower, use a plastic, non-slip stool. Keep the floor dry. Clean up any water that spills on the floor as soon as it happens. Remove soap buildup in the tub or shower regularly. Attach bath mats securely with double-sided non-slip rug tape. Do not have throw rugs and other  things on the floor that can make you trip. What can I do in the bedroom? Use night lights. Make sure that you have a light by your bed that is easy to reach. Do not use any sheets or blankets that are too big for your bed. They should not hang down onto the floor. Have a firm chair that has side arms. You can use this for support while you get dressed. Do not have throw rugs and other things on the floor that can make you trip. What can I do in the kitchen? Clean up any spills right away. Avoid walking on wet floors. Keep items that you use a lot in easy-to-reach places. If you need to reach something above you, use a strong step stool that has a grab bar. Keep electrical cords out of the way. Do not use floor polish or wax that makes floors slippery. If you must use wax, use non-skid floor wax. Do not have throw rugs and other things on the floor that can make you trip. What can I do with my stairs? Do not leave any items on the stairs. Make sure that there are handrails on both sides of the stairs and use them. Fix handrails that are broken or loose. Make sure that handrails are as long as the stairways. Check any carpeting to make sure that it is firmly attached to the stairs. Fix any carpet that is loose or worn. Avoid having throw rugs at the top or bottom of  the stairs. If you do have throw rugs, attach them to the floor with carpet tape. Make sure that you have a light switch at the top of the stairs and the bottom of the stairs. If you do not have them, ask someone to add them for you. What else can I do to help prevent falls? Wear shoes that: Do not have high heels. Have rubber bottoms. Are comfortable and fit you well. Are closed at the toe. Do not wear sandals. If you use a stepladder: Make sure that it is fully opened. Do not climb a closed stepladder. Make sure that both sides of the stepladder are locked into place. Ask someone to hold it for you, if possible. Clearly  mark and make sure that you can see: Any grab bars or handrails. First and last steps. Where the edge of each step is. Use tools that help you move around (mobility aids) if they are needed. These include: Canes. Walkers. Scooters. Crutches. Turn on the lights when you go into a dark area. Replace any light bulbs as soon as they burn out. Set up your furniture so you have a clear path. Avoid moving your furniture around. If any of your floors are uneven, fix them. If there are any pets around you, be aware of where they are. Review your medicines with your doctor. Some medicines can make you feel dizzy. This can increase your chance of falling. Ask your doctor what other things that you can do to help prevent falls. This information is not intended to replace advice given to you by your health care provider. Make sure you discuss any questions you have with your health care provider. Document Released: 03/06/2009 Document Revised: 10/16/2015 Document Reviewed: 06/14/2014 Elsevier Interactive Patient Education  2017 [employer].

## 2020-12-31 ENCOUNTER — Ambulatory Visit: Payer: Medicare Other

## 2021-01-05 ENCOUNTER — Other Ambulatory Visit: Payer: Self-pay | Admitting: Nurse Practitioner

## 2021-01-05 NOTE — Telephone Encounter (Signed)
Requested medication (s) are due for refill today: yes   Requested medication (s) are on the active medication list: yes   Last refill:  12/06/2020  Future visit scheduled:  yes   Notes to clinic:  Patient was due for follow up in May  Review for refill   Requested Prescriptions  Pending Prescriptions Disp Refills   TRULICITY 1.5 0000000 SOPN [Pharmacy Med Name: Trulicity 1.5 0000000 Subcutaneous Solution Pen-injector] 4 mL 0    Sig: INJECT Reading     Endocrinology:  Diabetes - GLP-1 Receptor Agonists Failed - 01/05/2021 10:21 AM      Failed - HBA1C is between 0 and 7.9 and within 180 days    Hemoglobin A1C  Date Value Ref Range Status  01/02/2020 9.3  Final   Hgb A1c MFr Bld  Date Value Ref Range Status  07/16/2020 8.1 (H) 4.8 - 5.6 % Final    Comment:             Prediabetes: 5.7 - 6.4          Diabetes: >6.4          Glycemic control for adults with diabetes: <7.0           Passed - Valid encounter within last 6 months    Recent Outpatient Visits           4 months ago Slow transit constipation   [place], Jolene T, NP   5 months ago Type 2 diabetes mellitus with diabetic neuropathy, with long-term current use of insulin ([place])   [place], [place] T, NP   8 months ago Acute gastroenteritis   [place], [place] T, NP   9 months ago Acute gastroenteritis   [facility] [place], Jolene T, NP   12 months ago 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, with long-term current use of insulin ([place])   [place], Barbaraann Faster, NP       Future Appointments             In 27 months [facility]

## 2021-01-05 NOTE — Telephone Encounter (Signed)
Patient is overdue for follow up. Please call to schedule.

## 2021-01-09 ENCOUNTER — Other Ambulatory Visit: Payer: Self-pay | Admitting: Nurse Practitioner

## 2021-01-09 NOTE — Telephone Encounter (Signed)
  Notes to clinic:   The original prescription was reordered on 12/06/2020 by Carlisle Beers, RN. Renewing this prescription may not be appropriate.  Requested Prescriptions  Pending Prescriptions Disp Refills   SEMGLEE, YFGN, 100 UNIT/ML Pen [Pharmacy Med Name: Semglee (yfgn) 100 UNIT/ML Subcutaneous Solution Pen-injector] 30 mL 0    Sig: INJECT 40 UNITS SUBCUTANEOUSLY TWICE DAILY     Off-Protocol Failed - 01/09/2021  9:03 AM      Failed - Medication not assigned to a protocol, review manually.      Passed - Valid encounter within last 12 months    Recent Outpatient Visits           4 months ago Slow transit constipation   [place], Jolene T, NP   5 months ago Type 2 diabetes mellitus with diabetic neuropathy, with long-term current use of insulin ([place])   [place], [place] T, NP   8 months ago Acute gastroenteritis   [place], [place] T, NP   9 months ago Acute gastroenteritis   [facility] [place], Jolene T, NP   12 months ago 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, with long-term current use of insulin ([place])   [place], Barbaraann Faster, NP       Future Appointments             In 4 weeks Cann[place], Barbaraann Faster, NP [facility]   In 36 months  [facility]

## 2021-01-28 DIAGNOSIS — E1165 Type 2 diabetes mellitus with hyperglycemia: Secondary | ICD-10-CM | POA: Diagnosis not present

## 2021-02-02 ENCOUNTER — Other Ambulatory Visit: Payer: Self-pay | Admitting: Nurse Practitioner

## 2021-02-02 NOTE — Telephone Encounter (Signed)
Requested medication (s) are due for refill today:   Yes  Requested medication (s) are on the active medication list:   Yes  Future visit scheduled:   Yes   Last ordered: 10/30/2020 #90, 0 refills  Returned because protocol failed, lab work due   Requested Prescriptions  Pending Prescriptions Disp Refills   lisinopril (ZESTRIL) 20 MG tablet [Pharmacy Med Name: Lisinopril 20 MG Oral Tablet] 90 tablet 0    Sig: Take 1 tablet by mouth once daily     Cardiovascular:  ACE Inhibitors Failed - 02/02/2021 11:10 AM      Failed - Cr in normal range and within 180 days    Creatinine, Ser  Date Value Ref Range Status  07/16/2020 1.25 (H) 0.57 - 1.00 mg/dL Final    Comment:                   **Effective July 21, 2020 [facility] will begin**                  reporting the 2021 CKD-EPI creatinine equation that                  estimates kidney function without a race variable.           Failed - K in normal range and within 180 days    Potassium  Date Value Ref Range Status  07/16/2020 4.3 3.5 - 5.2 mmol/L Final          Failed - Last BP in normal range    BP Readings from Last 1 Encounters:  11/17/20 (!) 145/76          Passed - Patient is not pregnant      Passed - Valid encounter within last 6 months    Recent Outpatient Visits           5 months ago Slow transit constipation   [place], [place] T, NP   6 months ago Type 2 diabetes mellitus with diabetic neuropathy, with long-term current use of insulin ([place])   [place], [place] T, NP   9 months ago Acute gastroenteritis   [place], [place] T, NP   9 months ago Acute gastroenteritis   [facility] [place], [place] T, NP   1 year ago 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, with long-term current use of insulin ([place])   [place], Barbaraann Faster, NP       Future Appointments              In 4 days Cannady, Barbaraann Faster, NP [facility]   In 56 months  [facility]

## 2021-02-02 NOTE — Telephone Encounter (Signed)
Requested medication (s) are due for refill today:   Yes  Requested medication (s) are on the active medication list:   Yes  Future visit scheduled:   Yes in 4 days with Jolene   Last ordered: 01/02/2020 #10.2 g, 7 refills  Returned because an alternative is being requested.   Requested Prescriptions  Pending Prescriptions Disp Refills   budesonide-formoterol (SYMBICORT) 160-4.5 MCG/ACT inhaler [Pharmacy Med Name: Budesonide-Formoterol Fumarate 160-4.5 MCG/ACT Inhalation Aerosol] 11 g 0    Sig: Inhale 2 puffs by mouth twice daily     Pulmonology:  Combination Products Passed - 02/02/2021 10:14 AM      Passed - Valid encounter within last 12 months    Recent Outpatient Visits           5 months ago Slow transit constipation   [place], Jolene T, NP   6 months ago Type 2 diabetes mellitus with diabetic neuropathy, with long-term current use of insulin ([place])   [place], [place] T, NP   9 months ago Acute gastroenteritis   [place], [place] T, NP   9 months ago Acute gastroenteritis   [facility] [place], [place] T, NP   1 year ago 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, with long-term current use of insulin ([place])   [place], Barbaraann Faster, NP       Future Appointments             In 4 days Cannady, Barbaraann Faster, NP [facility]   In 21 months  [facility]

## 2021-02-06 ENCOUNTER — Ambulatory Visit (INDEPENDENT_AMBULATORY_CARE_PROVIDER_SITE_OTHER): Payer: Medicare Other | Admitting: Nurse Practitioner

## 2021-02-06 ENCOUNTER — Other Ambulatory Visit: Payer: Self-pay

## 2021-02-06 ENCOUNTER — Encounter: Payer: Self-pay | Admitting: Nurse Practitioner

## 2021-02-06 VITALS — BP 113/74 | HR 79 | Temp 98.6°F | Wt 176.6 lb

## 2021-02-06 DIAGNOSIS — E0822 Diabetes mellitus due to underlying condition with diabetic chronic kidney disease: Secondary | ICD-10-CM | POA: Diagnosis not present

## 2021-02-06 DIAGNOSIS — E890 Postprocedural hypothyroidism: Secondary | ICD-10-CM

## 2021-02-06 DIAGNOSIS — Z6830 Body mass index (BMI) 30.0-30.9, adult: Secondary | ICD-10-CM

## 2021-02-06 DIAGNOSIS — N1831 Chronic kidney disease, stage 3a: Secondary | ICD-10-CM | POA: Diagnosis not present

## 2021-02-06 DIAGNOSIS — E6609 Other obesity due to excess calories: Secondary | ICD-10-CM

## 2021-02-06 DIAGNOSIS — I129 Hypertensive chronic kidney disease with stage 1 through stage 4 chronic kidney disease, or unspecified chronic kidney disease: Secondary | ICD-10-CM | POA: Diagnosis not present

## 2021-02-06 DIAGNOSIS — E1169 Type 2 diabetes mellitus with other specified complication: Secondary | ICD-10-CM | POA: Diagnosis not present

## 2021-02-06 DIAGNOSIS — E1140 Type 2 diabetes mellitus with diabetic neuropathy, unspecified: Secondary | ICD-10-CM

## 2021-02-06 DIAGNOSIS — E785 Hyperlipidemia, unspecified: Secondary | ICD-10-CM | POA: Diagnosis not present

## 2021-02-06 DIAGNOSIS — R3915 Urgency of urination: Secondary | ICD-10-CM | POA: Insufficient documentation

## 2021-02-06 DIAGNOSIS — E1122 Type 2 diabetes mellitus with diabetic chronic kidney disease: Secondary | ICD-10-CM

## 2021-02-06 DIAGNOSIS — Z794 Long term (current) use of insulin: Secondary | ICD-10-CM | POA: Diagnosis not present

## 2021-02-06 DIAGNOSIS — J4520 Mild intermittent asthma, uncomplicated: Secondary | ICD-10-CM

## 2021-02-06 LAB — URINALYSIS, ROUTINE W REFLEX MICROSCOPIC
Bilirubin, UA: NEGATIVE
Ketones, UA: NEGATIVE
Leukocytes,UA: NEGATIVE
Nitrite, UA: NEGATIVE
Specific Gravity, UA: >1.03 — ABNORMAL HIGH (ref 1.005–1.030)
Urobilinogen, Ur: 0.2 mg/dL (ref 0.2–1.0)
pH, UA: 5 (ref 5.0–7.5)

## 2021-02-06 LAB — MICROSCOPIC EXAMINATION: WBC, UA: NONE SEEN /hpf (ref 0–5)

## 2021-02-06 LAB — BAYER DCA HB A1C WAIVED: HB A1C (BAYER DCA - WAIVED): 8.5 % — ABNORMAL HIGH (ref 4.8–5.6)

## 2021-02-06 MED ORDER — BUDESONIDE-FORMOTEROL FUMARATE 160-4.5 MCG/ACT IN AERO: 2.0000 | INHALATION_SPRAY | Freq: Two times a day (BID) | RESPIRATORY_TRACT | 4 refills | Status: DC

## 2021-02-06 MED ORDER — FLUCONAZOLE 150 MG PO TABS: 150.0000 mg | ORAL_TABLET | Freq: Once | ORAL | 0 refills | Status: AC

## 2021-02-06 MED ORDER — AMOXICILLIN-POT CLAVULANATE 875-125 MG PO TABS: 1.0000 | ORAL_TABLET | Freq: Two times a day (BID) | ORAL | 0 refills | Status: AC

## 2021-02-06 MED ORDER — LANTUS SOLOSTAR 100 UNIT/ML ~~LOC~~ SOPN: PEN_INJECTOR | SUBCUTANEOUS | 5 refills | Status: DC

## 2021-02-06 MED ORDER — ATORVASTATIN CALCIUM 80 MG PO TABS: 80.0000 mg | ORAL_TABLET | Freq: Every day | ORAL | 4 refills | Status: DC

## 2021-02-06 MED ORDER — MECLIZINE HCL 12.5 MG PO TABS: 12.5000 mg | ORAL_TABLET | Freq: Three times a day (TID) | ORAL | 0 refills | Status: DC | PRN

## 2021-02-06 MED ORDER — MONTELUKAST SODIUM 10 MG PO TABS: 10.0000 mg | ORAL_TABLET | Freq: Every day | ORAL | 4 refills | Status: DC

## 2021-02-06 NOTE — Assessment & Plan Note (Signed)
Chronic, ongoing.  Continue collaboration with [facility] nephrology.  Lisinopril for kidney protection. CMP today.

## 2021-02-06 NOTE — Assessment & Plan Note (Signed)
Recommended eating smaller high protein, low fat meals more frequently and exercising 30 mins a day 5 times a week with a goal of 10-15lb weight loss in the next 3 months. Patient voiced their understanding and motivation to adhere to these recommendations.  

## 2021-02-06 NOTE — Assessment & Plan Note (Signed)
UA noting trace BLD, 2+ PRT, Many bacteria, and yeast.  At this time will send for culture and start Augmentin BID for 5 days + Diflucan for yeast.  Discussed with patient if culture returns showing need for alternative or no abx will alert her.  At this time increase hydration and intake Vitamin C.  Return for worsening or ongoing symptoms.

## 2021-02-06 NOTE — Assessment & Plan Note (Signed)
Chronic, ongoing, followed by endocrinology.  A1C 8.5% today, recommend continued focus on diet.  Urine ALB 150 and A:C 30-300 February 2022.  Continue current medication regimen and collaboration with endocrinology and nephrology. CMP today, continue Lisinopril for kidney protection.  Return in 6 months.

## 2021-02-06 NOTE — Patient Instructions (Signed)

## 2021-02-06 NOTE — Assessment & Plan Note (Signed)
S/P thyroidectomy years ago.  Followed by Dr. Rosario Jacks with endo.  Continue current medication regimen and adjust as needed.

## 2021-02-06 NOTE — Assessment & Plan Note (Signed)
Chronic, ongoing, followed by endocrinology.  A1C 8.5% today.  Continue current medication regimen and collaboration with endocrinology.  CMP today, continue Lisinopril for kidney protection.  Recommend patient continue to monitor BS at home.  Return in 3 months.

## 2021-02-06 NOTE — Assessment & Plan Note (Signed)
Chronic, ongoing.  Continue current medication regimen and adjust as needed. Lipid panel today. 

## 2021-02-06 NOTE — Assessment & Plan Note (Signed)
Chronic, stable with BP at goal today.  Continue current medication regimen, Lisinopril for kidney protection with diabetes and collaboration with [facility] nephrology.  Recommend she monitor BP at home at least 3 mornings a week.  Obtain CMP and urine today.  Focus on DASH diet at home.  Return in 3 months.

## 2021-02-06 NOTE — Assessment & Plan Note (Signed)
Chronic, stable on current medication regimen, with minimal use of Albuterol.  Consider spirometry in future.

## 2021-02-06 NOTE — Progress Notes (Signed)
BP 113/74   Pulse 79   Temp 98.6 F (37 C) (Oral)   Wt 176 lb 9.6 oz (80.1 kg)   LMP  (LMP Unknown)   SpO2 97%   BMI 29.85 kg/m    Subjective:    Patient ID: Mary Parks, female    DOB: Sep 28, 1950, 70 y.o.   MRN: [id number]  HPI: PAHOLA Parks is a 70 y.o. female  Chief Complaint  Patient presents with   COPD   Hypertension   Diabetes    Patient states she notices her Diabetes are out of wack and not controlled. Patient states the months of June and July she had to fly out to [place] to help a family member after the loss of her grandson. Patient requested Diabetic Eye Exam at today's visit.    Hyperlipidemia   Hypothyroidism   Urinary Tract Infection    Patient states she thinks she may have a UTI. Patient states she is having lower back pain, urinary urgency and not much coming out. Patient states she first notice it at the beginning of the week.    URINARY SYMPTOMS Started on Sunday with symptoms. Dysuria: yes Urinary frequency: yes Urgency: yes Small volume voids: yes Symptom severity: yes Urinary incontinence: yes Foul odor: no Hematuria: no Abdominal pain: no Back pain:  a little Suprapubic pain/pressure: yes Flank pain: no Fever:  no Vomiting: no Status: stable Previous urinary tract infection: yes Recurrent urinary tract infection: no Sexual activity: No sexually active Treatments attempted: increasing fluids    DIABETES Followed by Dr. Rosario Jacks, saw him in August  -  sees next 03/21/21.    A1C was 8.3%, do not have these records. Currently taking Trulicity 1.5 MG weekly and Lantus (45 un morning and 40 units at night) and Humalog 10 in morning, 8 lunch, and 6 in evening. Review of chart over past year A1C has remained in 8-9 range.   Hypoglycemic episodes: none Polydipsia/polyuria: no Visual disturbance: no Chest pain: no Paresthesias: no Glucose Monitoring: yes             Accucheck frequency: BID             Fasting glucose: this  morning was 80 -- average 100, had one morning of 200             Post prandial:             Evening: 200 range             Before meals: Taking Insulin?: yes             Long acting insulin: as above             Short acting insulin: as above Blood Pressure Monitoring: a few times a week Retinal Examination: Up To Date -- [facility] [place], Dr. Wyatt Portela Foot Exam: Up to Date Pneumovax: Up to Date Influenza: Up to Date Aspirin: yes  HYPOTHYROIDISM Followed by endocrinology, taking 88 MCG Levothyroxine.  Unable to view note in Epic.  Recent labs with them per patient. Thyroid control status:stable Satisfied with current treatment? yes Medication side effects: no Medication compliance: good compliance Etiology of hypothyroidism:  Recent dose adjustment:no Fatigue: no Cold intolerance: no Heat intolerance: no Weight gain: no Weight loss: no Constipation: no Diarrhea/loose stools: no Palpitations: no Lower extremity edema: no Anxiety/depressed mood: no   CHRONIC KIDNEY DISEASE Goes to nephrology at [facility], last saw in June with CRT 1.06 and eGFR 56.  Continues on  Lisinopril.  CKD status: stable Medications renally dose: yes Previous renal evaluation: yes Pneumovax:  Up to Date Influenza Vaccine:  Up to Date   HYPERTENSION / HYPERLIPIDEMIA Current medications include Metoprolol, Lisinopril, and Amlodipine for HTN + Lipitor for HLD.   Satisfied with current treatment? yes Duration of hypertension: chronic BP monitoring frequency: not checking BP range:  BP medication side effects: no Duration of hyperlipidemia: chronic Cholesterol medication side effects: no Cholesterol supplements: none Medication compliance: good compliance Aspirin: yes Recent stressors: no Recurrent headaches: no Visual changes: no Palpitations: no Dyspnea: no Chest pain: no Lower extremity edema: no Dizzy/lightheaded: no   ASTHMA Symbicort daily, Singulair, and Albuterol only as needed.  Non  smoker. COPD status: stable Satisfied with current treatment?: yes Oxygen use: no Dyspnea frequency: none Cough frequency: none Rescue inhaler frequency:  none Limitation of activity: no Productive cough: none Last Spirometry: unknown Pneumovax: Up to Date Influenza: Up to Date  Relevant past medical, surgical, family and social history reviewed and updated as indicated. Interim medical history since our last visit reviewed. Allergies and medications reviewed and updated.  Review of Systems  Constitutional:  Negative for activity change, appetite change, diaphoresis, fatigue and fever.  Respiratory:  Negative for cough, chest tightness and shortness of breath.   Cardiovascular:  Negative for chest pain, palpitations and leg swelling.  Gastrointestinal: Negative.   Endocrine: Negative for cold intolerance, heat intolerance, polydipsia, polyphagia and polyuria.  Neurological: Negative.   Psychiatric/Behavioral: Negative.     Per HPI unless specifically indicated above     Objective:    BP 113/74   Pulse 79   Temp 98.6 F (37 C) (Oral)   Wt 176 lb 9.6 oz (80.1 kg)   LMP  (LMP Unknown)   SpO2 97%   BMI 29.85 kg/m   Wt Readings from Last 3 Encounters:  02/06/21 176 lb 9.6 oz (80.1 kg)  12/29/20 175 lb (79.4 kg)  11/17/20 176 lb (79.8 kg)    Physical Exam Vitals and nursing note reviewed.  Constitutional:      General: She is awake. She is not in acute distress.    Appearance: She is well-developed and well-groomed. She is obese. She is not ill-appearing.  HENT:     Head: Normocephalic.     Right Ear: Hearing and external ear normal. No drainage.     Left Ear: Hearing and external ear normal. No drainage.  Eyes:     General: Lids are normal.        Right eye: No discharge.        Left eye: No discharge.     Conjunctiva/sclera: Conjunctivae normal.     Pupils: Pupils are equal, round, and reactive to light.  Neck:     Vascular: No carotid bruit.  Cardiovascular:      Rate and Rhythm: Normal rate and regular rhythm.     Heart sounds: Normal heart sounds.  Pulmonary:     Effort: Pulmonary effort is normal. No accessory muscle usage or respiratory distress.     Breath sounds: Normal breath sounds.  Abdominal:     General: Bowel sounds are normal. There is no distension or abdominal bruit.     Palpations: Abdomen is soft.     Tenderness: There is abdominal tenderness in the suprapubic area. There is no right CVA tenderness, left CVA tenderness, guarding or rebound. Negative signs include Murphy's sign.     Hernia: A hernia is present. Hernia is present in the umbilical area.  Musculoskeletal:     Cervical back: Normal range of motion and neck supple.     Right lower leg: No edema.     Left lower leg: No edema.  Lymphadenopathy:     Cervical: No cervical adenopathy.  Skin:    General: Skin is warm and dry.  Neurological:     Mental Status: She is alert and oriented to person, place, and time.     Cranial Nerves: Cranial nerves are intact.     Motor: Motor function is intact.     Coordination: Coordination is intact.     Gait: Gait is intact.     Deep Tendon Reflexes:     Reflex Scores:      Brachioradialis reflexes are 2+ on the right side and 2+ on the left side.      Patellar reflexes are 2+ on the right side and 2+ on the left side. Psychiatric:        Attention and Perception: Attention normal.        Mood and Affect: Mood normal.        Speech: Speech normal.        Behavior: Behavior normal. Behavior is cooperative.        Thought Content: Thought content normal.   Results for orders placed or performed in visit on 02/06/21  Microscopic Examination   BLD  Result Value Ref Range   WBC, UA None seen 0 - 5 /hpf   RBC 0-2 0 - 2 /hpf   Epithelial Cells (non renal) 0-10 0 - 10 /hpf   Mucus, UA Present (A) Not Estab.   Bacteria, UA Many (A) None seen/Few   Yeast, UA Present (A) None seen  Bayer DCA Hb A1c Waived  Result Value Ref  Range   HB A1C (BAYER DCA - WAIVED) 8.5 (H) 4.8 - 5.6 %  Urinalysis, Routine w reflex microscopic  Result Value Ref Range   Specific Gravity, UA >1.030 (H) 1.005 - 1.030   pH, UA 5.0 5.0 - 7.5   Color, UA Yellow Yellow   Appearance Ur Cloudy (A) Clear   Leukocytes,UA Negative Negative   Protein,UA 2+ (A) Negative/Trace   Glucose, UA Trace (A) Negative   Ketones, UA Negative Negative   RBC, UA Trace (A) Negative   Bilirubin, UA Negative Negative   Urobilinogen, Ur 0.2 0.2 - 1.0 mg/dL   Nitrite, UA Negative Negative   Microscopic Examination See below:       Assessment & Plan:   Problem List Items Addressed This Visit       Cardiovascular and Mediastinum   Hypertension 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  Continue current medication regimen, Lisinopril for kidney protection with diabetes and collaboration with [facility] nephrology.  Recommend she monitor BP at home at least 3 mornings a week.  Obtain CMP and urine today.  Focus on DASH diet at home.  Return in 3 months.      Relevant Medications   insulin glargine (LANTUS SOLOSTAR) 100 UNIT/ML Solostar Pen   atorvastatin (LIPITOR) 80 MG tablet   Other Relevant Orders   Bayer DCA Hb A1c Waived (Completed)     Respiratory   Asthma    Chronic, stable on current medication regimen, with minimal use of Albuterol.  Consider spirometry in future.      Relevant Medications   budesonide-formoterol (SYMBICORT) 160-4.5 MCG/ACT inhaler   montelukast (SINGULAIR) 10  MG tablet     Endocrine   Diabetes mellitus with chronic kidney disease (HCC)    Chronic, ongoing, followed by endocrinology.  A1C 8.5% today, recommend continued focus on diet.  Urine ALB 150 and A:C 30-300 February 2022.  Continue current medication regimen and collaboration with endocrinology and nephrology. CMP today, continue Lisinopril for kidney protection.  Return in 6 months.         Relevant Medications   insulin glargine (LANTUS SOLOSTAR) 100 UNIT/ML Solostar Pen   atorvastatin (LIPITOR) 80 MG tablet   Other Relevant Orders   Bayer DCA Hb A1c Waived (Completed)   Comprehensive metabolic panel   Type 2 diabetes mellitus with diabetic neuropathy, with long-term current use of insulin (HCC) - Primary    Chronic, ongoing, followed by endocrinology.  A1C 8.5% today.  Continue current medication regimen and collaboration with endocrinology.  CMP today, continue Lisinopril for kidney protection.  Recommend patient continue to monitor BS at home.  Return in 3 months.      Relevant Medications   insulin glargine (LANTUS SOLOSTAR) 100 UNIT/ML Solostar Pen   atorvastatin (LIPITOR) 80 MG tablet   Hyperlipidemia associated with type 2 diabetes mellitus (HCC)    Chronic, ongoing.  Continue current medication regimen and adjust as needed.  Lipid panel today.      Relevant Medications   insulin glargine (LANTUS SOLOSTAR) 100 UNIT/ML Solostar Pen   atorvastatin (LIPITOR) 80 MG tablet   Other Relevant Orders   Bayer DCA Hb A1c Waived (Completed)   Lipid Panel w/o Chol/HDL Ratio   Hypothyroidism    S/P thyroidectomy years ago.  Followed by Dr. Rosario Jacks with endo.  Continue current medication regimen and adjust as needed.           Genitourinary   Chronic kidney disease, stage 3a (HCC)    Chronic, ongoing.  Continue collaboration with [facility] nephrology.  Lisinopril for kidney protection. CMP today.        Other   Obesity    Recommended eating smaller high protein, low fat meals more frequently and exercising 30 mins a day 5 times a week with a goal of 10-15lb weight loss in the next 3 months. Patient voiced their understanding and motivation to adhere to these recommendations.       Relevant Medications   insulin glargine (LANTUS SOLOSTAR) 100 UNIT/ML Solostar Pen   Urinary urgency    UA noting trace BLD, 2+ PRT, Many bacteria, and yeast.  At this time will send for culture  and start Augmentin BID for 5 days + Diflucan for yeast.  Discussed with patient if culture returns showing need for alternative or no abx will alert her.  At this time increase hydration and intake Vitamin C.  Return for worsening or ongoing symptoms.      Relevant Orders   Urinalysis, Routine w reflex microscopic (Completed)   Urine Culture     Follow up plan: Return in about 4 months (around 06/03/2021) for T2DM, HTN/HLD, CKD, ASHTHMA, THYROID.

## 2021-02-07 LAB — COMPREHENSIVE METABOLIC PANEL
ALT: 20 IU/L (ref 0–32)
AST: 21 IU/L (ref 0–40)
Albumin/Globulin Ratio: 1.4 (ref 1.2–2.2)
Albumin: 4.1 g/dL (ref 3.7–4.7)
Alkaline Phosphatase: 105 IU/L (ref 44–121)
BUN/Creatinine Ratio: 16 (ref 12–28)
BUN: 19 mg/dL (ref 8–27)
Bilirubin Total: 0.4 mg/dL (ref 0.0–1.2)
CO2: 25 mmol/L (ref 20–29)
Calcium: 9.4 mg/dL (ref 8.7–10.3)
Chloride: 107 mmol/L — ABNORMAL HIGH (ref 96–106)
Creatinine, Ser: 1.2 mg/dL — ABNORMAL HIGH (ref 0.57–1.00)
Globulin, Total: 2.9 g/dL (ref 1.5–4.5)
Glucose: 176 mg/dL — ABNORMAL HIGH (ref 65–99)
Potassium: 4 mmol/L (ref 3.5–5.2)
Sodium: 147 mmol/L — ABNORMAL HIGH (ref 134–144)
Total Protein: 7 g/dL (ref 6.0–8.5)
eGFR: 48 mL/min/{1.73_m2} — ABNORMAL LOW (ref >59)

## 2021-02-07 LAB — LIPID PANEL W/O CHOL/HDL RATIO
Cholesterol, Total: 149 mg/dL (ref 100–199)
HDL: 40 mg/dL (ref >39)
LDL Chol Calc (NIH): 84 mg/dL (ref 0–99)
Triglycerides: 144 mg/dL (ref 0–149)
VLDL Cholesterol Cal: 25 mg/dL (ref 5–40)

## 2021-02-07 NOTE — Progress Notes (Signed)
Good morning crew, please let Aleeya know her labs have returned: - Kidney function continues to show baseline kidney disease with no worsening. We will continue to monitor closely.   - Sodium level is a little elevated, I recommend you cut back on salt intake and drink a little more water, will recheck next visit. - Cholesterol levels stable, continue Atorvastatin daily.  Any questions? Keep being amazing!!  Thank you for allowing me to participate in your care.  I appreciate you. Kindest regards, Devonte Migues

## 2021-02-10 DIAGNOSIS — E1165 Type 2 diabetes mellitus with hyperglycemia: Secondary | ICD-10-CM | POA: Diagnosis not present

## 2021-02-11 LAB — URINE CULTURE

## 2021-02-11 NOTE — Progress Notes (Signed)
Please let Adaleah know her urine showed no growth, she can stop Augmentin as no UTI present at this time.  Have a good day!! Keep being awesome!!  Thank you for allowing me to participate in your care.  I appreciate you. Kindest regards, Colisha Redler

## 2021-02-18 ENCOUNTER — Telehealth: Payer: Self-pay

## 2021-02-18 NOTE — Chronic Care Management (AMB) (Signed)
Chronic Care Management Pharmacy Assistant   Name: Mary Parks  MRN: [id number] DOB: Feb 24, 1951   Reason for Encounter: Disease State Diabetes     Recent office visits:  02/06/21 Marjie Skiff NP - Seen for diabetes - Labs ordered -  start Augmentin - Increase vitamin C intake - Culture ordered - Follow up in 3  months  09/19/20 Sherrie Mustache - Seen for diabetes - No notes available  08/13/20 Marjie Skiff NP - Seen for slow transit constipation - Start Augmentin - No follow up noted  07/16/20 Marjie Skiff NP - Seen for Diabetes - Labs ordered - Take Mag Citrate until having looser diarrhea type stool, then stop.  Then start taking Miralax daily. - Follow up in 6 months  06/20/20 Sherrie Mustache - Seen for diabetes - No notes available    Recent consult visits:  12/11/20 - Trevor Iha DO - Radiology - Seen for Stage 3a chronic kidney disease - No medication changes noted - No follow up noted  10/21/20 Trevor Iha DO - Nephrology - No medication changes noted - Follow up in 6 months  10/15/20 Laurier Nancy - Cardiology - Seen for chest pain - No notes available  10/03/20 Laurier Nancy - Cardiology - Seen for chest pain - No notes available  08/20/20 - Optometry - No notes available   Hospital visits:   Admitted to the hospital on 11/17/20 due to Acute maxillary sinusitis. Discharge date was 11/17/20. Discharged from [facility] health urgent care.   New?Medications Started at [facility] Discharge:?? -started doxycycline 100 MG capsule  Medication Changes at Hospital Discharge: N/A  Medications Discontinued at Hospital Discharge: N/A  Medications that remain the same after Hospital Discharge:??  -All other medications will remain the same.    Medications: Outpatient Encounter Medications as of 02/18/2021  Medication Sig   albuterol (VENTOLIN HFA) 108 (90 Base) MCG/ACT inhaler Inhale 2 puffs into the lungs every 6 (six) hours as needed for wheezing or shortness of  breath.   amLODipine (NORVASC) 2.5 MG tablet Take 2.5 mg by mouth daily.   aspirin EC 81 MG tablet Take 81 mg by mouth daily.   atorvastatin (LIPITOR) 80 MG tablet Take 1 tablet (80 mg total) by mouth daily.   BINAXNOW COVID-19 AG HOME TEST KIT Use as Directed on the Package   budesonide-formoterol (SYMBICORT) 160-4.5 MCG/ACT inhaler Inhale 2 puffs into the lungs 2 (two) times daily.   calcium-vitamin D (OSCAL WITH D) 500-200 MG-UNIT tablet Take 1 tablet by mouth.   fluticasone (FLONASE) 50 MCG/ACT nasal spray USE 2 SPRAY(S) IN EACH NOSTRIL ONCE DAILY   insulin glargine (LANTUS SOLOSTAR) 100 UNIT/ML Solostar Pen INJECT 45 UNITS SUBCUTANEOUSLY IN MORNING AND 40 UNITS AT NIGHT.   insulin lispro (HUMALOG) 100 UNIT/ML KwikPen INJECT 8 UNITS SUBCTUANEOUSLY BEFORE BREAKFAST, 6 UNITS BEFORE LUNCH, AND 6 UNITS BEFORE EVENING MEAL.   levothyroxine (SYNTHROID) 88 MCG tablet Take 88 mcg by mouth every morning.   lisinopril (ZESTRIL) 20 MG tablet Take 1 tablet by mouth once daily   meclizine (ANTIVERT) 12.5 MG tablet Take 1 tablet (12.5 mg total) by mouth 3 (three) times daily as needed for dizziness.   metoprolol tartrate (LOPRESSOR) 25 MG tablet Take 25 mg by mouth 2 (two) times daily.   montelukast (SINGULAIR) 10 MG tablet Take 1 tablet (10 mg total) by mouth at bedtime.   ONE TOUCH ULTRA TEST test strip 1 each by Other route 2 (two) times  daily.    ONETOUCH DELICA LANCETS 33V MISC 2 (two) times daily.    PFIZER-BIONT COVID-19 VAC-TRIS SUSP injection    SEMGLEE, YFGN, 100 UNIT/ML Pen INJECT 40 UNITS SUBCUTANEOUSLY TWICE DAILY   TRULICITY 1.5 OU/5.1QU SOPN INJECT 1 SYRINGE SUBCUTANEOUSLY ONCE A WEEK   No facility-administered encounter medications on file as of 02/18/2021.    Care Gaps: None   Recent Relevant Labs: Lab Results  Component Value Date/Time   HGBA1C 8.5 (H) 02/06/2021 11:12 AM   HGBA1C 8.1 (H) 07/16/2020 11:22 AM   HGBA1C 8.7 (H) 04/06/2020 01:33 AM   HGBA1C 9.3 01/02/2020 12:00 AM    HGBA1C 8.6 (H) 10/03/2019 10:41 AM   HGBA1C 9.7 01/05/2016 12:00 AM   MICROALBUR 150 (H) 07/16/2020 11:20 AM   MICROALBUR 150 (H) 10/03/2019 10:41 AM    Kidney Function Lab Results  Component Value Date/Time   CREATININE 1.20 (H) 02/06/2021 11:16 AM   CREATININE 1.25 (H) 07/16/2020 11:22 AM   GFRNONAA 43 (L) 07/16/2020 11:22 AM   GFRNONAA 51 (L) 04/06/2020 05:03 AM   GFRAA 50 (L) 07/16/2020 11:22 AM    Current antihyperglycemic regimen:  Lantus  Humalog  TRULICITY 1.5 IQ/7.9VY SOPN Insulin pen  What recent interventions/DTPs have been made to improve glycemic control:  N/a Have there been any recent hospitalizations or ED visits since last visit with CPP? Yes Patient denies hypoglycemic symptoms, including Pale, Sweaty, Shaky, Hungry, Nervous/irritable, and Vision changes Patient denies hyperglycemic symptoms, including blurry vision, excessive thirst, fatigue, polyuria, and weakness How often are you checking your blood sugar? once daily What are your blood sugars ranging?  Fasting: 9/28 @ 122 9/27 $Remove'@101'VLulHEi$  Before meals: N/a After meals: N/a Bedtime: N/a During the week, how often does your blood glucose drop below 70? Never Are you checking your feet daily/regularly?   Adherence Review: Is the patient currently on a STATIN medication? Yes Is the patient currently on ACE/ARB medication? Yes Does the patient have >5 day gap between last estimated fill dates? No  Patient scheduled for telephone appointment with Edison Nasuti on 03/02/21 @ 11am   Star Rating Drugs: Lantus last fill 02/06/21  30 DS  Atorvastatin 80 mg last fill 01/09/21 90 DS Humalog last fill 10/15/20 21 DS  Lisinopril 20 mg last fill 72/15/87 90 DS  Trulicity last fill 27/61/84  Andee Poles, CMA

## 2021-02-23 ENCOUNTER — Other Ambulatory Visit: Payer: Self-pay | Admitting: Nurse Practitioner

## 2021-02-23 DIAGNOSIS — Z1231 Encounter for screening mammogram for malignant neoplasm of breast: Secondary | ICD-10-CM

## 2021-02-25 ENCOUNTER — Other Ambulatory Visit: Payer: Self-pay

## 2021-02-25 ENCOUNTER — Ambulatory Visit
Admission: RE | Admit: 2021-02-25 | Discharge: 2021-02-25 | Disposition: A | Discharge status: Home / Self Care | Payer: Medicare Other | Source: Ambulatory Visit | Attending: Nurse Practitioner | Admitting: Nurse Practitioner

## 2021-02-25 DIAGNOSIS — Z1231 Encounter for screening mammogram for malignant neoplasm of breast: Secondary | ICD-10-CM | POA: Diagnosis not present

## 2021-03-01 NOTE — Progress Notes (Signed)
Please let Mary Parks know mammogram is normal, repeat in one year:)

## 2021-03-02 ENCOUNTER — Telehealth: Payer: Medicare Other

## 2021-03-09 ENCOUNTER — Telehealth: Payer: Medicare Other

## 2021-03-20 DIAGNOSIS — E039 Hypothyroidism, unspecified: Secondary | ICD-10-CM | POA: Diagnosis not present

## 2021-03-20 DIAGNOSIS — I10 Essential (primary) hypertension: Secondary | ICD-10-CM | POA: Diagnosis not present

## 2021-03-20 DIAGNOSIS — N289 Disorder of kidney and ureter, unspecified: Secondary | ICD-10-CM | POA: Diagnosis not present

## 2021-03-20 DIAGNOSIS — E559 Vitamin D deficiency, unspecified: Secondary | ICD-10-CM | POA: Diagnosis not present

## 2021-03-20 DIAGNOSIS — E785 Hyperlipidemia, unspecified: Secondary | ICD-10-CM | POA: Diagnosis not present

## 2021-03-20 DIAGNOSIS — E1165 Type 2 diabetes mellitus with hyperglycemia: Secondary | ICD-10-CM | POA: Diagnosis not present

## 2021-03-20 DIAGNOSIS — K219 Gastro-esophageal reflux disease without esophagitis: Secondary | ICD-10-CM | POA: Diagnosis not present

## 2021-03-25 DIAGNOSIS — Z794 Long term (current) use of insulin: Secondary | ICD-10-CM | POA: Diagnosis not present

## 2021-03-25 DIAGNOSIS — E1165 Type 2 diabetes mellitus with hyperglycemia: Secondary | ICD-10-CM | POA: Diagnosis not present

## 2021-03-26 DIAGNOSIS — D631 Anemia in chronic kidney disease: Secondary | ICD-10-CM | POA: Diagnosis not present

## 2021-03-26 DIAGNOSIS — N281 Cyst of kidney, acquired: Secondary | ICD-10-CM | POA: Diagnosis not present

## 2021-03-26 DIAGNOSIS — E1129 Type 2 diabetes mellitus with other diabetic kidney complication: Secondary | ICD-10-CM | POA: Diagnosis not present

## 2021-03-26 DIAGNOSIS — R809 Proteinuria, unspecified: Secondary | ICD-10-CM | POA: Diagnosis not present

## 2021-03-26 DIAGNOSIS — Z794 Long term (current) use of insulin: Secondary | ICD-10-CM | POA: Diagnosis not present

## 2021-03-26 DIAGNOSIS — N183 Chronic kidney disease, stage 3 unspecified: Secondary | ICD-10-CM | POA: Diagnosis not present

## 2021-03-26 DIAGNOSIS — I10 Essential (primary) hypertension: Secondary | ICD-10-CM | POA: Diagnosis not present

## 2021-03-26 DIAGNOSIS — N1831 Chronic kidney disease, stage 3a: Secondary | ICD-10-CM | POA: Diagnosis not present

## 2021-03-30 ENCOUNTER — Other Ambulatory Visit: Payer: Self-pay | Admitting: Nurse Practitioner

## 2021-03-30 NOTE — Telephone Encounter (Signed)
Requested medication (s) are due for refill today: Yes  Requested medication (s) are on the active medication list: Yes  Last refill:  04/06/20  Future visit scheduled: Yes  Notes to clinic:  Last filled by Dr. Mal Misty.     Requested Prescriptions  Pending Prescriptions Disp Refills   insulin lispro (HUMALOG) 100 UNIT/ML KwikPen [Pharmacy Med Name: Insulin Lispro (1 Unit Dial) 100 UNIT/ML Subcutaneous Solution Pen-injector] 15 mL 0    Sig: INJECT 20 UNITS SUBCTUANEOUSLY BEFORE BREAKFAST AND LUNCH, AND 30 UNITS BEFORE EVENING MEAL.     Endocrinology:  Diabetes - Insulins Failed - 03/30/2021 10:33 AM      Failed - HBA1C is between 0 and 7.9 and within 180 days    Hemoglobin A1C  Date Value Ref Range Status  01/02/2020 9.3  Final   HB A1C (BAYER DCA - WAIVED)  Date Value Ref Range Status  02/06/2021 8.5 (H) 4.8 - 5.6 % Final    Comment:             Prediabetes: 5.7 - 6.4          Diabetes: >6.4          Glycemic control for adults with diabetes: <7.0               **Please note reference interval change**           Passed - Valid encounter within last 6 months    Recent Outpatient Visits           1 month ago Type 2 diabetes mellitus with diabetic neuropathy, with long-term current use of insulin ([place])   [place], [place] T, NP   7 months ago Slow transit constipation   [place], [place] T, NP   8 months ago Type 2 diabetes mellitus with diabetic neuropathy, with long-term current use of insulin ([place])   [place], [place] T, NP   11 months ago Acute gastroenteritis   [place], [place] T, NP   11 months ago Acute gastroenteritis   [place], Barbaraann Faster, NP       Future Appointments             In 2 months Cannady, Barbaraann Faster, NP [facility]   In 9 months  [facility]

## 2021-03-30 NOTE — Telephone Encounter (Signed)
Patient is almost out of medication and will not be able to make an appointment until later December.   She has asked for a 1 time refill courtesy if possible.

## 2021-04-20 DIAGNOSIS — N1831 Chronic kidney disease, stage 3a: Secondary | ICD-10-CM | POA: Diagnosis not present

## 2021-05-04 ENCOUNTER — Other Ambulatory Visit: Payer: Self-pay | Admitting: Nurse Practitioner

## 2021-05-04 NOTE — Telephone Encounter (Signed)
Requested medication (s) are due for refill today: Yes  Requested medication (s) are on the active medication list: Yes  Last refill:  01/10/21  Future visit scheduled: Yes  Notes to clinic:  Prescription expired.    Requested Prescriptions  Pending Prescriptions Disp Refills   fluticasone (FLONASE) 50 MCG/ACT nasal spray [Pharmacy Med Name: Fluticasone Propionate 50 MCG/ACT Nasal Suspension] 16 g 0    Sig: USE 2 SPRAY(S) IN EACH NOSTRIL ONCE DAILY     Ear, Nose, and Throat: Nasal Preparations - Corticosteroids Passed - 05/04/2021  9:31 AM      Passed - Valid encounter within last 12 months    Recent Outpatient Visits           2 months ago Type 2 diabetes mellitus with diabetic neuropathy, with long-term current use of insulin ([place])   [place], Geronimo T, NP   8 months ago Slow transit constipation   [place], [place] T, NP   9 months ago Type 2 diabetes mellitus with diabetic neuropathy, with long-term current use of insulin ([place])   [place], [place] T, NP   1 year ago Acute gastroenteritis   [place], [place] T, NP   1 year ago Acute gastroenteritis   [place], Barbaraann Faster, NP       Future Appointments             In 1 month Cannady, Barbaraann Faster, NP [facility]   In 8 months  [facility]

## 2021-05-06 ENCOUNTER — Telehealth: Payer: Self-pay

## 2021-05-06 NOTE — Chronic Care Management (AMB) (Signed)
Chronic Care Management Pharmacy Assistant   Name: Mary Parks  MRN: [id number] DOB: September 03, 1950  Reason for Encounter: Disease State Diabetes Mellitus  Recent office visits:  02/06/21-Jolene T. Harvest Dark, NP (PCP) General follow up visit. Labs ordered. Follow up in 4 months.  Recent consult visits:  03/26/21-Surya Manivannan (Nephrology) General follow up visit. 12/19/20-Fayegh Dario Guardian (Internal Medicine) Notes not available.  Hospital visits:  Medication Reconciliation was completed by comparing discharge summary, patients EMR and Pharmacy list, and upon discussion with patient.  Admitted to the hospital on 11/17/20 due to Ear pain. Discharge date was 11/17/20. Discharged from [facility].    New?Medications Started at [facility] Discharge:?? -started  doxycycline (VIBRAMYCIN) 100 MG capsule  Medication Changes at Hospital Discharge: -Changed None noted  Medications Discontinued at Hospital Discharge: -Stopped None noted  Medications that remain the same after Hospital Discharge:??  -All other medications will remain the same.    Medications: Outpatient Encounter Medications as of 05/06/2021  Medication Sig   albuterol (VENTOLIN HFA) 108 (90 Base) MCG/ACT inhaler Inhale 2 puffs into the lungs every 6 (six) hours as needed for wheezing or shortness of breath.   amLODipine (NORVASC) 2.5 MG tablet Take 2.5 mg by mouth daily.   aspirin EC 81 MG tablet Take 81 mg by mouth daily.   atorvastatin (LIPITOR) 80 MG tablet Take 1 tablet (80 mg total) by mouth daily.   BINAXNOW COVID-19 AG HOME TEST KIT Use as Directed on the Package   budesonide-formoterol (SYMBICORT) 160-4.5 MCG/ACT inhaler Inhale 2 puffs into the lungs 2 (two) times daily.   calcium-vitamin D (OSCAL WITH D) 500-200 MG-UNIT tablet Take 1 tablet by mouth.   fluticasone (FLONASE) 50 MCG/ACT nasal spray Use 2 spray(s) in each nostril once daily   insulin glargine (LANTUS SOLOSTAR) 100 UNIT/ML Solostar Pen  INJECT 45 UNITS SUBCUTANEOUSLY IN MORNING AND 40 UNITS AT NIGHT.   insulin lispro (HUMALOG) 100 UNIT/ML KwikPen INJECT 20 UNITS SUBCTUANEOUSLY BEFORE BREAKFAST AND LUNCH, AND 30 UNITS BEFORE EVENING MEAL.   levothyroxine (SYNTHROID) 88 MCG tablet Take 88 mcg by mouth every morning.   lisinopril (ZESTRIL) 20 MG tablet Take 1 tablet by mouth once daily   meclizine (ANTIVERT) 12.5 MG tablet Take 1 tablet (12.5 mg total) by mouth 3 (three) times daily as needed for dizziness.   metoprolol tartrate (LOPRESSOR) 25 MG tablet Take 25 mg by mouth 2 (two) times daily.   montelukast (SINGULAIR) 10 MG tablet Take 1 tablet (10 mg total) by mouth at bedtime.   ONE TOUCH ULTRA TEST test strip 1 each by Other route 2 (two) times daily.    ONETOUCH DELICA LANCETS 33G MISC 2 (two) times daily.    PFIZER-BIONT COVID-19 VAC-TRIS SUSP injection    SEMGLEE, YFGN, 100 UNIT/ML Pen INJECT 40 UNITS SUBCUTANEOUSLY TWICE DAILY   TRULICITY 1.5 MG/0.5ML SOPN INJECT 1 SYRINGE SUBCUTANEOUSLY ONCE A WEEK   No facility-administered encounter medications on file as of 05/06/2021.   Current antihyperglycemic regimen:  Humalog 100 unit inject 20 units INJECT 20 UNITS SUBCTUANEOUSLY BEFORE BREAKFAST AND LUNCH, AND 30 UNITS BEFORE EVENING MEAL. Lantus solostar 100 units inject 45 units in the morning and 40 units at night Trulicity 1.5 mg inject inject syringe once weekly  What recent interventions/DTPs have been made to improve glycemic control:  None noted  Have there been any recent hospitalizations or ED visits since last visit with CPP? No  Patient denies hypoglycemic symptoms, including Pale, Sweaty, Shaky, Hungry, Nervous/irritable, and Vision changes  Patient reports hyperglycemic symptoms, including blurry vision and weakness       Patient states she had these symptoms about 1 month ago but feels better  How often are you checking your blood sugar? twice daily  What are your blood sugars ranging?  Fasting:  05/13/21:96 Before meals:  After meals:  Bedtime:   During the week, how often does your blood glucose drop below 70? Never  Are you checking your feet daily/regularly?  Patient states she has does check her feet daily and have been fine.  Patient did not receive a phone call from clinical pharmacist when she had the appointment scheduled on 03/09/21. I have rescheduled it for 06/29/21 @ 2pm  Adherence Review: Is the patient currently on a STATIN medication? Yes Is the patient currently on ACE/ARB medication? Yes Does the patient have >5 day gap between last estimated fill dates? No   Care Gaps: Zoster Vaccines- Shingrix:Never done COVID-19 Vaccine:Last completed: Oct 08, 2020 INFLUENZA VACCINE:Last completed: Feb 28, 2020  Star Rating Drugs: Atorvastatin 80 mg Last filled:04/09/21 90 DS Lisinopril 20 mg Last filled:03/30/21 90 DS Trulciity 1.5 mg Last filled:04/17/21 28 DS  Myriam Elta [place], [place]

## 2021-06-02 ENCOUNTER — Telehealth: Payer: Self-pay | Admitting: Gastroenterology

## 2021-06-02 ENCOUNTER — Other Ambulatory Visit: Payer: Self-pay

## 2021-06-02 ENCOUNTER — Encounter: Payer: Self-pay | Admitting: Gastroenterology

## 2021-06-02 DIAGNOSIS — Z860100 Personal history of colon polyps, unspecified: Secondary | ICD-10-CM

## 2021-06-02 MED ORDER — PEG 3350-KCL-NA BICARB-NACL 420 G PO SOLR: 4000.0000 mL | Freq: Once | ORAL | 0 refills | Status: AC

## 2021-06-02 NOTE — Telephone Encounter (Signed)
COLONOSCOPY (Pts 45-55yrs Insurance coverage will need to be confirmed) (Every 5 Years)  Next due on 11/12/2021   Patient is ready to schedule procedure

## 2021-06-02 NOTE — Progress Notes (Signed)
Gastroenterology Pre-Procedure Review  Request Date: 06/15/2021 Requesting Physician: Dr. Allen Norris  PATIENT REVIEW QUESTIONS: The patient responded to the following health history questions as indicated:    1. Are you having any GI issues? no 2. Do you have a personal history of Polyps? yes (last colonoscopy) 3. Do you have a family history of Colon Cancer or Polyps? no 4. Diabetes Mellitus? yes (type 2) 5. Joint replacements in the past 12 months?no 6. Major health problems in the past 3 months?kidney disease but under control 7. Any artificial heart valves, MVP, or defibrillator?no    MEDICATIONS & ALLERGIES:    Patient reports the following regarding taking any anticoagulation/antiplatelet therapy:   Plavix, Coumadin, Eliquis, Xarelto, Lovenox, Pradaxa, Brilinta, or Effient? no Aspirin? no  Patient confirms/reports the following medications:  Current Outpatient Medications  Medication Sig Dispense Refill   albuterol (VENTOLIN HFA) 108 (90 Base) MCG/ACT inhaler Inhale 2 puffs into the lungs every 6 (six) hours as needed for wheezing or shortness of breath. 18 g 7   amLODipine (NORVASC) 2.5 MG tablet Take 2.5 mg by mouth daily.     aspirin EC 81 MG tablet Take 81 mg by mouth daily.     atorvastatin (LIPITOR) 80 MG tablet Take 1 tablet (80 mg total) by mouth daily. 90 tablet 4   BINAXNOW COVID-19 AG HOME TEST KIT Use as Directed on the Package     budesonide-formoterol (SYMBICORT) 160-4.5 MCG/ACT inhaler Inhale 2 puffs into the lungs 2 (two) times daily. 11 g 4   calcium-vitamin D (OSCAL WITH D) 500-200 MG-UNIT tablet Take 1 tablet by mouth.     fluticasone (FLONASE) 50 MCG/ACT nasal spray Use 2 spray(s) in each nostril once daily 16 g 4   insulin glargine (LANTUS SOLOSTAR) 100 UNIT/ML Solostar Pen INJECT 45 UNITS SUBCUTANEOUSLY IN MORNING AND 40 UNITS AT NIGHT. 30 mL 5   insulin lispro (HUMALOG) 100 UNIT/ML KwikPen INJECT 20 UNITS SUBCTUANEOUSLY BEFORE BREAKFAST AND LUNCH, AND 30 UNITS  BEFORE EVENING MEAL. 15 mL 5   levothyroxine (SYNTHROID) 88 MCG tablet Take 88 mcg by mouth every morning.     lisinopril (ZESTRIL) 30 MG tablet Take 30 mg by mouth daily.     meclizine (ANTIVERT) 12.5 MG tablet Take 1 tablet (12.5 mg total) by mouth 3 (three) times daily as needed for dizziness. 60 tablet 0   metoprolol tartrate (LOPRESSOR) 25 MG tablet Take 25 mg by mouth 2 (two) times daily.     montelukast (SINGULAIR) 10 MG tablet Take 1 tablet (10 mg total) by mouth at bedtime. 90 tablet 4   ONE TOUCH ULTRA TEST test strip 1 each by Other route 2 (two) times daily.      ONETOUCH DELICA LANCETS 97F MISC 2 (two) times daily.      SEMGLEE, YFGN, 100 UNIT/ML Pen INJECT 40 UNITS SUBCUTANEOUSLY TWICE DAILY 30 mL 4   TRULICITY 1.5 SF/4.2LT SOPN INJECT 1 SYRINGE SUBCUTANEOUSLY ONCE A WEEK 6 mL 4   No current facility-administered medications for this visit.    Patient confirms/reports the following allergies:  Allergies  Allergen Reactions   Celebrex [Celecoxib]     hallucinations   Sulfa Antibiotics    Elemental Sulfur Itching   Sulfisoxazole Rash    No orders of the defined types were placed in this encounter.   AUTHORIZATION INFORMATION Primary Insurance: 1D#: Group #:  Secondary Insurance: 1D#: Group #:  SCHEDULE INFORMATION: Date: 06/15/2021 Time: Location: [facility]

## 2021-06-02 NOTE — Telephone Encounter (Signed)
Inbound call from pt requesting a call back to schedule her colonoscopy. Thank you.

## 2021-06-05 ENCOUNTER — Encounter: Payer: Self-pay | Admitting: Nurse Practitioner

## 2021-06-05 ENCOUNTER — Other Ambulatory Visit: Payer: Self-pay

## 2021-06-05 ENCOUNTER — Ambulatory Visit (INDEPENDENT_AMBULATORY_CARE_PROVIDER_SITE_OTHER): Payer: Medicare Other | Admitting: Nurse Practitioner

## 2021-06-05 VITALS — BP 130/76 | HR 76 | Temp 98.5°F | Ht 64.5 in | Wt 177.4 lb

## 2021-06-05 DIAGNOSIS — E0822 Diabetes mellitus due to underlying condition with diabetic chronic kidney disease: Secondary | ICD-10-CM | POA: Diagnosis not present

## 2021-06-05 DIAGNOSIS — E785 Hyperlipidemia, unspecified: Secondary | ICD-10-CM | POA: Diagnosis not present

## 2021-06-05 DIAGNOSIS — E1169 Type 2 diabetes mellitus with other specified complication: Secondary | ICD-10-CM

## 2021-06-05 DIAGNOSIS — J4520 Mild intermittent asthma, uncomplicated: Secondary | ICD-10-CM

## 2021-06-05 DIAGNOSIS — N1831 Chronic kidney disease, stage 3a: Secondary | ICD-10-CM

## 2021-06-05 DIAGNOSIS — E1122 Type 2 diabetes mellitus with diabetic chronic kidney disease: Secondary | ICD-10-CM | POA: Diagnosis not present

## 2021-06-05 DIAGNOSIS — Z794 Long term (current) use of insulin: Secondary | ICD-10-CM

## 2021-06-05 DIAGNOSIS — E890 Postprocedural hypothyroidism: Secondary | ICD-10-CM

## 2021-06-05 DIAGNOSIS — D729 Disorder of white blood cells, unspecified: Secondary | ICD-10-CM

## 2021-06-05 DIAGNOSIS — L219 Seborrheic dermatitis, unspecified: Secondary | ICD-10-CM | POA: Diagnosis not present

## 2021-06-05 DIAGNOSIS — K219 Gastro-esophageal reflux disease without esophagitis: Secondary | ICD-10-CM

## 2021-06-05 DIAGNOSIS — I129 Hypertensive chronic kidney disease with stage 1 through stage 4 chronic kidney disease, or unspecified chronic kidney disease: Secondary | ICD-10-CM

## 2021-06-05 DIAGNOSIS — E1140 Type 2 diabetes mellitus with diabetic neuropathy, unspecified: Secondary | ICD-10-CM

## 2021-06-05 DIAGNOSIS — R8281 Pyuria: Secondary | ICD-10-CM

## 2021-06-05 DIAGNOSIS — Z6830 Body mass index (BMI) 30.0-30.9, adult: Secondary | ICD-10-CM

## 2021-06-05 DIAGNOSIS — E559 Vitamin D deficiency, unspecified: Secondary | ICD-10-CM

## 2021-06-05 DIAGNOSIS — E6609 Other obesity due to excess calories: Secondary | ICD-10-CM

## 2021-06-05 LAB — MICROSCOPIC EXAMINATION: WBC, UA: NONE SEEN /hpf (ref 0–5)

## 2021-06-05 LAB — URINALYSIS, ROUTINE W REFLEX MICROSCOPIC
Bilirubin, UA: NEGATIVE
Ketones, UA: NEGATIVE
Leukocytes,UA: NEGATIVE
Nitrite, UA: NEGATIVE
Specific Gravity, UA: 1.025 (ref 1.005–1.030)
Urobilinogen, Ur: 0.2 mg/dL (ref 0.2–1.0)
pH, UA: 6 (ref 5.0–7.5)

## 2021-06-05 LAB — MICROALBUMIN, URINE WAIVED
Creatinine, Urine Waived: 200 mg/dL (ref 10–300)
Microalb, Ur Waived: 150 mg/L — ABNORMAL HIGH (ref 0–19)
Microalb/Creat Ratio: >300 mg/g — ABNORMAL HIGH (ref <30)

## 2021-06-05 LAB — BAYER DCA HB A1C WAIVED: HB A1C (BAYER DCA - WAIVED): 9.5 % — ABNORMAL HIGH (ref 4.8–5.6)

## 2021-06-05 MED ORDER — KETOCONAZOLE 2 % EX CREA: 1.0000 "application " | TOPICAL_CREAM | Freq: Every day | CUTANEOUS | 0 refills | Status: DC

## 2021-06-05 MED ORDER — PANTOPRAZOLE SODIUM 40 MG PO TBEC: 40.0000 mg | DELAYED_RELEASE_TABLET | Freq: Every day | ORAL | 4 refills | Status: DC

## 2021-06-05 MED ORDER — KETOCONAZOLE 2 % EX SHAM: 1.0000 "application " | MEDICATED_SHAMPOO | CUTANEOUS | 4 refills | Status: DC

## 2021-06-05 NOTE — Assessment & Plan Note (Signed)
S/P thyroidectomy years ago.  Followed by Dr. Rosario Jacks with endo.  Continue current medication regimen and adjust as needed.

## 2021-06-05 NOTE — Assessment & Plan Note (Signed)
Chronic, ongoing, followed by endocrinology.  A1C 9.5% today and urine ALB obtained -- have sent lab with patient to show Dr. Rosario Jacks at upcoming visit, will defer changes to them, but [facility] nephrology did recommend Trulicity increase.  Continue current medication regimen and collaboration with endocrinology.  CMP today, continue Lisinopril for kidney protection.  Recommend patient continue to monitor BS at home.  Return in 3 months.

## 2021-06-05 NOTE — Assessment & Plan Note (Signed)
Recommended eating smaller high protein, low fat meals more frequently and exercising 30 mins a day 5 times a week with a goal of 10-15lb weight loss in the next 3 months. Patient voiced their understanding and motivation to adhere to these recommendations.  

## 2021-06-05 NOTE — Assessment & Plan Note (Signed)
Noted mildly low WBC on past labs, check today and consider hematology referral if ongoing.

## 2021-06-05 NOTE — Assessment & Plan Note (Signed)
Chronic, stable on current medication regimen, with minimal use of Albuterol.  Consider spirometry in future.

## 2021-06-05 NOTE — Assessment & Plan Note (Signed)
Chronic, ongoing.  Continue current medication regimen and adjust as needed. Lipid panel today. 

## 2021-06-05 NOTE — Assessment & Plan Note (Signed)
Chronic, stable with BP at goal today.  Continue current medication regimen, Lisinopril for kidney protection with diabetes and collaboration with [facility] nephrology.  Recommend she monitor BP at home at least 3 mornings a week.  Obtain CMP and urine today.  Focus on DASH diet at home.  Return in 3 months.

## 2021-06-05 NOTE — Progress Notes (Signed)
BP 130/76    Pulse 76    Temp 98.5 F (36.9 C) (Oral)    Ht 5' 4.5" (1.638 m)    Wt 177 lb 6.4 oz (80.5 kg)    LMP  (LMP Unknown)    SpO2 98%    BMI 29.98 kg/m    Subjective:    Patient ID: Mary Parks, female    DOB: 12-27-1950, 71 y.o.   MRN: [id number]  HPI: Mary Parks is a 71 y.o. female  Chief Complaint  Patient presents with   Diabetes   Hyperlipidemia   Hypertension   Chronic Kidney Disease   Asthma   Gastroesophageal Reflux    Patient states every time she eats she is notices a bit of gas and discomfort in her stomach area. Patient states it radiates down and causes discomfort. Patient states she is currently taking Omeprazole and it doesn't help any longer.    Rash    Patient states she would like to have a spot looked at on her neck. Patient states she thinks it may be something her hairstylist is using to wash her hair and she says it has been there since around Summer time. Patient states she thought with Winter it would go away, but it didn't.    DIABETES Followed by Dr. Dario Guardian, saw him in August  -  sees next on 06/19/20.  A1C was 8.5% in September, do not have these records. Currently taking Trulicity 1.5 MG weekly and Lantus (40 units morning and 40 units at night) and Humalog 10 in morning, 10 lunch, and 6 in evening. Review of chart over past years  A1C has remained in 8-9 range.  Does endorse poor diet over holidays. Hypoglycemic episodes: none Polydipsia/polyuria: no Visual disturbance: no Chest pain: no Paresthesias: no Glucose Monitoring: yes             Accucheck frequency: BID             Fasting glucose: 101 - 130 range             Post prandial:             Evening: 200 range             Before meals: Taking Insulin?: yes             Long acting insulin: as above             Short acting insulin: as above Blood Pressure Monitoring: a few times a week Retinal Examination: Up To Date -- [facility] [place], Dr. Clearance Coots Foot Exam: Up to  Date Pneumovax: Up to Date Influenza: Up to Date Aspirin: yes  GERD Has noticed it over the past week or so -- with spicy foods and sparkling sodas.  She is taking OTC Omeprazole, has been on for a long time.  Scheduled for colonoscopy upcoming.  History of hiatal hernia being noticed. GERD control status: uncontrolled Satisfied with current treatment? yes Heartburn frequency: every time she eats Medication side effects: no  Medication compliance: worse Previous GERD medications: Omeprazole Antacid use frequency:  none Nature: burning in nature Location: epigastric to abdomen Heartburn duration: all day Alleviatiating factors:  nothing Aggravating factors: foods and sparkling drinks Dysphagia: no Odynophagia:  no Hematemesis: no Blood in stool: no EGD: yes   HYPOTHYROIDISM Followed by endocrinology, taking 88 MCG Levothyroxine.  Unable to view note in Epic.   Thyroid control status:stable Satisfied with current treatment? yes Medication side effects: no  Medication compliance: good compliance Etiology of hypothyroidism:  Recent dose adjustment:no Fatigue: no Cold intolerance: no Heat intolerance: no Weight gain: no Weight loss: no Constipation: no Diarrhea/loose stools: no Palpitations: no Lower extremity edema: no Anxiety/depressed mood: no   CHRONIC KIDNEY DISEASE Goes to nephrology at [facility], last saw in 03/26/21  -- Lisinopril was increased to 30 MG daily -- has visit next in May -- CRT 1.25 and eGFR 46.  She is concerned about urine today and would like checked for urine infection. CKD status: stable Medications renally dose: yes Previous renal evaluation: yes Pneumovax:  Up to Date Influenza Vaccine:  Up to Date   HYPERTENSION / HYPERLIPIDEMIA Current medications include Metoprolol, Lisinopril, and Amlodipine for HTN + Lipitor for HLD.   Satisfied with current treatment? yes Duration of hypertension: chronic BP monitoring frequency: occasional BP range:  120-130/80 BP medication side effects: no Duration of hyperlipidemia: chronic Cholesterol medication side effects: no Cholesterol supplements: none Medication compliance: good compliance Aspirin: yes Recent stressors: no Recurrent headaches: no Visual changes: no Palpitations: no Dyspnea: no Chest pain: no Lower extremity edema: no Dizzy/lightheaded: no   ASTHMA Continues Symbicort daily, Singulair, and Albuterol only as needed.  Non smoker.  Last WBC check continued to show slightly lower level. COPD status: stable Satisfied with current treatment?: yes Oxygen use: no Dyspnea frequency: none Cough frequency: none Rescue inhaler frequency: none Limitation of activity: no Productive cough: none Last Spirometry: unknown Pneumovax: Up to Date Influenza: Up to Date  RASH On the back of her neck.  Comes when she goes to hairdresser and worse in summer.  Has had similar in past. Duration:  chronic  Location: back of neck  Itching: yes Burning: no Redness: yes Oozing: no Scaling: yes Blisters: no Painful: no Fevers: no Change in detergents/soaps/personal care products: no Recent illness: no Recent travel:no History of same: yes Context: fluctuating Alleviating factors: nothing Treatments attempted:nothing Shortness of breath: no  Throat/tongue swelling: no Myalgias/arthralgias: no   Relevant past medical, surgical, family and social history reviewed and updated as indicated. Interim medical history since our last visit reviewed. Allergies and medications reviewed and updated.  Review of Systems  Constitutional:  Negative for activity change, appetite change, diaphoresis, fatigue and fever.  Respiratory:  Negative for cough, chest tightness and shortness of breath.   Cardiovascular:  Negative for chest pain, palpitations and leg swelling.  Gastrointestinal: Negative.   Endocrine: Negative for cold intolerance, heat intolerance, polydipsia, polyphagia and polyuria.   Neurological: Negative.   Psychiatric/Behavioral: Negative.     Per HPI unless specifically indicated above     Objective:    BP 130/76    Pulse 76    Temp 98.5 F (36.9 C) (Oral)    Ht 5' 4.5" (1.638 m)    Wt 177 lb 6.4 oz (80.5 kg)    LMP  (LMP Unknown)    SpO2 98%    BMI 29.98 kg/m   Wt Readings from Last 3 Encounters:  06/05/21 177 lb 6.4 oz (80.5 kg)  02/06/21 176 lb 9.6 oz (80.1 kg)  12/29/20 175 lb (79.4 kg)    Physical Exam Vitals and nursing note reviewed.  Constitutional:      General: She is awake. She is not in acute distress.    Appearance: She is well-developed and well-groomed. She is obese. She is not ill-appearing.  HENT:     Head: Normocephalic.     Right Ear: Hearing and external ear normal. No drainage.  Left Ear: Hearing and external ear normal. No drainage.  Eyes:     General: Lids are normal.        Right eye: No discharge.        Left eye: No discharge.     Conjunctiva/sclera: Conjunctivae normal.     Pupils: Pupils are equal, round, and reactive to light.  Neck:     Vascular: No carotid bruit.  Cardiovascular:     Rate and Rhythm: Normal rate and regular rhythm.     Heart sounds: Normal heart sounds.  Pulmonary:     Effort: Pulmonary effort is normal. No accessory muscle usage or respiratory distress.     Breath sounds: Normal breath sounds.  Abdominal:     General: Bowel sounds are normal. There is no distension.     Palpations: Abdomen is soft.     Tenderness: There is no right CVA tenderness, left CVA tenderness, guarding or rebound. Negative signs include Murphy's sign.     Hernia: A hernia is present. Hernia is present in the umbilical area.  Musculoskeletal:     Cervical back: Normal range of motion and neck supple.     Right lower leg: No edema.     Left lower leg: No edema.  Lymphadenopathy:     Cervical: No cervical adenopathy.  Skin:    General: Skin is warm and dry.  Neurological:     Mental Status: She is alert and  oriented to person, place, and time.     Motor: Motor function is intact.     Coordination: Coordination is intact.     Gait: Gait is intact.     Deep Tendon Reflexes:     Reflex Scores:      Brachioradialis reflexes are 2+ on the right side and 2+ on the left side.      Patellar reflexes are 2+ on the right side and 2+ on the left side. Psychiatric:        Attention and Perception: Attention normal.        Mood and Affect: Mood normal.        Speech: Speech normal.        Behavior: Behavior normal. Behavior is cooperative.        Thought Content: Thought content normal.   Diabetic Foot Exam - Simple   Simple Foot Form Visual Inspection No deformities, no ulcerations, no other skin breakdown bilaterally: Yes Sensation Testing Intact to touch and monofilament testing bilaterally: Yes Pulse Check Posterior Tibialis and Dorsalis pulse intact bilaterally: Yes Comments     Results for orders placed or performed in visit on 02/07/21  HM DIABETES EYE EXAM  Result Value Ref Range   HM Diabetic Eye Exam No Retinopathy No Retinopathy      Assessment & Plan:   Problem List Items Addressed This Visit       Cardiovascular and Mediastinum   Hypertension 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  Continue current medication regimen, Lisinopril for kidney protection with diabetes and collaboration with [facility] nephrology.  Recommend she monitor BP at home at least 3 mornings a week.  Obtain CMP and urine today.  Focus on DASH diet at home.  Return in 3 months.      Relevant Medications   lisinopril (ZESTRIL) 30 MG tablet   Other Relevant Orders   Bayer DCA Hb A1c Waived   Microalbumin, Urine Waived  Respiratory   Asthma    Chronic, stable on current medication regimen, with minimal use of Albuterol.  Consider spirometry in future.      Relevant Orders   CBC with Differential/Platelet     Digestive    GERD (gastroesophageal reflux disease)    Chronic and ongoing issue with poor response to OTC Omeprazole, at this time will change to Protonix 40 MG daily and adjust as needed.  Plan on checking Mag level annually.  If worsening or ongoing consider referral to GI.      Relevant Medications   polyethylene glycol-electrolytes (NULYTELY) 420 g solution   pantoprazole (PROTONIX) 40 MG tablet     Endocrine   Diabetes mellitus with chronic kidney disease ([place]) - Primary    Chronic, ongoing, followed by endocrinology.  A1C 9.5% today, recommend continued focus on diet.  Urine ALB 150 and A:C 30-300 February 2022, recheck today.  Continue current medication regimen and collaboration with endocrinology and nephrology. CMP today, continue Lisinopril for kidney protection.       Relevant Medications   lisinopril (ZESTRIL) 30 MG tablet   Other Relevant Orders   Bayer DCA Hb A1c Waived   Microalbumin, Urine Waived   Hyperlipidemia associated with type 2 diabetes mellitus (HCC)    Chronic, ongoing.  Continue current medication regimen and adjust as needed.  Lipid panel today.      Relevant Medications   lisinopril (ZESTRIL) 30 MG tablet   Other Relevant Orders   Bayer DCA Hb A1c Waived   Lipid Panel w/o Chol/HDL Ratio   Hypothyroidism    S/P thyroidectomy years ago.  Followed by Dr. Rosario Jacks with endo.  Continue current medication regimen and adjust as needed.         Relevant Orders   T4, free   TSH   Type 2 diabetes mellitus with diabetic neuropathy, with long-term current use of insulin (HCC)    Chronic, ongoing, followed by endocrinology.  A1C 9.5% today and urine ALB obtained -- have sent lab with patient to show Dr. Rosario Jacks at upcoming visit, will defer changes to them, but [facility] nephrology did recommend Trulicity increase.  Continue current medication regimen and collaboration with endocrinology.  CMP today, continue Lisinopril for kidney protection.  Recommend patient continue to monitor BS  at home.  Return in 3 months.      Relevant Medications   lisinopril (ZESTRIL) 30 MG tablet   Other Relevant Orders   Bayer DCA Hb A1c Waived   Microalbumin, Urine Waived     Musculoskeletal and Integument   Seborrheic dermatitis    Chronic, with acute flares.  Ketoconazole shampoo and cream sent for treatment.  Adjust regimen as needed.        Genitourinary   Chronic kidney disease, stage 3a (HCC)    Chronic, ongoing.  Continue collaboration with [facility] nephrology.  Lisinopril for kidney protection. CMP today.      Relevant Orders   Microalbumin, Urine Waived   Urinalysis, Routine w reflex microscopic     Other   Abnormal white blood cell count    Noted mildly low WBC on past labs, check today and consider hematology referral if ongoing.      Relevant Orders   CBC with Differential/Platelet   C-reactive protein   Sed Rate (ESR)   Obesity    Recommended eating smaller high protein, low fat meals more frequently and exercising 30 mins a day 5 times a week with a goal of 10-15lb weight loss in the  next 3 months. Patient voiced their understanding and motivation to adhere to these recommendations.       Other Visit Diagnoses     Vitamin D deficiency       History of low levels reported, check today and initiate supplement as needed.   Relevant Orders   VITAMIN D 25 Hydroxy (Vit-D Deficiency, Fractures)   Pyuria       Noted on urine check today, send for culture and treat as needed based on findings.   Relevant Orders   Urine Culture        Follow up plan: Return in about 3 months (around 09/03/2021) for T2DM, HTN/HLD, GERD, THYROID.

## 2021-06-05 NOTE — Assessment & Plan Note (Signed)
Chronic, ongoing, followed by endocrinology.  A1C 9.5% today, recommend continued focus on diet.  Urine ALB 150 and A:C 30-300 February 2022, recheck today.  Continue current medication regimen and collaboration with endocrinology and nephrology. CMP today, continue Lisinopril for kidney protection.

## 2021-06-05 NOTE — Assessment & Plan Note (Signed)
Chronic, with acute flares.  Ketoconazole shampoo and cream sent for treatment.  Adjust regimen as needed.

## 2021-06-05 NOTE — Patient Instructions (Signed)

## 2021-06-05 NOTE — Assessment & Plan Note (Signed)
Chronic and ongoing issue with poor response to OTC Omeprazole, at this time will change to Protonix 40 MG daily and adjust as needed.  Plan on checking Mag level annually.  If worsening or ongoing consider referral to GI.

## 2021-06-05 NOTE — Assessment & Plan Note (Signed)
Chronic, ongoing.  Continue collaboration with [facility] nephrology.  Lisinopril for kidney protection. CMP today.

## 2021-06-06 ENCOUNTER — Encounter: Payer: Self-pay | Admitting: Nurse Practitioner

## 2021-06-06 DIAGNOSIS — R700 Elevated erythrocyte sedimentation rate: Secondary | ICD-10-CM | POA: Insufficient documentation

## 2021-06-06 LAB — CBC WITH DIFFERENTIAL/PLATELET
Basophils Absolute: 0 10*3/uL (ref 0.0–0.2)
Basos: 1 %
EOS (ABSOLUTE): 0.1 10*3/uL (ref 0.0–0.4)
Eos: 3 %
Hematocrit: 38.7 % (ref 34.0–46.6)
Hemoglobin: 12.5 g/dL (ref 11.1–15.9)
Immature Grans (Abs): 0 10*3/uL (ref 0.0–0.1)
Immature Granulocytes: 0 %
Lymphocytes Absolute: 1 10*3/uL (ref 0.7–3.1)
Lymphs: 27 %
MCH: 27.2 pg (ref 26.6–33.0)
MCHC: 32.3 g/dL (ref 31.5–35.7)
MCV: 84 fL (ref 79–97)
Monocytes Absolute: 0.3 10*3/uL (ref 0.1–0.9)
Monocytes: 7 %
Neutrophils Absolute: 2.3 10*3/uL (ref 1.4–7.0)
Neutrophils: 62 %
Platelets: 194 10*3/uL (ref 150–450)
RBC: 4.6 x10E6/uL (ref 3.77–5.28)
RDW: 13.5 % (ref 11.7–15.4)
WBC: 3.6 10*3/uL (ref 3.4–10.8)

## 2021-06-06 LAB — TSH: TSH: 0.727 u[IU]/mL (ref 0.450–4.500)

## 2021-06-06 LAB — LIPID PANEL W/O CHOL/HDL RATIO
Cholesterol, Total: 145 mg/dL (ref 100–199)
HDL: 42 mg/dL (ref >39)
LDL Chol Calc (NIH): 84 mg/dL (ref 0–99)
Triglycerides: 104 mg/dL (ref 0–149)
VLDL Cholesterol Cal: 19 mg/dL (ref 5–40)

## 2021-06-06 LAB — SEDIMENTATION RATE: Sed Rate: 52 mm/hr — ABNORMAL HIGH (ref 0–40)

## 2021-06-06 LAB — C-REACTIVE PROTEIN: CRP: 2 mg/L (ref 0–10)

## 2021-06-06 LAB — VITAMIN D 25 HYDROXY (VIT D DEFICIENCY, FRACTURES): Vit D, 25-Hydroxy: 33.4 ng/mL (ref 30.0–100.0)

## 2021-06-06 LAB — T4, FREE: Free T4: 1.5 ng/dL (ref 0.82–1.77)

## 2021-06-06 NOTE — Progress Notes (Signed)
Good morning crew, please let Mary Parks know her labs have returned and are overall normal with exception of the ESR, one of the inflammatory markers, which is mildly elevated.  We will recheck this at next visit.  Remainder of labs are stable and no medication changes needed.  Great news!!  Any questions? °Keep being amazing!!  Thank you for allowing me to participate in your care.  I appreciate you. °Kindest regards, °Jolene °

## 2021-06-07 LAB — URINE CULTURE

## 2021-06-07 NOTE — Progress Notes (Signed)
Please let Mary Parks know her urine has no infection:)

## 2021-06-13 DIAGNOSIS — Z794 Long term (current) use of insulin: Secondary | ICD-10-CM | POA: Diagnosis not present

## 2021-06-13 DIAGNOSIS — E1165 Type 2 diabetes mellitus with hyperglycemia: Secondary | ICD-10-CM | POA: Diagnosis not present

## 2021-06-15 ENCOUNTER — Encounter: Payer: Self-pay | Admitting: Gastroenterology

## 2021-06-15 ENCOUNTER — Ambulatory Visit: Payer: Medicare Other | Admitting: Anesthesiology

## 2021-06-15 ENCOUNTER — Ambulatory Visit
Admission: RE | Admit: 2021-06-15 | Discharge: 2021-06-15 | Disposition: A | Discharge status: Home / Self Care | Payer: Medicare Other | Attending: Gastroenterology | Admitting: Gastroenterology

## 2021-06-15 ENCOUNTER — Other Ambulatory Visit: Payer: Self-pay

## 2021-06-15 ENCOUNTER — Encounter
Admission: RE | Disposition: A | Discharge status: Home / Self Care | Payer: Self-pay | Source: Home / Self Care | Attending: Gastroenterology

## 2021-06-15 DIAGNOSIS — J45909 Unspecified asthma, uncomplicated: Secondary | ICD-10-CM | POA: Diagnosis not present

## 2021-06-15 DIAGNOSIS — K219 Gastro-esophageal reflux disease without esophagitis: Secondary | ICD-10-CM | POA: Diagnosis not present

## 2021-06-15 DIAGNOSIS — K641 Second degree hemorrhoids: Secondary | ICD-10-CM | POA: Insufficient documentation

## 2021-06-15 DIAGNOSIS — E119 Type 2 diabetes mellitus without complications: Secondary | ICD-10-CM | POA: Diagnosis not present

## 2021-06-15 DIAGNOSIS — K635 Polyp of colon: Secondary | ICD-10-CM | POA: Diagnosis not present

## 2021-06-15 DIAGNOSIS — D125 Benign neoplasm of sigmoid colon: Secondary | ICD-10-CM | POA: Insufficient documentation

## 2021-06-15 DIAGNOSIS — Z860100 Personal history of colon polyps, unspecified: Secondary | ICD-10-CM

## 2021-06-15 DIAGNOSIS — E039 Hypothyroidism, unspecified: Secondary | ICD-10-CM | POA: Insufficient documentation

## 2021-06-15 DIAGNOSIS — Z1211 Encounter for screening for malignant neoplasm of colon: Secondary | ICD-10-CM | POA: Insufficient documentation

## 2021-06-15 DIAGNOSIS — I10 Essential (primary) hypertension: Secondary | ICD-10-CM | POA: Insufficient documentation

## 2021-06-15 HISTORY — DX: Hypothyroidism, unspecified: E03.9

## 2021-06-15 HISTORY — PX: COLONOSCOPY WITH PROPOFOL: SHX5780

## 2021-06-15 HISTORY — PX: POLYPECTOMY: SHX5525

## 2021-06-15 LAB — GLUCOSE, CAPILLARY
Glucose-Capillary: 106 mg/dL — ABNORMAL HIGH (ref 70–99)
Glucose-Capillary: 115 mg/dL — ABNORMAL HIGH (ref 70–99)

## 2021-06-15 SURGERY — COLONOSCOPY WITH PROPOFOL
Anesthesia: General | Site: Rectum

## 2021-06-15 MED ORDER — LACTATED RINGERS IV SOLN: INTRAVENOUS | Status: DC

## 2021-06-15 MED ORDER — ACETAMINOPHEN 160 MG/5ML PO SOLN: 325.0000 mg | ORAL | Status: DC | PRN

## 2021-06-15 MED ORDER — STERILE WATER FOR IRRIGATION IR SOLN
Status: DC | PRN
  Administered 2021-06-15: 1

## 2021-06-15 MED ORDER — LIDOCAINE HCL (CARDIAC) PF 100 MG/5ML IV SOSY
PREFILLED_SYRINGE | INTRAVENOUS | Status: DC | PRN
  Administered 2021-06-15: 30 mg via INTRAVENOUS

## 2021-06-15 MED ORDER — SODIUM CHLORIDE 0.9 % IV SOLN: INTRAVENOUS | Status: DC

## 2021-06-15 MED ORDER — ACETAMINOPHEN 325 MG PO TABS: 325.0000 mg | ORAL_TABLET | ORAL | Status: DC | PRN

## 2021-06-15 MED ORDER — PROPOFOL 10 MG/ML IV BOLUS
INTRAVENOUS | Status: DC | PRN
  Administered 2021-06-15: 40 mg via INTRAVENOUS
  Administered 2021-06-15: 20 mg via INTRAVENOUS
  Administered 2021-06-15: 100 mg via INTRAVENOUS
  Administered 2021-06-15 (×2): 20 mg via INTRAVENOUS
  Administered 2021-06-15: 40 mg via INTRAVENOUS
  Administered 2021-06-15: 20 mg via INTRAVENOUS

## 2021-06-15 SURGICAL SUPPLY — 8 items
GOWN CVR UNV OPN BCK APRN NK (MISCELLANEOUS) ×2 IMPLANT
GOWN ISOL THUMB LOOP REG UNIV (MISCELLANEOUS) ×6
KIT PRC NS LF DISP ENDO (KITS) ×1 IMPLANT
KIT PROCEDURE OLYMPUS (KITS) ×3
MANIFOLD NEPTUNE II (INSTRUMENTS) ×3 IMPLANT
SNARE COLD EXACTO (MISCELLANEOUS) ×2 IMPLANT
TRAP ETRAP POLY (MISCELLANEOUS) ×2 IMPLANT
WATER STERILE IRR 250ML POUR (IV SOLUTION) ×3 IMPLANT

## 2021-06-15 NOTE — H&P (Signed)
Mary Lame, MD [facility] [address] [place], [place] [address] [phone] Fax : [phone]  Primary Care Physician:  Venita Lick, NP Primary Gastroenterologist:  Dr. Allen Norris  Pre-Procedure History & Physical: HPI:  Mary Parks is a 71 y.o. female is here for an colonoscopy.   Past Medical History:  Diagnosis Date   Asthma    Chest pain    DM2 (diabetes mellitus, type 2) ([place]) 1987   Dyspnea    Cath normal coronaries. normal EF. RA 11. 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/14/15   Vertigo    none recently    Past Surgical History:  Procedure Laterality Date   CARDIAC CATHETERIZATION     CATARACT EXTRACTION W/PHACO Right 03/31/2015   Procedure: CATARACT EXTRACTION PHACO AND INTRAOCULAR LENS PLACEMENT ([place]);  Surgeon: Ronnell Freshwater, MD;  Location: [place];  Service: Ophthalmology;  Laterality: Right;  DIABETIC - insulin   COLONOSCOPY WITH PROPOFOL N/A 11/12/2016   Procedure: COLONOSCOPY WITH PROPOFOL;  Surgeon: Mary Lame, MD;  Location: [place];  Service: Endoscopy;  Laterality: N/A;  Diabetic - insulin   EYE SURGERY     HERNIA REPAIR     umbilical   POLYPECTOMY  11/12/2016   Procedure: POLYPECTOMY INTESTINAL;  Surgeon: Mary Lame, MD;  Location: [place];  Service: Endoscopy;;   THYROIDECTOMY N/A 04/14/2015   Procedure: THYROIDECTOMY;  Surgeon: Margaretha Sheffield, MD;  Location: [facility];  Service: ENT;  Laterality: N/A;    Prior to Admission medications   Medication Sig Start Date End Date Taking? Authorizing Provider  albuterol (VENTOLIN HFA) 108 (90 Base) MCG/ACT inhaler Inhale 2 puffs into the lungs every 6 (six) hours as needed for wheezing or shortness of breath. 01/02/20  Yes Cannady, Jolene T, NP  amLODipine  (NORVASC) 2.5 MG tablet Take 2.5 mg by mouth daily. 12/06/18  Yes [provider]  aspirin EC 81 MG tablet Take 81 mg by mouth daily.   Yes [provider]  atorvastatin (LIPITOR) 80 MG tablet Take 1 tablet (80 mg total) by mouth daily. 02/06/21  Yes Cannady, Jolene T, NP  budesonide-formoterol (SYMBICORT) 160-4.5 MCG/ACT inhaler Inhale 2 puffs into the lungs 2 (two) times daily. 02/06/21  Yes Cannady, Jolene T, NP  calcium-vitamin D (OSCAL WITH D) 500-200 MG-UNIT tablet Take 1 tablet by mouth.   Yes [provider]  fluticasone (FLONASE) 50 MCG/ACT nasal spray Use 2 spray(s) in each nostril once daily 05/04/21  Yes Cannady, Jolene T, NP  insulin glargine (LANTUS SOLOSTAR) 100 UNIT/ML Solostar Pen INJECT 45 UNITS SUBCUTANEOUSLY IN MORNING AND 40 UNITS AT NIGHT. 02/06/21  Yes Cannady, Jolene T, NP  insulin lispro (HUMALOG) 100 UNIT/ML KwikPen INJECT 20 UNITS SUBCTUANEOUSLY BEFORE BREAKFAST AND LUNCH, AND 30 UNITS BEFORE EVENING MEAL. 03/30/21  Yes Cannady, Henrine Screws T, NP  levothyroxine (SYNTHROID) 88 MCG tablet Take 88 mcg by mouth every morning. 08/13/19  Yes [provider]  lisinopril (ZESTRIL) 30 MG tablet Take 30 mg by mouth daily. 03/30/21  Yes [provider]  meclizine (ANTIVERT) 12.5 MG tablet Take 1 tablet (12.5 mg total) by mouth 3 (three) times daily as needed for dizziness. 02/06/21  Yes [name] T, NP  metoprolol tartrate (  LOPRESSOR) 25 MG tablet Take 25 mg by mouth 2 (two) times daily. 07/09/16  Yes [provider]  montelukast (SINGULAIR) 10 MG tablet Take 1 tablet (10 mg total) by mouth at bedtime. 02/06/21  Yes Cannady, Jolene T, NP  pantoprazole (PROTONIX) 40 MG tablet Take 1 tablet (40 mg total) by mouth daily. 06/05/21  Yes Cannady, Jolene T, NP  SEMGLEE, YFGN, 100 UNIT/ML Pen INJECT 40 UNITS SUBCUTANEOUSLY TWICE DAILY 01/09/21  Yes Cannady, Jolene T, NP  TRULICITY 1.5 JK/9.3OI SOPN INJECT 1 SYRINGE SUBCUTANEOUSLY ONCE A WEEK 01/05/21   Yes Cannady, Jolene T, NP  BINAXNOW COVID-19 AG HOME TEST KIT Use as Directed on the Package 01/30/21   [provider]  ketoconazole (NIZORAL) 2 % cream Apply 1 application topically daily. 06/05/21   Cannady, Henrine Screws T, NP  ketoconazole (NIZORAL) 2 % shampoo Apply 1 application topically 2 (two) times a week. 06/08/21   Cannady, Henrine Screws T, NP  ONE TOUCH ULTRA TEST test strip 1 each by Other route 2 (two) times daily.  03/15/15   [provider]  Jonetta Speak LANCETS 71I MISC 2 (two) times daily.  03/15/15   [provider]  polyethylene glycol-electrolytes (NULYTELY) 420 g solution Take by mouth. 06/02/21   [provider]    Allergies as of 06/02/2021 - Review Complete 06/02/2021  Allergen Reaction Noted   Celebrex [celecoxib]  11/04/2014   Sulfa antibiotics  11/23/2014   Elemental sulfur Itching 11/05/2014   Sulfisoxazole Rash 11/04/2014    Family History  Problem Relation Age of Onset   Diabetes Mother    Diabetes Father    Diabetes Sister    Diabetes Brother    Diabetes Sister    Diabetes Sister    Diabetes Maternal Grandmother    Diabetes Maternal Grandfather    Diabetes Paternal Grandmother    Diabetes Paternal Grandfather    Heart disease Neg Hx    Coronary artery disease Neg Hx    Breast cancer Neg Hx     Social History   Socioeconomic History   Marital status: Widowed    Spouse name: Not on file   Number of children: Not on file   Years of education: Not on file   Highest education level: Some college, no degree  Occupational History   Occupation:  [employer], 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    Fish farm manager: OTHER   Occupation: retired  Tobacco Use   Smoking status: Never   Smokeless tobacco: Never  Scientific laboratory technician Use: Never used  Substance and Sexual Activity   Alcohol use: No   Drug use: No   Sexual activity: Not Currently  Other Topics Concern   Not on file  Social History Narrative   Not on file   Social  Determinants of Health   Financial Resource Strain: Low Risk    Difficulty of Paying Living Expenses: Not hard at all  Food Insecurity: No Food Insecurity   Worried About Charity fundraiser in the Last Year: Never true   [place] in the Last Year: Never true  Transportation Needs: No Transportation Needs   Lack of Transportation (Medical): No   Lack of Transportation (Non-Medical): No  Physical Activity: Inactive   Days of Exercise per Week: 0 days   Minutes of Exercise per Session: 0 min  Stress: No Stress Concern Present   Feeling of Stress : Not at all  Social Connections: Not on file  Intimate Partner Violence: Not on  file    Review of Systems: See HPI, otherwise negative ROS  Physical Exam: BP 137/77    Pulse 68    Temp (!) 97.2 F (36.2 C) (Temporal)    Resp 18    Ht 5' 4.5" (1.638 m)    Wt 80.7 kg    LMP  (LMP Unknown)    SpO2 100%    BMI 30.08 kg/m  General:   Alert,  pleasant and cooperative in NAD Head:  Normocephalic and atraumatic. Neck:  Supple; no masses or thyromegaly. Lungs:  Clear throughout to auscultation.    Heart:  Regular rate and rhythm. Abdomen:  Soft, nontender and nondistended. Normal bowel sounds, without guarding, and without rebound.   Neurologic:  Alert and  oriented x4;  grossly normal neurologically.  Impression/Plan: Mary Parks is here for an colonoscopy to be performed for a history of adenomatous polyps on 2018   Risks, benefits, limitations, and alternatives regarding  colonoscopy have been reviewed with the patient.  Questions have been answered.  All parties agreeable.   Mary Lame, MD  06/15/2021, 7:16 AM

## 2021-06-15 NOTE — Op Note (Signed)
[facility] Gastroenterology Patient Name: Mary Parks Procedure Date: 06/15/2021 7:22 AM MRN: [id number] Account #: [id number] Date of Birth: November 01, 1950 Admit Type: Outpatient Age: 71 Room: [facility] OR ROOM 01 Gender: Female Note Status: Finalized Instrument Name: [id number] Procedure:             Colonoscopy Indications:           High risk colon cancer surveillance: Personal history                         of colonic polyps Providers:             [name] MD, MD Referring MD:          Barbaraann Faster. Cannady (Referring MD) Medicines:             Propofol per Anesthesia Complications:         No immediate complications. Procedure:             Pre-Anesthesia Assessment:                        - Prior to the procedure, a History and Physical was                         performed, and patient medications and allergies were                         reviewed. The patient's tolerance of previous                         anesthesia was also reviewed. The risks and benefits                         of the procedure and the sedation options and risks                         were discussed with the patient. All questions were                         answered, and informed consent was obtained. Prior                         Anticoagulants: The patient has taken no previous                         anticoagulant or antiplatelet agents. ASA Grade                         Assessment: II - A patient with mild systemic disease.                         After reviewing the risks and benefits, the patient                         was deemed in satisfactory condition to undergo the                         procedure.  After obtaining informed consent, the colonoscope was                         passed under direct vision. Throughout the procedure,                         the patient's blood pressure, pulse, and oxygen                         saturations were monitored  continuously. The                         Colonoscope was introduced through the anus and                         advanced to the the cecum, identified by appendiceal                         orifice and ileocecal valve. The colonoscopy was                         performed without difficulty. The patient tolerated                         the procedure well. The quality of the bowel                         preparation was excellent. Findings:      The perianal and digital rectal examinations were normal.      Two sessile polyps were found in the sigmoid colon. The polyps were 2 to       4 mm in size. These polyps were removed with a cold snare. Resection and       retrieval were complete.      A 3 mm polyp was found in the descending colon. The polyp was sessile.       The polyp was removed with a cold snare. Resection and retrieval were       complete.      Non-bleeding internal hemorrhoids were found during retroflexion. The       hemorrhoids were Grade II (internal hemorrhoids that prolapse but reduce       spontaneously). Impression:            - Two 2 to 4 mm polyps in the sigmoid colon, removed                         with a cold snare. Resected and retrieved.                        - One 3 mm polyp in the descending colon, removed with                         a cold snare. Resected and retrieved.                        - Non-bleeding internal hemorrhoids. Recommendation:        - Discharge patient to home.                        -  Resume previous diet.                        - Continue present medications.                        - Await pathology results.                        - Repeat colonoscopy is not recommended for                         surveillance. Procedure Code(s):     --- Professional ---                        [phone], Colonoscopy, flexible; with removal of                         tumor(s), polyp(s), or other lesion(s) by snare                          technique Diagnosis Code(s):     --- Professional ---                        Z86.010, Personal history of colonic polyps                        K63.5, Polyp of colon CPT copyright 2019 American Medical Association. All rights reserved. The codes documented in this report are preliminary and upon coder review may  be revised to meet current compliance requirements. [name] MD, MD 06/15/2021 7:57:06 AM This report has been signed electronically. Number of Addenda: 0 Note Initiated On: 06/15/2021 7:22 AM Scope Withdrawal Time: 0 hours 10 minutes 30 seconds  Total Procedure Duration: 0 hours 12 minutes 57 seconds  Estimated Blood Loss:  Estimated blood loss: none.      [facility]

## 2021-06-15 NOTE — Transfer of Care (Signed)
Immediate Anesthesia Transfer of Care Note  Patient: Mary Parks  Procedure(s) Performed: COLONOSCOPY WITH BIOPSY (Rectum)  Patient Location: PACU  Anesthesia Type: General  Level of Consciousness: awake, alert  and patient cooperative  Airway and Oxygen Therapy: Patient Spontanous Breathing and Patient connected to supplemental oxygen  Post-op Assessment: Post-op Vital signs reviewed, Patient's Cardiovascular Status Stable, Respiratory Function Stable, Patent Airway and No signs of Nausea or vomiting  Post-op Vital Signs: Reviewed and stable  Complications: No notable events documented.

## 2021-06-15 NOTE — Anesthesia Preprocedure Evaluation (Signed)
Anesthesia Evaluation  Patient identified by MRN, date of birth, ID band Patient awake    Reviewed: Allergy & Precautions, H&P , NPO status , Patient's Chart, lab work & pertinent test results, reviewed documented beta blocker date and time   Airway Mallampati: II  TM Distance: >3 FB Neck ROM: full    Dental no notable dental hx.    Pulmonary shortness of breath, asthma ,    Pulmonary exam normal breath sounds clear to auscultation       Cardiovascular Exercise Tolerance: Good hypertension, Normal cardiovascular exam Rhythm:regular Rate:Normal  Cardiac cath WNL   Neuro/Psych negative neurological ROS  negative psych ROS   GI/Hepatic Neg liver ROS, GERD  ,No reflux issue today   Endo/Other  diabetes, Type 2Hypothyroidism   Renal/GU negative Renal ROS  negative genitourinary   Musculoskeletal   Abdominal   Peds  Hematology negative hematology ROS (+)   Anesthesia Other Findings   Reproductive/Obstetrics negative OB ROS                             Anesthesia Physical Anesthesia Plan  ASA: 2  Anesthesia Plan: General   Post-op Pain Management:    Induction:   PONV Risk Score and Plan:   Airway Management Planned:   Additional Equipment:   Intra-op Plan:   Post-operative Plan:   Informed Consent: I have reviewed the patients History and Physical, chart, labs and discussed the procedure including the risks, benefits and alternatives for the proposed anesthesia with the patient or authorized representative who has indicated his/her understanding and acceptance.     Dental Advisory Given  Plan Discussed with: CRNA and Anesthesiologist  Anesthesia Plan Comments:         Anesthesia Quick Evaluation

## 2021-06-15 NOTE — Anesthesia Postprocedure Evaluation (Signed)
Anesthesia Post Note  Patient: Mary Parks  Procedure(s) Performed: COLONOSCOPY WITH BIOPSY (Rectum) POLYPECTOMY (Rectum)     Patient location during evaluation: PACU Anesthesia Type: General Level of consciousness: awake and alert Pain management: pain level controlled Vital Signs Assessment: post-procedure vital signs reviewed and stable Respiratory status: spontaneous breathing, nonlabored ventilation, respiratory function stable and patient connected to nasal cannula oxygen Cardiovascular status: blood pressure returned to baseline and stable Postop Assessment: no apparent nausea or vomiting Anesthetic complications: no   No notable events documented.  Trecia Rogers

## 2021-06-15 NOTE — Anesthesia Procedure Notes (Signed)
Date/Time: 06/15/2021 7:38 AM Performed by: Cameron Ali, CRNA Pre-anesthesia Checklist: Patient identified, Emergency Drugs available, Suction available, Timeout performed and Patient being monitored Patient Re-evaluated:Patient Re-evaluated prior to induction Oxygen Delivery Method: Nasal cannula Placement Confirmation: positive ETCO2

## 2021-06-16 ENCOUNTER — Encounter: Payer: Self-pay | Admitting: Gastroenterology

## 2021-06-16 LAB — SURGICAL PATHOLOGY

## 2021-06-19 DIAGNOSIS — K219 Gastro-esophageal reflux disease without esophagitis: Secondary | ICD-10-CM | POA: Diagnosis not present

## 2021-06-19 DIAGNOSIS — T7840XD Allergy, unspecified, subsequent encounter: Secondary | ICD-10-CM | POA: Diagnosis not present

## 2021-06-19 DIAGNOSIS — E559 Vitamin D deficiency, unspecified: Secondary | ICD-10-CM | POA: Diagnosis not present

## 2021-06-19 DIAGNOSIS — E039 Hypothyroidism, unspecified: Secondary | ICD-10-CM | POA: Diagnosis not present

## 2021-06-19 DIAGNOSIS — E785 Hyperlipidemia, unspecified: Secondary | ICD-10-CM | POA: Diagnosis not present

## 2021-06-19 DIAGNOSIS — E1165 Type 2 diabetes mellitus with hyperglycemia: Secondary | ICD-10-CM | POA: Diagnosis not present

## 2021-06-19 DIAGNOSIS — N289 Disorder of kidney and ureter, unspecified: Secondary | ICD-10-CM | POA: Diagnosis not present

## 2021-06-19 DIAGNOSIS — I10 Essential (primary) hypertension: Secondary | ICD-10-CM | POA: Diagnosis not present

## 2021-06-25 DIAGNOSIS — I10 Essential (primary) hypertension: Secondary | ICD-10-CM | POA: Diagnosis not present

## 2021-06-25 DIAGNOSIS — E785 Hyperlipidemia, unspecified: Secondary | ICD-10-CM | POA: Diagnosis not present

## 2021-06-25 DIAGNOSIS — N289 Disorder of kidney and ureter, unspecified: Secondary | ICD-10-CM | POA: Diagnosis not present

## 2021-06-25 DIAGNOSIS — E039 Hypothyroidism, unspecified: Secondary | ICD-10-CM | POA: Diagnosis not present

## 2021-06-25 DIAGNOSIS — E1165 Type 2 diabetes mellitus with hyperglycemia: Secondary | ICD-10-CM | POA: Diagnosis not present

## 2021-06-25 DIAGNOSIS — E559 Vitamin D deficiency, unspecified: Secondary | ICD-10-CM | POA: Diagnosis not present

## 2021-06-29 ENCOUNTER — Ambulatory Visit (INDEPENDENT_AMBULATORY_CARE_PROVIDER_SITE_OTHER): Payer: Medicare Other

## 2021-06-29 DIAGNOSIS — E785 Hyperlipidemia, unspecified: Secondary | ICD-10-CM

## 2021-06-29 DIAGNOSIS — I129 Hypertensive chronic kidney disease with stage 1 through stage 4 chronic kidney disease, or unspecified chronic kidney disease: Secondary | ICD-10-CM

## 2021-06-29 DIAGNOSIS — E1122 Type 2 diabetes mellitus with diabetic chronic kidney disease: Secondary | ICD-10-CM

## 2021-06-29 DIAGNOSIS — E1169 Type 2 diabetes mellitus with other specified complication: Secondary | ICD-10-CM

## 2021-06-29 DIAGNOSIS — E1140 Type 2 diabetes mellitus with diabetic neuropathy, unspecified: Secondary | ICD-10-CM

## 2021-06-29 NOTE — Progress Notes (Signed)
Chronic Care Management Pharmacy Note  07/07/2021 Name:  Mary Parks MRN:  [id number] DOB:  Jun 07, 1950  Summary: 05/2021 a1c 9.5%; receives extra help on copays Will reach out to endo on trulicity dose increase to 21m   Subjective: Mary MARASCOis an 71y.o. year old female who is a primary patient of Cannady, JBarbaraann Faster NP.  The CCM team was consulted for assistance with disease management and care coordination needs.    Engaged with patient by telephone for follow up visit in response to provider referral for pharmacy case management and/or care coordination services.   Consent to Services:  The patient was given information about Chronic Care Management services, agreed to services, and gave verbal consent prior to initiation of services.  Please see initial visit note for detailed documentation.   Patient Care Team: CVenita Lick NP as PCP - General (Nurse Practitioner) [place], AThomasena Edis MD as Referring Physician (Nephrology) [facility])  Hospital visits: None in previous 6 months  Objective:  Lab Results  Component Value Date   CREATININE 1.20 (H) 02/06/2021   CREATININE 1.25 (H) 07/16/2020   CREATININE 1.04 (H) 04/09/2020    Lab Results  Component Value Date   HGBA1C 9.5 (H) 06/05/2021   HGBA1C 8.5 (H) 02/06/2021   HGBA1C 8.1 (H) 07/16/2020   Last diabetic Eye exam:  Lab Results  Component Value Date/Time   HMDIABEYEEXA No Retinopathy 08/20/2020 12:00 AM    Last diabetic Foot exam:  Lab Results  Component Value Date/Time   HMDIABFOOTEX bilateral- normal 06/28/2016 12:00 AM        Component Value Date/Time   CHOL 145 06/05/2021 1114   CHOL 149 02/06/2021 1116   CHOL 163 07/16/2020 1122   CHOL 157 07/07/2015 1039   CHOL 165 03/25/2015 1103   TRIG 104 06/05/2021 1114   TRIG 144 02/06/2021 1116   TRIG 122 07/16/2020 1122   TRIG 135 07/07/2015 1039   TRIG 84 03/25/2015 1103   HDL 42 06/05/2021 1114   HDL 40  02/06/2021 1116   HDL 41 07/16/2020 1122   VLDL 27 07/07/2015 1039   LDLCALC 84 06/05/2021 1114   LDLCALC 84 02/06/2021 1116   LDLCALC 100 (H) 07/16/2020 1122    Hepatic Function Latest Ref Rng & Units 02/06/2021 07/16/2020 04/09/2020  Total Protein 6.0 - 8.5 g/dL 7.0 6.9 6.6  Albumin 3.7 - 4.7 g/dL 4.1 4.1 3.9  AST 0 - 40 IU/L _0 ALT 0 - 32 IU/L _1 Alk Phosphatase 44 - 121 IU/L 105 111 83  Total Bilirubin 0.0 - 1.2 mg/dL 0.4 0.4 0.4    Lab Results  Component Value Date/Time   TSH 0.727 06/05/2021 11:14 AM   TSH 1.840 07/16/2020 11:22 AM   FREET4 1.50 06/05/2021 11:14 AM   FREET4 1.43 07/16/2020 11:22 AM    CBC Latest Ref Rng & Units 06/05/2021 07/16/2020 04/09/2020  WBC 3.4 - 10.8 x10E3/uL 3.6 3.3(L) 3.3(L)  Hemoglobin 11.1 - 15.9 g/dL 12.5 12.4 11.5  Hematocrit 34.0 - 46.6 % 38.7 38.4 35.0  Platelets 150 - 450 x10E3/uL 194 209 211    Lab Results  Component Value Date/Time   VD25OH 33.4 06/05/2021 11:14 AM   VD25OH 53.8 01/02/2020 12:00 AM    Clinical ASCVD:  The 10-year ASCVD risk score (Arnett DK, et al., 2019) is: 13.8%   Values used to calculate the score:     Age: [age]years  Sex: Female     Is Non-Hispanic African American: Yes     Diabetic: Yes     Tobacco smoker: No     Systolic Blood Pressure: 95 mmHg     Is BP treated: Yes     HDL Cholesterol: 42 mg/dL     Total Cholesterol: 145 mg/dL   Social History   Tobacco Use  Smoking Status Never  Smokeless Tobacco Never   BP Readings from Last 3 Encounters:  06/15/21 95/63  06/05/21 130/76  02/06/21 113/74   Pulse Readings from Last 3 Encounters:  06/15/21 70  06/05/21 76  02/06/21 79   Wt Readings from Last 3 Encounters:  06/15/21 178 lb (80.7 kg)  06/05/21 177 lb 6.4 oz (80.5 kg)  02/06/21 176 lb 9.6 oz (80.1 kg)   BMI Readings from Last 3 Encounters:  06/15/21 30.08 kg/m  06/05/21 29.98 kg/m  02/06/21 29.85 kg/m    Assessment: Review of patient past medical history,  allergies, medications, health status, including review of consultants reports, laboratory and other test data, was performed as part of comprehensive evaluation and provision of chronic care management services.   SDOH:  (Social Determinants of Health) assessments and interventions performed: Yes   CCM Care Plan  Allergies  Allergen Reactions   Celebrex [Celecoxib]     hallucinations   Sulfa Antibiotics    Elemental Sulfur Itching   Sulfisoxazole Rash    Medications Reviewed Today     Reviewed by Madelin Rear, [facility] (Pharmacist) on 06/29/21 at 1439  Med List Status: <None>   Medication Order Taking? Sig Documenting Provider Last Dose Status Informant  albuterol (VENTOLIN HFA) 108 (90 Base) MCG/ACT inhaler [id number] No Inhale 2 puffs into the lungs every 6 (six) hours as needed for wheezing or shortness of breath. [name] T, NP Taking Active   amLODipine (NORVASC) 2.5 MG tablet [id number] No Take 2.5 mg by mouth daily. [provider] 06/15/2021 0500 Active   aspirin EC 81 MG tablet [id number] No Take 81 mg by mouth daily. [provider] Past Week Active   atorvastatin (LIPITOR) 80 MG tablet [id number] No Take 1 tablet (80 mg total) by mouth daily. [name] T, NP 06/14/2021 Active   BINAXNOW COVID-19 AG HOME TEST KIT [id number] No Use as Directed on the Package [provider] Taking Active   budesonide-formoterol (SYMBICORT) 160-4.5 MCG/ACT inhaler [id number] No Inhale 2 puffs into the lungs 2 (two) times daily. [name] T, NP Past Week Active   calcium-vitamin D (OSCAL WITH D) 500-200 MG-UNIT tablet [id number] No Take 1 tablet by mouth. [provider] Past Month Active   fluticasone (FLONASE) 50 MCG/ACT nasal spray [id number] No Use 2 spray(s) in each nostril once daily Cannady, Jolene T, NP Past Month Active   insulin glargine (LANTUS SOLOSTAR) 100 UNIT/ML Solostar Pen [id number] No INJECT 45 UNITS SUBCUTANEOUSLY IN MORNING AND 40 UNITS  AT NIGHT. [name] T, NP 06/14/2021 Active            Med Note Theotis Barrio Jun 15, 2021  6:57 AM) 20IU on 06/14/21  insulin lispro (HUMALOG) 100 UNIT/ML KwikPen [id number] No INJECT 20 UNITS SUBCTUANEOUSLY BEFORE BREAKFAST AND LUNCH, AND 30 UNITS BEFORE EVENING MEAL. [name] T, NP 06/14/2021 Active   ketoconazole (NIZORAL) 2 % cream [id number]  Apply 1 application topically daily. [name] T, NP  Active   ketoconazole (NIZORAL) 2 % shampoo [id number]  Apply 1 application topically 2 (two) times a week.  [name] T, NP  Active   levothyroxine (SYNTHROID) 88 MCG tablet [id number] No Take 88 mcg by mouth every morning. [provider] 06/15/2021 0430 Active   lisinopril (ZESTRIL) 30 MG tablet [id number] No Take 30 mg by mouth daily. [provider] Taking Active   meclizine (ANTIVERT) 12.5 MG tablet [id number] No Take 1 tablet (12.5 mg total) by mouth 3 (three) times daily as needed for dizziness. [name] T, NP Taking Active   metoprolol tartrate (LOPRESSOR) 25 MG tablet [id number] No Take 25 mg by mouth 2 (two) times daily. [provider] 06/15/2021 0500 Active   montelukast (SINGULAIR) 10 MG tablet [id number] No Take 1 tablet (10 mg total) by mouth at bedtime. [name] T, NP 06/14/2021 Active   ONE TOUCH ULTRA TEST test strip [id number] No 1 each by Other route 2 (two) times daily.  [provider] Taking Active            Med Note Kelby Aline Oct 24, 2019  3:38 PM)    Jonetta Speak LANCETS 43O MISC [id number] No 2 (two) times daily.  [provider] Taking Active            Med Note Darnelle Maffucci, Arville Lime   Wed Oct 24, 2019  3:38 PM)    pantoprazole (PROTONIX) 40 MG tablet [id number] No Take 1 tablet (40 mg total) by mouth daily. [name] T, NP 06/14/2021 Active   polyethylene glycol-electrolytes (NULYTELY) 420 g solution [id number] No Take by mouth. [provider] Taking Active   SEMGLEE,  YFGN, 100 UNIT/ML Pen [id number] No INJECT 60 UNITS SUBCUTANEOUSLY TWICE DAILY [name] T, NP 7/34/0370 Active   TRULICITY 1.5 DU/4.3CV SOPN [id number] No INJECT 1 SYRINGE SUBCUTANEOUSLY ONCE A WEEK Cannady, Jolene T, NP Past Week Active             Patient Active Problem List   Diagnosis Date Noted   History of colonic polyps    Polyp of descending colon    Elevated erythrocyte sedimentation rate 06/06/2021   Abnormal white blood cell count 06/05/2021   Seborrheic dermatitis 06/05/2021   GERD (gastroesophageal reflux disease) 08/13/2020   Hypothyroidism 04/06/2020   Obesity 10/03/2019   Hyperlipidemia associated with type 2 diabetes mellitus ([place]) 06/29/2017   Benign neoplasm of ascending colon    Benign neoplasm of descending colon    Allergic rhinitis 09/22/2016   OA (osteoarthritis) of knee 06/25/2016   Type 2 diabetes mellitus with diabetic neuropathy, with long-term current use of insulin ([place]) 10/03/2015   Chronic kidney disease, stage 3a ([place]) 12/17/2014   Hypertension associated with chronic kidney disease due to type 2 diabetes mellitus ([place]) 12/17/2014   Diabetes mellitus with chronic kidney disease ([place]) 11/28/2014   Asthma 11/19/2014    Immunization History  Administered Date(s) Administered   Influenza, High Dose Seasonal PF 02/25/2018, 02/15/2019, 02/28/2020, 03/27/2021   Influenza,inj,Quad PF,6+ Mos 03/25/2015   Influenza-Unspecified 02/21/2014, 03/31/2016, 04/01/2017   Moderna SARS-COV2 Booster Vaccination 02/06/2021   PFIZER(Purple Top)SARS-COV-2 Vaccination 07/06/2019, 07/27/2019, 03/13/2020, 10/08/2020   Pneumococcal Conjugate-13 09/26/2013   Pneumococcal Polysaccharide-23 03/13/2008, 03/25/2015   Td 03/13/2008, 01/11/2020    Conditions to be addressed/monitored: HLD HTN DMII  Care Plan : ccm pharmacy care plan  Updates made by Madelin Rear, [place] since 07/07/2021 12:00 AM     Problem: HLD CKD HTN Hypothyroidism GERD Obesity Asthma       Long-Range Goal: disease management   Start Date: 06/29/2021  Expected End Date:  07/07/2022  This Visit's Progress: On track  Priority: High  Note:   Current Barriers:  HLD HTN DM CKD 3a A2   Pharmacist Clinical Goal(s):  Patient will achieve control of DM as evidenced by a1c and BG through collaboration with PharmD and provider.   Interventions: 1:1 collaboration with Venita Lick, NP regarding development and update of comprehensive plan of care as evidenced by provider attestation and co-signature Inter-disciplinary care team collaboration (see longitudinal plan of care) Comprehensive medication review performed; medication list updated in electronic medical record  Hypertension (BP goal <120/80 per neph) -Controlled -At goal, some recent readings <409 systolic. -Current treatment: Lisinopril 30 mg once daily Amlodipine 2.5 mg once daily  -Medications previously tried: n/a  -Current home readings: none provided -Current dietary habits: see DM -Current exercise habits: see DM -Denies hypotensive/hypertensive symptoms -Educated on BP goals and benefits of medications for prevention of heart attack, stroke and kidney damage; Daily salt intake goal < 2300 mg; Exercise goal of 150 minutes per week; Importance of home blood pressure monitoring; -Counseled to monitor BP at home 1-2x/wk, document, and provide log at future appointments -Counseled on diet and exercise extensively Recommended to continue current medication  Diabetes (A1c goal <8%) -Uncontrolled - 06/05/2021 a1c 9.5%, increased from 8.5% 5 months ago. Highest a1c of previous 5 years -Nephrology: [facility] [place] - sees Gwenith Spitz, MD   -Endocrinology: [facility] sees [place]. Phone is [phone] -Current medications: Trulicity 1.5 mg once weekly  Lantus 45 units AM 40 units PM Lispro 20 units before breakfast and lunch, 30 units before evening meal -Current  home glucose readings fasting glucose: 100s -Denies hypoglycemic/hyperglycemic symptoms -Current meal patterns:  breakfast: cereal + water  lunch: snacks - apple juice, bbq sandwich   dinner: not sure -Current exercise: no formal exercise -Educated on A1c and blood sugar goals; Complications of diabetes including kidney damage, retinal damage, and cardiovascular disease; Exercise goal of 150 minutes per week; Prevention and management of hypoglycemic episodes; -Counseled to check feet daily and get yearly eye exams -Counseled on diet and exercise extensively Recommended to continue current medication Recommended for endo to increase trulicity to 78m every week; we will reach out on this request Assessed patient finances. Receives help on copays - no issues here  Patient Goals/Self-Care Activities Patient will:  - take medications as prescribed as evidenced by patient report and record review  Medication Assistance: None required.  Patient affirms current coverage meets needs.  [place] [id number]    Pt endorses 100% compliance  Follow Up:  Patient agrees to Care Plan and Follow-up. Plan: HC contact endo to see if they are ok with increasing trulicity dose. Pharmacist 3 m tel  Future Appointments  Date Time Provider DLluveras 09/03/2021 10:00 AM CMarnee GuarneriT, NP [facility] 09/28/2021  9:00 AM CFP CCM PHARMACY CFP[facility]CFP P[facility]  01/01/2022 11:15 AM CFP NURSE HEALTH ADVISOR CFP-CFP P[facility]   JMadelin Rear PharmD, BSt [facility] LLCClinical Pharmacist  [facility]ractice  [phone]

## 2021-07-07 ENCOUNTER — Telehealth: Payer: Self-pay

## 2021-07-07 ENCOUNTER — Other Ambulatory Visit: Payer: Self-pay | Admitting: Nurse Practitioner

## 2021-07-07 NOTE — Patient Instructions (Addendum)
Mary Parks,  Thank you for talking with me today. I have included our care plan/goals in the following pages.   Please review and call me at [phone] with any questions.  Thanks! Ellin Mayhew, PharmD Clinical Pharmacist  [phone]  Care Plan : [facility] pharmacy care plan  Updates made by Madelin Rear, [facility] since 07/07/2021 12:00 AM     Problem: HLD CKD HTN Hypothyroidism GERD Obesity Asthma      Long-Range Goal: disease management   Start Date: 06/29/2021  Expected End Date: 07/07/2022  This Visit's Progress: On track  Priority: High  Note:   Current Barriers:  HLD HTN DM CKD 3a A2   Pharmacist Clinical Goal(s):  Patient will achieve control of DM as evidenced by a1c and BG through collaboration with PharmD and provider.   Interventions: 1:1 collaboration with Venita Lick, NP regarding development and update of comprehensive plan of care as evidenced by provider attestation and co-signature Inter-disciplinary care team collaboration (see longitudinal plan of care) Comprehensive medication review performed; medication list updated in electronic medical record  Hypertension (BP goal <120/80 per neph) -Controlled -At goal, some recent readings <751 systolic. -Current treatment: Lisinopril 30 mg once daily Amlodipine 2.5 mg once daily  -Medications previously tried: n/a  -Current home readings: none provided -Current dietary habits: see DM -Current exercise habits: see DM -Denies hypotensive/hypertensive symptoms -Educated on BP goals and benefits of medications for prevention of heart attack, stroke and kidney damage; Daily salt intake goal < 2300 mg; Exercise goal of 150 minutes per week; Importance of home blood pressure monitoring; -Counseled to monitor BP at home 1-2x/wk, document, and provide log at future appointments -Counseled on diet and exercise extensively Recommended to continue current medication  Diabetes (A1c goal <8%) -Uncontrolled -  06/05/2021 a1c 9.5%, increased from 8.5% 5 months ago. Highest a1c of previous 5 years -Nephrology: [facility] [place] - sees Gwenith Spitz, MD   -Endocrinology: [facility] sees [place]. Phone is [phone] -Current medications: Trulicity 1.5 mg once weekly  Lantus 45 units AM 40 units PM Lispro 20 units before breakfast and lunch, 30 units before evening meal -Current home glucose readings fasting glucose: 100s -Denies hypoglycemic/hyperglycemic symptoms -Current meal patterns:  breakfast: cereal + water  lunch: snacks - apple juice, bbq sandwich   dinner: not sure -Current exercise: no formal exercise -Educated on A1c and blood sugar goals; Complications of diabetes including kidney damage, retinal damage, and cardiovascular disease; Exercise goal of 150 minutes per week; Prevention and management of hypoglycemic episodes; -Counseled to check feet daily and get yearly eye exams -Counseled on diet and exercise extensively Recommended to continue current medication Recommended for endo to increase trulicity to 3mg  every week; we will reach out on this request Assessed patient finances. Receives help on copays - no issues here  Patient Goals/Self-Care Activities Patient will:  - take medications as prescribed as evidenced by patient report and record review  Medication Assistance: None required.  Patient affirms current coverage meets needs.  Patient's preferred pharmacy is:  [facility] [place], [place] [address] [place] [id number]    The patient verbalized understanding of instructions provided today and agreed to receive a MyChart copy of patient instruction and/or educational materials. Telephone follow up appointment with pharmacy team member scheduled for: See next appointment with "Care Management Staff" under "What's Next" below.     Diabetes Mellitus and  Nutrition, Adult When you  have diabetes, or diabetes mellitus, it is very important to have healthy eating habits because your blood sugar (glucose) levels are greatly affected by what you eat and drink. Eating healthy foods in the right amounts, at about the same times every day, can help you: Manage your blood glucose. Lower your risk of heart disease. Improve your blood pressure. Reach or maintain a healthy weight. What can affect my meal plan? Every person with diabetes is different, and each person has different needs for a meal plan. Your health care provider may recommend that you work with a dietitian to make a meal plan that is best for you. Your meal plan may vary depending on factors such as: The calories you need. The medicines you take. Your weight. Your blood glucose, blood pressure, and cholesterol levels. Your activity level. Other health conditions you have, such as heart or kidney disease. How do carbohydrates affect me? Carbohydrates, also called carbs, affect your blood glucose level more than any other type of food. Eating carbs raises the amount of glucose in your blood. It is important to know how many carbs you can safely have in each meal. This is different for every person. Your dietitian can help you calculate how many carbs you should have at each meal and for each snack. How does alcohol affect me? Alcohol can cause a decrease in blood glucose (hypoglycemia), especially if you use insulin or take certain diabetes medicines by mouth. Hypoglycemia can be a life-threatening condition. Symptoms of hypoglycemia, such as sleepiness, dizziness, and confusion, are similar to symptoms of having too much alcohol. Do not drink alcohol if: Your health care provider tells you not to drink. You are pregnant, may be pregnant, or are planning to become pregnant. If you drink alcohol: Limit how much you have to: 0-1 drink a day for women. 0-2 drinks a day for men. Know how much alcohol is in your drink.  In the [place] one drink equals one 12 oz bottle of beer (355 mL), one 5 oz glass of wine (148 mL), or one 1 oz glass of hard liquor (44 mL). Keep yourself hydrated with water, diet soda, or unsweetened iced tea. Keep in mind that regular soda, juice, and other mixers may contain a lot of sugar and must be counted as carbs. What are tips for following this plan? Reading food labels Start by checking the serving size on the Nutrition Facts label of packaged foods and drinks. The number of calories and the amount of carbs, fats, and other nutrients listed on the label are based on one serving of the item. Many items contain more than one serving per package. Check the total grams (g) of carbs in one serving. Check the number of grams of saturated fats and trans fats in one serving. Choose foods that have a low amount or none of these fats. Check the number of milligrams (mg) of salt (sodium) in one serving. Most people should limit total sodium intake to less than 2,300 mg per day. Always check the nutrition information of foods labeled as "low-fat" or "nonfat." These foods may be higher in added sugar or refined carbs and should be avoided. Talk to your dietitian to identify your daily goals for nutrients listed on the label. Shopping Avoid buying canned, pre-made, or processed foods. These foods tend to be high in fat, sodium, and added sugar. Shop around the outside edge of the grocery store. This is where you will most often  find fresh fruits and vegetables, bulk grains, fresh meats, and fresh dairy products. Cooking Use low-heat cooking methods, such as baking, instead of high-heat cooking methods, such as deep frying. Cook using healthy oils, such as olive, canola, or sunflower oil. Avoid cooking with butter, cream, or high-fat meats. Meal planning Eat meals and snacks regularly, preferably at the same times every day. Avoid going long periods of time without eating. Eat foods that are high in  fiber, such as fresh fruits, vegetables, beans, and whole grains. Eat 4-6 oz (112-168 g) of lean protein each day, such as lean meat, chicken, fish, eggs, or tofu. One ounce (oz) (28 g) of lean protein is equal to: 1 oz (28 g) of meat, chicken, or fish. 1 egg.  cup (62 g) of tofu. Eat some foods each day that contain healthy fats, such as avocado, nuts, seeds, and fish. What foods should I eat? Fruits Berries. Apples. Oranges. Peaches. Apricots. Plums. Grapes. Mangoes. Papayas. Pomegranates. Kiwi. Cherries. Vegetables Leafy greens, including lettuce, spinach, kale, chard, collard greens, mustard greens, and cabbage. Beets. Cauliflower. Broccoli. Carrots. Green beans. Tomatoes. Peppers. Onions. Cucumbers. Brussels sprouts. Grains Whole grains, such as whole-wheat or whole-grain bread, crackers, tortillas, cereal, and pasta. Unsweetened oatmeal. Quinoa. Brown or wild rice. Meats and other proteins Seafood. Poultry without skin. Lean cuts of poultry and beef. Tofu. Nuts. Seeds. Dairy Low-fat or fat-free dairy products such as milk, yogurt, and cheese. The items listed above may not be a complete list of foods and beverages you can eat and drink. Contact a dietitian for more information. What foods should I avoid? Fruits Fruits canned with syrup. Vegetables Canned vegetables. Frozen vegetables with butter or cream sauce. Grains Refined white flour and flour products such as bread, pasta, snack foods, and cereals. Avoid all processed foods. Meats and other proteins Fatty cuts of meat. Poultry with skin. Breaded or fried meats. Processed meat. Avoid saturated fats. Dairy Full-fat yogurt, cheese, or milk. Beverages Sweetened drinks, such as soda or iced tea. The items listed above may not be a complete list of foods and beverages you should avoid. Contact a dietitian for more information. Questions to ask a health care provider Do I need to meet with a certified diabetes care and education  specialist? Do I need to meet with a dietitian? What number can I call if I have questions? When are the best times to check my blood glucose? Where to find more information: American Diabetes Association: diabetes.org Academy of Nutrition and Dietetics: eatright.[employer] of Diabetes and Digestive and Kidney Diseases: [employer]is Association of Diabetes Care & Education Specialists: diabeteseducator.org Summary It is important to have healthy eating habits because your blood sugar (glucose) levels are greatly affected by what you eat and drink. It is important to use alcohol carefully. A healthy meal plan will help you manage your blood glucose and lower your risk of heart disease. Your health care provider may recommend that you work with a dietitian to make a meal plan that is best for you. This information is not intended to replace advice given to you by your health care provider. Make sure you discuss any questions you have with your health care provider. Document Revised: 12/12/2019 Document Reviewed: 12/12/2019 Elsevier Patient Education  [place].

## 2021-07-07 NOTE — Progress Notes (Signed)
I have attempt to reach out to patients Endocrinology Dr. Rosario Jacks about increasing Trulicity. The office is closed for lunch. I will reach out back at 1:30pm.   I contacted Endocrinologist back again they stated the had just done Labs on Mary Parks on 06/26/21 and the endocrinologist wanted the patient to make a appointment and to document blood sugar numbers and bring to the there office. Patient declined to make appointment due to her going out of town for a few weeks.However, Patient did call just to give them blood sugar readings only but did not schedule any appointment with Endocrinologist. The staff at Dr. Daron Offer office stated that the provider will not change anything on her medications unless the patient goes and be seen by the endocrinologist.   Corrie Mckusick, [place]

## 2021-07-08 NOTE — Telephone Encounter (Signed)
Refilled 02/06/2021 #90 with 4 refills - 1 year supply. Requested Prescriptions  Pending Prescriptions Disp Refills   atorvastatin (LIPITOR) 80 MG tablet [Pharmacy Med Name: Atorvastatin Calcium 80 MG Oral Tablet] 90 tablet 0    Sig: Take 1 tablet by mouth once daily     Cardiovascular:  Antilipid - Statins Failed - 07/07/2021  3:01 PM      Failed - Lipid Panel in normal range within the last 12 months    Cholesterol, Total  Date Value Ref Range Status  06/05/2021 145 100 - 199 mg/dL Final   Cholesterol Piccolo, Waived  Date Value Ref Range Status  07/07/2015 157 <200 mg/dL Final    Comment:                            Desirable                <200                         Borderline High      200- 239                         High                     >239    LDL Chol Calc (NIH)  Date Value Ref Range Status  06/05/2021 84 0 - 99 mg/dL Final   HDL  Date Value Ref Range Status  06/05/2021 42 >39 mg/dL Final   Triglycerides  Date Value Ref Range Status  06/05/2021 104 0 - 149 mg/dL Final   Triglycerides Piccolo,Waived  Date Value Ref Range Status  07/07/2015 135 <150 mg/dL Final    Comment:                            Normal                   <150                         Borderline High     150 - 199                         High                200 - 499                         Very High                >499          Passed - Patient is not pregnant      Passed - Valid encounter within last 12 months    Recent Outpatient Visits          1 month ago 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, with long-term current use of insulin ([place])   [place], Jolene T, NP   5 months ago Type 2 diabetes mellitus with diabetic neuropathy, with long-term current use of insulin ([place])   [place], Warren T, NP   10 months ago Slow transit constipation   [facility] [place], Barbaraann Faster, NP  11 months ago Type 2 diabetes mellitus with diabetic neuropathy, with long-term current use of insulin ([place])   [place], [place] T, NP   1 year ago Acute gastroenteritis   [place], Barbaraann Faster, NP      Future Appointments            In 1 month [place], Barbaraann Faster, NP [facility]   In 5 months  [facility]

## 2021-07-20 ENCOUNTER — Ambulatory Visit (INDEPENDENT_AMBULATORY_CARE_PROVIDER_SITE_OTHER): Payer: Medicare Other

## 2021-07-20 ENCOUNTER — Ambulatory Visit (INDEPENDENT_AMBULATORY_CARE_PROVIDER_SITE_OTHER)
Admit: 2021-07-20 | Discharge: 2021-07-20 | Disposition: A | Discharge status: Home / Self Care | Payer: Medicare Other | Attending: Nurse Practitioner | Admitting: Nurse Practitioner

## 2021-07-20 ENCOUNTER — Ambulatory Visit
Admission: EM | Admit: 2021-07-20 | Discharge: 2021-07-20 | Disposition: A | Discharge status: Home / Self Care | Payer: Medicare Other | Attending: Physician Assistant | Admitting: Physician Assistant

## 2021-07-20 ENCOUNTER — Other Ambulatory Visit: Payer: Self-pay

## 2021-07-20 DIAGNOSIS — R1011 Right upper quadrant pain: Secondary | ICD-10-CM

## 2021-07-20 DIAGNOSIS — M546 Pain in thoracic spine: Secondary | ICD-10-CM | POA: Diagnosis not present

## 2021-07-20 DIAGNOSIS — R109 Unspecified abdominal pain: Secondary | ICD-10-CM

## 2021-07-20 DIAGNOSIS — K219 Gastro-esophageal reflux disease without esophagitis: Secondary | ICD-10-CM | POA: Diagnosis not present

## 2021-07-20 LAB — URINALYSIS, ROUTINE W REFLEX MICROSCOPIC
Bilirubin Urine: NEGATIVE
Glucose, UA: NEGATIVE mg/dL
Ketones, ur: NEGATIVE mg/dL
Leukocytes,Ua: NEGATIVE
Nitrite: NEGATIVE
Protein, ur: >300 mg/dL — AB
Specific Gravity, Urine: >1.03 — ABNORMAL HIGH (ref 1.005–1.030)
pH: 5.5 (ref 5.0–8.0)

## 2021-07-20 LAB — CBC WITH DIFFERENTIAL/PLATELET
Abs Immature Granulocytes: 0.01 10*3/uL (ref 0.00–0.07)
Basophils Absolute: 0 10*3/uL (ref 0.0–0.1)
Basophils Relative: 1 %
Eosinophils Absolute: 0.1 10*3/uL (ref 0.0–0.5)
Eosinophils Relative: 3 %
HCT: 38.8 % (ref 36.0–46.0)
Hemoglobin: 12.3 g/dL (ref 12.0–15.0)
Immature Granulocytes: 0 %
Lymphocytes Relative: 31 %
Lymphs Abs: 1.2 10*3/uL (ref 0.7–4.0)
MCH: 27.1 pg (ref 26.0–34.0)
MCHC: 31.7 g/dL (ref 30.0–36.0)
MCV: 85.5 fL (ref 80.0–100.0)
Monocytes Absolute: 0.3 10*3/uL (ref 0.1–1.0)
Monocytes Relative: 7 %
Neutro Abs: 2.2 10*3/uL (ref 1.7–7.7)
Neutrophils Relative %: 58 %
Platelets: 194 10*3/uL (ref 150–400)
RBC: 4.54 MIL/uL (ref 3.87–5.11)
RDW: 14.1 % (ref 11.5–15.5)
WBC: 3.8 10*3/uL — ABNORMAL LOW (ref 4.0–10.5)
nRBC: 0 % (ref 0.0–0.2)

## 2021-07-20 LAB — COMPREHENSIVE METABOLIC PANEL
ALT: 16 U/L (ref 0–44)
AST: 18 U/L (ref 15–41)
Albumin: 3.7 g/dL (ref 3.5–5.0)
Alkaline Phosphatase: 76 U/L (ref 38–126)
Anion gap: 7 (ref 5–15)
BUN: 14 mg/dL (ref 8–23)
CO2: 28 mmol/L (ref 22–32)
Calcium: 9.1 mg/dL (ref 8.9–10.3)
Chloride: 107 mmol/L (ref 98–111)
Creatinine, Ser: 1.04 mg/dL — ABNORMAL HIGH (ref 0.44–1.00)
GFR, Estimated: 57 mL/min — ABNORMAL LOW (ref >60)
Glucose, Bld: 76 mg/dL (ref 70–99)
Potassium: 3.8 mmol/L (ref 3.5–5.1)
Sodium: 142 mmol/L (ref 135–145)
Total Bilirubin: 0.7 mg/dL (ref 0.3–1.2)
Total Protein: 7.5 g/dL (ref 6.5–8.1)

## 2021-07-20 LAB — URINALYSIS, MICROSCOPIC (REFLEX): Squamous Epithelial / HPF: >50 (ref 0–5)

## 2021-07-20 LAB — LIPASE, BLOOD: Lipase: 27 U/L (ref 11–51)

## 2021-07-20 MED ORDER — TRAMADOL HCL 50 MG PO TABS: 50.0000 mg | ORAL_TABLET | Freq: Four times a day (QID) | ORAL | 0 refills | Status: AC | PRN

## 2021-07-20 NOTE — ED Triage Notes (Signed)
Pt c/o right flank pain and stomach pain x1week.   Pt states that she was seen for the abdominal pain and was diagnosed with GERD.   Pt states that she is having urinary urgency and odor.

## 2021-07-20 NOTE — ED Provider Notes (Signed)
[facility]    CSN: [id number] Arrival date & time: 07/20/21  1156      History   Chief Complaint Chief Complaint  Patient presents with   Flank Pain   Abdominal Pain    HPI Mary Parks is a 71 y.o. female in for approximately 1 week history of right flank/back pain, right upper quadrant pain and epigastric pain.  Patient reports she has a history of GERD and the epigastric pain is burning in nature and worse after she eats.  Reports GERD pain had gotten better until the past couple days.  Reports eating fried sausage for breakfast today.  She does report urinary urgency with some odor.  Concerned about possible UTI or kidney stone.  The flank pain seems to be worse when she is laying on the affected side.  She states she does not have any pain when she lays on her left side.  Has not taken any OTC meds to help with symptoms.  Patient reports history of type 2 diabetes, asthma, hypertension, hyperlipidemia, hypothyroidism, and chronic kidney disease.  No other complaints.  HPI  Past Medical History:  Diagnosis Date   Asthma    Chest pain    DM2 (diabetes mellitus, type 2) ([place]) 1987   Dyspnea    Cath normal coronaries. normal EF. RA 11. 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/14/15   Vertigo    none recently    Patient Active Problem List   Diagnosis Date Noted   History of colonic polyps    Polyp of descending colon    Elevated erythrocyte sedimentation rate 06/06/2021   Abnormal white blood cell count 06/05/2021   Seborrheic dermatitis 06/05/2021   GERD (gastroesophageal reflux disease) 08/13/2020   Hypothyroidism 04/06/2020   Obesity 10/03/2019   Hyperlipidemia associated with type 2 diabetes mellitus ([place]) 06/29/2017   Benign neoplasm of ascending colon     Benign neoplasm of descending colon    Allergic rhinitis 09/22/2016   OA (osteoarthritis) of knee 06/25/2016   Type 2 diabetes mellitus with diabetic neuropathy, with long-term current use of insulin ([place]) 10/03/2015   Chronic kidney disease, stage 3a ([place]) 12/17/2014   Hypertension associated with chronic kidney disease due to type 2 diabetes mellitus ([place]) 12/17/2014   Diabetes mellitus with chronic kidney disease ([place]) 11/28/2014   Asthma 11/19/2014    Past Surgical History:  Procedure Laterality Date   CARDIAC CATHETERIZATION     CATARACT EXTRACTION W/PHACO Right 03/31/2015   Procedure: CATARACT EXTRACTION PHACO AND INTRAOCULAR LENS PLACEMENT ([place]);  Surgeon: Ronnell Freshwater, MD;  Location: [place];  Service: Ophthalmology;  Laterality: Right;  DIABETIC - insulin   COLONOSCOPY WITH PROPOFOL N/A 11/12/2016   Procedure: COLONOSCOPY WITH PROPOFOL;  Surgeon: [name], MD;  Location: [place];  Service: Endoscopy;  Laterality: N/A;  Diabetic - insulin   COLONOSCOPY WITH PROPOFOL N/A 06/15/2021   Procedure: COLONOSCOPY WITH BIOPSY;  Surgeon: [name], MD;  Location: [place];  Service: Endoscopy;  Laterality: N/A;  Diabetic   EYE SURGERY     HERNIA REPAIR     umbilical   POLYPECTOMY  11/12/2016   Procedure: POLYPECTOMY INTESTINAL;  Surgeon: [name], MD;  Location: [place]  [facility];  Service: Endoscopy;;   POLYPECTOMY N/A 06/15/2021   Procedure: POLYPECTOMY;  Surgeon: [name], MD;  Location: [place];  Service: Endoscopy;  Laterality: N/A;   THYROIDECTOMY N/A 04/14/2015   Procedure: THYROIDECTOMY;  Surgeon: Margaretha Sheffield, MD;  Location: [facility];  Service: ENT;  Laterality: N/A;    OB History   No obstetric history on file.      Home Medications    Prior to Admission medications   Medication Sig Start Date End Date Taking? Authorizing Provider  albuterol (VENTOLIN HFA) 108 (90 Base) MCG/ACT inhaler Inhale 2  puffs into the lungs every 6 (six) hours as needed for wheezing or shortness of breath. 01/02/20  Yes Cannady, Jolene T, NP  amLODipine (NORVASC) 2.5 MG tablet Take 2.5 mg by mouth daily. 12/06/18  Yes [provider]  aspirin EC 81 MG tablet Take 81 mg by mouth daily.   Yes [provider]  atorvastatin (LIPITOR) 80 MG tablet Take 1 tablet (80 mg total) by mouth daily. 02/06/21  Yes Cannady, Barbaraann Faster, NP  BINAXNOW COVID-19 AG HOME TEST KIT Use as Directed on the Package 01/30/21  Yes [provider]  budesonide-formoterol (SYMBICORT) 160-4.5 MCG/ACT inhaler Inhale 2 puffs into the lungs 2 (two) times daily. 02/06/21  Yes Cannady, Jolene T, NP  calcium-vitamin D (OSCAL WITH D) 500-200 MG-UNIT tablet Take 1 tablet by mouth.   Yes [provider]  fluticasone (FLONASE) 50 MCG/ACT nasal spray Use 2 spray(s) in each nostril once daily 05/04/21  Yes Cannady, Jolene T, NP  insulin glargine (LANTUS SOLOSTAR) 100 UNIT/ML Solostar Pen INJECT 45 UNITS SUBCUTANEOUSLY IN MORNING AND 40 UNITS AT NIGHT. 02/06/21  Yes Cannady, Jolene T, NP  insulin lispro (HUMALOG) 100 UNIT/ML KwikPen INJECT 20 UNITS SUBCTUANEOUSLY BEFORE BREAKFAST AND LUNCH, AND 30 UNITS BEFORE EVENING MEAL. 03/30/21  Yes Cannady, Jolene T, NP  ketoconazole (NIZORAL) 2 % cream Apply 1 application topically daily. 06/05/21  Yes Cannady, Jolene T, NP  ketoconazole (NIZORAL) 2 % shampoo Apply 1 application topically 2 (two) times a week. 06/08/21  Yes Cannady, Henrine Screws T, NP  levothyroxine (SYNTHROID) 88 MCG tablet Take 88 mcg by mouth every morning. 08/13/19  Yes [provider]  lisinopril (ZESTRIL) 30 MG tablet Take 30 mg by mouth daily. 03/30/21  Yes [provider]  meclizine (ANTIVERT) 12.5 MG tablet Take 1 tablet (12.5 mg total) by mouth 3 (three) times daily as needed for dizziness. 02/06/21  Yes Cannady, Jolene T, NP  metoprolol tartrate (LOPRESSOR) 25 MG tablet Take 25 mg by mouth 2 (two) times daily.  07/09/16  Yes [provider]  montelukast (SINGULAIR) 10 MG tablet Take 1 tablet (10 mg total) by mouth at bedtime. 02/06/21  Yes Cannady, Jolene T, NP  ONE TOUCH ULTRA TEST test strip 1 each by Other route 2 (two) times daily.  03/15/15  Yes [provider]  Jonetta Speak LANCETS 79K MISC 2 (two) times daily.  03/15/15  Yes [provider]  pantoprazole (PROTONIX) 40 MG tablet Take 1 tablet (40 mg total) by mouth daily. 06/05/21  Yes Cannady, Jolene T, NP  polyethylene glycol-electrolytes (NULYTELY) 420 g solution Take by mouth. 06/02/21  Yes [provider]  SEMGLEE, YFGN, 100 UNIT/ML Pen INJECT 40 UNITS SUBCUTANEOUSLY TWICE DAILY 01/09/21  Yes Cannady, Jolene T, NP  traMADol (ULTRAM) 50 MG tablet Take 1 tablet (50 mg total) by mouth every 6 (six) hours as needed for up to 3 days. 07/20/21 07/23/21 Yes Carlyon Prows,  Basir Niven B, PA-C  TRULICITY 1.5 RW/4.3XV SOPN INJECT 1 SYRINGE SUBCUTANEOUSLY ONCE A WEEK 01/05/21  Yes Cannady, Henrine Screws T, NP    Family History Family History  Problem Relation Age of Onset   Diabetes Mother    Diabetes Father    Diabetes Sister    Diabetes Brother    Diabetes Sister    Diabetes Sister    Diabetes Maternal Grandmother    Diabetes Maternal Grandfather    Diabetes Paternal Grandmother    Diabetes Paternal Grandfather    Heart disease Neg Hx    Coronary artery disease Neg Hx    Breast cancer Neg Hx     Social History Social History   Tobacco Use   Smoking status: Never   Smokeless tobacco: Never  Vaping Use   Vaping Use: Never used  Substance Use Topics   Alcohol use: No   Drug use: No     Allergies   Celebrex [celecoxib], Sulfa antibiotics, Elemental sulfur, and Sulfisoxazole   Review of Systems Review of Systems  Constitutional:  Negative for appetite change, fatigue and fever.  Respiratory:  Negative for shortness of breath.   Cardiovascular:  Negative for chest pain.  Gastrointestinal:  Positive for abdominal  pain. Negative for constipation, diarrhea, nausea and vomiting.  Genitourinary:  Positive for flank pain and frequency. Negative for difficulty urinating, dysuria, hematuria and urgency.  Musculoskeletal:  Positive for back pain.  Neurological:  Negative for weakness and numbness.    Physical Exam Triage Vital Signs ED Triage Vitals  Enc Vitals Group     BP 07/20/21 1243 139/80     Pulse Rate 07/20/21 1243 77     Resp 07/20/21 1243 18     Temp 07/20/21 1243 98.6 F (37 C)     Temp Source 07/20/21 1243 Oral     SpO2 07/20/21 1243 100 %     Weight 07/20/21 1241 170 lb (77.1 kg)     Height 07/20/21 1241 _0  (1.626 m)     Head Circumference --      Peak Flow --      Pain Score 07/20/21 1241 8     Pain Loc --      Pain Edu? --      Excl. in [place]? --    No data found.  Updated Vital Signs BP 139/80 (BP Location: Left Arm)    Pulse 77    Temp 98.6 F (37 C) (Oral)    Resp 18    Ht _1  (1.626 m)    Wt 170 lb (77.1 kg)    LMP  (LMP Unknown)    SpO2 100%    BMI 29.18 kg/m     Physical Exam Vitals and nursing note reviewed.  Constitutional:      General: She is not in acute distress.    Appearance: Normal appearance. She is well-developed. She is not ill-appearing or toxic-appearing.  HENT:     Head: Normocephalic and atraumatic.  Eyes:     General: No scleral icterus.       Right eye: No discharge.        Left eye: No discharge.     Conjunctiva/sclera: Conjunctivae normal.  Cardiovascular:     Rate and Rhythm: Normal rate and regular rhythm.     Heart sounds: Normal heart sounds.  Pulmonary:     Effort: Pulmonary effort is normal. No respiratory distress.     Breath sounds: Normal breath sounds.  Abdominal:  General: Abdomen is protuberant.     Tenderness: There is abdominal tenderness in the right upper quadrant, epigastric area and periumbilical area. There is no right CVA tenderness, left CVA tenderness or guarding.  Musculoskeletal:        General: Tenderness  (TTP right thoracic muscles) present.     Cervical back: Neck supple.  Skin:    General: Skin is dry.  Neurological:     General: No focal deficit present.     Mental Status: She is alert. Mental status is at baseline.     Motor: No weakness.     Coordination: Coordination normal.     Gait: Gait normal.  Psychiatric:        Mood and Affect: Mood normal.        Behavior: Behavior normal.        Thought Content: Thought content normal.     UC Treatments / Results  Labs (all labs ordered are listed, but only abnormal results are displayed) Labs Reviewed  URINALYSIS, ROUTINE W REFLEX MICROSCOPIC - Abnormal; Notable for the following components:      Result Value   Specific Gravity, Urine >1.030 (*)    Hgb urine dipstick TRACE (*)    Protein, ur >300 (*)    All other components within normal limits  URINALYSIS, MICROSCOPIC (REFLEX) - Abnormal; Notable for the following components:   Bacteria, UA FEW (*)    All other components within normal limits  CBC WITH DIFFERENTIAL/PLATELET - Abnormal; Notable for the following components:   WBC 3.8 (*)    All other components within normal limits  COMPREHENSIVE METABOLIC PANEL - Abnormal; Notable for the following components:   Creatinine, Ser 1.04 (*)    GFR, Estimated 57 (*)    All other components within normal limits  LIPASE, BLOOD    EKG   Radiology DG Abdomen 1 View  Result Date: 07/20/2021 CLINICAL DATA:  RIGHT flank pain and urinary odor for 1 week EXAM: ABDOMEN - 1 VIEW COMPARISON:  04/05/2020 FINDINGS: Calcified uterine leiomyomata and pelvis. No urinary tract calcification. Bowel gas pattern normal. Bones demineralized with degenerative changes of thoracolumbar spine. IMPRESSION: No acute abnormalities. Electronically Signed   By: Lavonia Dana M.D.   On: 07/20/2021 13:56   US Abdomen Limited RUQ (LIVER/GB)  Result Date: 07/20/2021 CLINICAL DATA:  RIGHT upper quadrant abdominal pain for 1 month EXAM: ULTRASOUND ABDOMEN  LIMITED RIGHT UPPER QUADRANT COMPARISON:  09/20/2006 FINDINGS: Gallbladder: Normally distended without stones or wall thickening. No pericholecystic fluid or sonographic Murphy sign. Common bile duct: Diameter: 5 mm, normal Liver: Echogenic parenchyma, likely fatty infiltration though this can be seen with cirrhosis and certain infiltrative disorders. No focal hepatic mass or nodularity. No intrahepatic biliary dilatation. Portal vein is patent on color Doppler imaging with normal direction of blood flow towards the liver. Other: No RIGHT upper quadrant free fluid. IMPRESSION: Probable fatty infiltration of liver as above. Otherwise negative exam. Electronically Signed   By: Lavonia Dana M.D.   On: 07/20/2021 15:28    Procedures Procedures (including critical care time)  Medications Ordered in UC Medications - No data to display  Initial Impression / Assessment and Plan / UC Course  I have reviewed the triage vital signs and the nursing notes.  Pertinent labs & imaging results that were available during my care of the patient were reviewed by me and considered in my medical decision making (see chart for details).  71 year old female presenting for approximately 1 week history of  right flank/back pain, right upper quadrant pain and epigastric pain.  Denies injury.  Does report lifting a lot of gallons of water at her job.  Vitals normal and stable.  Patient is overall well-appearing.  On exam she has mild to moderate tenderness of the epigastric region, right upper quadrant (no Murphy's sign), right thoracic muscles.  No CVA tenderness.  Chest clear to auscultation heart regular rate and rhythm.  Urinalysis shows greater than 1.030 specific gravity, trace hemoglobin, protein.  KUB performed to assess for possible underlying renal stone.  Negative for any suspicious stones.  Next concern would be cholelithiasis.  Right upper quadrant ultrasound obtained, CBC, CMP and lipase.  Labs all reassuring.   GFR slightly decreased at 57 patient has known CKD stage III.  KUB shows possible fatty liver, otherwise normal.  No evidence of cholelithiasis.  Discussed this result with patient.  Advised patient is still possible she could have a kidney stone but I believe her symptoms are more likely related to musculoskeletal type pain.  She reports increased pain when she sits back on the area, turns or twists.  We will treat at this time with following RICE guidelines, heat application, lidocaine patches.  Sent short supply of Ultram if absolutely needed.  Thoroughly reviewed ED precautions with patient regarding flank pain/abdominal pain and back pain.  Final Clinical Impressions(s) / UC Diagnoses   Final diagnoses:  Abdominal pain, RUQ (right upper quadrant)  Acute right-sided thoracic back pain  Gastroesophageal reflux disease, unspecified whether esophagitis present     Discharge Instructions      -Labs are all reassuring.  No sign of UTI - X-ray of your abdomen does not reveal any signs of a kidney stone but as we discussed it does not 100% rule it out so if you continue to have the back pain over the next week or it is associated with fever, feeling fatigued, sweaty, nausea/vomiting, urinary symptoms you should be seen again to have CT scan performed.  We do not have CT today.  You need to go to the ER. -Symptoms are most consistent with musculoskeletal type back pain.  I have sent something for pain relief.  You can still take Tylenol if needed, use IcyHot or lidocaine patches and consider heating pad.     ED Prescriptions     Medication Sig Dispense Auth. Provider   traMADol (ULTRAM) 50 MG tablet Take 1 tablet (50 mg total) by mouth every 6 (six) hours as needed for up to 3 days. 12 tablet Danton Clap, PA-C      I have reviewed the PDMP during this encounter.   Danton Clap, PA-C 07/20/21 1611

## 2021-07-20 NOTE — Discharge Instructions (Addendum)
-  Labs are all reassuring.  No sign of UTI - X-ray of your abdomen does not reveal any signs of a kidney stone but as we discussed it does not 100% rule it out so if you continue to have the back pain over the next week or it is associated with fever, feeling fatigued, sweaty, nausea/vomiting, urinary symptoms you should be seen again to have CT scan performed.  We do not have CT today.  You need to go to the ER. -Symptoms are most consistent with musculoskeletal type back pain.  I have sent something for pain relief.  You can still take Tylenol if needed, use IcyHot or lidocaine patches and consider heating pad.

## 2021-07-21 DIAGNOSIS — E785 Hyperlipidemia, unspecified: Secondary | ICD-10-CM

## 2021-07-21 DIAGNOSIS — E1122 Type 2 diabetes mellitus with diabetic chronic kidney disease: Secondary | ICD-10-CM

## 2021-07-21 DIAGNOSIS — Z794 Long term (current) use of insulin: Secondary | ICD-10-CM | POA: Diagnosis not present

## 2021-07-21 DIAGNOSIS — E1169 Type 2 diabetes mellitus with other specified complication: Secondary | ICD-10-CM

## 2021-07-21 DIAGNOSIS — I129 Hypertensive chronic kidney disease with stage 1 through stage 4 chronic kidney disease, or unspecified chronic kidney disease: Secondary | ICD-10-CM

## 2021-07-21 DIAGNOSIS — E1140 Type 2 diabetes mellitus with diabetic neuropathy, unspecified: Secondary | ICD-10-CM | POA: Diagnosis not present

## 2021-08-07 DIAGNOSIS — H2512 Age-related nuclear cataract, left eye: Secondary | ICD-10-CM | POA: Diagnosis not present

## 2021-08-12 DIAGNOSIS — H2512 Age-related nuclear cataract, left eye: Secondary | ICD-10-CM | POA: Diagnosis not present

## 2021-08-17 ENCOUNTER — Other Ambulatory Visit: Payer: Self-pay | Admitting: Nurse Practitioner

## 2021-08-18 NOTE — Telephone Encounter (Signed)
Requested Prescriptions  ?Pending Prescriptions Disp Refills  ?? TRULICITY 1.5 HC/6.2BJ SOPN [Pharmacy Med Name: Trulicity 1.5 SE/8.3TD Subcutaneous Solution Pen-injector] 6 mL 0  ?  Sig: INJECT 1 SYRINGE SUBCUTANEOUSLY ONCE A WEEK  ?  ? Endocrinology:  Diabetes - GLP-1 Receptor Agonists Failed - 08/17/2021 10:17 AM  ?  ?  Failed - HBA1C is between 0 and 7.9 and within 180 days  ?  Hemoglobin A1C  ?Date Value Ref Range Status  ?01/02/2020 9.3  Final  ? ?HB A1C (BAYER DCA - WAIVED)  ?Date Value Ref Range Status  ?06/05/2021 9.5 (H) 4.8 - 5.6 % Final  ?  Comment:  ?           Prediabetes: 5.7 - 6.4 ?         Diabetes: >6.4 ?         Glycemic control for adults with diabetes: <7.0 ?  ?   ?  ?  Passed - Valid encounter within last 6 months  ?  Recent Outpatient Visits   ?      ? 2 months ago 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, with long-term current use of insulin ([place])  ? [place], [place] T, NP  ? 6 months ago Type 2 diabetes mellitus with diabetic neuropathy, with long-term current use of insulin ([place])  ? [place], [place] T, NP  ? 1 year ago Slow transit constipation  ? [place], Harrah T, NP  ? 1 year ago Type 2 diabetes mellitus with diabetic neuropathy, with long-term current use of insulin ([place])  ? [place], Henrine Screws T, NP  ? 1 year ago Acute gastroenteritis  ? [facility] [place], Henrine Screws T, NP  ?  ?  ?Future Appointments   ?        ? In 2 weeks Cannady, Barbaraann Faster, NP [employer], [facility]  ? In 4 months  [place]  ?  ? ?  ?  ?  ? ? ?

## 2021-08-24 ENCOUNTER — Encounter: Payer: Self-pay | Admitting: Ophthalmology

## 2021-08-28 NOTE — Discharge Instructions (Signed)

## 2021-08-31 ENCOUNTER — Ambulatory Visit: Payer: Medicare Other | Admitting: Anesthesiology

## 2021-08-31 ENCOUNTER — Other Ambulatory Visit: Payer: Self-pay

## 2021-08-31 ENCOUNTER — Encounter
Admission: RE | Disposition: A | Discharge status: Home / Self Care | Payer: Self-pay | Source: Home / Self Care | Attending: Ophthalmology

## 2021-08-31 ENCOUNTER — Ambulatory Visit
Admission: RE | Admit: 2021-08-31 | Discharge: 2021-08-31 | Disposition: A | Discharge status: Home / Self Care | Payer: Medicare Other | Attending: Ophthalmology | Admitting: Ophthalmology

## 2021-08-31 DIAGNOSIS — E1136 Type 2 diabetes mellitus with diabetic cataract: Secondary | ICD-10-CM | POA: Insufficient documentation

## 2021-08-31 DIAGNOSIS — E890 Postprocedural hypothyroidism: Secondary | ICD-10-CM | POA: Insufficient documentation

## 2021-08-31 DIAGNOSIS — H2512 Age-related nuclear cataract, left eye: Secondary | ICD-10-CM | POA: Diagnosis not present

## 2021-08-31 DIAGNOSIS — H25812 Combined forms of age-related cataract, left eye: Secondary | ICD-10-CM | POA: Diagnosis not present

## 2021-08-31 DIAGNOSIS — J45909 Unspecified asthma, uncomplicated: Secondary | ICD-10-CM | POA: Insufficient documentation

## 2021-08-31 DIAGNOSIS — E1122 Type 2 diabetes mellitus with diabetic chronic kidney disease: Secondary | ICD-10-CM | POA: Diagnosis not present

## 2021-08-31 DIAGNOSIS — N182 Chronic kidney disease, stage 2 (mild): Secondary | ICD-10-CM | POA: Insufficient documentation

## 2021-08-31 DIAGNOSIS — I129 Hypertensive chronic kidney disease with stage 1 through stage 4 chronic kidney disease, or unspecified chronic kidney disease: Secondary | ICD-10-CM | POA: Insufficient documentation

## 2021-08-31 HISTORY — PX: CATARACT EXTRACTION W/PHACO: SHX586

## 2021-08-31 LAB — GLUCOSE, CAPILLARY
Glucose-Capillary: 135 mg/dL — ABNORMAL HIGH (ref 70–99)
Glucose-Capillary: 120 mg/dL — ABNORMAL HIGH (ref 70–99)

## 2021-08-31 SURGERY — PHACOEMULSIFICATION, CATARACT, WITH IOL INSERTION
Anesthesia: Monitor Anesthesia Care | Site: Eye | Laterality: Left

## 2021-08-31 MED ORDER — SIGHTPATH DOSE#1 SODIUM HYALURONATE 23 MG/ML IO SOLUTION
PREFILLED_SYRINGE | INTRAOCULAR | Status: DC | PRN
  Administered 2021-08-31: 0.6 mL via INTRAOCULAR

## 2021-08-31 MED ORDER — SIGHTPATH DOSE#1 SODIUM HYALURONATE 10 MG/ML IO SOLUTION
PREFILLED_SYRINGE | INTRAOCULAR | Status: DC | PRN
  Administered 2021-08-31: 0.85 mL via INTRAOCULAR

## 2021-08-31 MED ORDER — LACTATED RINGERS IV SOLN: INTRAVENOUS | Status: DC

## 2021-08-31 MED ORDER — MIDAZOLAM HCL 2 MG/2ML IJ SOLN
INTRAMUSCULAR | Status: DC | PRN
  Administered 2021-08-31: 1 mg via INTRAVENOUS

## 2021-08-31 MED ORDER — TETRACAINE HCL 0.5 % OP SOLN
1.0000 [drp] | OPHTHALMIC | Status: AC | PRN
  Administered 2021-08-31 (×3): 1 [drp] via OPHTHALMIC

## 2021-08-31 MED ORDER — ACETAMINOPHEN 325 MG PO TABS: 325.0000 mg | ORAL_TABLET | ORAL | Status: DC | PRN

## 2021-08-31 MED ORDER — SIGHTPATH DOSE#1 BSS IO SOLN
INTRAOCULAR | Status: DC | PRN
  Administered 2021-08-31: 108 mL via OPHTHALMIC

## 2021-08-31 MED ORDER — ACETAMINOPHEN 160 MG/5ML PO SOLN: 325.0000 mg | ORAL | Status: DC | PRN

## 2021-08-31 MED ORDER — FENTANYL CITRATE (PF) 100 MCG/2ML IJ SOLN
INTRAMUSCULAR | Status: DC | PRN
  Administered 2021-08-31: 50 ug via INTRAVENOUS

## 2021-08-31 MED ORDER — SIGHTPATH DOSE#1 BSS IO SOLN
INTRAOCULAR | Status: DC | PRN
  Administered 2021-08-31: 15 mL

## 2021-08-31 MED ORDER — ARMC OPHTHALMIC DILATING DROPS
1.0000 "application " | OPHTHALMIC | Status: AC | PRN
  Administered 2021-08-31 (×3): 1 via OPHTHALMIC

## 2021-08-31 MED ORDER — MOXIFLOXACIN HCL 0.5 % OP SOLN
OPHTHALMIC | Status: DC | PRN
  Administered 2021-08-31: 0.2 mL via OPHTHALMIC

## 2021-08-31 MED ORDER — BALANCED SALT IO SOLN
INTRAOCULAR | Status: DC | PRN
  Administered 2021-08-31: 1 mL via INTRAOCULAR

## 2021-08-31 SURGICAL SUPPLY — 14 items
CATARACT SUITE SIGHTPATH (MISCELLANEOUS) ×2 IMPLANT
DISSECTOR HYDRO NUCLEUS 50X22 (MISCELLANEOUS) ×2 IMPLANT
FEE CATARACT SUITE SIGHTPATH (MISCELLANEOUS) ×1 IMPLANT
GLOVE SURG GAMMEX PI TX LF 7.5 (GLOVE) ×2 IMPLANT
GLOVE SURG SYN 8.5  E (GLOVE) ×1
GLOVE SURG SYN 8.5 E (GLOVE) ×1 IMPLANT
GLOVE SURG SYN 8.5 PF PI (GLOVE) ×1 IMPLANT
LENS IOL ACRYSOF IQ 20.0 (Intraocular Lens) ×1 IMPLANT
NDL FILTER BLUNT 18X1 1/2 (NEEDLE) ×1 IMPLANT
NEEDLE FILTER BLUNT 18X 1/2SAF (NEEDLE) ×1
NEEDLE FILTER BLUNT 18X1 1/2 (NEEDLE) ×1 IMPLANT
SYR 3ML LL SCALE MARK (SYRINGE) ×2 IMPLANT
SYR 5ML LL (SYRINGE) ×2 IMPLANT
WATER STERILE IRR 250ML POUR (IV SOLUTION) ×2 IMPLANT

## 2021-08-31 NOTE — Op Note (Signed)
OPERATIVE NOTE ? ?TERESEA Parks ?[id number] ?08/31/2021 ? ? ?PREOPERATIVE DIAGNOSIS:  Nuclear sclerotic cataract left eye.  H25.12 ?  ?POSTOPERATIVE DIAGNOSIS:    Nuclear sclerotic cataract left eye.   ?  ?PROCEDURE:  Phacoemusification with posterior chamber intraocular lens placement of the left eye  ? ?LENS:   ?Implant Name Type Inv. Item Serial No. Manufacturer Lot No. LRB No. Used Action  ?LENS IOL ACRYSOF IQ 20.0 - [id number] Intraocular Lens LENS IOL ACRYSOF IQ 20.0 [id number] ALCON  Left 1 Implanted  ?    ?Procedure(s) with comments: ?CATARACT EXTRACTION PHACO AND INTRAOCULAR LENS PLACEMENT (IOC) LEFT DIABETIC 7.54 01:00.2 (Left) - Diabetic ? ?AU00T0 +20.0 ?  ?ULTRASOUND TIME: 1 minutes 0 seconds.  CDE 7.54 ?  ?SURGEON:  Benay Pillow, MD, MPH ?  ?ANESTHESIA:  Topical with tetracaine drops augmented with 1% preservative-free intracameral lidocaine. ? ?ESTIMATED BLOOD LOSS: <1 mL ?  ?COMPLICATIONS:  None. ?  ?DESCRIPTION OF PROCEDURE:  The patient was identified in the holding room and transported to the operating room and placed in the supine position under the operating microscope.  The left eye was identified as the operative eye and it was prepped and draped in the usual sterile ophthalmic fashion. ?  ?A 1.0 millimeter clear-corneal paracentesis was made at the 5:00 position. 0.5 ml of preservative-free 1% lidocaine with epinephrine was injected into the anterior chamber. ? The anterior chamber was filled with Healon 5 viscoelastic.  A 2.4 millimeter keratome was used to make a near-clear corneal incision at the 2:00 position.  A curvilinear capsulorrhexis was made with a cystotome and capsulorrhexis forceps.  Balanced salt solution was used to hydrodissect and hydrodelineate the nucleus. ?  ?Phacoemulsification was then used in stop and chop fashion to remove the lens nucleus and epinucleus.  The remaining cortex was then removed using the irrigation and aspiration handpiece. Healon was then placed  into the capsular bag to distend it for lens placement.  A lens was then injected into the capsular bag.  The remaining viscoelastic was aspirated. ?  ?Wounds were hydrated with balanced salt solution.  The anterior chamber was inflated to a physiologic pressure with balanced salt solution. ? ?Intracameral vigamox 0.1 mL undiltued was injected into the eye and a drop placed onto the ocular surface. ? No wound leaks were noted.  The patient was taken to the recovery room in stable condition without complications of anesthesia or surgery ? ?Benay Pillow ?08/31/2021, 10:17 AM ? ?

## 2021-08-31 NOTE — Transfer of Care (Signed)
Immediate Anesthesia Transfer of Care Note ? ?Patient: Mary Parks ? ?Procedure(s) Performed: CATARACT EXTRACTION PHACO AND INTRAOCULAR LENS PLACEMENT ([facility]) LEFT DIABETIC 7.54 01:00.2 (Left: Eye) ? ?Patient Location: PACU ? ?Anesthesia Type: MAC ? ?Level of Consciousness: awake, alert  and patient cooperative ? ?Airway and Oxygen Therapy: Patient Spontanous Breathing and Patient connected to supplemental oxygen ? ?Post-op Assessment: Post-op Vital signs reviewed, Patient's Cardiovascular Status Stable, Respiratory Function Stable, Patent Airway and No signs of Nausea or vomiting ? ?Post-op Vital Signs: Reviewed and stable ? ?Complications: No notable events documented. ? ?

## 2021-08-31 NOTE — Anesthesia Postprocedure Evaluation (Signed)
Anesthesia Post Note ? ?Patient: Mary Parks ? ?Procedure(s) Performed: CATARACT EXTRACTION PHACO AND INTRAOCULAR LENS PLACEMENT (I[facility]) LEFT DIABETIC 7.54 01:00.2 (Left: Eye) ? ? ?  ?Patient location during evaluation: PACU ?Anesthesia Type: MAC ?Level of consciousness: awake and alert ?Pain management: pain level controlled ?Vital Signs Assessment: post-procedure vital signs reviewed and stable ?Respiratory status: spontaneous breathing, nonlabored ventilation, respiratory function stable and patient connected to nasal cannula oxygen ?Cardiovascular status: stable and blood pressure returned to baseline ?Postop Assessment: no apparent nausea or vomiting ?Anesthetic complications: no ? ? ?No notable events documented. ? ?Trecia Rogers ? ? ? ? ? ?

## 2021-08-31 NOTE — H&P (Signed)
[place]  ? ?Primary Care Physician:  Venita Lick, NP ?Ophthalmologist: Dr. Benay Pillow ? ?Pre-Procedure History & Physical: ?HPI:  Mary Parks is a 71 y.o. female here for cataract surgery. ?  ?Past Medical History:  ?Diagnosis Date  ? Asthma   ? Chest pain   ? DM2 (diabetes mellitus, type 2) ([place]) 1987  ? Dyspnea   ? Cath normal coronaries. normal EF. RA 11. PA 37/17 (26) LVEDP 36  ? GERD (gastroesophageal reflux disease)   ? HTN (hypertension)   ? Hyperlipidemia   ? Hypothyroidism   ? Obesity   ? Osteoarthritis   ? knees  ? Renal disease   ? Thyroid disease   ? having thyroid removed 04/14/15  ? Vertigo   ? none recently  ? ? ?Past Surgical History:  ?Procedure Laterality Date  ? CARDIAC CATHETERIZATION    ? CATARACT EXTRACTION W/PHACO Right 03/31/2015  ? Procedure: CATARACT EXTRACTION PHACO AND INTRAOCULAR LENS PLACEMENT (IOC);  Surgeon: Ronnell Freshwater, MD;  Location: [place];  Service: Ophthalmology;  Laterality: Right;  DIABETIC - insulin  ? COLONOSCOPY WITH PROPOFOL N/A 11/12/2016  ? Procedure: COLONOSCOPY WITH PROPOFOL;  Surgeon: [name], MD;  Location: [place];  Service: Endoscopy;  Laterality: N/A;  Diabetic - insulin  ? COLONOSCOPY WITH PROPOFOL N/A 06/15/2021  ? Procedure: COLONOSCOPY WITH BIOPSY;  Surgeon: [name], MD;  Location: [place];  Service: Endoscopy;  Laterality: N/A;  Diabetic  ? EYE SURGERY    ? HERNIA REPAIR    ? umbilical  ? POLYPECTOMY  11/12/2016  ? Procedure: POLYPECTOMY INTESTINAL;  Surgeon: [name], MD;  Location: [place];  Service: Endoscopy;;  ? POLYPECTOMY N/A 06/15/2021  ? Procedure: POLYPECTOMY;  Surgeon: [name], MD;  Location: [place];  Service: Endoscopy;  Laterality: N/A;  ? THYROIDECTOMY N/A 04/14/2015  ? Procedure: THYROIDECTOMY;  Surgeon: Margaretha Sheffield, MD;  Location: [facility];  Service: ENT;  Laterality: N/A;  ? ? ?Prior to Admission medications   ?Medication Sig Start  Date End Date Taking? Authorizing Provider  ?albuterol (VENTOLIN HFA) 108 (90 Base) MCG/ACT inhaler Inhale 2 puffs into the lungs every 6 (six) hours as needed for wheezing or shortness of breath. 01/02/20  Yes Cannady, Jolene T, NP  ?amLODipine (NORVASC) 2.5 MG tablet Take 2.5 mg by mouth daily. 12/06/18  Yes [provider]  ?aspirin EC 81 MG tablet Take 81 mg by mouth daily.   Yes [provider]  ?atorvastatin (LIPITOR) 80 MG tablet Take 1 tablet (80 mg total) by mouth daily. 02/06/21  Yes Venita Lick, NP  ?Bartolo Darter COVID-19 AG HOME TEST KIT Use as Directed on the Package 01/30/21  Yes [provider]  ?budesonide-formoterol (SYMBICORT) 160-4.5 MCG/ACT inhaler Inhale 2 puffs into the lungs 2 (two) times daily. 02/06/21  Yes Cannady, Henrine Screws T, NP  ?calcium-vitamin D (OSCAL WITH D) 500-200 MG-UNIT tablet Take 1 tablet by mouth.   Yes [provider]  ?fluticasone (FLONASE) 50 MCG/ACT nasal spray Use 2 spray(s) in each nostril once daily 05/04/21  Yes Cannady, Jolene T, NP  ?insulin glargine (LANTUS SOLOSTAR) 100 UNIT/ML Solostar Pen INJECT 45 UNITS SUBCUTANEOUSLY IN MORNING AND 40 UNITS AT NIGHT. 02/06/21  Yes Cannady, Jolene T, NP  ?insulin lispro (HUMALOG) 100 UNIT/ML KwikPen INJECT 20 UNITS SUBCTUANEOUSLY BEFORE BREAKFAST AND LUNCH, AND 30 UNITS BEFORE EVENING MEAL. ?Patient taking differently: INJECT 20 UNITS SUBCTUANEOUSLY BEFORE BREAKFAST AND LUNCH, AND 30 UNITS BEFORE EVENING  MEAL. ?(Currently uses 10 units AM, 6 units lunch, 10 units PM  08/24/21) 03/30/21  Yes Cannady, Jolene T, NP  ?ketoconazole (NIZORAL) 2 % cream Apply 1 application topically daily. 06/05/21  Yes Cannady, Henrine Screws T, NP  ?ketoconazole (NIZORAL) 2 % shampoo Apply 1 application topically 2 (two) times a week. 06/08/21  Yes [name] T, NP  ?levothyroxine (SYNTHROID) 88 MCG tablet Take 88 mcg by mouth every morning. 08/13/19  Yes [provider]  ?lisinopril (ZESTRIL) 30 MG tablet Take 30 mg by  mouth daily. 03/30/21  Yes [provider]  ?meclizine (ANTIVERT) 12.5 MG tablet Take 1 tablet (12.5 mg total) by mouth 3 (three) times daily as needed for dizziness. 02/06/21  Yes Cannady, Henrine Screws T, NP  ?metoprolol tartrate (LOPRESSOR) 25 MG tablet Take 25 mg by mouth 2 (two) times daily. 07/09/16  Yes [provider]  ?montelukast (SINGULAIR) 10 MG tablet Take 1 tablet (10 mg total) by mouth at bedtime. 02/06/21  Yes Cannady, Jolene T, NP  ?ONE TOUCH ULTRA TEST test strip 1 each by Other route 2 (two) times daily.  03/15/15  Yes [provider]  ?Jonetta Speak LANCETS 60Y MISC 2 (two) times daily.  03/15/15  Yes [provider]  ?pantoprazole (PROTONIX) 40 MG tablet Take 1 tablet (40 mg total) by mouth daily. 06/05/21  Yes Cannady, Henrine Screws T, NP  ?polyethylene glycol-electrolytes (NULYTELY) 420 g solution Take by mouth. 06/02/21  Yes [provider]  ?TRULICITY 1.5 TK/1.6WF SOPN INJECT 1 SYRINGE SUBCUTANEOUSLY ONCE A WEEK 08/18/21  Yes Cannady, Jolene T, NP  ?SEMGLEE, YFGN, 100 UNIT/ML Pen INJECT 40 UNITS SUBCUTANEOUSLY TWICE DAILY ?Patient not taking: Reported on 08/24/2021 01/09/21   Venita Lick, NP  ? ? ?Allergies as of 08/10/2021 - Review Complete 07/20/2021  ?Allergen Reaction Noted  ? Celebrex [celecoxib]  11/04/2014  ? Sulfa antibiotics  11/23/2014  ? Elemental sulfur Itching 11/05/2014  ? Sulfisoxazole Rash 11/04/2014  ? ? ?Family History  ?Problem Relation Age of Onset  ? Diabetes Mother   ? Diabetes Father   ? Diabetes Sister   ? Diabetes Brother   ? Diabetes Sister   ? Diabetes Sister   ? Diabetes Maternal Grandmother   ? Diabetes Maternal Grandfather   ? Diabetes Paternal Grandmother   ? Diabetes Paternal Grandfather   ? Heart disease Neg Hx   ? Coronary artery disease Neg Hx   ? Breast cancer Neg Hx   ? ? ?Social History  ? ?Socioeconomic History  ? Marital status: Widowed  ?  Spouse name: Not on file  ? Number of children: Not on file  ? Years of education:  Not on file  ? Highest education level: Some college, no degree  ?Occupational History  ? 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  Employer: OTHER  ? Occupation: retired  ?Tobacco Use  ? Smoking status: Never  ? Smokeless tobacco: Never  ?Vaping Use  ? Vaping Use: Never used  ?Substance and Sexual Activity  ? Alcohol use: No  ? Drug use: No  ? Sexual activity: Not Currently  ?Other Topics Concern  ? Not on file  ?Social History Narrative  ? Not on file  ? ?Social Determinants of Health  ? ?Financial Resource Strain: Low Risk   ? Difficulty of Paying Living Expenses: Not hard at all  ?Food Insecurity: No Food Insecurity  ? Worried About Charity fundraiser in the Last Year: Never true  ? Ran Out  of Food in the Last Year: Never true  ?Transportation Needs: No Transportation Needs  ? Lack of Transportation (Medical): No  ? Lack of Transportation (Non-Medical): No  ?Physical Activity: Inactive  ? Days of Exercise per Week: 0 days  ? Minutes of Exercise per Session: 0 min  ?Stress: No Stress Concern Present  ? Feeling of Stress : Not at all  ?Social Connections: Not on file  ?Intimate Partner Violence: Not on file  ? ? ?Review of Systems: ?See HPI, otherwise negative ROS ? ?Physical Exam: ?BP (!) 147/74   Pulse 75   Temp (!) 97.3 ?F (36.3 ?C) (Temporal)   Resp 20   Ht $R'5\' 4"'cV$  (1.626 m)   Wt 80.3 kg   LMP  (LMP Unknown)   SpO2 100%   BMI 30.38 kg/m?  ?General:   Alert, cooperative in NAD ?Head:  Normocephalic and atraumatic. ?Respiratory:  Normal work of breathing. ?Cardiovascular:  RRR ? ?Impression/Plan: ?Mary Parks is here for cataract surgery. ? ?Risks, benefits, limitations, and alternatives regarding cataract surgery have been reviewed with the patient.  Questions have been answered.  All parties agreeable. ? ? ?Benay Pillow, MD  08/31/2021, 9:50 AM ? ? ?

## 2021-08-31 NOTE — Anesthesia Preprocedure Evaluation (Signed)
Anesthesia Evaluation  ?Patient identified by MRN, date of birth, ID band ?Patient awake ? ? ? ?Reviewed: ?Allergy & Precautions, H&P , NPO status , Patient's Chart, lab work & pertinent test results, reviewed documented beta blocker date and time  ? ?Airway ?Mallampati: II ? ?TM Distance: >3 FB ?Neck ROM: full ? ? ? Dental ?no notable dental hx. ? ?  ?Pulmonary ?asthma ,  ?  ?Pulmonary exam normal ?breath sounds clear to auscultation ? ? ? ? ? ? Cardiovascular ?Exercise Tolerance: Good ?hypertension, Normal cardiovascular exam ?Rhythm:regular Rate:Normal ? ?Hx of normal cath ?  ?Neuro/Psych ?negative neurological ROS ? negative psych ROS  ? GI/Hepatic ?negative GI ROS, Neg liver ROS,   ?Endo/Other  ?diabetes, Type 2Hypothyroidism  ? Renal/GU ?CRFRenal disease  ?negative genitourinary ?  ?Musculoskeletal ? ? Abdominal ?  ?Peds ? Hematology ?negative hematology ROS ?(+)   ?Anesthesia Other Findings ? ? Reproductive/Obstetrics ?negative OB ROS ? ?  ? ? ? ? ? ? ? ? ? ? ? ? ? ?  ?  ? ? ? ? ? ? ? ? ?Anesthesia Physical ?Anesthesia Plan ? ?ASA: 3 ? ?Anesthesia Plan: MAC  ? ?Post-op Pain Management:   ? ?Induction:  ? ?PONV Risk Score and Plan:  ? ?Airway Management Planned:  ? ?Additional Equipment:  ? ?Intra-op Plan:  ? ?Post-operative Plan:  ? ?Informed Consent: I have reviewed the patients History and Physical, chart, labs and discussed the procedure including the risks, benefits and alternatives for the proposed anesthesia with the patient or authorized representative who has indicated his/her understanding and acceptance.  ? ? ? ?Dental Advisory Given ? ?Plan Discussed with: CRNA and Anesthesiologist ? ?Anesthesia Plan Comments:   ? ? ? ? ? ? ?Anesthesia Quick Evaluation ? ?

## 2021-08-31 NOTE — Anesthesia Procedure Notes (Signed)
Procedure Name: [place] ?Date/Time: 08/31/2021 9:58 AM ?Performed by: Mayme Genta, CRNA ?Pre-anesthesia Checklist: Patient identified, Emergency Drugs available, Suction available, Timeout performed and Patient being monitored ?Patient Re-evaluated:Patient Re-evaluated prior to induction ?Oxygen Delivery Method: Nasal cannula ?Placement Confirmation: positive ETCO2 ? ? ? ? ?

## 2021-09-01 ENCOUNTER — Encounter: Payer: Self-pay | Admitting: Ophthalmology

## 2021-09-02 DIAGNOSIS — E1165 Type 2 diabetes mellitus with hyperglycemia: Secondary | ICD-10-CM | POA: Diagnosis not present

## 2021-09-02 DIAGNOSIS — Z794 Long term (current) use of insulin: Secondary | ICD-10-CM | POA: Diagnosis not present

## 2021-09-03 ENCOUNTER — Ambulatory Visit: Payer: Medicare Other | Admitting: Nurse Practitioner

## 2021-09-25 DIAGNOSIS — Z961 Presence of intraocular lens: Secondary | ICD-10-CM | POA: Diagnosis not present

## 2021-09-28 ENCOUNTER — Ambulatory Visit (INDEPENDENT_AMBULATORY_CARE_PROVIDER_SITE_OTHER): Payer: Medicare Other

## 2021-09-28 DIAGNOSIS — E039 Hypothyroidism, unspecified: Secondary | ICD-10-CM | POA: Diagnosis not present

## 2021-09-28 DIAGNOSIS — I10 Essential (primary) hypertension: Secondary | ICD-10-CM | POA: Diagnosis not present

## 2021-09-28 DIAGNOSIS — T7840XD Allergy, unspecified, subsequent encounter: Secondary | ICD-10-CM | POA: Diagnosis not present

## 2021-09-28 DIAGNOSIS — E559 Vitamin D deficiency, unspecified: Secondary | ICD-10-CM | POA: Diagnosis not present

## 2021-09-28 DIAGNOSIS — E1140 Type 2 diabetes mellitus with diabetic neuropathy, unspecified: Secondary | ICD-10-CM

## 2021-09-28 DIAGNOSIS — E785 Hyperlipidemia, unspecified: Secondary | ICD-10-CM | POA: Diagnosis not present

## 2021-09-28 DIAGNOSIS — E1165 Type 2 diabetes mellitus with hyperglycemia: Secondary | ICD-10-CM | POA: Diagnosis not present

## 2021-09-28 DIAGNOSIS — N289 Disorder of kidney and ureter, unspecified: Secondary | ICD-10-CM | POA: Diagnosis not present

## 2021-09-28 DIAGNOSIS — K219 Gastro-esophageal reflux disease without esophagitis: Secondary | ICD-10-CM | POA: Diagnosis not present

## 2021-09-28 DIAGNOSIS — E1122 Type 2 diabetes mellitus with diabetic chronic kidney disease: Secondary | ICD-10-CM

## 2021-09-28 NOTE — Progress Notes (Signed)
? ?Chronic Care Management ?Pharmacy Note ? ?09/28/2021 ?Name:  Mary Parks MRN:  [id number] DOB:  April 20, 1951 ? ?Summary: ?05/2021 a1c 9.5%; endo was needing to see pt to review Bgs and discuss trulicity increase to 3 mg once weekly (ok'd by neph)  ?Endo visit now scheduled for today 09/28/2021 11am - plans to review trulicity dose increase. Neph visit later this month, needing PCP f/u  ?No cost concerns - extra help on Rx ?Main concern is dietary changes - HC to send DM diet handouts - will f/u with pt 1-2 months  ?Urgent care 08/2021 - RUQ pain - muscle skeletal pain, given tramadol  ? ? ?Subjective: ?Mary Parks is an 71 y.o. year old female who is a primary patient of Cannady, Barbaraann Faster, NP.  The CCM team was consulted for assistance with disease management and care coordination needs.   ? ?Engaged with patient by telephone for follow up visit in response to provider referral for pharmacy case management and/or care coordination services.  ? ?Consent to Services:  ?The patient was given information about Chronic Care Management services, agreed to services, and gave verbal consent prior to initiation of services.  Please see initial visit note for detailed documentation.  ? ?Patient Care Team: ?Venita Lick, NP as PCP - General (Nurse Practitioner) ?Kshirsagar, Thomasena Edis, MD as Referring Physician (Nephrology) ?Madelin Rear, [facility] (Pharmacist) ? ?Hospital visits: ?None in previous 6 months ? ?Adherence Review: ?Is the patient currently on a STATIN medication? Yes ?Is the patient currently on ACE/ARB medication? Yes ?Does the patient have >5 day gap between last estimated fill dates? No ?  ?  ?Care Gaps: ?Zoster Vaccines- Shingrix:Never done ?NIOEV-03 Vaccine:Last completed: Oct 08, 2020 ?INFLUENZA VACCINE:Last completed: Feb 28, 2020 ?  ?Star Rating Drugs: ?Atorvastatin 80 mg Last filled:04/09/21 90 DS, 07/09/21 90 ds ?Lisinopril 20 mg Last filled:03/30/21 90 DS, 06/24/21 90 ds ?Trulciity 1.5 mg Last  filled:04/17/21 28 DS, 08/12/21 94 DS ? ?Objective: ? ?Lab Results  ?Component Value Date  ? CREATININE 1.04 (H) 07/20/2021  ? CREATININE 1.20 (H) 02/06/2021  ? CREATININE 1.25 (H) 07/16/2020  ? ? ?Lab Results  ?Component Value Date  ? HGBA1C 9.5 (H) 06/05/2021  ? HGBA1C 8.5 (H) 02/06/2021  ? HGBA1C 8.1 (H) 07/16/2020  ? ?Last diabetic Eye exam:  ?Lab Results  ?Component Value Date/Time  ? HMDIABEYEEXA No Retinopathy 08/20/2020 12:00 AM  ?  ?Last diabetic Foot exam:  ?Lab Results  ?Component Value Date/Time  ? HMDIABFOOTEX bilateral- normal 06/28/2016 12:00 AM  ?  ? ?   ?Component Value Date/Time  ? CHOL 145 06/05/2021 1114  ? CHOL 149 02/06/2021 1116  ? CHOL 163 07/16/2020 1122  ? CHOL 157 07/07/2015 1039  ? CHOL 165 03/25/2015 1103  ? TRIG 104 06/05/2021 1114  ? TRIG 144 02/06/2021 1116  ? TRIG 122 07/16/2020 1122  ? TRIG 135 07/07/2015 1039  ? TRIG 84 03/25/2015 1103  ? HDL 42 06/05/2021 1114  ? HDL 40 02/06/2021 1116  ? HDL 41 07/16/2020 1122  ? VLDL 27 07/07/2015 1039  ? [place] 84 06/05/2021 1114  ? [place] 84 02/06/2021 1116  ? LDLCALC 100 (H) 07/16/2020 1122  ? ? ? ?  Latest Ref Rng & Units 07/20/2021  ?  2:20 PM 02/06/2021  ? 11:16 AM 07/16/2020  ? 11:22 AM  ?Hepatic Function  ?Total Protein 6.5 - 8.1 g/dL 7.5   7.0   6.9    ?Albumin 3.5 - 5.0 g/dL 3.7  4.1   4.1    ?AST 15 - 41 U/L $Remo'18   21   20    'xlOly$ ?ALT 0 - 44 U/L $Remo'16   20   17    'PcwmM$ ?Alk Phosphatase 38 - 126 U/L 76   105   111    ?Total Bilirubin 0.3 - 1.2 mg/dL 0.7   0.4   0.4    ? ? ?Lab Results  ?Component Value Date/Time  ? TSH 0.727 06/05/2021 11:14 AM  ? TSH 1.840 07/16/2020 11:22 AM  ? FREET4 1.50 06/05/2021 11:14 AM  ? FREET4 1.43 07/16/2020 11:22 AM  ? ? ? ?  Latest Ref Rng & Units 07/20/2021  ?  2:20 PM 06/05/2021  ? 11:14 AM 07/16/2020  ? 11:22 AM  ?CBC  ?WBC 4.0 - 10.5 K/uL 3.8   3.6   3.3    ?Hemoglobin 12.0 - 15.0 g/dL 12.3   12.5   12.4    ?Hematocrit 36.0 - 46.0 % 38.8   38.7   38.4    ?Platelets 150 - 400 K/uL 194   194   209    ? ? ?Lab Results   ?Component Value Date/Time  ? VD25OH 33.4 06/05/2021 11:14 AM  ? VD25OH 53.8 01/02/2020 12:00 AM  ? ? ?Clinical ASCVD:  ?The 10-year ASCVD risk score (Arnett DK, et al., 2019) is: 27.8% ?  Values used to calculate the score: ?    Age: 71 years ?    Sex: Female ?    Is Non-Hispanic African American: Yes ?    Diabetic: Yes ?    Tobacco smoker: No ?    Systolic Blood Pressure: 833 mmHg ?    Is BP treated: Yes ?    HDL Cholesterol: 42 mg/dL ?    Total Cholesterol: 145 mg/dL   ?Social History  ? ?Tobacco Use  ?Smoking Status Never  ?Smokeless Tobacco Never  ? ?BP Readings from Last 3 Encounters:  ?08/31/21 (!) 151/74  ?07/20/21 139/80  ?06/15/21 95/63  ? ?Pulse Readings from Last 3 Encounters:  ?08/31/21 70  ?07/20/21 77  ?06/15/21 70  ? ?Wt Readings from Last 3 Encounters:  ?08/31/21 177 lb (80.3 kg)  ?07/20/21 170 lb (77.1 kg)  ?06/15/21 178 lb (80.7 kg)  ? ?BMI Readings from Last 3 Encounters:  ?08/31/21 30.38 kg/m?  ?07/20/21 29.18 kg/m?  ?06/15/21 30.08 kg/m?  ? ? ?Assessment: Review of patient past medical history, allergies, medications, health status, including review of consultants reports, laboratory and other test data, was performed as part of comprehensive evaluation and provision of chronic care management services.  ? ?SDOH:  (Social Determinants of Health) assessments and interventions performed: Yes ?SDOH Interventions   ? ?Flowsheet Row Most Recent Value  ?SDOH Interventions   ?Food Insecurity Interventions Intervention Not Indicated  ?Transportation Interventions Intervention Not Indicated  ? ?  ?  ? ?SDOH Interventions   ? ?Flowsheet Row Most Recent Value  ?SDOH Interventions   ?Food Insecurity Interventions Intervention Not Indicated  ?Transportation Interventions Intervention Not Indicated  ? ?  ? ? ?[place] ? ?Allergies  ?Allergen Reactions  ? Celebrex [Celecoxib]   ?  hallucinations  ? Sulfa Antibiotics   ? Elemental Sulfur Itching  ? Sulfisoxazole Rash  ? ? ?Medications Reviewed Today    ? ? Reviewed by Madelin Rear, [facility] (Pharmacist) on 09/28/21 at [employer] List Status: <None>  ? ?Medication Order Taking? Sig Documenting Provider Last Dose Status Informant  ?albuterol (VENTOLIN HFA)  108 (90 Base) MCG/ACT inhaler [id number]  Inhale 2 puffs into the lungs every 6 (six) hours as needed for wheezing or shortness of breath. [name] T, NP  Active   ?amLODipine (NORVASC) 2.5 MG tablet [id number]  Take 2.5 mg by mouth daily. [provider]  Active   ?aspirin EC 81 MG tablet [id number]  Take 81 mg by mouth daily. [provider]  Active   ?atorvastatin (LIPITOR) 80 MG tablet [id number]  Take 1 tablet (80 mg total) by mouth daily. Venita Lick, NP  Active   ?BINAXNOW COVID-19 AG HOME TEST KIT [id number]  Use as Directed on the Package [provider]  Active   ?budesonide-formoterol (SYMBICORT) 160-4.5 MCG/ACT inhaler [id number]  Inhale 2 puffs into the lungs 2 (two) times daily. [name] T, NP  Active   ?calcium-vitamin D (OSCAL WITH D) 500-200 MG-UNIT tablet [id number]  Take 1 tablet by mouth. [provider]  Active   ?fluticasone (FLONASE) 50 MCG/ACT nasal spray [id number]  Use 2 spray(s) in each nostril once daily Cannady, Jolene T, NP  Active   ?insulin glargine (LANTUS SOLOSTAR) 100 UNIT/ML Solostar Pen [id number]  INJECT 45 UNITS SUBCUTANEOUSLY IN MORNING AND 40 UNITS AT NIGHT. Venita Lick, NP  Active   ?         ?Med Note Theotis Barrio Jun 15, 2021  6:57 AM) 20IU on 06/14/21  ?insulin lispro (HUMALOG) 100 UNIT/ML KwikPen [id number]  INJECT 20 UNITS SUBCTUANEOUSLY BEFORE BREAKFAST AND LUNCH, AND 30 UNITS BEFORE EVENING MEAL.  ?Patient taking differently: INJECT 20 UNITS SUBCTUANEOUSLY BEFORE BREAKFAST AND LUNCH, AND 30 UNITS BEFORE EVENING MEAL. ?(Currently uses 10 units AM, 6 units lunch, 10 units PM  08/24/21)  ? [name] T, NP  Active   ?ketoconazole (NIZORAL) 2 % cream [id number]  Apply 1 application topically daily. [name]  T, NP  Active   ?ketoconazole (NIZORAL) 2 % shampoo [id number]  Apply 1 application topically 2 (two) times a week. [name] T, NP  Active   ?levothyroxine (SYNTHROID) 88 MCG tablet [id number]  Take 88 mcg by mou

## 2021-09-28 NOTE — Patient Instructions (Signed)
Ms. Gatchel, ? ?Thank you for talking with me today. I have included our care plan/goals in the following pages.  ? ?Please review and call me at [phone] with any questions. ? ?Thanks! ?Edison Nasuti  ? ?Madelin Rear, PharmD ?Clinical Pharmacist  ?[phone] ? ?Care Plan : ccm pharmacy care plan  ?Updates made by Madelin Rear, [facility] since 09/28/2021 12:00 AM  ?  ? ?Problem: HLD CKD HTN Hypothyroidism GERD Obesity Asthma   ?  ? ?Long-Range Goal: disease management   ?Start Date: 06/29/2021  ?Expected End Date: 07/07/2022  ?This Visit's Progress: On track  ?Recent Progress: On track  ?Priority: High  ?Note:   ?Current Barriers:  ?HLD HTN DM CKD 3a A2  ? ?Pharmacist Clinical Goal(s):  ?Patient will achieve control of DM as evidenced by a1c and BG through collaboration with PharmD and provider.  ? ?Interventions: ?1:1 collaboration with Venita Lick, NP regarding development and update of comprehensive plan of care as evidenced by provider attestation and co-signature ?Inter-disciplinary care team collaboration (see longitudinal plan of care) ?Comprehensive medication review performed; medication list updated in electronic medical record ? ?Hypertension (BP goal <120/80 per neph) ?-Controlled ?-At goal, some recent readings <578 systolic. ?137/73 09/28/21 - typically less than 130/80   ?-Current treatment: ?Lisinopril 30 mg once daily ?Appropriate, Effective, Safe, Accessible ?Amlodipine 2.5 mg once daily  ?Appropriate, Effective, Safe, Accessible ?-Medications previously tried: n/a  ?-Current home readings: none provided ?-Current dietary habits: see DM ?-Current exercise habits: see DM ?-Denies hypotensive/hypertensive symptoms ?-Educated on BP goals and benefits of medications for prevention of heart attack, stroke and kidney damage; ?Daily salt intake goal < 2300 mg; ?Exercise goal of 150 minutes per week; ?Importance of home blood pressure monitoring; ?-Counseled to monitor BP at home 1-2x/wk, document, and provide  log at future appointments ?-Counseled on diet and exercise extensively ?Recommended to continue current medication ? ?Diabetes (A1c goal <8%) ?-Uncontrolled - 06/05/2021 a1c 9.5%, increased from 8.5% 5 months ago. Highest a1c of previous 5 years ?-Nephrology: [facility] [place] - sees Gwenith Spitz ?-Endocrinology: [facility] sees Casilda Carls ?-09/2021: GFRs 40s-50s, 90 kg (basal 0.88 units/kg, bolus 0.77 units/kg); reports lower AM 90-130, higher PM in 200s - possibility of over basalization  ?-Current medications: ?Trulicity 1.5 mg once weekly  ?Appropriate, Query Effective ?Lantus 45 units AM 40 units PM ?Appropriate, Query Effective ?Lispro 20 units before breakfast and lunch, 30 units before evening meal ?Appropriate, Query Effective ?-Current home glucose readings ?May 2023 - 90-130 fasting, 200+ evening  ?-Denies hypoglycemic/hyperglycemic symptoms ?-Current meal patterns:  ?May 2023: has cut down on fried foods  ?breakfast: cereal + water  ?lunch: snacks - apple juice, bbq sandwich   ?dinner: not sure ?-Current exercise: no formal exercise ?May 2023: no updates ?-Educated on A1c and blood sugar goals; ?Complications of diabetes including kidney damage, retinal damage, and cardiovascular disease; ?Prevention and management of hypoglycemic episodes; ?-Counseled on diet and exercise extensively ?Recommended to continue current medication ?Recommended for endo to increase trulicity to '3mg'$  every week - needed to see pt - appt 09/28/21 11am; long term goal to reduce insulin/potential for hypoglycemia  ?Assessed patient finances. Receives help on copays - no issues here ?Plan to send dietary handouts to help with DM focused diet. 1 month f/u call to discuss  ? ?Patient Goals/Self-Care Activities ?Patient will:  ?- take medications as prescribed as evidenced by patient report and record review ? ?Medication Assistance: None required.  Patient affirms current coverage  meets  needs. ? ?Patient's preferred pharmacy is: ? ?[facility] [address] ?[place]  [id number] ? ?Pt endorses 100% compliance ? ?Follow Up:  Patient agrees to Care Plan and Follow-up. ?Plan: HC send DM dietary handouts/AVS to patient, CP f/u call 1-2 month.  ?  ? ? ?The patient verbalized understanding of instructions provided today and agreed to receive a MyChart copy of patient instruction and/or educational materials. ?Telephone follow up appointment with pharmacy team member scheduled for: See next appointment with "Care Management Staff" under "What's Next" below.    ?

## 2021-10-03 DIAGNOSIS — E1165 Type 2 diabetes mellitus with hyperglycemia: Secondary | ICD-10-CM | POA: Diagnosis not present

## 2021-10-03 DIAGNOSIS — Z794 Long term (current) use of insulin: Secondary | ICD-10-CM | POA: Diagnosis not present

## 2021-10-12 DIAGNOSIS — I10 Essential (primary) hypertension: Secondary | ICD-10-CM | POA: Diagnosis not present

## 2021-10-12 DIAGNOSIS — N1831 Chronic kidney disease, stage 3a: Secondary | ICD-10-CM | POA: Diagnosis not present

## 2021-10-12 DIAGNOSIS — E1129 Type 2 diabetes mellitus with other diabetic kidney complication: Secondary | ICD-10-CM | POA: Diagnosis not present

## 2021-10-12 DIAGNOSIS — N281 Cyst of kidney, acquired: Secondary | ICD-10-CM | POA: Diagnosis not present

## 2021-10-12 DIAGNOSIS — Z794 Long term (current) use of insulin: Secondary | ICD-10-CM | POA: Diagnosis not present

## 2021-10-12 DIAGNOSIS — R809 Proteinuria, unspecified: Secondary | ICD-10-CM | POA: Diagnosis not present

## 2021-10-15 ENCOUNTER — Telehealth: Payer: Self-pay

## 2021-10-15 ENCOUNTER — Other Ambulatory Visit: Payer: Self-pay | Admitting: Nurse Practitioner

## 2021-10-15 NOTE — Telephone Encounter (Signed)
Patient is overdue for appointment. Please call and schedule a follow up with Jolene. Patient was due for a 3 month f/up in April.

## 2021-10-15 NOTE — Patient Instructions (Signed)

## 2021-10-15 NOTE — Telephone Encounter (Signed)
Patient scheduled tomorrow @ 1120 a

## 2021-10-16 ENCOUNTER — Ambulatory Visit (INDEPENDENT_AMBULATORY_CARE_PROVIDER_SITE_OTHER): Payer: Medicare Other | Admitting: Nurse Practitioner

## 2021-10-16 ENCOUNTER — Encounter: Payer: Self-pay | Admitting: Nurse Practitioner

## 2021-10-16 VITALS — BP 112/69 | HR 76 | Temp 98.0°F | Ht 64.0 in | Wt 174.2 lb

## 2021-10-16 DIAGNOSIS — E0822 Diabetes mellitus due to underlying condition with diabetic chronic kidney disease: Secondary | ICD-10-CM | POA: Diagnosis not present

## 2021-10-16 DIAGNOSIS — Z794 Long term (current) use of insulin: Secondary | ICD-10-CM

## 2021-10-16 DIAGNOSIS — N1831 Chronic kidney disease, stage 3a: Secondary | ICD-10-CM

## 2021-10-16 DIAGNOSIS — R700 Elevated erythrocyte sedimentation rate: Secondary | ICD-10-CM | POA: Diagnosis not present

## 2021-10-16 DIAGNOSIS — E1122 Type 2 diabetes mellitus with diabetic chronic kidney disease: Secondary | ICD-10-CM

## 2021-10-16 DIAGNOSIS — E6609 Other obesity due to excess calories: Secondary | ICD-10-CM

## 2021-10-16 DIAGNOSIS — E1169 Type 2 diabetes mellitus with other specified complication: Secondary | ICD-10-CM | POA: Diagnosis not present

## 2021-10-16 DIAGNOSIS — E890 Postprocedural hypothyroidism: Secondary | ICD-10-CM

## 2021-10-16 DIAGNOSIS — E785 Hyperlipidemia, unspecified: Secondary | ICD-10-CM

## 2021-10-16 DIAGNOSIS — I129 Hypertensive chronic kidney disease with stage 1 through stage 4 chronic kidney disease, or unspecified chronic kidney disease: Secondary | ICD-10-CM

## 2021-10-16 DIAGNOSIS — E1140 Type 2 diabetes mellitus with diabetic neuropathy, unspecified: Secondary | ICD-10-CM | POA: Diagnosis not present

## 2021-10-16 DIAGNOSIS — D729 Disorder of white blood cells, unspecified: Secondary | ICD-10-CM

## 2021-10-16 DIAGNOSIS — Z6830 Body mass index (BMI) 30.0-30.9, adult: Secondary | ICD-10-CM

## 2021-10-16 LAB — BAYER DCA HB A1C WAIVED: HB A1C (BAYER DCA - WAIVED): 9.2 % — ABNORMAL HIGH (ref 4.8–5.6)

## 2021-10-16 MED ORDER — LANTUS SOLOSTAR 100 UNIT/ML ~~LOC~~ SOPN: PEN_INJECTOR | SUBCUTANEOUS | 5 refills | Status: DC

## 2021-10-16 MED ORDER — TRULICITY 1.5 MG/0.5ML ~~LOC~~ SOAJ: SUBCUTANEOUS | 4 refills | Status: DC

## 2021-10-16 MED ORDER — INSULIN LISPRO (1 UNIT DIAL) 100 UNIT/ML (KWIKPEN): PEN_INJECTOR | SUBCUTANEOUS | 5 refills | Status: DC

## 2021-10-16 NOTE — Assessment & Plan Note (Signed)
Chronic, stable with BP at goal today in office.  Continue current medication regimen, Lisinopril for kidney protection with diabetes, and collaboration with [facility] nephrology.  Recommend she monitor BP at home at least 3 mornings a week.  LABS: up to date with nephrology.  Focus on DASH diet at home.  Return in 6 months.

## 2021-10-16 NOTE — Assessment & Plan Note (Signed)
Noted on recent labs, recheck today along with ESR, CRP, ANA.

## 2021-10-16 NOTE — Assessment & Plan Note (Signed)
Noted on labs 06/05/21 -- recheck today along with ANA, CRP, ESR.

## 2021-10-16 NOTE — Progress Notes (Signed)
BP 112/69   Pulse 76   Temp 98 F (36.7 C) (Oral)   Ht '5\' 4"'$  (1.626 m)   Wt 174 lb 3.2 oz (79 kg)   LMP  (LMP Unknown)   SpO2 98%   BMI 29.90 kg/m    Subjective:    Patient ID: Mary Parks, female    DOB: 17-Mar-1951, 71 y.o.   MRN: [id number]  HPI: Mary Parks is a 71 y.o. female  Chief Complaint  Patient presents with   Diabetes    Patient states she recently had Cataract surgery on her left eye. Patient denies having a recent Diabetic Eye Exam.    Hyperlipidemia   Hypertension   Hypothyroidism   Chronic Kidney Disease   DIABETES Followed by Dr. Rosario Jacks, saw last week -- she reports her A1c was 9.1%.  A1c in January by PCP was 9.5%, endo records not available. Currently taking Trulicity 1.5 MG weekly and Lantus (40 units morning and 40 units at night) and Humalog 10 in morning, 10 lunch, and 6 in evening + Farxiga 10 MG. Review of chart over past years  A1C has remained in 8-9 range.   Hypoglycemic episodes: none Polydipsia/polyuria: no Visual disturbance: no Chest pain: no Paresthesias: no Glucose Monitoring: yes             Accucheck frequency: BID             Fasting glucose: 110 - 140 range             Post prandial:             Evening: 200 range             Before meals: Taking Insulin?: yes             Long acting insulin: as above             Short acting insulin: as above Blood Pressure Monitoring: a few times a week Retinal Examination: Not Up To Date -- [facility] [place], Dr. Wyatt Portela Foot Exam: Up to Date Pneumovax: Up to Date Influenza: Up to Date Aspirin: yes  HYPOTHYROIDISM Followed by endocrinology, taking 88 MCG Levothyroxine.  Unable to view note in Epic.  Recent labs noted WBC slightly low and has history of elevation of ESR. Thyroid control status:stable Satisfied with current treatment? yes Medication side effects: no Medication compliance: good compliance Etiology of hypothyroidism:  Recent dose adjustment:no Fatigue: no Cold  intolerance: yes Heat intolerance: no Weight gain: no Weight loss: no Constipation: no Diarrhea/loose stools: no Palpitations: no Lower extremity edema: no Anxiety/depressed mood: no   CHRONIC KIDNEY DISEASE Nephrology at [facility], last saw in 10/12/21  -- Wilder Glade started. CKD status: stable Medications renally dose: yes Previous renal evaluation: yes Pneumovax:  Up to Date Influenza Vaccine:  Up to Date   HYPERTENSION / HYPERLIPIDEMIA Current medications include Metoprolol, Lisinopril, and Amlodipine for HTN + Lipitor for HLD.   Satisfied with current treatment? yes Duration of hypertension: chronic BP monitoring frequency: occasional BP range: 110-130/80 BP medication side effects: no Duration of hyperlipidemia: chronic Cholesterol medication side effects: no Cholesterol supplements: none Medication compliance: good compliance Aspirin: yes Recent stressors: no Recurrent headaches: no Visual changes: no Palpitations: no Dyspnea: no Chest pain: no Lower extremity edema: no Dizzy/lightheaded: no   Relevant past medical, surgical, family and social history reviewed and updated as indicated. Interim medical history since our last visit reviewed. Allergies and medications reviewed and updated.  Review of Systems  Constitutional:  Negative for activity change, appetite change, diaphoresis, fatigue and fever.  Respiratory:  Negative for cough, chest tightness and shortness of breath.   Cardiovascular:  Negative for chest pain, palpitations and leg swelling.  Gastrointestinal: Negative.   Endocrine: Negative for cold intolerance, heat intolerance, polydipsia, polyphagia and polyuria.  Neurological: Negative.   Psychiatric/Behavioral: Negative.     Per HPI unless specifically indicated above     Objective:    BP 112/69   Pulse 76   Temp 98 F (36.7 C) (Oral)   Ht $R'5\' 4"'Tz$  (1.626 m)   Wt 174 lb 3.2 oz (79 kg)   LMP  (LMP Unknown)   SpO2 98%   BMI 29.90 kg/m   Wt  Readings from Last 3 Encounters:  10/16/21 174 lb 3.2 oz (79 kg)  08/31/21 177 lb (80.3 kg)  07/20/21 170 lb (77.1 kg)    Physical Exam Vitals and nursing note reviewed.  Constitutional:      General: She is awake. She is not in acute distress.    Appearance: She is well-developed and well-groomed. She is obese. She is not ill-appearing.  HENT:     Head: Normocephalic.     Right Ear: Hearing and external ear normal. No drainage.     Left Ear: Hearing and external ear normal. No drainage.  Eyes:     General: Lids are normal.        Right eye: No discharge.        Left eye: No discharge.     Conjunctiva/sclera: Conjunctivae normal.     Pupils: Pupils are equal, round, and reactive to light.  Neck:     Vascular: No carotid bruit.  Cardiovascular:     Rate and Rhythm: Normal rate and regular rhythm.     Heart sounds: Normal heart sounds.  Pulmonary:     Effort: Pulmonary effort is normal. No accessory muscle usage or respiratory distress.     Breath sounds: Normal breath sounds.  Abdominal:     General: Bowel sounds are normal. There is no distension.     Palpations: Abdomen is soft.     Hernia: A hernia is present. Hernia is present in the umbilical area.  Musculoskeletal:     Cervical back: Normal range of motion and neck supple.     Right lower leg: No edema.     Left lower leg: No edema.  Lymphadenopathy:     Cervical: No cervical adenopathy.  Skin:    General: Skin is warm and dry.  Neurological:     Mental Status: She is alert and oriented to person, place, and time.     Motor: Motor function is intact.     Coordination: Coordination is intact.     Gait: Gait is intact.     Deep Tendon Reflexes:     Reflex Scores:      Brachioradialis reflexes are 2+ on the right side and 2+ on the left side.      Patellar reflexes are 2+ on the right side and 2+ on the left side. Psychiatric:        Attention and Perception: Attention normal.        Mood and Affect: Mood normal.         Speech: Speech normal.        Behavior: Behavior normal. Behavior is cooperative.        Thought Content: Thought content normal.   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  Continue current medication regimen, Lisinopril for kidney protection with diabetes, and collaboration with [facility] nephrology.  Recommend she monitor BP at home at least 3 mornings a week.  LABS: up to date with nephrology.  Focus on DASH diet at home.  Return in 6 months.       Relevant Medications   FARXIGA 10 MG TABS tablet   insulin glargine (LANTUS SOLOSTAR) 100 UNIT/ML Solostar Pen   insulin lispro (HUMALOG) 100 UNIT/ML KwikPen   Dulaglutide (TRULICITY) 1.5 ME/2.6ST SOPN   Other Relevant Orders   Bayer DCA Hb A1c Waived (Completed)     Endocrine   Diabetes mellitus with chronic kidney disease (HCC)    Chronic, ongoing, followed by endocrinology.  A1C 9.2% today, recommend continued focus on diet changes.  Urine ALB elevated with nephrology on 10/12/21.  Continue current medication regimen and collaboration with endocrinology and nephrology. CMP up to date, continue Lisinopril for kidney protection.  Will work on getting endocrinology notes.      Relevant Medications   FARXIGA 10 MG TABS tablet   insulin glargine (LANTUS SOLOSTAR) 100 UNIT/ML Solostar Pen   insulin lispro (HUMALOG) 100 UNIT/ML KwikPen   Dulaglutide (TRULICITY) 1.5 MH/9.6QI SOPN   Other Relevant Orders   Bayer DCA Hb A1c Waived (Completed)   Hyperlipidemia associated with type 2 diabetes mellitus (HCC)    Chronic,  ongoing.  Continue current medication regimen and adjust as needed.  Lipid panel today.      Relevant Medications   FARXIGA 10 MG TABS tablet   insulin glargine (LANTUS SOLOSTAR) 100 UNIT/ML Solostar Pen   insulin lispro (HUMALOG) 100 UNIT/ML KwikPen   Dulaglutide (TRULICITY) 1.5 WL/7.9GX SOPN   Other Relevant Orders   Bayer DCA Hb A1c Waived (Completed)   Lipid Panel w/o Chol/HDL Ratio   Hypothyroidism    S/P thyroidectomy years ago.  Followed by Dr. Rosario Jacks with endo.  Continue current medication regimen and adjust as needed.  Will work on getting endocrinology notes.        Type 2 diabetes mellitus with diabetic neuropathy, with long-term current use of insulin (HCC) - Primary    Chronic, ongoing, followed by endocrinology.  A1C 9.2% today and urine ALB up to date with [facility].  Continue current medication regimen and collaboration with endocrinology.  CMP up to date, continue Lisinopril for kidney protection.  Recommend patient continue to monitor BS at home.  Heavy focus on diet recommended.  Eye exam due and recommend she obtain.  Foot exam up to date.  Will work on getting endocrinology notes. Return in 6 months.       Relevant Medications   FARXIGA 10 MG TABS tablet   insulin glargine (LANTUS SOLOSTAR) 100 UNIT/ML Solostar Pen   insulin lispro (HUMALOG) 100 UNIT/ML KwikPen   Dulaglutide (TRULICITY) 1.5 QJ/1.9ER SOPN   Other Relevant Orders   Bayer DCA Hb A1c Waived (Completed)     Genitourinary   Chronic kidney disease, stage 3a (HCC)    Chronic,  ongoing.  Continue collaboration with [facility] nephrology.  Lisinopril for kidney protection. CMP up to date with [facility].         Other   Abnormal white blood cell count    Noted on recent labs, recheck today along with ESR, CRP, ANA.       Relevant Orders   CBC with Differential/Platelet   Sed Rate (ESR)   C-reactive protein   Elevated erythrocyte sedimentation rate    Noted on labs 06/05/21 -- recheck today along with ANA, CRP,  ESR.       Relevant Orders   CBC with Differential/Platelet   Sed Rate (ESR)   C-reactive protein   ANA 12 Plus Profile (RDL)   Obesity    Recommended eating smaller high protein, low fat meals more frequently and exercising 30 mins a day 5 times a week with a goal of 10-15lb weight loss in the next 3 months. Patient voiced their understanding and motivation to adhere to these recommendations.        Relevant Medications   FARXIGA 10 MG TABS tablet   insulin glargine (LANTUS SOLOSTAR) 100 UNIT/ML Solostar Pen   insulin lispro (HUMALOG) 100 UNIT/ML KwikPen   Dulaglutide (TRULICITY) 1.5 UI/1.1OY SOPN     Follow up plan: Return in about 6 months (around 04/18/2022) for T2DM, HTN/HLD, ASTHMA, CKD,  GERD.

## 2021-10-16 NOTE — Assessment & Plan Note (Signed)
Recommended eating smaller high protein, low fat meals more frequently and exercising 30 mins a day 5 times a week with a goal of 10-15lb weight loss in the next 3 months. Patient voiced their understanding and motivation to adhere to these recommendations.  

## 2021-10-16 NOTE — Assessment & Plan Note (Signed)
Chronic, ongoing, followed by endocrinology.  A1C 9.2% today and urine ALB up to date with [facility].  Continue current medication regimen and collaboration with endocrinology.  CMP up to date, continue Lisinopril for kidney protection.  Recommend patient continue to monitor BS at home.  Heavy focus on diet recommended.  Eye exam due and recommend she obtain.  Foot exam up to date.  Will work on getting endocrinology notes. Return in 6 months.

## 2021-10-16 NOTE — Assessment & Plan Note (Signed)
S/P thyroidectomy years ago.  Followed by Dr. Jadali with endo.  Continue current medication regimen and adjust as needed.  Will work on getting endocrinology notes.  

## 2021-10-16 NOTE — Assessment & Plan Note (Signed)
Chronic, ongoing.  Continue collaboration with [facility] nephrology.  Lisinopril for kidney protection. CMP up to date with [facility]. 

## 2021-10-16 NOTE — Assessment & Plan Note (Signed)
Chronic, ongoing, followed by endocrinology.  A1C 9.2% today, recommend continued focus on diet changes.  Urine ALB elevated with nephrology on 10/12/21.  Continue current medication regimen and collaboration with endocrinology and nephrology. CMP up to date, continue Lisinopril for kidney protection.  Will work on getting endocrinology notes.

## 2021-10-16 NOTE — Assessment & Plan Note (Signed)
Chronic, ongoing.  Continue current medication regimen and adjust as needed. Lipid panel today. 

## 2021-10-17 LAB — CBC WITH DIFFERENTIAL/PLATELET
Basophils Absolute: 0 10*3/uL (ref 0.0–0.2)
Basos: 1 %
EOS (ABSOLUTE): 0.1 10*3/uL (ref 0.0–0.4)
Eos: 4 %
Hematocrit: 39.1 % (ref 34.0–46.6)
Hemoglobin: 12.7 g/dL (ref 11.1–15.9)
Immature Grans (Abs): 0 10*3/uL (ref 0.0–0.1)
Immature Granulocytes: 0 %
Lymphocytes Absolute: 1 10*3/uL (ref 0.7–3.1)
Lymphs: 31 %
MCH: 27.1 pg (ref 26.6–33.0)
MCHC: 32.5 g/dL (ref 31.5–35.7)
MCV: 83 fL (ref 79–97)
Monocytes Absolute: 0.2 10*3/uL (ref 0.1–0.9)
Monocytes: 7 %
Neutrophils Absolute: 1.9 10*3/uL (ref 1.4–7.0)
Neutrophils: 57 %
Platelets: 220 10*3/uL (ref 150–450)
RBC: 4.69 x10E6/uL (ref 3.77–5.28)
RDW: 14.4 % (ref 11.7–15.4)
WBC: 3.4 10*3/uL (ref 3.4–10.8)

## 2021-10-17 LAB — LIPID PANEL W/O CHOL/HDL RATIO
Cholesterol, Total: 162 mg/dL (ref 100–199)
HDL: 42 mg/dL (ref >39)
LDL Chol Calc (NIH): 100 mg/dL — ABNORMAL HIGH (ref 0–99)
Triglycerides: 108 mg/dL (ref 0–149)
VLDL Cholesterol Cal: 20 mg/dL (ref 5–40)

## 2021-10-17 LAB — SEDIMENTATION RATE: Sed Rate: 24 mm/hr (ref 0–40)

## 2021-10-17 LAB — C-REACTIVE PROTEIN: CRP: 3 mg/L (ref 0–10)

## 2021-10-18 ENCOUNTER — Other Ambulatory Visit: Payer: Self-pay | Admitting: Nurse Practitioner

## 2021-10-18 MED ORDER — ROSUVASTATIN CALCIUM 40 MG PO TABS: 40.0000 mg | ORAL_TABLET | Freq: Every day | ORAL | 4 refills | Status: DC

## 2021-10-18 NOTE — Progress Notes (Signed)
Good morning crew, please let Mialani know labs have returned: - Cholesterol labs are above goal, I am going to stop her Atorvastatin and change her to Rosuvastatin to see if we can get better control on level and get LDL consistently under 70.  Stop Atorvastatin and start the Rosuvastatin I send in please. - Remainder of labs are all stable thus far, waiting on one more lab.  Any questions? Keep being stellar!!  Thank you for allowing me to participate in your care.  I appreciate you. Kindest regards, Margarito Dehaas

## 2021-10-21 ENCOUNTER — Telehealth: Payer: Self-pay

## 2021-10-21 DIAGNOSIS — I10 Essential (primary) hypertension: Secondary | ICD-10-CM

## 2021-10-21 DIAGNOSIS — Z794 Long term (current) use of insulin: Secondary | ICD-10-CM | POA: Diagnosis not present

## 2021-10-21 DIAGNOSIS — E1169 Type 2 diabetes mellitus with other specified complication: Secondary | ICD-10-CM | POA: Diagnosis not present

## 2021-10-21 NOTE — Progress Notes (Signed)
I have mailed out the after visit summary visit that the patient had on 09/28/21 with the clinical pharmacist Madelin Rear.   Mary Parks, [name]

## 2021-10-30 LAB — ANA 12 PLUS PROFILE, POSITIVE
Anti-CCP Ab, IgG & IgA (RDL): <20 Units (ref <20)
Anti-Cardiolipin Ab, IgA (RDL): 16 APL U/mL (ref <12)
Anti-Cardiolipin Ab, IgG (RDL): <15 GPL U/mL (ref <15)
Anti-Cardiolipin Ab, IgM (RDL): 14 MPL U/mL (ref <13)
Anti-Centromere Ab (RDL): <1:40 {titer}
Anti-Chromatin Ab, IgG (RDL): <20 Units (ref <20)
Anti-La (SS-B) Ab (RDL): <20 Units (ref <20)
Anti-Ro (SS-A) Ab (RDL): <20 Units (ref <20)
Anti-Scl-70 Ab (RDL): <20 Units (ref <20)
Anti-Sm Ab (RDL): <20 Units (ref <20)
Anti-TPO Ab (RDL): <9 IU/mL (ref <9.0)
Anti-U1 RNP Ab (RDL): <20 Units (ref <20)
Anti-dsDNA Ab by Farr(RDL): <8 IU/mL (ref <8.0)
C3 Complement (RDL): 228 mg/dL — ABNORMAL HIGH (ref 82–167)
C4 Complement (RDL): 54 mg/dL — ABNORMAL HIGH (ref 14–44)
Homogeneous Pattern: 1:640 {titer} — ABNORMAL HIGH
Rheumatoid Factor by Turb RDL: <14 IU/mL (ref <14)

## 2021-10-30 LAB — ANA 12 PLUS PROFILE (RDL): Anti-Nuclear Ab by IFA (RDL): POSITIVE — AB

## 2021-10-31 ENCOUNTER — Other Ambulatory Visit: Payer: Self-pay | Admitting: Nurse Practitioner

## 2021-10-31 DIAGNOSIS — R768 Other specified abnormal immunological findings in serum: Secondary | ICD-10-CM | POA: Insufficient documentation

## 2021-10-31 NOTE — Progress Notes (Signed)
Good morning crew, please let Mary Parks know that her ANA testing to look for autoimmune issues has returned and is positive, along with two other factors in testing.  I am putting a referral through to rheumatology to further have them look at this and offer recommendations.  Any questions? Keep being stellar!!  Thank you for allowing me to participate in your care.  I appreciate you. Kindest regards, Keali Mccraw

## 2021-11-03 DIAGNOSIS — E1165 Type 2 diabetes mellitus with hyperglycemia: Secondary | ICD-10-CM | POA: Diagnosis not present

## 2021-11-03 DIAGNOSIS — Z794 Long term (current) use of insulin: Secondary | ICD-10-CM | POA: Diagnosis not present

## 2021-11-16 ENCOUNTER — Ambulatory Visit (INDEPENDENT_AMBULATORY_CARE_PROVIDER_SITE_OTHER): Payer: Medicare Other

## 2021-11-16 DIAGNOSIS — E1140 Type 2 diabetes mellitus with diabetic neuropathy, unspecified: Secondary | ICD-10-CM

## 2021-11-16 DIAGNOSIS — E1169 Type 2 diabetes mellitus with other specified complication: Secondary | ICD-10-CM

## 2021-11-16 NOTE — Progress Notes (Signed)
Chronic Care Management Pharmacy Note  11/20/2021 Name:  Mary Parks MRN:  [id number] DOB:  1950/07/18  Summary: [name] by endocrinology, Dr Rosario Jacks, waiting to inc Trulicity per patient. May consider lisinopril 30mg ->40mg  as tolerated Appears to be due for DM eye Exam, last 07/2020 per HM Summary  Recently ordered treadmill, plans to start exercising, start at 1-2x/wk 10-15 min.   Subjective: Mary Parks is an 71 y.o. year old female who is a primary patient of Cannady, Barbaraann Faster, NP.  The CCM team was consulted for assistance with disease management and care coordination needs.    Engaged with patient by telephone for follow up visit in response to provider referral for pharmacy case management and/or care coordination services.   Consent to Services:  The patient was given information about Chronic Care Management services, agreed to services, and gave verbal consent prior to initiation of services.  Please see initial visit note for detailed documentation.   Patient Care Team: Venita Lick, NP as PCP - General (Nurse Practitioner) Kshirsagar, Thomasena Edis, MD as Referring Physician (Nephrology) Madelin Rear, [facility] (Pharmacist)  Hospital visits: None in previous 6 months  Adherence Review: Is the patient currently on a STATIN medication? Yes Is the patient currently on ACE/ARB medication? Yes Does the patient have >5 day gap between last estimated fill dates? No     Care Gaps: Zoster Vaccines- Shingrix:Never done COVID-19 Vaccine:Last completed: Oct 08, 2020 INFLUENZA VACCINE:Last completed: Feb 28, 2020   Star Rating Drugs: Atorvastatin 80 mg Last filled:04/09/21 90 DS, 07/09/21 90 ds Lisinopril 20 mg Last filled:03/30/21 90 DS, 06/24/21 90 ds Trulciity 1.5 mg Last filled:04/17/21 28 DS, 08/12/21 94 DS  Objective:  Lab Results  Component Value Date   CREATININE 1.04 (H) 07/20/2021   CREATININE 1.20 (H) 02/06/2021   CREATININE 1.25 (H) 07/16/2020     Lab Results  Component Value Date   HGBA1C 9.2 (H) 10/16/2021   HGBA1C 9.5 (H) 06/05/2021   HGBA1C 8.5 (H) 02/06/2021   Last diabetic Eye exam:  Lab Results  Component Value Date/Time   HMDIABEYEEXA No Retinopathy 08/20/2020 12:00 AM    Last diabetic Foot exam:  Lab Results  Component Value Date/Time   HMDIABFOOTEX bilateral- normal 06/28/2016 12:00 AM        Component Value Date/Time   CHOL 162 10/16/2021 1140   CHOL 145 06/05/2021 1114   CHOL 149 02/06/2021 1116   CHOL 157 07/07/2015 1039   CHOL 165 03/25/2015 1103   TRIG 108 10/16/2021 1140   TRIG 104 06/05/2021 1114   TRIG 144 02/06/2021 1116   TRIG 135 07/07/2015 1039   TRIG 84 03/25/2015 1103   HDL 42 10/16/2021 1140   HDL 42 06/05/2021 1114   HDL 40 02/06/2021 1116   VLDL 27 07/07/2015 1039   LDLCALC 100 (H) 10/16/2021 1140   LDLCALC 84 06/05/2021 1114   LDLCALC 84 02/06/2021 1116       Latest Ref Rng & Units 07/20/2021    2:20 PM 02/06/2021   11:16 AM 07/16/2020   11:22 AM  Hepatic Function  Total Protein 6.5 - 8.1 g/dL 7.5  7.0  6.9   Albumin 3.5 - 5.0 g/dL 3.7  4.1  4.1   AST 15 - 41 U/L 18  21  20    ALT 0 - 44 U/L 16  20  17    Alk Phosphatase 38 - 126 U/L 76  105  111   Total Bilirubin 0.3 - 1.2  mg/dL 0.7  0.4  0.4     Lab Results  Component Value Date/Time   TSH 0.727 06/05/2021 11:14 AM   TSH 1.840 07/16/2020 11:22 AM   FREET4 1.50 06/05/2021 11:14 AM   FREET4 1.43 07/16/2020 11:22 AM       Latest Ref Rng & Units 10/16/2021   11:40 AM 07/20/2021    2:20 PM 06/05/2021   11:14 AM  CBC  WBC 3.4 - 10.8 x10E3/uL 3.4  3.8  3.6   Hemoglobin 11.1 - 15.9 g/dL 12.7  12.3  12.5   Hematocrit 34.0 - 46.6 % 39.1  38.8  38.7   Platelets 150 - 450 x10E3/uL 220  194  194     Lab Results  Component Value Date/Time   VD25OH 33.4 06/05/2021 11:14 AM   VD25OH 53.8 01/02/2020 12:00 AM    Clinical ASCVD:  The 10-year ASCVD risk score (Arnett DK, et al., 2019) is: 19.6%   Values used to calculate  the score:     Age: 42 years     Sex: Female     Is Non-Hispanic African American: Yes     Diabetic: Yes     Tobacco smoker: No     Systolic Blood Pressure: 993 mmHg     Is BP treated: Yes     HDL Cholesterol: 42 mg/dL     Total Cholesterol: 162 mg/dL   Social History   Tobacco Use  Smoking Status Never  Smokeless Tobacco Never   BP Readings from Last 3 Encounters:  10/16/21 112/69  08/31/21 (!) 151/74  07/20/21 139/80   Pulse Readings from Last 3 Encounters:  10/16/21 76  08/31/21 70  07/20/21 77   Wt Readings from Last 3 Encounters:  10/16/21 174 lb 3.2 oz (79 kg)  08/31/21 177 lb (80.3 kg)  07/20/21 170 lb (77.1 kg)   BMI Readings from Last 3 Encounters:  10/16/21 29.90 kg/m  08/31/21 30.38 kg/m  07/20/21 29.18 kg/m    Assessment: Review of patient past medical history, allergies, medications, health status, including review of consultants reports, laboratory and other test data, was performed as part of comprehensive evaluation and provision of chronic care management services.   SDOH:  (Social Determinants of Health) assessments and interventions performed: n/a performed 09/2021  STAR Drugs - on statin, no gaps. N/a for DM adherence on insulin   Care Gaps - needs eye exam        CCM Care Plan  Allergies  Allergen Reactions   Celebrex [Celecoxib]     hallucinations   Sulfa Antibiotics    Elemental Sulfur Itching   Sulfisoxazole Rash    Medications Reviewed Today     Reviewed by Venita Lick, NP (Nurse Practitioner) on 10/16/21 at 1317  Med List Status: <None>   Medication Order Taking? Sig Documenting Provider Last Dose Status Informant  albuterol (VENTOLIN HFA) 108 (90 Base) MCG/ACT inhaler [id number] Yes Inhale 2 puffs into the lungs every 6 (six) hours as needed for wheezing or shortness of breath. [name] T, NP Taking Active   amLODipine (NORVASC) 2.5 MG tablet [id number] Yes Take 2.5 mg by mouth daily. [provider]  Taking Active   aspirin EC 81 MG tablet [id number] Yes Take 81 mg by mouth daily. [provider] Taking Active   atorvastatin (LIPITOR) 80 MG tablet [id number] Yes Take 1 tablet (80 mg total) by mouth daily. Venita Lick, NP Taking Active   BINAXNOW COVID-19 AG HOME TEST KIT [id number] Yes  Use as Directed on the Package [provider] Taking Active   budesonide-formoterol (SYMBICORT) 160-4.5 MCG/ACT inhaler [id number] Yes Inhale 2 puffs into the lungs 2 (two) times daily. Venita Lick, NP Taking Active   calcium-vitamin D (OSCAL WITH D) 500-200 MG-UNIT tablet [id number] Yes Take 1 tablet by mouth. [provider] Taking Active   Dulaglutide (TRULICITY) 1.5 XL/2.4MW SOPN [id number]  INJECT 1 SYRINGE SUBCUTANEOUSLY ONCE A WEEK Cannady, Jolene T, NP  Active   FARXIGA 10 MG TABS tablet [id number] Yes Take 10 mg by mouth daily. [provider] Taking Active   fluticasone (FLONASE) 50 MCG/ACT nasal spray [id number] Yes Use 2 spray(s) in each nostril once daily Cannady, Jolene T, NP Taking Active   insulin glargine (LANTUS SOLOSTAR) 100 UNIT/ML Solostar Pen [id number]  INJECT 45 UNITS SUBCUTANEOUSLY IN MORNING AND 40 UNITS AT NIGHT. [name] T, NP  Active   insulin lispro (HUMALOG) 100 UNIT/ML KwikPen [id number]  INJECT 20 UNITS SUBCTUANEOUSLY BEFORE BREAKFAST AND LUNCH, AND 30 UNITS BEFORE EVENING MEAL. [name] T, NP  Active   ketoconazole (NIZORAL) 2 % cream [id number] Yes Apply 1 application topically daily. [name] T, NP Taking Active   ketoconazole (NIZORAL) 2 % shampoo [id number] Yes Apply 1 application topically 2 (two) times a week. [name] T, NP Taking Active   levothyroxine (SYNTHROID) 88 MCG tablet [id number] Yes Take 88 mcg by mouth every morning. [provider] Taking Active   lisinopril (ZESTRIL) 30 MG tablet [id number] Yes Take 30 mg by mouth daily. [provider] Taking Active   meclizine (ANTIVERT) 12.5 MG  tablet [id number] Yes Take 1 tablet (12.5 mg total) by mouth 3 (three) times daily as needed for dizziness. [name] T, NP Taking Active   metoprolol tartrate (LOPRESSOR) 25 MG tablet [id number] Yes Take 25 mg by mouth 2 (two) times daily. [provider] Taking Active   montelukast (SINGULAIR) 10 MG tablet [id number] Yes Take 1 tablet (10 mg total) by mouth at bedtime. [name] T, NP Taking Active   ONE TOUCH ULTRA TEST test strip [id number] Yes 1 each by Other route 2 (two) times daily.  [provider] Taking Active            Med Note Kelby Aline Oct 24, 2019  3:38 PM)    Jonetta Speak LANCETS 89F MISC [id number] Yes 2 (two) times daily.  [provider] Taking Active            Med Note De [place]   Wed Oct 24, 2019  3:38 PM)    pantoprazole (PROTONIX) 40 MG tablet [id number] Yes Take 1 tablet (40 mg total) by mouth daily. [name] T, NP Taking Active   polyethylene glycol-electrolytes (NULYTELY) 420 g solution [id number] Yes Take by mouth. [provider] Taking Active             Patient Active Problem List   Diagnosis Date Noted   Positive ANA (antinuclear antibody) 10/31/2021   History of colonic polyps    Polyp of descending colon    Elevated erythrocyte sedimentation rate 06/06/2021   Abnormal white blood cell count 06/05/2021   Seborrheic dermatitis 06/05/2021   GERD (gastroesophageal reflux disease) 08/13/2020   Hypothyroidism 04/06/2020   Obesity 10/03/2019   Hyperlipidemia associated with type 2 diabetes mellitus ([place]) 06/29/2017   Benign neoplasm of ascending colon    Benign neoplasm of descending colon    Allergic rhinitis 09/22/2016   OA (osteoarthritis)  of knee 06/25/2016   Type 2 diabetes mellitus with diabetic neuropathy, with long-term current use of insulin ([place]) 10/03/2015   Chronic kidney disease, stage 3a ([place]) 12/17/2014   Hypertension associated with chronic kidney disease due to  type 2 diabetes mellitus ([place]) 12/17/2014   Diabetes mellitus with chronic kidney disease ([place]) 11/28/2014   Asthma 11/19/2014    Immunization History  Administered Date(s) Administered   Influenza, High Dose Seasonal PF 02/25/2018, 02/15/2019, 02/28/2020, 03/27/2021   Influenza,inj,Quad PF,6+ Mos 03/25/2015   Influenza-Unspecified 02/21/2014, 03/31/2016, 04/01/2017   Moderna SARS-COV2 Booster Vaccination 02/06/2021   PFIZER(Purple Top)SARS-COV-2 Vaccination 07/06/2019, 07/27/2019, 03/13/2020, 10/08/2020   Pneumococcal Conjugate-13 09/26/2013   Pneumococcal Polysaccharide-23 03/13/2008, 03/25/2015   Td 03/13/2008, 01/11/2020    Conditions to be addressed/monitored: HLD HTN DMII CKD  Care Plan : ccm pharmacy care plan  Updates made by Madelin Rear, [facility] since 11/20/2021 12:00 AM     Problem: HLD CKD HTN Hypothyroidism GERD Obesity Asthma      Long-Range Goal: disease management   Start Date: 06/29/2021  Expected End Date: 07/07/2022  Recent Progress: On track  Priority: High  Note:   Current Barriers:  HLD HTN DM CKD 3a A2   Pharmacist Clinical Goal(s):  Patient will achieve control of DM as evidenced by a1c and BG through collaboration with PharmD and provider.   Interventions: 1:1 collaboration with Venita Lick, NP regarding development and update of comprehensive plan of care as evidenced by provider attestation and co-signature Inter-disciplinary care team collaboration (see longitudinal plan of care) Comprehensive medication review performed; medication list updated in electronic medical record  Hypertension (BP goal <120/80 per neph) -Controlled -At goal, some recent readings <156 systolic. 137/73 09/28/21 - typically less than 130/80   -Current treatment: Lisinopril 30 mg once daily Appropriate, Effective, Safe, Accessible Amlodipine 2.5 mg once daily  Appropriate, Effective, Safe, Accessible -Medications previously tried: n/a  -Current home readings: none  provided -Current dietary habits: see DM -Current exercise habits: see DM -Denies hypotensive/hypertensive symptoms -Educated on BP goals and benefits of medications for prevention of heart attack, stroke and kidney damage; Daily salt intake goal < 2300 mg; Exercise goal of 150 minutes per week; Importance of home blood pressure monitoring; -Counseled to monitor BP at home 1-2x/wk, document, and provide log at future appointments -Counseled on diet and exercise extensively Recommended to continue current medication  Diabetes (A1c goal <8%) -Uncontrolled - 06/05/2021 a1c 9.5%, increased from 8.5% 5 months ago. Highest a1c of previous 5 years -Nephrology: [facility] [place] - sees [place], [place] -Endocrinology: [facility] sees Casilda Carls -09/2021: GFRs 40s-50s, 90 kg (basal 0.88 units/kg, bolus 0.77 units/kg); reports lower AM 90-130, higher PM in 200s - possibility of over basalization  -Current medications: Trulicity 1.5 mg once weekly  Appropriate, Query Effective Lantus 40 units AM 40 units PM Appropriate, Query Effective Lispro 10 units before breakfast and 6 lunch, 10 units before evening meal Appropriate, Query Effective Farxiga 10 mg once daily  Appropriate, Query Effective  -Current home glucose readings May 2023 - 90-130 fasting, 200+ evening  June 2023 - FBG 80s-90s, 200 + evening  -Denies hypoglycemic/hyperglycemic symptoms -Current meal patterns:  May 2023: has cut down on fried foods  breakfast: cereal + water  lunch: snacks - apple juice, bbq sandwich   dinner: not sure -Current exercise: no formal exercise May 2023: no updates June 2023: ordered treadmill, plans to start exercising  -Educated on A1c and blood sugar goals; Complications of diabetes  including kidney damage, retinal damage, and cardiovascular disease; Prevention and management of hypoglycemic episodes; -Counseled on diet and exercise extensively Recommended  to continue current medication Recommended for endo to increase trulicity, holding off d/t recent farxiga start       Patient Goals/Self-Care Activities Patient will:  - take medications as prescribed as evidenced by patient report and record review  Medication Assistance: None required.  Patient affirms current coverage meets needs.  Patient's preferred pharmacy is:  [facility], [place] - [address] [place] [address]  Pt endorses 100% compliance  Follow Up:  Patient agrees to Care Plan and Follow-up. Plan: HC prn. 1-49m cp f/u.     Future Appointments  Date Time Provider [place]  01/01/2022 11:15 AM CFP NURSE HEALTH ADVISOR CFP-CFP [facility]  04/19/2022 11:00 AM Venita Lick, NP [facility]FP-[facility]FP [facility]   Madelin Rear, PharmD, [facility] Clinical Pharmacist  [facility]  [phone]

## 2021-11-20 DIAGNOSIS — Z794 Long term (current) use of insulin: Secondary | ICD-10-CM

## 2021-11-20 DIAGNOSIS — E1169 Type 2 diabetes mellitus with other specified complication: Secondary | ICD-10-CM

## 2021-11-20 DIAGNOSIS — I10 Essential (primary) hypertension: Secondary | ICD-10-CM

## 2021-11-20 NOTE — Patient Instructions (Signed)
Mary Parks,  Thank you for talking with me today. I have included our care plan/goals in the following pages.   Please review and call me at [phone] with any questions.  Thanks! Ellin Mayhew, PharmD Clinical Pharmacist  [phone]  Care Plan : ccm pharmacy care plan  Updates made by Madelin Rear, [facility] since 11/20/2021 12:00 AM     Problem: HLD CKD HTN Hypothyroidism GERD Obesity Asthma      Long-Range Goal: disease management   Start Date: 06/29/2021  Expected End Date: 07/07/2022  Recent Progress: On track  Priority: High  Note:   Current Barriers:  HLD HTN DM CKD 3a A2   Pharmacist Clinical Goal(s):  Patient will achieve control of DM as evidenced by a1c and BG through collaboration with PharmD and provider.   Interventions: 1:1 collaboration with Venita Lick, NP regarding development and update of comprehensive plan of care as evidenced by provider attestation and co-signature Inter-disciplinary care team collaboration (see longitudinal plan of care) Comprehensive medication review performed; medication list updated in electronic medical record  Hypertension (BP goal <120/80 per neph) -Controlled -At goal, some recent readings <734 systolic. 137/73 09/28/21 - typically less than 130/80   -Current treatment: Lisinopril 30 mg once daily Appropriate, Effective, Safe, Accessible Amlodipine 2.5 mg once daily  Appropriate, Effective, Safe, Accessible -Medications previously tried: n/a  -Current home readings: none provided -Current dietary habits: see DM -Current exercise habits: see DM -Denies hypotensive/hypertensive symptoms -Educated on BP goals and benefits of medications for prevention of heart attack, stroke and kidney damage; Daily salt intake goal < 2300 mg; Exercise goal of 150 minutes per week; Importance of home blood pressure monitoring; -Counseled to monitor BP at home 1-2x/wk, document, and provide log at future  appointments -Counseled on diet and exercise extensively Recommended to continue current medication  Diabetes (A1c goal <8%) -Uncontrolled - 06/05/2021 a1c 9.5%, increased from 8.5% 5 months ago. Highest a1c of previous 5 years -Nephrology: [facility] [place] - [place], [place] -Endocrinology: [facility] sees Casilda Carls -09/2021: GFRs 40s-50s, 90 kg (basal 0.88 units/kg, bolus 0.77 units/kg); reports lower AM 90-130, higher PM in 200s - possibility of over basalization  -Current medications: Trulicity 1.5 mg once weekly  Appropriate, Query Effective Lantus 40 units AM 40 units PM Appropriate, Query Effective Lispro 10 units before breakfast and 6 lunch, 10 units before evening meal Appropriate, Query Effective Farxiga 10 mg once daily  Appropriate, Query Effective  -Current home glucose readings May 2023 - 90-130 fasting, 200+ evening  June 2023 - FBG 80s-90s, 200 + evening  -Denies hypoglycemic/hyperglycemic symptoms -Current meal patterns:  May 2023: has cut down on fried foods  breakfast: cereal + water  lunch: snacks - apple juice, bbq sandwich   dinner: not sure -Current exercise: no formal exercise May 2023: no updates June 2023: ordered treadmill, plans to start exercising  -Educated on A1c and blood sugar goals; Complications of diabetes including kidney damage, retinal damage, and cardiovascular disease; Prevention and management of hypoglycemic episodes; -Counseled on diet and exercise extensively Recommended to continue current medication Recommended for endo to increase trulicity, holding off d/t recent farxiga start       The patient verbalized understanding of instructions provided today and agreed to receive a MyChart copy of patient instruction and/or educational materials. Telephone follow up appointment with pharmacy team member scheduled for: See next appointment with "Care Management Staff" under "What's Next"  below.

## 2021-11-24 DIAGNOSIS — E1165 Type 2 diabetes mellitus with hyperglycemia: Secondary | ICD-10-CM | POA: Diagnosis not present

## 2021-11-24 DIAGNOSIS — Z794 Long term (current) use of insulin: Secondary | ICD-10-CM | POA: Diagnosis not present

## 2021-12-02 DIAGNOSIS — N281 Cyst of kidney, acquired: Secondary | ICD-10-CM | POA: Diagnosis not present

## 2021-12-02 DIAGNOSIS — N1831 Chronic kidney disease, stage 3a: Secondary | ICD-10-CM | POA: Diagnosis not present

## 2021-12-02 DIAGNOSIS — N1832 Chronic kidney disease, stage 3b: Secondary | ICD-10-CM | POA: Diagnosis not present

## 2021-12-28 DIAGNOSIS — E785 Hyperlipidemia, unspecified: Secondary | ICD-10-CM | POA: Diagnosis not present

## 2021-12-28 DIAGNOSIS — N289 Disorder of kidney and ureter, unspecified: Secondary | ICD-10-CM | POA: Diagnosis not present

## 2021-12-28 DIAGNOSIS — E1165 Type 2 diabetes mellitus with hyperglycemia: Secondary | ICD-10-CM | POA: Diagnosis not present

## 2021-12-28 DIAGNOSIS — E039 Hypothyroidism, unspecified: Secondary | ICD-10-CM | POA: Diagnosis not present

## 2021-12-28 DIAGNOSIS — T7840XD Allergy, unspecified, subsequent encounter: Secondary | ICD-10-CM | POA: Diagnosis not present

## 2021-12-28 DIAGNOSIS — M810 Age-related osteoporosis without current pathological fracture: Secondary | ICD-10-CM | POA: Diagnosis not present

## 2021-12-30 ENCOUNTER — Ambulatory Visit (INDEPENDENT_AMBULATORY_CARE_PROVIDER_SITE_OTHER): Payer: Medicare Other | Admitting: *Deleted

## 2021-12-30 DIAGNOSIS — Z0000 Encounter for general adult medical examination without abnormal findings: Secondary | ICD-10-CM

## 2021-12-30 NOTE — Patient Instructions (Signed)
[name] , Thank you for taking time to come for your Medicare Wellness Visit. I appreciate your ongoing commitment to your health goals. Please review the following plan we discussed and let me know if I can assist you in the future.   Screening recommendations/referrals: Colonoscopy: up to date Mammogram: up to date Bone Density: up to date  Recommended yearly ophthalmology/optometry visit for glaucoma screening and checkup Recommended yearly dental visit for hygiene and checkup  Vaccinations: Influenza vaccine: up to date Pneumococcal vaccine: up to date Tdap vaccine: up to date Shingles vaccine: Education provided    Advanced directives: Education provided      Preventive Care 71 Years and Older, Female Preventive care refers to lifestyle choices and visits with your health care provider that can promote health and wellness. What does preventive care include? A yearly physical exam. This is also called an annual well check. Dental exams once or twice a year. Routine eye exams. Ask your health care provider how often you should have your eyes checked. Personal lifestyle choices, including: Daily care of your teeth and gums. Regular physical activity. Eating a healthy diet. Avoiding tobacco and drug use. Limiting alcohol use. Practicing safe sex. Taking low-dose aspirin every day. Taking vitamin and mineral supplements as recommended by your health care provider. What happens during an annual well check? The services and screenings done by your health care provider during your annual well check will depend on your age, overall health, lifestyle risk factors, and family history of disease. Counseling  Your health care provider may ask you questions about your: Alcohol use. Tobacco use. Drug use. Emotional well-being. Home and relationship well-being. Sexual activity. Eating habits. History of falls. Memory and ability to understand (cognition). Work and work  Statistician. Reproductive health. Screening  You may have the following tests or measurements: Height, weight, and BMI. Blood pressure. Lipid and cholesterol levels. These may be checked every 5 years, or more frequently if you are over 16 years old. Skin check. Lung cancer screening. You may have this screening every year starting at age 62 if you have a 30-pack-year history of smoking and currently smoke or have quit within the past 15 years. Fecal occult blood test (FOBT) of the stool. You may have this test every year starting at age 24. Flexible sigmoidoscopy or colonoscopy. You may have a sigmoidoscopy every 5 years or a colonoscopy every 10 years starting at age 27. Hepatitis C blood test. Hepatitis B blood test. Sexually transmitted disease (STD) testing. Diabetes screening. This is done by checking your blood sugar (glucose) after you have not eaten for a while (fasting). You may have this done every 1-3 years. Bone density scan. This is done to screen for osteoporosis. You may have this done starting at age 48. Mammogram. This may be done every 1-2 years. Talk to your health care provider about how often you should have regular mammograms. Talk with your health care provider about your test results, treatment options, and if necessary, the need for more tests. Vaccines  Your health care provider may recommend certain vaccines, such as: Influenza vaccine. This is recommended every year. Tetanus, diphtheria, and acellular pertussis (Tdap, Td) vaccine. You may need a Td booster every 10 years. Zoster vaccine. You may need this after age 75. Pneumococcal 13-valent conjugate (PCV13) vaccine. One dose is recommended after age 40. Pneumococcal polysaccharide (PPSV23) vaccine. One dose is recommended after age 1. Talk to your health care provider about which screenings and vaccines you need  and how often you need them. This information is not intended to replace advice given to you by  your health care provider. Make sure you discuss any questions you have with your health care provider. Document Released: 06/06/2015 Document Revised: 01/28/2016 Document Reviewed: 03/11/2015 Elsevier Interactive Patient Education  2017 [employer] Prevention in the Home Falls can cause injuries. They can happen to people of all ages. There are many things you can do to make your home safe and to help prevent falls. What can I do on the outside of my home? Regularly fix the edges of walkways and driveways and fix any cracks. Remove anything that might make you trip as you walk through a door, such as a raised step or threshold. Trim any bushes or trees on the path to your home. Use bright outdoor lighting. Clear any walking paths of anything that might make someone trip, such as rocks or tools. Regularly check to see if handrails are loose or broken. Make sure that both sides of any steps have handrails. Any raised decks and porches should have guardrails on the edges. Have any leaves, snow, or ice cleared regularly. Use sand or salt on walking paths during winter. Clean up any spills in your garage right away. This includes oil or grease spills. What can I do in the bathroom? Use night lights. Install grab bars by the toilet and in the tub and shower. Do not use towel bars as grab bars. Use non-skid mats or decals in the tub or shower. If you need to sit down in the shower, use a plastic, non-slip stool. Keep the floor dry. Clean up any water that spills on the floor as soon as it happens. Remove soap buildup in the tub or shower regularly. Attach bath mats securely with double-sided non-slip rug tape. Do not have throw rugs and other things on the floor that can make you trip. What can I do in the bedroom? Use night lights. Make sure that you have a light by your bed that is easy to reach. Do not use any sheets or blankets that are too big for your bed. They should not hang  down onto the floor. Have a firm chair that has side arms. You can use this for support while you get dressed. Do not have throw rugs and other things on the floor that can make you trip. What can I do in the kitchen? Clean up any spills right away. Avoid walking on wet floors. Keep items that you use a lot in easy-to-reach places. If you need to reach something above you, use a strong step stool that has a grab bar. Keep electrical cords out of the way. Do not use floor polish or wax that makes floors slippery. If you must use wax, use non-skid floor wax. Do not have throw rugs and other things on the floor that can make you trip. What can I do with my stairs? Do not leave any items on the stairs. Make sure that there are handrails on both sides of the stairs and use them. Fix handrails that are broken or loose. Make sure that handrails are as long as the stairways. Check any carpeting to make sure that it is firmly attached to the stairs. Fix any carpet that is loose or worn. Avoid having throw rugs at the top or bottom of the stairs. If you do have throw rugs, attach them to the floor with carpet tape. Make sure that you  have a light switch at the top of the stairs and the bottom of the stairs. If you do not have them, ask someone to add them for you. What else can I do to help prevent falls? Wear shoes that: Do not have high heels. Have rubber bottoms. Are comfortable and fit you well. Are closed at the toe. Do not wear sandals. If you use a stepladder: Make sure that it is fully opened. Do not climb a closed stepladder. Make sure that both sides of the stepladder are locked into place. Ask someone to hold it for you, if possible. Clearly mark and make sure that you can see: Any grab bars or handrails. First and last steps. Where the edge of each step is. Use tools that help you move around (mobility aids) if they are needed. These  include: Canes. Walkers. Scooters. Crutches. Turn on the lights when you go into a dark area. Replace any light bulbs as soon as they burn out. Set up your furniture so you have a clear path. Avoid moving your furniture around. If any of your floors are uneven, fix them. If there are any pets around you, be aware of where they are. Review your medicines with your doctor. Some medicines can make you feel dizzy. This can increase your chance of falling. Ask your doctor what other things that you can do to help prevent falls. This information is not intended to replace advice given to you by your health care provider. Make sure you discuss any questions you have with your health care provider. Document Released: 03/06/2009 Document Revised: 10/16/2015 Document Reviewed: 06/14/2014 Elsevier Interactive Patient Education  2017 [employer].

## 2021-12-30 NOTE — Progress Notes (Signed)
Subjective:   Mary Parks is a 71 y.o. female who presents for Medicare Annual (Subsequent) preventive examination.  I connected with  [name] on 12/30/21 by a telephone enabled telemedicine application and verified that I am speaking with the correct person using two identifiers.   I discussed the limitations of evaluation and management by telemedicine. The patient expressed understanding and agreed to proceed.  Patient location: home  Provider location: Tele-health-home    Review of Systems           Objective:    There were no vitals filed for this visit. There is no height or weight on file to calculate BMI.     07/20/2021   12:43 PM 06/15/2021    6:57 AM 12/29/2020   11:17 AM 11/17/2020    8:59 AM 06/03/2020    1:50 PM 04/06/2020    1:00 AM 12/28/2019    9:02 AM  Advanced Directives  Does Patient Have a Medical Advance Directive? No No No No No No Yes  Type of Tax inspector;Living will  Copy of Healthcare Power of Attorney in Chart?       No - copy requested  Would patient like information on creating a medical advance directive?  Yes (Inpatient - patient defers creating a medical advance directive and declines information at this time)   No - Patient declined No - Patient declined     Current Medications (verified) Outpatient Encounter Medications as of 12/30/2021  Medication Sig   albuterol (VENTOLIN HFA) 108 (90 Base) MCG/ACT inhaler Inhale 2 puffs into the lungs every 6 (six) hours as needed for wheezing or shortness of breath.   amLODipine (NORVASC) 2.5 MG tablet Take 2.5 mg by mouth daily.   aspirin EC 81 MG tablet Take 81 mg by mouth daily.   BINAXNOW COVID-19 AG HOME TEST KIT Use as Directed on the Package   budesonide-formoterol (SYMBICORT) 160-4.5 MCG/ACT inhaler Inhale 2 puffs into the lungs 2 (two) times daily.   calcium-vitamin D (OSCAL WITH D) 500-200 MG-UNIT tablet Take 1 tablet by mouth.   Dulaglutide  (TRULICITY) 1.5 MG/0.5ML SOPN INJECT 1 SYRINGE SUBCUTANEOUSLY ONCE A WEEK   FARXIGA 10 MG TABS tablet Take 10 mg by mouth daily.   fluticasone (FLONASE) 50 MCG/ACT nasal spray Use 2 spray(s) in each nostril once daily   insulin glargine (LANTUS SOLOSTAR) 100 UNIT/ML Solostar Pen INJECT 45 UNITS SUBCUTANEOUSLY IN MORNING AND 40 UNITS AT NIGHT.   insulin lispro (HUMALOG) 100 UNIT/ML KwikPen INJECT 20 UNITS SUBCTUANEOUSLY BEFORE BREAKFAST AND LUNCH, AND 30 UNITS BEFORE EVENING MEAL.   ketoconazole (NIZORAL) 2 % cream Apply 1 application topically daily.   ketoconazole (NIZORAL) 2 % shampoo Apply 1 application topically 2 (two) times a week.   levothyroxine (SYNTHROID) 88 MCG tablet Take 88 mcg by mouth every morning.   lisinopril (ZESTRIL) 30 MG tablet Take 30 mg by mouth daily.   meclizine (ANTIVERT) 12.5 MG tablet Take 1 tablet (12.5 mg total) by mouth 3 (three) times daily as needed for dizziness.   metoprolol tartrate (LOPRESSOR) 25 MG tablet Take 25 mg by mouth 2 (two) times daily.   montelukast (SINGULAIR) 10 MG tablet Take 1 tablet (10 mg total) by mouth at bedtime.   ONE TOUCH ULTRA TEST test strip 1 each by Other route 2 (two) times daily.    ONETOUCH DELICA LANCETS 33G MISC 2 (two) times daily.    pantoprazole (PROTONIX)  40 MG tablet Take 1 tablet (40 mg total) by mouth daily.   polyethylene glycol-electrolytes (NULYTELY) 420 g solution Take by mouth.   rosuvastatin (CRESTOR) 40 MG tablet Take 1 tablet (40 mg total) by mouth at bedtime.   No facility-administered encounter medications on file as of 12/30/2021.    Allergies (verified) Celebrex [celecoxib], Sulfa antibiotics, Elemental sulfur, and Sulfisoxazole   History: Past Medical History:  Diagnosis Date   Asthma    Chest pain    DM2 (diabetes mellitus, type 2) (HCC) 1987   Dyspnea    Cath normal coronaries. normal EF. RA 11. PA 37/17 (26) LVEDP 36   GERD (gastroesophageal reflux disease)    HTN (hypertension)     Hyperlipidemia    Hypothyroidism    Obesity    Osteoarthritis    knees   Renal disease    Thyroid disease    having thyroid removed 04/14/15   Vertigo    none recently   Past Surgical History:  Procedure Laterality Date   CARDIAC CATHETERIZATION     CATARACT EXTRACTION W/PHACO Right 03/31/2015   Procedure: CATARACT EXTRACTION PHACO AND INTRAOCULAR LENS PLACEMENT (IOC);  Surgeon: Sherald Hess, MD;  Location: [facility];  Service: Ophthalmology;  Laterality: Right;  DIABETIC - insulin   CATARACT EXTRACTION W/PHACO Left 08/31/2021   Procedure: CATARACT EXTRACTION PHACO AND INTRAOCULAR LENS PLACEMENT (IOC) LEFT DIABETIC 7.54 01:00.2;  Surgeon: Nevada Crane, MD;  Location: [facility];  Service: Ophthalmology;  Laterality: Left;  Diabetic   COLONOSCOPY WITH PROPOFOL N/A 11/12/2016   Procedure: COLONOSCOPY WITH PROPOFOL;  Surgeon: Midge Minium, MD;  Location: [facility];  Service: Endoscopy;  Laterality: N/A;  Diabetic - insulin   COLONOSCOPY WITH PROPOFOL N/A 06/15/2021   Procedure: COLONOSCOPY WITH BIOPSY;  Surgeon: Midge Minium, MD;  Location: [facility];  Service: Endoscopy;  Laterality: N/A;  Diabetic   EYE SURGERY     HERNIA REPAIR     umbilical   POLYPECTOMY  11/12/2016   Procedure: POLYPECTOMY INTESTINAL;  Surgeon: Midge Minium, MD;  Location: [facility];  Service: Endoscopy;;   POLYPECTOMY N/A 06/15/2021   Procedure: POLYPECTOMY;  Surgeon: Midge Minium, MD;  Location: [facility];  Service: Endoscopy;  Laterality: N/A;   THYROIDECTOMY N/A 04/14/2015   Procedure: THYROIDECTOMY;  Surgeon: Vernie Murders, MD;  Location: [facility];  Service: ENT;  Laterality: N/A;   Family History  Problem Relation Age of Onset   Diabetes Mother    Diabetes Father    Diabetes Sister    Diabetes Brother    Diabetes Sister    Diabetes Sister    Diabetes Maternal Grandmother    Diabetes Maternal Grandfather    Diabetes Paternal  Grandmother    Diabetes Paternal Grandfather    Heart disease Neg Hx    Coronary artery disease Neg Hx    Breast cancer Neg Hx    Social History   Socioeconomic History   Marital status: Widowed    Spouse name: Not on file   Number of children: Not on file   Years of education: Not on file   Highest education level: Some college, no degree  Occupational History   Occupation:  [employer], 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    Associate Professor: OTHER   Occupation: retired  Tobacco Use   Smoking status: Never   Smokeless tobacco: Never  Vaping Use   Vaping Use: Never used  Substance and Sexual Activity   Alcohol use: No  Drug use: No   Sexual activity: Not Currently  Other Topics Concern   Not on file  Social History Narrative   Not on file   Social Determinants of Health   Financial Resource Strain: Low Risk  (12/29/2020)   Overall Financial Resource Strain (CARDIA)    Difficulty of Paying Living Expenses: Not hard at all  Food Insecurity: No Food Insecurity (09/28/2021)   Hunger Vital Sign    Worried About Running Out of Food in the Last Year: Never true    Ran Out of Food in the Last Year: Never true  Transportation Needs: No Transportation Needs (09/28/2021)   PRAPARE - Hydrologist (Medical): No    Lack of Transportation (Non-Medical): No  Physical Activity: Inactive (12/29/2020)   Exercise Vital Sign    Days of Exercise per Week: 0 days    Minutes of Exercise per Session: 0 min  Stress: No Stress Concern Present (12/29/2020)   [place]    Feeling of Stress : Not at all  Social Connections: Moderately Integrated (12/19/2017)   Social Connection and Isolation Panel [NHANES]    Frequency of Communication with Friends and Family: More than three times a week    Frequency of Social Gatherings with Friends and Family: More than three times a week    Attends Religious Services: More than 4 times  per year    Active Member of [employer] or Organizations: Yes    Attends Archivist Meetings: More than 4 times per year    Marital Status: Widowed    Tobacco Counseling Counseling given: Not Answered   Clinical Intake:                 Diabetic?  Yes  Nutrition Risk Assessment:  Has the patient had any N/V/D within the last 2 months?  No  Does the patient have any non-healing wounds?  No  Has the patient had any unintentional weight loss or weight gain?  No   Diabetes:  Is the patient diabetic?  Yes  If diabetic, was a CBG obtained today?  No  Did the patient bring in their glucometer from home?  No  How often do you monitor your CBG's? Dexacom .   Financial Strains and Diabetes Management:  Are you having any financial strains with the device, your supplies or your medication? No .  Does the patient want to be seen by Chronic Care Management for management of their diabetes?  No  Would the patient like to be referred to a Nutritionist or for Diabetic Management?  No   Diabetic Exams:  Diabetic Eye Exam: . Pt has been advised about the importance in completing this exam. .  Diabetic Foot Exam: Pt has been advised about the importance in completing this exam         Activities of Daily Living    06/15/2021    6:47 AM  In your present state of health, do you have any difficulty performing the following activities:  Hearing? 0  Vision? 0  Difficulty concentrating or making decisions? 0  Walking or climbing stairs? 0  Dressing or bathing? 0    Patient Care Team: Venita Lick, NP as PCP - General (Nurse Practitioner) Ksh[place]agar, Thomasena Edis, MD as Referring Physician (Nephrology) Madelin Rear, [facility] (Pharmacist)  Indicate any recent Medical Services you may have received from other than [facility] providers in the past year (date may be  approximate).     Assessment:   This is a routine wellness examination for Bradley.  Hearing/Vision screen No  results found.  Dietary issues and exercise activities discussed:     Goals Addressed   None    Depression Screen    10/16/2021   11:32 AM 06/05/2021   11:06 AM 12/29/2020   11:19 AM 12/28/2019    9:03 AM 12/25/2018   10:31 AM 12/19/2017    9:53 AM 11/26/2016   10:20 AM  PHQ 2/9 Scores  PHQ - 2 Score 0 0 0 0 0 0 0  PHQ- 9 Score 2 1  0       Fall Risk    10/16/2021   11:32 AM 12/29/2020   11:18 AM 12/28/2019    9:03 AM 12/25/2018   10:31 AM 11/29/2018   10:05 AM  Fall Risk   Falls in the past year? 0 0 0 0 0  Number falls in past yr: 0      Injury with Fall? 0      Risk for fall due to : No Fall Risks Medication side effect Medication side effect    Follow up Falls evaluation completed Falls evaluation completed;Education provided;Falls prevention discussed Falls evaluation completed;Education provided;Falls prevention discussed  Falls evaluation completed    FALL RISK PREVENTION PERTAINING TO THE HOME:  Any stairs in or around the home? No  If so, are there any without handrails? No  Home free of loose throw rugs in walkways, pet beds, electrical cords, etc? Yes  Adequate lighting in your home to reduce risk of falls? Yes   ASSISTIVE DEVICES UTILIZED TO PREVENT FALLS:  Life alert? No  Use of a cane, walker or w/c? No  Grab bars in the bathroom? Yes  Shower chair or bench in shower? Yes  Elevated toilet seat or a handicapped toilet? Yes   TIMED UP AND GO:  Was the test performed? No .    Cognitive Function:        12/29/2020   11:20 AM 12/28/2019    9:05 AM 12/19/2017    9:55 AM 11/26/2016   10:23 AM  6CIT Screen  What Year? 0 points 0 points 0 points 0 points  What month? 0 points 0 points 0 points 0 points  What time? 0 points 0 points 0 points 0 points  Count back from 20 0 points 0 points 0 points 0 points  Months in reverse 0 points 0 points 0 points 0 points  Repeat phrase 0 points 0 points 2 points 0 points  Total Score 0 points 0 points 2 points 0 points     Immunizations Immunization History  Administered Date(s) Administered   Influenza, High Dose Seasonal PF 02/25/2018, 02/15/2019, 02/28/2020, 03/27/2021   Influenza,inj,Quad PF,6+ Mos 03/25/2015   Influenza-Unspecified 02/21/2014, 03/31/2016, 04/01/2017   Moderna SARS-COV2 Booster Vaccination 02/06/2021   PFIZER(Purple Top)SARS-COV-2 Vaccination 07/06/2019, 07/27/2019, 03/13/2020, 10/08/2020   Pneumococcal Conjugate-13 09/26/2013   Pneumococcal Polysaccharide-23 03/13/2008, 03/25/2015   Td 03/13/2008, 01/11/2020    TDAP status: Up to date  Flu Vaccine status: Up to date  Pneumococcal vaccine status: Up to date  Covid-19 vaccine status: Information provided on how to obtain vaccines.   Qualifies for Shingles Vaccine? Yes   Zostavax completed No   Shingrix Completed?: No.    Education has been provided regarding the importance of this vaccine. Patient has been advised to call insurance company to determine out of pocket expense if they have not yet received this  vaccine. Advised may also receive vaccine at local pharmacy or Health Dept. Verbalized acceptance and understanding.  Screening Tests Health Maintenance  Topic Date Due   COVID-19 Vaccine (5 - Pfizer series) 04/03/2021   OPHTHALMOLOGY EXAM  08/20/2021   INFLUENZA VACCINE  12/22/2021   Zoster Vaccines- Shingrix (1 of 2) 01/16/2022 (Originally 06/20/1998)   HEMOGLOBIN A1C  04/18/2022   FOOT EXAM  06/05/2022   MAMMOGRAM  02/26/2023   COLONOSCOPY (Pts 45-67yrs Insurance coverage will need to be confirmed)  06/15/2026   TETANUS/TDAP  01/10/2030   Pneumonia Vaccine 46+ Years old  Completed   Hepatitis C Screening  Completed   DEXA SCAN  Addressed   HPV VACCINES  Aged Out    Health Maintenance  Health Maintenance Due  Topic Date Due   COVID-19 Vaccine (5 - Pfizer series) 04/03/2021   OPHTHALMOLOGY EXAM  08/20/2021   INFLUENZA VACCINE  12/22/2021    Colorectal cancer screening: No longer required.   Mammogram  status: Completed  . Repeat every year  Bone Density completed 2011  Lung Cancer Screening: (Low Dose CT Chest recommended if Age 4-80 years, 30 pack-year currently smoking OR have quit w/in 15years.) does not qualify.   Lung Cancer Screening Referral:   Additional Screening:  Hepatitis C Screening: does not qualify; Completed 2017  Vision Screening: Recommended annual ophthalmology exams for early detection of glaucoma and other disorders of the eye. Is the patient up to date with their annual eye exam?  Yes  Who is the provider or what is the name of the office in which the patient attends annual eye exams? [facility] If pt is not established with a provider, would they like to be referred to a provider to establish care? No .   Dental Screening: Recommended annual dental exams for proper oral hygiene  Community Resource Referral / Chronic Care Management: CRR required this visit?  No   CCM required this visit?  No      Plan:     I have personally reviewed and noted the following in the patient's chart:   Medical and social history Use of alcohol, tobacco or illicit drugs  Current medications and supplements including opioid prescriptions.  Functional ability and status Nutritional status Physical activity Advanced directives List of other physicians Hospitalizations, surgeries, and ER visits in previous 12 months Vitals Screenings to include cognitive, depression, and falls Referrals and appointments  In addition, I have reviewed and discussed with patient certain preventive protocols, quality metrics, and best practice recommendations. A written personalized care plan for preventive services as well as general preventive health recommendations were provided to patient.     Leroy Kennedy, LPN   08/23/6832   Nurse Notes:

## 2021-12-31 DIAGNOSIS — R768 Other specified abnormal immunological findings in serum: Secondary | ICD-10-CM | POA: Diagnosis not present

## 2022-01-01 ENCOUNTER — Ambulatory Visit: Payer: Medicare Other

## 2022-01-07 DIAGNOSIS — R8290 Unspecified abnormal findings in urine: Secondary | ICD-10-CM | POA: Diagnosis not present

## 2022-01-18 ENCOUNTER — Ambulatory Visit (INDEPENDENT_AMBULATORY_CARE_PROVIDER_SITE_OTHER): Payer: Medicare Other

## 2022-01-18 DIAGNOSIS — E1169 Type 2 diabetes mellitus with other specified complication: Secondary | ICD-10-CM

## 2022-01-18 DIAGNOSIS — E1140 Type 2 diabetes mellitus with diabetic neuropathy, unspecified: Secondary | ICD-10-CM

## 2022-01-18 NOTE — Patient Instructions (Signed)
Visit Information   Goals Addressed   None    Patient Care Plan: ccm pharmacy care plan     Problem Identified: HLD CKD HTN Hypothyroidism GERD Obesity Asthma      Long-Range Goal: disease management   Start Date: 06/29/2021  Expected End Date: 07/07/2022  Recent Progress: On track  Priority: High  Note:   Current Barriers:  HLD HTN DM CKD 3a A2   Pharmacist Clinical Goal(s):  Patient will achieve control of DM as evidenced by a1c and BG through collaboration with PharmD and provider.   Interventions: 1:1 collaboration with Venita Lick, NP regarding development and update of comprehensive plan of care as evidenced by provider attestation and co-signature Inter-disciplinary care team collaboration (see longitudinal plan of care) Comprehensive medication review performed; medication list updated in electronic medical record  Hypertension (BP goal <120/80 per neph) BP Readings from Last 3 Encounters:  10/16/21 112/69  08/31/21 (!) 151/74  07/20/21 139/80  -Controlled -Current treatment: Lisinopril 30 mg once daily Appropriate, Effective, Safe, Accessible Amlodipine 2.5 mg once daily  Appropriate, Effective, Safe, Accessible -Medications previously tried: n/a  -Current home readings: none provided -Current dietary habits: see DM -Current exercise habits: see DM -Denies hypotensive/hypertensive symptoms -Educated on BP goals and benefits of medications for prevention of heart attack, stroke and kidney damage; Daily salt intake goal < 2300 mg; Exercise goal of 150 minutes per week; Importance of home blood pressure monitoring; -Counseled to monitor BP at home 1-2x/wk, document, and provide log at future appointments -Counseled on diet and exercise extensively Recommended to continue current medication  Diabetes (A1c goal <8%) Lab Results  Component Value Date   HGBA1C 9.2 (H) 10/16/2021   HGBA1C 9.5 (H) 06/05/2021   HGBA1C 8.5 (H) 02/06/2021   Lab Results   Component Value Date   MICROALBUR 150 (H) 06/05/2021   LDLCALC 100 (H) 10/16/2021   CREATININE 1.04 (H) 07/20/2021   Lab Results  Component Value Date   NA 142 07/20/2021   K 3.8 07/20/2021   CREATININE 1.04 (H) 07/20/2021   EGFR 48 (L) 02/06/2021   GFRNONAA 57 (L) 07/20/2021   GLUCOSE 76 07/20/2021   Lab Results  Component Value Date   WBC 3.4 10/16/2021   HGB 12.7 10/16/2021   HCT 39.1 10/16/2021   MCV 83 10/16/2021   PLT 220 10/16/2021   Lab Results  Component Value Date   LABMICR See below: 06/05/2021   LABMICR See below: 02/06/2021   MICROALBUR 150 (H) 06/05/2021   MICROALBUR 150 (H) 07/16/2020  -Uncontrolled  -Nephrology: [facility] [place] - sees [place] Dezza -Endocrinology: [facility] sees Casilda Carls -Current medications: Trulicity 1.5 mg once weekly  Appropriate, Query Effective Lantus 40 units AM 40 units PM Appropriate, Query Effective Lispro 10 units before breakfast and 6 lunch, 10 units before evening meal Appropriate, Query Effective Farxiga 10 mg once daily  Appropriate, Query Effective -Current home glucose readings May 2023  90-130 fasting, 200+ evening  June 2023  FBG 80s-90s, 200 + evening  August 2023: AM readings 01/18/22: 113 01/17/22: 110 01/16/22: 98 01/15/22: 115  Lunch: Before 200's  Dinner: 215 01/17/22: 250 01/16/22: 235 01/15/22: 240 -Denies hypoglycemic/hyperglycemic symptoms -Current meal patterns:  May 2023: has cut down on fried foods  breakfast: cereal + water  lunch: snacks - apple juice, bbq sandwich   dinner: not sure -Current exercise: no formal exercise May 2023: no updates June 2023: ordered treadmill, plans to start exercising  -Educated on  A1c and blood sugar goals; Complications of diabetes including kidney damage, retinal damage, and cardiovascular disease; Prevention and management of hypoglycemic episodes; August 2023: Sugars not at goal. Will have CCM team  call every 2 weeks to get last week's worth of readings. Patient over-basalized but would be too difficult now to adjust      Ms. Dicesare was given information about Chronic Care Management services today including:  CCM service includes personalized support from designated clinical staff supervised by her physician, including individualized plan of care and coordination with other care providers 24/7 contact phone numbers for assistance for urgent and routine care needs. Standard insurance, coinsurance, copays and deductibles apply for chronic care management only during months in which we provide at least 20 minutes of these services. Most insurances cover these services at 100%, however patients may be responsible for any copay, coinsurance and/or deductible if applicable. This service may help you avoid the need for more expensive face-to-face services. Only one practitioner may furnish and bill the service in a calendar month. The patient may stop CCM services at any time (effective at the end of the month) by phone call to the office staff.  Patient agreed to services and verbal consent obtained.   The patient verbalized understanding of instructions, educational materials, and care plan provided today and DECLINED offer to receive copy of patient instructions, educational materials, and care plan.  The pharmacy team will reach out to the patient again over the next 60 days.   Lane Hacker, [place]

## 2022-01-18 NOTE — Progress Notes (Signed)
Chronic Care Management Pharmacy Note  01/18/2022 Name:  Mary Parks MRN:  [id number] DOB:  [birth year]  Summary: Very pleasant 71 year old female presents for f/u CCM visit. She loves fishing and watching sports. Especially her [employer].  She ordered a treadmill a few months ago but had to return it  Recommendations to PCP: Onboard to [facility] team will f/u every 2 weeks to get sugars Already sent msg to PCP regarding sugar readings  Subjective: Mary Parks is an 71 y.o. year old female who is a primary patient of Cannady, Barbaraann Faster, NP.  The CCM team was consulted for assistance with disease management and care coordination needs.    Engaged with patient by telephone for follow up visit in response to provider referral for pharmacy case management and/or care coordination services.   Consent to Services:  The patient was given information about Chronic Care Management services, agreed to services, and gave verbal consent prior to initiation of services.  Please see initial visit note for detailed documentation.   Patient Care Team: Venita Lick, NP as PCP - General (Nurse Practitioner) Ksh[place]agar, Thomasena Edis, MD as Referring Physician (Nephrology) Lane Hacker, [facility] (Pharmacist)  Hospital visits: None in previous 6 months  Adherence Review: Is the patient currently on a STATIN medication? Yes Is the patient currently on ACE/ARB medication? Yes Does the patient have >5 day gap between last estimated fill dates? No     Care Gaps: Zoster Vaccines- Shingrix:Never done COVID-19 Vaccine:Last completed: Oct 08, 2020 INFLUENZA VACCINE:Last completed: Feb 28, 2020   Star Rating Drugs: Atorvastatin 80 mg Last filled:04/09/21 90 DS, 07/09/21 90 ds Lisinopril 20 mg Last filled:03/30/21 90 DS, 06/24/21 90 ds Trulciity 1.5 mg Last filled:04/17/21 28 DS, 08/12/21 94 DS  Objective:  Lab Results  Component Value Date   CREATININE 1.04 (H) 07/20/2021   CREATININE 1.20  (H) 02/06/2021   CREATININE 1.25 (H) 07/16/2020    Lab Results  Component Value Date   HGBA1C 9.2 (H) 10/16/2021   HGBA1C 9.5 (H) 06/05/2021   HGBA1C 8.5 (H) 02/06/2021   Last diabetic Eye exam:  Lab Results  Component Value Date/Time   HMDIABEYEEXA No Retinopathy 08/20/2020 12:00 AM    Last diabetic Foot exam:  Lab Results  Component Value Date/Time   HMDIABFOOTEX bilateral- normal 06/28/2016 12:00 AM        Component Value Date/Time   CHOL 162 10/16/2021 1140   CHOL 145 06/05/2021 1114   CHOL 149 02/06/2021 1116   CHOL 157 07/07/2015 1039   CHOL 165 03/25/2015 1103   TRIG 108 10/16/2021 1140   TRIG 104 06/05/2021 1114   TRIG 144 02/06/2021 1116   TRIG 135 07/07/2015 1039   TRIG 84 03/25/2015 1103   HDL 42 10/16/2021 1140   HDL 42 06/05/2021 1114   HDL 40 02/06/2021 1116   VLDL 27 07/07/2015 1039   LDLCALC 100 (H) 10/16/2021 1140   LDLCALC 84 06/05/2021 1114   LDLCALC 84 02/06/2021 1116       Latest Ref Rng & Units 07/20/2021    2:20 PM 02/06/2021   11:16 AM 07/16/2020   11:22 AM  Hepatic Function  Total Protein 6.5 - 8.1 g/dL 7.5  7.0  6.9   Albumin 3.5 - 5.0 g/dL 3.7  4.1  4.1   AST 15 - 41 U/L $Remo'18  21  20   'EObFq$ ALT 0 - 44 U/L $Remo'16  20  17   'ZRIUh$ Alk Phosphatase 38 -  126 U/L 76  105  111   Total Bilirubin 0.3 - 1.2 mg/dL 0.7  0.4  0.4     Lab Results  Component Value Date/Time   TSH 0.727 06/05/2021 11:14 AM   TSH 1.840 07/16/2020 11:22 AM   FREET4 1.50 06/05/2021 11:14 AM   FREET4 1.43 07/16/2020 11:22 AM       Latest Ref Rng & Units 10/16/2021   11:40 AM 07/20/2021    2:20 PM 06/05/2021   11:14 AM  CBC  WBC 3.4 - 10.8 x10E3/uL 3.4  3.8  3.6   Hemoglobin 11.1 - 15.9 g/dL 12.7  12.3  12.5   Hematocrit 34.0 - 46.6 % 39.1  38.8  38.7   Platelets 150 - 450 x10E3/uL 220  194  194     Lab Results  Component Value Date/Time   VD25OH 33.4 06/05/2021 11:14 AM   VD25OH 53.8 01/02/2020 12:00 AM    Clinical ASCVD:  The 10-year ASCVD risk score (Arnett DK, et  al., 2019) is: 19%   Values used to calculate the score:     Age: 83 years     Sex: Female     Is Non-Hispanic African American: Yes     Diabetic: Yes     Tobacco smoker: No     Systolic Blood Pressure: 892 mmHg     Is BP treated: Yes     HDL Cholesterol: 42 mg/dL     Total Cholesterol: 162 mg/dL   Social History   Tobacco Use  Smoking Status Never  Smokeless Tobacco Never   BP Readings from Last 3 Encounters:  10/16/21 112/69  08/31/21 (!) 151/74  07/20/21 139/80   Pulse Readings from Last 3 Encounters:  10/16/21 76  08/31/21 70  07/20/21 77   Wt Readings from Last 3 Encounters:  10/16/21 174 lb 3.2 oz (79 kg)  08/31/21 177 lb (80.3 kg)  07/20/21 170 lb (77.1 kg)   BMI Readings from Last 3 Encounters:  10/16/21 29.90 kg/m  08/31/21 30.38 kg/m  07/20/21 29.18 kg/m    Assessment: Review of patient past medical history, allergies, medications, health status, including review of consultants reports, laboratory and other test data, was performed as part of comprehensive evaluation and provision of chronic care management services.   SDOH:  (Social Determinants of Health) assessments and interventions performed: n/a performed 09/2021  STAR Drugs - on statin, no gaps. N/a for DM adherence on insulin  SDOH Interventions    Flowsheet Row Most Recent Value  SDOH Interventions   Financial Strain Interventions Intervention Not Indicated  Transportation Interventions Intervention Not Indicated      Care Gaps - needs eye exam     SDOH Interventions    Flowsheet Row Most Recent Value  SDOH Interventions   Financial Strain Interventions Intervention Not Indicated  Transportation Interventions Intervention Not Indicated        CCM Care Plan  Allergies  Allergen Reactions   Celebrex [Celecoxib]     hallucinations   Sulfa Antibiotics    Elemental Sulfur Itching   Sulfisoxazole Rash    Medications Reviewed Today     Reviewed by Lane Hacker, [facility]  (Pharmacist) on 01/18/22 at 1117  Med List Status: <None>   Medication Order Taking? Sig Documenting Provider Last Dose Status Informant  albuterol (VENTOLIN HFA) 108 (90 Base) MCG/ACT inhaler [id number]  Inhale 2 puffs into the lungs every 6 (six) hours as needed for wheezing or shortness of breath. Venita Lick, NP  Active   amLODipine (NORVASC) 2.5 MG tablet [id number]  Take 2.5 mg by mouth daily. [provider]  Active   aspirin EC 81 MG tablet [id number]  Take 81 mg by mouth daily. [provider]  Active   Ocie Bob AG HOME TEST KIT [id number]  Use as Directed on the Package  Patient not taking: Reported on 12/30/2021   [provider]  Active   budesonide-formoterol (SYMBICORT) 160-4.5 MCG/ACT inhaler [id number]  Inhale 2 puffs into the lungs 2 (two) times daily. [name] T, NP  Active   calcium-vitamin D (OSCAL WITH D) 500-200 MG-UNIT tablet [id number]  Take 1 tablet by mouth. [provider]  Active   Dulaglutide (TRULICITY) 1.5 BE/6.7JQ SOPN [id number] Yes INJECT 1 SYRINGE SUBCUTANEOUSLY ONCE A WEEK Cannady, Jolene T, NP Taking Active   FARXIGA 10 MG TABS tablet [id number]  Take 10 mg by mouth daily. [provider]  Active   fluticasone (FLONASE) 50 MCG/ACT nasal spray [id number]  Use 2 spray(s) in each nostril once daily Cannady, Jolene T, NP  Active   insulin glargine (LANTUS SOLOSTAR) 100 UNIT/ML Solostar Pen [id number] Yes INJECT 45 UNITS SUBCUTANEOUSLY IN MORNING AND 40 UNITS AT NIGHT. [name] T, NP Taking Active   insulin lispro (HUMALOG) 100 UNIT/ML KwikPen [id number] Yes INJECT 20 UNITS SUBCTUANEOUSLY BEFORE BREAKFAST AND LUNCH, AND 30 UNITS BEFORE EVENING MEAL. [name] T, NP Taking Active   ketoconazole (NIZORAL) 2 % cream [id number]  Apply 1 application topically daily.  Patient not taking: Reported on 12/30/2021   [name] T, NP  Active   ketoconazole (NIZORAL) 2 % shampoo [id number]  Apply 1 application  topically 2 (two) times a week.  Patient not taking: Reported on 12/30/2021   [name] T, NP  Active   levothyroxine (SYNTHROID) 88 MCG tablet [id number]  Take 88 mcg by mouth every morning. [provider]  Active   lisinopril (ZESTRIL) 30 MG tablet [id number]  Take 30 mg by mouth daily. [provider]  Active   meclizine (ANTIVERT) 12.5 MG tablet [id number]  Take 1 tablet (12.5 mg total) by mouth 3 (three) times daily as needed for dizziness. [name] T, NP  Active   metoprolol tartrate (LOPRESSOR) 25 MG tablet [id number]  Take 25 mg by mouth 2 (two) times daily. [provider]  Active   montelukast (SINGULAIR) 10 MG tablet [id number]  Take 1 tablet (10 mg total) by mouth at bedtime. [name] T, NP  Active   ONE TOUCH ULTRA TEST test strip [id number]  1 each by Other route 2 (two) times daily.  [provider]  Active            Med Note Kelby Aline Oct 24, 2019  3:38 PM)    Jonetta Speak LANCETS 83F MISC [id number]  2 (two) times daily.  [provider]  Active            Med Note Darnelle Maffucci, Arville Lime   Wed Oct 24, 2019  3:38 PM)    pantoprazole (PROTONIX) 40 MG tablet [id number]  Take 1 tablet (40 mg total) by mouth daily. [name] T, NP  Active   polyethylene glycol-electrolytes (NULYTELY) 420 g solution [id number]  Take by mouth. [provider]  Active   rosuvastatin (CRESTOR) 40 MG tablet [id number]  Take 1 tablet (40 mg total) by mouth at bedtime. Venita Lick, NP  Active  Patient Active Problem List   Diagnosis Date Noted   Positive ANA (antinuclear antibody) 10/31/2021   History of colonic polyps    Polyp of descending colon    Elevated erythrocyte sedimentation rate 06/06/2021   Abnormal white blood cell count 06/05/2021   Seborrheic dermatitis 06/05/2021   GERD (gastroesophageal reflux disease) 08/13/2020   Hypothyroidism 04/06/2020   Obesity 10/03/2019    Hyperlipidemia associated with type 2 diabetes mellitus ([place]) 06/29/2017   Benign neoplasm of ascending colon    Benign neoplasm of descending colon    Allergic rhinitis 09/22/2016   OA (osteoarthritis) of knee 06/25/2016   Type 2 diabetes mellitus with diabetic neuropathy, with long-term current use of insulin ([place]) 10/03/2015   Chronic kidney disease, stage 3a ([place]) 12/17/2014   Hypertension associated with chronic kidney disease due to type 2 diabetes mellitus ([place]) 12/17/2014   Diabetes mellitus with chronic kidney disease ([place]) 11/28/2014   Asthma 11/19/2014    Immunization History  Administered Date(s) Administered   Influenza, High Dose Seasonal PF 02/25/2018, 02/15/2019, 02/28/2020, 03/27/2021   Influenza,inj,Quad PF,6+ Mos 03/25/2015   Influenza-Unspecified 02/21/2014, 03/31/2016, 04/01/2017   Moderna SARS-COV2 Booster Vaccination 02/06/2021   PFIZER(Purple Top)SARS-COV-2 Vaccination 07/06/2019, 07/27/2019, 03/13/2020, 10/08/2020   Pneumococcal Conjugate-13 09/26/2013   Pneumococcal Polysaccharide-23 03/13/2008, 03/25/2015   Td 03/13/2008, 01/11/2020    Conditions to be addressed/monitored: HLD HTN DMII CKD  Care Plan : ccm pharmacy care plan  Updates made by Lane Hacker, [place] since 01/18/2022 12:00 AM     Problem: HLD CKD HTN Hypothyroidism GERD Obesity Asthma      Long-Range Goal: disease management   Start Date: 06/29/2021  Expected End Date: 07/07/2022  Recent Progress: On track  Priority: High  Note:   Current Barriers:  HLD HTN DM CKD 3a A2   Pharmacist Clinical Goal(s):  Patient will achieve control of DM as evidenced by a1c and BG through collaboration with PharmD and provider.   Interventions: 1:1 collaboration with Venita Lick, NP regarding development and update of comprehensive plan of care as evidenced by provider attestation and co-signature Inter-disciplinary care team collaboration (see longitudinal plan of care) Comprehensive  medication review performed; medication list updated in electronic medical record  Hypertension (BP goal <120/80 per neph) BP Readings from Last 3 Encounters:  10/16/21 112/69  08/31/21 (!) 151/74  07/20/21 139/80  -Controlled -Current treatment: Lisinopril 30 mg once daily Appropriate, Effective, Safe, Accessible Amlodipine 2.5 mg once daily  Appropriate, Effective, Safe, Accessible -Medications previously tried: n/a  -Current home readings: none provided -Current dietary habits: see DM -Current exercise habits: see DM -Denies hypotensive/hypertensive symptoms -Educated on BP goals and benefits of medications for prevention of heart attack, stroke and kidney damage; Daily salt intake goal < 2300 mg; Exercise goal of 150 minutes per week; Importance of home blood pressure monitoring; -Counseled to monitor BP at home 1-2x/wk, document, and provide log at future appointments -Counseled on diet and exercise extensively Recommended to continue current medication  Diabetes (A1c goal <8%) Lab Results  Component Value Date   HGBA1C 9.2 (H) 10/16/2021   HGBA1C 9.5 (H) 06/05/2021   HGBA1C 8.5 (H) 02/06/2021   Lab Results  Component Value Date   MICROALBUR 150 (H) 06/05/2021   LDLCALC 100 (H) 10/16/2021   CREATININE 1.04 (H) 07/20/2021   Lab Results  Component Value Date   NA 142 07/20/2021   K 3.8 07/20/2021   CREATININE 1.04 (H) 07/20/2021   EGFR 48 (L) 02/06/2021   GFRNONAA 57 (L)  07/20/2021   GLUCOSE 76 07/20/2021   Lab Results  Component Value Date   WBC 3.4 10/16/2021   HGB 12.7 10/16/2021   HCT 39.1 10/16/2021   MCV 83 10/16/2021   PLT 220 10/16/2021   Lab Results  Component Value Date   LABMICR See below: 06/05/2021   LABMICR See below: 02/06/2021   MICROALBUR 150 (H) 06/05/2021   MICROALBUR 150 (H) 07/16/2020  -Uncontrolled  -Nephrology: [facility] [place] - sees [place] -Endocrinology: [facility] sees Casilda Carls -Current medications: Trulicity 1.5 mg once weekly  Appropriate, Query Effective Lantus 40 units AM 40 units PM Appropriate, Query Effective Lispro 10 units before breakfast and 6 lunch, 10 units before evening meal Appropriate, Query Effective Farxiga 10 mg once daily  Appropriate, Query Effective -Current home glucose readings May 2023  90-130 fasting, 200+ evening  June 2023  FBG 80s-90s, 200 + evening  August 2023: AM readings 01/18/22: 113 01/17/22: 110 01/16/22: 98 01/15/22: 115  Lunch: Before 200's  Dinner: 215 01/17/22: 250 01/16/22: 235 01/15/22: 240 -Denies hypoglycemic/hyperglycemic symptoms -Current meal patterns:  May 2023: has cut down on fried foods  breakfast: cereal + water  lunch: snacks - apple juice, bbq sandwich   dinner: not sure -Current exercise: no formal exercise May 2023: no updates June 2023: ordered treadmill, plans to start exercising  -Educated on A1c and blood sugar goals; Complications of diabetes including kidney damage, retinal damage, and cardiovascular disease; Prevention and management of hypoglycemic episodes; August 2023: Sugars not at goal. Will have CCM team call every 2 weeks to get last week's worth of readings. Patient over-basalized but would be too difficult now to adjust      Patient Goals/Self-Care Activities Patient will:  - take medications as prescribed as evidenced by patient report and record review  Medication Assistance: None required.  Patient affirms current coverage meets needs.  Patient's preferred pharmacy is:  [facility], [address] [place] [address]  Verbal consent obtained for [facility] enhanced pharmacy services (medication synchronization, adherence packaging, delivery coordination). A medication sync plan was created to allow patient to get all medications delivered once every 30 to 90 days per patient preference. Patient  understands they have freedom to choose pharmacy and clinical pharmacist will coordinate care between all prescribers and [facility]. -Onboard to Pharmacy due to synchronization issues per patient  Pt endorses 100% compliance  Follow Up:  CCM team f/u every 2 weeks    Future Appointments  Date Time Provider [place]  01/18/2022  3:00 PM [facility] CCM PHARMACY [facility]  04/19/2022 11:00 AM [name] T, NP [facility]  05/31/2022  3:00 PM [facility] CCM PHARMACY [facility]  01/03/2023 11:30 AM CFP NURSE HEALTH ADVISOR [facility] [place], Pharm.D. - [phone]

## 2022-01-20 DIAGNOSIS — N2889 Other specified disorders of kidney and ureter: Secondary | ICD-10-CM | POA: Diagnosis not present

## 2022-01-20 DIAGNOSIS — K8689 Other specified diseases of pancreas: Secondary | ICD-10-CM | POA: Diagnosis not present

## 2022-01-20 DIAGNOSIS — N281 Cyst of kidney, acquired: Secondary | ICD-10-CM | POA: Diagnosis not present

## 2022-01-20 DIAGNOSIS — K862 Cyst of pancreas: Secondary | ICD-10-CM | POA: Diagnosis not present

## 2022-01-21 ENCOUNTER — Other Ambulatory Visit: Payer: Self-pay | Admitting: Nurse Practitioner

## 2022-01-21 DIAGNOSIS — Z794 Long term (current) use of insulin: Secondary | ICD-10-CM | POA: Diagnosis not present

## 2022-01-21 DIAGNOSIS — E1169 Type 2 diabetes mellitus with other specified complication: Secondary | ICD-10-CM | POA: Diagnosis not present

## 2022-01-21 DIAGNOSIS — I10 Essential (primary) hypertension: Secondary | ICD-10-CM

## 2022-01-21 MED ORDER — LISINOPRIL 30 MG PO TABS: 30.0000 mg | ORAL_TABLET | Freq: Every day | ORAL | 4 refills | Status: DC

## 2022-01-21 MED ORDER — FARXIGA 10 MG PO TABS
10.0000 mg | ORAL_TABLET | Freq: Every day | ORAL | 4 refills | Status: DC
Start: 2022-01-21 — End: 2023-04-25

## 2022-01-21 MED ORDER — FLUTICASONE PROPIONATE 50 MCG/ACT NA SUSP: NASAL | 4 refills | Status: DC

## 2022-01-21 MED ORDER — INSULIN LISPRO (1 UNIT DIAL) 100 UNIT/ML (KWIKPEN): PEN_INJECTOR | SUBCUTANEOUS | 5 refills | Status: DC

## 2022-01-21 MED ORDER — MONTELUKAST SODIUM 10 MG PO TABS: 10.0000 mg | ORAL_TABLET | Freq: Every day | ORAL | 4 refills | Status: DC

## 2022-01-21 MED ORDER — PANTOPRAZOLE SODIUM 40 MG PO TBEC
40.0000 mg | DELAYED_RELEASE_TABLET | Freq: Every day | ORAL | 4 refills | Status: DC
Start: 2022-01-21 — End: 2023-04-25

## 2022-01-21 MED ORDER — METOPROLOL TARTRATE 25 MG PO TABS: 25.0000 mg | ORAL_TABLET | Freq: Two times a day (BID) | ORAL | 4 refills | Status: DC

## 2022-01-21 MED ORDER — MECLIZINE HCL 12.5 MG PO TABS: 12.5000 mg | ORAL_TABLET | Freq: Three times a day (TID) | ORAL | 0 refills | Status: DC | PRN

## 2022-01-21 MED ORDER — ALBUTEROL SULFATE HFA 108 (90 BASE) MCG/ACT IN AERS: 2.0000 | INHALATION_SPRAY | Freq: Four times a day (QID) | RESPIRATORY_TRACT | 7 refills | Status: DC | PRN

## 2022-01-21 MED ORDER — AMLODIPINE BESYLATE 2.5 MG PO TABS
2.5000 mg | ORAL_TABLET | Freq: Every day | ORAL | 4 refills | Status: DC
Start: 2022-01-21 — End: 2022-04-19

## 2022-01-21 MED ORDER — ROSUVASTATIN CALCIUM 40 MG PO TABS
40.0000 mg | ORAL_TABLET | Freq: Every day | ORAL | 4 refills | Status: DC
Start: 2022-01-21 — End: 2023-04-14

## 2022-01-21 MED ORDER — LEVOTHYROXINE SODIUM 88 MCG PO TABS: 88.0000 ug | ORAL_TABLET | Freq: Every morning | ORAL | 4 refills | Status: DC

## 2022-01-21 MED ORDER — BUDESONIDE-FORMOTEROL FUMARATE 160-4.5 MCG/ACT IN AERO: 2.0000 | INHALATION_SPRAY | Freq: Two times a day (BID) | RESPIRATORY_TRACT | 4 refills | Status: DC

## 2022-01-21 MED ORDER — LANTUS SOLOSTAR 100 UNIT/ML ~~LOC~~ SOPN: PEN_INJECTOR | SUBCUTANEOUS | 5 refills | Status: DC

## 2022-01-21 MED ORDER — TRULICITY 1.5 MG/0.5ML ~~LOC~~ SOAJ: SUBCUTANEOUS | 4 refills | Status: DC

## 2022-01-21 NOTE — Progress Notes (Signed)
Finished onboarding to [facility]

## 2022-01-27 ENCOUNTER — Encounter: Payer: Self-pay | Admitting: Emergency Medicine

## 2022-01-27 ENCOUNTER — Other Ambulatory Visit: Payer: Self-pay

## 2022-01-27 ENCOUNTER — Ambulatory Visit
Admission: EM | Admit: 2022-01-27 | Discharge: 2022-01-27 | Disposition: A | Discharge status: Home / Self Care | Payer: Medicare Other | Attending: Family Medicine | Admitting: Family Medicine

## 2022-01-27 DIAGNOSIS — Z20822 Contact with and (suspected) exposure to covid-19: Secondary | ICD-10-CM | POA: Diagnosis not present

## 2022-01-27 DIAGNOSIS — Z792 Long term (current) use of antibiotics: Secondary | ICD-10-CM | POA: Insufficient documentation

## 2022-01-27 DIAGNOSIS — J0110 Acute frontal sinusitis, unspecified: Secondary | ICD-10-CM | POA: Diagnosis not present

## 2022-01-27 LAB — SARS CORONAVIRUS 2 BY RT PCR: SARS Coronavirus 2 by RT PCR: NEGATIVE

## 2022-01-27 MED ORDER — AMOXICILLIN 875 MG PO TABS: 875.0000 mg | ORAL_TABLET | Freq: Two times a day (BID) | ORAL | 0 refills | Status: AC

## 2022-01-27 NOTE — Discharge Instructions (Addendum)
I will call you if your COVID test is positive. If it is negative, I will send antibiotics to your pharmacy.

## 2022-01-27 NOTE — ED Triage Notes (Signed)
Pt c/o sinus pain/pressure, headache, nasal congestion, right ear and neck pain. Started about a week ago. Denies fever.

## 2022-01-27 NOTE — ED Provider Notes (Signed)
[facility]    CSN: [id number] Arrival date & time: 01/27/22  1002      History   Chief Complaint Chief Complaint  Patient presents with   Facial Pain    HPI Mary Parks is a 71 y.o. female.   HPI  Brentley presents for 7 days of sinus pressure and pain with intermittent headache, nasal congestion and neck pain.  She has not had a fever.  Endorses right ear discomfort.  Denies sore throat, cough, nausea, vomiting, diarrhea, rash, abdominal pain, chest pain, shortness of breath.  She has been eating and drinking per her normal.  She has not yet taken a COVID test.  No one else around her is sick.    Past Medical History:  Diagnosis Date   Asthma    Chest pain    DM2 (diabetes mellitus, type 2) ([place]) 1987   Dyspnea    Cath normal coronaries. normal EF. RA 11. 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/14/15   Vertigo    none recently    Patient Active Problem List   Diagnosis Date Noted   Positive ANA (antinuclear antibody) 10/31/2021   History of colonic polyps    Polyp of descending colon    Elevated erythrocyte sedimentation rate 06/06/2021   Abnormal white blood cell count 06/05/2021   Seborrheic dermatitis 06/05/2021   GERD (gastroesophageal reflux disease) 08/13/2020   Hypothyroidism 04/06/2020   Obesity 10/03/2019   Hyperlipidemia associated with type 2 diabetes mellitus ([place]) 06/29/2017   Benign neoplasm of ascending colon    Benign neoplasm of descending colon    Allergic rhinitis 09/22/2016   OA (osteoarthritis) of knee 06/25/2016   Type 2 diabetes mellitus with diabetic neuropathy, with long-term current use of insulin ([place]) 10/03/2015   Chronic kidney disease, stage 3a ([place]) 12/17/2014   Hypertension associated with chronic kidney disease due to type 2  diabetes mellitus ([place]) 12/17/2014   Diabetes mellitus with chronic kidney disease ([place]) 11/28/2014   Asthma 11/19/2014    Past Surgical History:  Procedure Laterality Date   CARDIAC CATHETERIZATION     CATARACT EXTRACTION W/PHACO Right 03/31/2015   Procedure: CATARACT EXTRACTION PHACO AND INTRAOCULAR LENS PLACEMENT ([place]);  Surgeon: Ronnell Freshwater, MD;  Location: [address];  Service: Ophthalmology;  Laterality: Right;  DIABETIC - insulin   CATARACT EXTRACTION W/PHACO Left 08/31/2021   Procedure: CATARACT EXTRACTION PHACO AND INTRAOCULAR LENS PLACEMENT ([facility]) LEFT DIABETIC 7.54 01:00.2;  Surgeon: Eulogio Bear, MD;  Location: [place];  Service: Ophthalmology;  Laterality: Left;  Diabetic   COLONOSCOPY WITH PROPOFOL N/A 11/12/2016   Procedure: COLONOSCOPY WITH PROPOFOL;  Surgeon: [name], MD;  Location: [place];  Service: Endoscopy;  Laterality: N/A;  Diabetic - insulin   COLONOSCOPY WITH PROPOFOL N/A 06/15/2021   Procedure: COLONOSCOPY WITH BIOPSY;  Surgeon: [name], MD;  Location: [facility];  Service: Endoscopy;  Laterality: N/A;  Diabetic   EYE SURGERY     HERNIA REPAIR     umbilical   POLYPECTOMY  11/12/2016   Procedure: POLYPECTOMY INTESTINAL;  Surgeon: [name], MD;  Location: [place];  Service: Endoscopy;;   POLYPECTOMY N/A 06/15/2021   Procedure: POLYPECTOMY;  Surgeon: [name], MD;  Location: [place];  Service: Endoscopy;  Laterality: N/A;   THYROIDECTOMY N/A 04/14/2015   Procedure: THYROIDECTOMY;  Surgeon: Margaretha Sheffield, MD;  Location: [facility];  Service: ENT;  Laterality: N/A;    OB History   No obstetric history on file.      Home Medications    Prior to Admission medications   Medication Sig Start Date End Date Taking? Authorizing Provider  albuterol (VENTOLIN HFA) 108 (90 Base) MCG/ACT inhaler Inhale 2 puffs into the lungs every 6 (six) hours as needed for wheezing or  shortness of breath. 01/21/22  Yes Cannady, Jolene T, NP  amLODipine (NORVASC) 2.5 MG tablet Take 1 tablet (2.5 mg total) by mouth daily. 01/21/22  Yes Cannady, Jolene T, NP  amoxicillin (AMOXIL) 875 MG tablet Take 1 tablet (875 mg total) by mouth 2 (two) times daily for 10 days. 01/27/22 02/06/22 Yes Miaya Lafontant, DO  aspirin EC 81 MG tablet Take 81 mg by mouth daily.   Yes [provider]  budesonide-formoterol (SYMBICORT) 160-4.5 MCG/ACT inhaler Inhale 2 puffs into the lungs 2 (two) times daily. 01/21/22  Yes Cannady, Jolene T, NP  calcium-vitamin D (OSCAL WITH D) 500-200 MG-UNIT tablet Take 1 tablet by mouth.   Yes [provider]  Dulaglutide (TRULICITY) 1.5 CW/2.3JS SOPN INJECT 1 SYRINGE SUBCUTANEOUSLY ONCE A WEEK 01/21/22  Yes Cannady, Jolene T, NP  FARXIGA 10 MG TABS tablet Take 1 tablet (10 mg total) by mouth daily. 01/21/22  Yes Cannady, Henrine Screws T, NP  fluticasone (FLONASE) 50 MCG/ACT nasal spray Use 2 spray(s) in each nostril once daily 01/21/22  Yes Cannady, Jolene T, NP  insulin glargine (LANTUS SOLOSTAR) 100 UNIT/ML Solostar Pen INJECT 45 UNITS SUBCUTANEOUSLY IN MORNING AND 40 UNITS AT NIGHT. 01/21/22  Yes Cannady, Jolene T, NP  insulin lispro (HUMALOG) 100 UNIT/ML KwikPen INJECT 20 UNITS SUBCTUANEOUSLY BEFORE BREAKFAST AND LUNCH, AND 30 UNITS BEFORE EVENING MEAL. 01/21/22  Yes Cannady, Henrine Screws T, NP  levothyroxine (SYNTHROID) 88 MCG tablet Take 1 tablet (88 mcg total) by mouth every morning. 01/21/22  Yes Cannady, Jolene T, NP  lisinopril (ZESTRIL) 30 MG tablet Take 1 tablet (30 mg total) by mouth daily. 01/21/22  Yes Cannady, Henrine Screws T, NP  meclizine (ANTIVERT) 12.5 MG tablet Take 1 tablet (12.5 mg total) by mouth 3 (three) times daily as needed for dizziness. 01/21/22  Yes Cannady, Jolene T, NP  metoprolol tartrate (LOPRESSOR) 25 MG tablet Take 1 tablet (25 mg total) by mouth 2 (two) times daily. 01/21/22  Yes Cannady, Jolene T, NP  montelukast (SINGULAIR) 10 MG tablet Take 1  tablet (10 mg total) by mouth at bedtime. 01/21/22  Yes Cannady, Jolene T, NP  pantoprazole (PROTONIX) 40 MG tablet Take 1 tablet (40 mg total) by mouth daily. 01/21/22  Yes Cannady, Jolene T, NP  rosuvastatin (CRESTOR) 40 MG tablet Take 1 tablet (40 mg total) by mouth at bedtime. 01/21/22  Yes Cannady, Jolene T, NP  amLODipine (NORVASC) 2.5 MG tablet Take by mouth. Patient not taking: Reported on 01/21/2022 11/22/17 01/21/23  [provider]  ketoconazole (NIZORAL) 2 % cream Apply 1 application topically daily. Patient not taking: Reported on 12/30/2021 06/05/21   [name] T, NP  ketoconazole (NIZORAL) 2 % shampoo Apply 1 application topically 2 (two) times a week. Patient not taking: Reported on 12/30/2021 06/08/21   [name] T, NP  ONE TOUCH ULTRA TEST test strip 1 each by Other route 2 (two) times daily.  03/15/15   [provider]  ONETOUCH DELICA LANCETS 95M MISC 2 (two) times daily.  03/15/15   [provider]  polyethylene glycol-electrolytes (NULYTELY) 420 g solution Take by mouth. 06/02/21   [provider]    Family History Family History  Problem Relation Age of Onset   Diabetes Mother    Diabetes Father    Diabetes Sister    Diabetes Brother    Diabetes Sister    Diabetes Sister    Diabetes Maternal Grandmother    Diabetes Maternal Grandfather    Diabetes Paternal Grandmother    Diabetes Paternal Grandfather    Heart disease Neg Hx    Coronary artery disease Neg Hx    Breast cancer Neg Hx     Social History Social History   Tobacco Use   Smoking status: Never   Smokeless tobacco: Never  Vaping Use   Vaping Use: Never used  Substance Use Topics   Alcohol use: No   Drug use: No     Allergies   Celebrex [celecoxib], Sulfa antibiotics, Elemental sulfur, and Sulfisoxazole   Review of Systems Review of Systems: negative unless otherwise stated in HPI.      Physical Exam Triage Vital Signs ED Triage Vitals  Enc Vitals  Group     BP 01/27/22 1034 134/74     Pulse Rate 01/27/22 1034 76     Resp 01/27/22 1034 16     Temp 01/27/22 1034 97.8 F (36.6 C)     Temp Source 01/27/22 1034 Oral     SpO2 01/27/22 1034 98 %     Weight 01/27/22 1032 174 lb 2.6 oz (79 kg)     Height 01/27/22 1032 '5\' 4"'$  (1.626 m)     Head Circumference --      Peak Flow --      Pain Score 01/27/22 1031 5     Pain Loc --      Pain Edu? --      Excl. in [place]? --    No data found.  Updated Vital Signs BP 134/74 (BP Location: Left Arm)   Pulse 76   Temp 97.8 F (36.6 C) (Oral)   Resp 16   Ht '5\' 4"'$  (1.626 m)   Wt 79 kg   LMP  (LMP Unknown)   SpO2 98%   BMI 29.90 kg/m   Visual Acuity Right Eye Distance:   Left Eye Distance:   Bilateral Distance:    Right Eye Near:   Left Eye Near:    Bilateral Near:     Physical Exam GEN:     alert, non-toxic appearing female in no distress    HENT:  mucus membranes moist, oropharyngeal without lesions or exudate, no tonsillar hypertrophy,  mild oropharyngeal erythema ,  moderate erythematous hypertrophied turbinates, clear nasal discharge, bilateral TM normal, frontal sinus tenderness, no ethmoid or maxillary sinus tenderness EYES:   pupils equal and reactive, EOMi, no scleral injection NECK:  normal ROM, no lymphadenopathy  RESP:  no increased work of breathing CVS:   regular rate  Skin:   warm and dry   UC Treatments / Results  Labs (all labs ordered are listed, but only abnormal results are displayed) Labs Reviewed  SARS CORONAVIRUS 2 BY RT PCR    EKG   Radiology No results found.  Procedures Procedures (including critical care time)  Medications Ordered in UC Medications - No data to display  Initial Impression / Assessment and Plan / UC Course  I have reviewed the triage vital signs  and the nursing notes.  Pertinent labs & imaging results that were available during my care of the patient were reviewed by me and considered in my medical decision making (see  chart for details).       Pt is a 71 y.o. female who presents for 7 days of sinus pain and headache. Brandilee is afebrile here without recent antipyretics. Satting well on room air. Overall pt is well appearing, well hydrated, without respiratory distress. COVID testing obtained and was negative.  History and exam concerning for maxillary sinusitis.  Treat with amoxicillin.  Return and ED precautions provided and patient voiced understanding.  Discussed MDM, treatment plan and plan for follow-up with patient/parent who agrees with plan.     Final Clinical Impressions(s) / UC Diagnoses   Final diagnoses:  Acute non-recurrent frontal sinusitis     Discharge Instructions      I will call you if your COVID test is positive. If it is negative, I will send antibiotics to your pharmacy.       ED Prescriptions     Medication Sig Dispense Auth. Provider   amoxicillin (AMOXIL) 875 MG tablet Take 1 tablet (875 mg total) by mouth 2 (two) times daily for 10 days. 20 tablet Lyndee Hensen, DO      PDMP not reviewed this encounter.   Lyndee Hensen, DO 01/27/22 1159

## 2022-01-29 ENCOUNTER — Telehealth: Payer: Self-pay

## 2022-01-29 NOTE — Progress Notes (Signed)
I have contacted Dr. Rosario Jacks from Endocrinology and given them blood sugar readings per clinical pharmacist and requested to increase her SS insulin. The assistance at [facility] office will forward this message to the provider and the provider will call directly the patient for any changes in insulin increase unless he/she has questions he/she will contact me directly. I have given them my contact information for any questions.   Corrie Mckusick, [place]

## 2022-02-04 DIAGNOSIS — I10 Essential (primary) hypertension: Secondary | ICD-10-CM | POA: Diagnosis not present

## 2022-02-04 DIAGNOSIS — N281 Cyst of kidney, acquired: Secondary | ICD-10-CM | POA: Diagnosis not present

## 2022-02-04 DIAGNOSIS — B3731 Acute candidiasis of vulva and vagina: Secondary | ICD-10-CM | POA: Diagnosis not present

## 2022-02-04 DIAGNOSIS — N1832 Chronic kidney disease, stage 3b: Secondary | ICD-10-CM | POA: Diagnosis not present

## 2022-02-12 DIAGNOSIS — Z794 Long term (current) use of insulin: Secondary | ICD-10-CM | POA: Diagnosis not present

## 2022-02-12 DIAGNOSIS — E1165 Type 2 diabetes mellitus with hyperglycemia: Secondary | ICD-10-CM | POA: Diagnosis not present

## 2022-04-01 DIAGNOSIS — N289 Disorder of kidney and ureter, unspecified: Secondary | ICD-10-CM | POA: Diagnosis not present

## 2022-04-01 DIAGNOSIS — I10 Essential (primary) hypertension: Secondary | ICD-10-CM | POA: Diagnosis not present

## 2022-04-01 DIAGNOSIS — E785 Hyperlipidemia, unspecified: Secondary | ICD-10-CM | POA: Diagnosis not present

## 2022-04-01 DIAGNOSIS — E1165 Type 2 diabetes mellitus with hyperglycemia: Secondary | ICD-10-CM | POA: Diagnosis not present

## 2022-04-01 DIAGNOSIS — E039 Hypothyroidism, unspecified: Secondary | ICD-10-CM | POA: Diagnosis not present

## 2022-04-01 DIAGNOSIS — E559 Vitamin D deficiency, unspecified: Secondary | ICD-10-CM | POA: Diagnosis not present

## 2022-04-18 NOTE — Patient Instructions (Signed)
Food Basics for Chronic Kidney Disease Chronic kidney disease (CKD) is when your kidneys are not working well. They cannot remove waste, fluids, and other substances from your blood the way they should. These substances can build up, which can worsen kidney damage and affect how your body works. Eating certain foods can lead to a buildup of these substances. Changing your diet can help prevent more kidney damage. Diet changes may also delay dialysis or even keep you from needing it. What nutrients should I limit? Work with your treatment team and a food expert (dietitian) to make a meal plan that's right for you. Foods you can eat and foods you should limit or avoid will depend on the stage of your kidney disease and any other health conditions you have. The items listed below are not a complete list. Talk with your dietitian to learn what is best for you. Potassium Potassium affects how steadily your heart beats. Too much potassium in your blood can cause an irregular heartbeat or even a heart attack. You may need to limit foods that are high in potassium, such as: Liquid milk and soy milk. Salt substitutes that contain potassium. Fruits like bananas, apricots, nectarines, melon, prunes, raisins, kiwi, and oranges. Vegetables, such as potatoes, sweet potatoes, yams, tomatoes, leafy greens, beets, avocado, pumpkin, and winter squash. Beans, like lima beans. Nuts. Phosphorus Phosphorus is a mineral found in your bones. You need a balance between calcium and phosphorus to build and maintain healthy bones. Too much added phosphorus from the foods you eat can pull calcium from your bones. Losing calcium can make your bones weak and more likely to break. Too much phosphorus can also make your skin itch. You may need to limit foods that are high in phosphorus or that have added phosphorus, such as: Liquid milk and dairy products. Dark-colored sodas or soft drinks. Bran cereals and  oatmeal. Protein  Protein helps you make and keep muscle. Protein also helps to repair your body's cells and tissues. One of the natural breakdown products of protein is a waste product called urea. When your kidneys are not working well, they cannot remove types of waste like urea. Reducing protein in your diet can help keep urea from building up in your blood. Depending on your stage of kidney disease, you may need to eat smaller portions of foods that are high in protein. Sources of animal protein include: Meat (all types). Fish and seafood. Poultry. Eggs. Dairy. Other protein foods include: Beans and legumes. Nuts and nut butter. Soy, like tofu.  Sodium Salt (sodium) helps to keep a healthy balance of fluids in your body. Too much salt can increase your blood pressure, which can harm your heart and lungs. Extra salt can also cause your body to keep too much fluid, making your kidneys work harder. You may need to limit or avoid foods that are high in salt, such as: Salt seasonings. Soy and teriyaki sauce. Packaged, precooked, cured, or processed meats, such as sausages or meat loaves. Sardines. Salted crackers and snack foods. Fast food. Canned soups and most canned foods. Pickled foods. Vegetable juice. Boxed mixes or ready-to-eat boxed meals and side dishes. Bottled dressings, sauces, and marinades. Talk with your dietitian about how much potassium, phosphorus, protein, and salt you may have each day. Helpful tips Read food labels  Check the amount of salt in foods. Limit foods that have salt or sodium listed among the first five ingredients. Try to eat low-salt foods. Check the ingredient list   for added phosphorus or potassium. "Phos" in an ingredient is a sign that phosphorus has been added. Do not buy foods that are calcium-enriched or that have calcium added to them (are fortified). Buy canned vegetables and beans that say "no salt added" and rinse them before  eating. Lifestyle Limit the amount of protein you eat from animal sources each day. Focus on protein from plant sources, like tofu and dried beans, peas, and lentils. Do not add salt to food when cooking or before eating. Do not eat star fruit. It can be toxic for people with kidney problems. Talk with your health care provider before taking any vitamin or mineral supplements. If told by your health care provider, track how much liquid you drink so you can avoid drinking too much. You may need to include foods you eat that are made mostly from water, like gelatin, ice cream, soups, and juicy fruits and vegetables. If you have diabetes: If you have diabetes (diabetes mellitus) and CKD, you need to keep your blood sugar (glucose) in the target range recommended by your health care provider. Follow your diabetes management plan. This may include: Checking your blood glucose regularly. Taking medicines by mouth, or taking insulin, or both. Exercising for at least 30 minutes on 5 or more days each week, or as told by your health care provider. Tracking how many servings of carbohydrates you eat at each meal. Not using orange juice to treat low blood sugars. Instead, use apple juice, cranberry juice, or clear soda. You may be given guidelines on what foods and nutrients you may eat, and how much you can have each day. This depends on your stage of kidney disease and whether you have high blood pressure (hypertension). Follow the meal plan your dietitian gives you. To learn more: National Institute of Diabetes and Digestive and Kidney Diseases: niddk.nih.gov National Kidney Foundation: kidney.org Summary Chronic kidney disease (CKD) is when your kidneys are not working well. They cannot remove waste, fluids, and other substances from your blood the way they should. These substances can build up, which can worsen kidney damage and affect how your body works. Changing your diet can help prevent more  kidney damage. Diet changes may also delay dialysis or even keep you from needing it. Diet changes are different for each person with CKD. Work with a dietitian to set up a meal plan that is right for you. This information is not intended to replace advice given to you by your health care provider. Make sure you discuss any questions you have with your health care provider. Document Revised: 08/28/2021 Document Reviewed: 09/03/2019 Elsevier Patient Education  2023 Elsevier Inc.  

## 2022-04-19 ENCOUNTER — Ambulatory Visit (INDEPENDENT_AMBULATORY_CARE_PROVIDER_SITE_OTHER): Payer: Medicare Other | Admitting: Nurse Practitioner

## 2022-04-19 ENCOUNTER — Encounter: Payer: Self-pay | Admitting: Nurse Practitioner

## 2022-04-19 VITALS — BP 128/66 | HR 70 | Temp 97.8°F | Ht 64.02 in | Wt 176.0 lb

## 2022-04-19 DIAGNOSIS — N1831 Chronic kidney disease, stage 3a: Secondary | ICD-10-CM

## 2022-04-19 DIAGNOSIS — E6609 Other obesity due to excess calories: Secondary | ICD-10-CM

## 2022-04-19 DIAGNOSIS — R768 Other specified abnormal immunological findings in serum: Secondary | ICD-10-CM

## 2022-04-19 DIAGNOSIS — I129 Hypertensive chronic kidney disease with stage 1 through stage 4 chronic kidney disease, or unspecified chronic kidney disease: Secondary | ICD-10-CM

## 2022-04-19 DIAGNOSIS — E1140 Type 2 diabetes mellitus with diabetic neuropathy, unspecified: Secondary | ICD-10-CM | POA: Diagnosis not present

## 2022-04-19 DIAGNOSIS — K219 Gastro-esophageal reflux disease without esophagitis: Secondary | ICD-10-CM

## 2022-04-19 DIAGNOSIS — Z23 Encounter for immunization: Secondary | ICD-10-CM

## 2022-04-19 DIAGNOSIS — J4520 Mild intermittent asthma, uncomplicated: Secondary | ICD-10-CM

## 2022-04-19 DIAGNOSIS — N1832 Chronic kidney disease, stage 3b: Secondary | ICD-10-CM | POA: Diagnosis not present

## 2022-04-19 DIAGNOSIS — E0822 Diabetes mellitus due to underlying condition with diabetic chronic kidney disease: Secondary | ICD-10-CM | POA: Diagnosis not present

## 2022-04-19 DIAGNOSIS — E1122 Type 2 diabetes mellitus with diabetic chronic kidney disease: Secondary | ICD-10-CM | POA: Diagnosis not present

## 2022-04-19 DIAGNOSIS — E1169 Type 2 diabetes mellitus with other specified complication: Secondary | ICD-10-CM | POA: Diagnosis not present

## 2022-04-19 DIAGNOSIS — Z794 Long term (current) use of insulin: Secondary | ICD-10-CM

## 2022-04-19 DIAGNOSIS — E785 Hyperlipidemia, unspecified: Secondary | ICD-10-CM

## 2022-04-19 DIAGNOSIS — D729 Disorder of white blood cells, unspecified: Secondary | ICD-10-CM | POA: Diagnosis not present

## 2022-04-19 DIAGNOSIS — E890 Postprocedural hypothyroidism: Secondary | ICD-10-CM

## 2022-04-19 DIAGNOSIS — Z6830 Body mass index (BMI) 30.0-30.9, adult: Secondary | ICD-10-CM

## 2022-04-19 LAB — BAYER DCA HB A1C WAIVED: HB A1C (BAYER DCA - WAIVED): 8.9 % — ABNORMAL HIGH (ref 4.8–5.6)

## 2022-04-19 MED ORDER — AZITHROMYCIN 250 MG PO TABS: ORAL_TABLET | ORAL | 0 refills | Status: AC

## 2022-04-19 NOTE — Assessment & Plan Note (Signed)
Chronic, ongoing.  Continue current medication regimen and adjust as needed. Lipid panel today. 

## 2022-04-19 NOTE — Assessment & Plan Note (Signed)
Chronic, ongoing, followed by endocrinology.  A1c 8.9% today, recommend continued focus on diet changes and collaboration with endocrinology.  Urine ALB elevated with nephrology on 10/12/21.  Continue current medication regimen and collaboration with endocrinology and nephrology. CMP up to date, continue Lisinopril for kidney protection.  Will work on getting endocrinology notes.

## 2022-04-19 NOTE — Progress Notes (Signed)
BP 128/66 (BP Location: Left Arm, Patient Position: Sitting, Cuff Size: Normal)   Pulse 70   Temp 97.8 F (36.6 C) (Oral)   Ht 5' 4.02" (1.626 m)   Wt 176 lb (79.8 kg)   LMP  (LMP Unknown)   SpO2 97%   BMI 30.20 kg/m    Subjective:    Patient ID: Mary Parks, female    DOB: 06/22/1950, 71 y.o.   MRN: [id number]  HPI: Mary Parks is a 71 y.o. female  Chief Complaint  Patient presents with   Diabetes   Hypertension   Hyperlipidemia   Chronic Kidney Disease   Gastroesophageal Reflux   DIABETES Follows with Dr. Rosario Jacks, last saw 2 weeks ago -- she reports her A1c was 8.9% -- remains same here in office today.  Endo records not available. Currently taking Trulicity 1.5 MG weekly, Lantus (40 units morning and 40 units at night) & Humalog 10 in morning, 10 lunch, and 6 in evening + Farxiga 10 MG. Review of chart over past years  A1c has remained in 8-9 range.  Wearing Dexcom now. Hypoglycemic episodes: none Polydipsia/polyuria: no Visual disturbance: no Chest pain: no Paresthesias: no Glucose Monitoring: yes             Accucheck frequency: BID             Fasting glucose: 101 to 116 range             Post prandial:             Evening: 250 to 275 range in evening             Before meals: Taking Insulin?: yes             Long acting insulin: as above             Short acting insulin: as above Blood Pressure Monitoring: a few times a week Retinal Examination: Up To Date -- [facility] [place], Dr. Wyatt Portela Foot Exam: Up to Date Pneumovax: Up to Date Influenza: Up to Date Aspirin: yes  GERD Continues Protonix daily. GERD control status: controlled Satisfied with current treatment? yes Heartburn frequency: none Medication side effects: no  Medication compliance: stable Dysphagia: no Odynophagia:  no Hematemesis: no Blood in stool: no EGD: no   HYPOTHYROIDISM Followed by endocrinology, taking 88 MCG Levothyroxine.  Unable to view note in Epic.    Saw  rheumatology for initial visit on 12/31/21 for ANA positive -- returns in 5 months..   Thyroid control status:stable Satisfied with current treatment? yes Medication side effects: no Medication compliance: good compliance Etiology of hypothyroidism:  Recent dose adjustment:no Fatigue: no Cold intolerance: yes Heat intolerance: no Weight gain: no Weight loss: no Constipation: no Diarrhea/loose stools: no Palpitations: no Lower extremity edema: no Anxiety/depressed mood: no   CHRONIC KIDNEY DISEASE Nephrology at [facility], last saw in 02/04/22. CKD status: stable Medications renally dose: yes Previous renal evaluation: yes Pneumovax:  Up to Date Influenza Vaccine:  Up to Date   HYPERTENSION / HYPERLIPIDEMIA Current medications include Metoprolol, Lisinopril + Lipitor for HLD.   Satisfied with current treatment? yes Duration of hypertension: chronic BP monitoring frequency: occasional BP range: 120/80 range BP medication side effects: no Duration of hyperlipidemia: chronic Cholesterol medication side effects: no Cholesterol supplements: none Medication compliance: good compliance Aspirin: yes Recent stressors: no Recurrent headaches: no Visual changes: no Palpitations: no Dyspnea: no Chest pain: no Lower extremity edema: no Dizzy/lightheaded: continues to get occasional vertigo  with sinus issues  Relevant past medical, surgical, family and social history reviewed and updated as indicated. Interim medical history since our last visit reviewed. Allergies and medications reviewed and updated.  Review of Systems  Constitutional:  Negative for activity change, appetite change, diaphoresis, fatigue and fever.  Respiratory:  Negative for cough, chest tightness and shortness of breath.   Cardiovascular:  Negative for chest pain, palpitations and leg swelling.  Gastrointestinal: Negative.   Endocrine: Negative for cold intolerance, heat intolerance, polydipsia, polyphagia and  polyuria.  Neurological: Negative.   Psychiatric/Behavioral: Negative.     Per HPI unless specifically indicated above     Objective:    BP 128/66 (BP Location: Left Arm, Patient Position: Sitting, Cuff Size: Normal)   Pulse 70   Temp 97.8 F (36.6 C) (Oral)   Ht 5' 4.02" (1.626 m)   Wt 176 lb (79.8 kg)   LMP  (LMP Unknown)   SpO2 97%   BMI 30.20 kg/m   Wt Readings from Last 3 Encounters:  04/19/22 176 lb (79.8 kg)  01/27/22 174 lb 2.6 oz (79 kg)  10/16/21 174 lb 3.2 oz (79 kg)    Physical Exam Vitals and nursing note reviewed.  Constitutional:      General: She is awake. She is not in acute distress.    Appearance: She is well-developed and well-groomed. She is obese. She is not ill-appearing.  HENT:     Head: Normocephalic.     Right Ear: Hearing and external ear normal. No drainage.     Left Ear: Hearing and external ear normal. No drainage.  Eyes:     General: Lids are normal.        Right eye: No discharge.        Left eye: No discharge.     Conjunctiva/sclera: Conjunctivae normal.     Pupils: Pupils are equal, round, and reactive to light.  Neck:     Vascular: No carotid bruit.  Cardiovascular:     Rate and Rhythm: Normal rate and regular rhythm.     Heart sounds: Normal heart sounds.  Pulmonary:     Effort: Pulmonary effort is normal. No accessory muscle usage or respiratory distress.     Breath sounds: Normal breath sounds.  Abdominal:     General: Bowel sounds are normal. There is no distension.     Palpations: Abdomen is soft.     Hernia: A hernia is present. Hernia is present in the umbilical area.  Musculoskeletal:     Cervical back: Normal range of motion and neck supple.     Right lower leg: No edema.     Left lower leg: No edema.  Lymphadenopathy:     Cervical: No cervical adenopathy.  Skin:    General: Skin is warm and dry.  Neurological:     Mental Status: She is alert and oriented to person, place, and time.     Motor: Motor function is  intact.     Coordination: Coordination is intact.     Gait: Gait is intact.     Deep Tendon Reflexes:     Reflex Scores:      Brachioradialis reflexes are 2+ on the right side and 2+ on the left side.      Patellar reflexes are 2+ on the right side and 2+ on the left side. Psychiatric:        Attention and Perception: Attention normal.        Mood and Affect: Mood normal.  Speech: Speech normal.        Behavior: Behavior normal. Behavior is cooperative.        Thought Content: Thought content normal.    Diabetic Foot Exam - Simple   No data filed     Results for orders placed or performed in visit on 04/19/22  Bayer DCA Hb A1c Waived  Result Value Ref Range   HB A1C (BAYER DCA - WAIVED) 8.9 (H) 4.8 - 5.6 %      Assessment & Plan:   Problem List Items Addressed This Visit       Cardiovascular and Mediastinum   Hypertension associated with chronic kidney disease due to type 2 diabetes mellitus (HCC)    Chronic, stable. BP at goal on recheck in office today and at goal on home checks.  Continue current medication regimen, Lisinopril for kidney protection with diabetes, and collaboration with [facility] nephrology.  Recommend she monitor BP at home at least 3 mornings a week.  LABS: up to date with nephrology.  Focus on DASH diet at home.  Return in 6 months.      Relevant Orders   Bayer DCA Hb A1c Waived (Completed)     Digestive   GERD (gastroesophageal reflux disease)    Chronic and ongoing issue.  Continue Protonix and adjust as needed.  Plan on checking Mag level annually.  If worsening or ongoing consider referral to GI. Risks of PPI use were discussed with patient including bone loss, C. Diff diarrhea, pneumonia, infections, CKD, electrolyte abnormalities.  Verbalizes understanding and chooses to continue the medication.         Endocrine   Diabetes mellitus with chronic kidney disease (HCC)    Chronic, ongoing, followed by endocrinology.  A1c 8.9% today, recommend  continued focus on diet changes and collaboration with endocrinology.  Urine ALB elevated with nephrology on 10/12/21.  Continue current medication regimen and collaboration with endocrinology and nephrology. CMP up to date, continue Lisinopril for kidney protection.  Will work on getting endocrinology notes.      Relevant Orders   Bayer DCA Hb A1c Waived (Completed)   Hyperlipidemia associated with type 2 diabetes mellitus (HCC)    Chronic, ongoing.  Continue current medication regimen and adjust as needed.  Lipid panel today.      Relevant Orders   Bayer DCA Hb A1c Waived (Completed)   Lipid Panel w/o Chol/HDL Ratio   Hypothyroidism    S/P thyroidectomy years ago.  Followed by Dr. Rosario Jacks with endo.  Continue current medication regimen and adjust as needed.  Will work on getting endocrinology notes.       Type 2 diabetes mellitus with diabetic neuropathy, with long-term current use of insulin (HCC) - Primary    Chronic, ongoing, followed by endocrinology.  A1c 8.9% today and urine ALB up to date with [facility].  Continue current medication regimen and collaboration with endocrinology.  CMP up to date, continue Lisinopril for kidney protection.  Recommend patient continue to monitor BS at home.  Heavy focus on diet recommended.  Eye exam due and recommend she obtain.  Foot exam up to date.  Will work on getting endocrinology notes. Return in 6 months.      Relevant Orders   Bayer DCA Hb A1c Waived (Completed)     Genitourinary   Chronic kidney disease, stage 3a (HCC)    Chronic, ongoing.  Continue collaboration with [facility] nephrology.  Lisinopril for kidney protection. CMP up to date with [facility].  Other   Abnormal white blood cell count    Noted on recent labs, recheck today.  Continue to monitor closely.      Relevant Orders   CBC with Differential/Platelet   Obesity    BMI 30.20.  Recommended eating smaller high protein, low fat meals more frequently and exercising 30 mins a day 5  times a week with a goal of 10-15lb weight loss in the next 3 months. Patient voiced their understanding and motivation to adhere to these recommendations.       Positive ANA (antinuclear antibody)    Discussed at length with patient and re educated on reason for referral to rheumatology, she reports better understanding now.  Continue this collaboration, recent note reviewed.        Relevant Orders   CBC with Differential/Platelet     Follow up plan: Return in about 6 months (around 10/18/2022) for T2DM, HTN/HLD, THYROID, GERD, CKD.

## 2022-04-19 NOTE — Assessment & Plan Note (Signed)
S/P thyroidectomy years ago.  Followed by Dr. Rosario Jacks with endo.  Continue current medication regimen and adjust as needed.  Will work on getting endocrinology notes.

## 2022-04-19 NOTE — Assessment & Plan Note (Signed)
BMI 30.20.  Recommended eating smaller high protein, low fat meals more frequently and exercising 30 mins a day 5 times a week with a goal of 10-15lb weight loss in the next 3 months. Patient voiced their understanding and motivation to adhere to these recommendations.

## 2022-04-19 NOTE — Assessment & Plan Note (Signed)
Discussed at length with patient and re educated on reason for referral to rheumatology, she reports better understanding now.  Continue this collaboration, recent note reviewed.

## 2022-04-19 NOTE — Assessment & Plan Note (Signed)
Chronic, ongoing.  Continue collaboration with [facility] nephrology.  Lisinopril for kidney protection. CMP up to date with [facility].

## 2022-04-19 NOTE — Assessment & Plan Note (Signed)
Chronic, ongoing, followed by endocrinology.  A1c 8.9% today and urine ALB up to date with [facility].  Continue current medication regimen and collaboration with endocrinology.  CMP up to date, continue Lisinopril for kidney protection.  Recommend patient continue to monitor BS at home.  Heavy focus on diet recommended.  Eye exam due and recommend she obtain.  Foot exam up to date.  Will work on getting endocrinology notes. Return in 6 months.

## 2022-04-19 NOTE — Assessment & Plan Note (Signed)
Chronic, stable. BP at goal on recheck in office today and at goal on home checks.  Continue current medication regimen, Lisinopril for kidney protection with diabetes, and collaboration with [facility] nephrology.  Recommend she monitor BP at home at least 3 mornings a week.  LABS: up to date with nephrology.  Focus on DASH diet at home.  Return in 6 months.

## 2022-04-19 NOTE — Assessment & Plan Note (Signed)
Noted on recent labs, recheck today.  Continue to monitor closely.

## 2022-04-19 NOTE — Assessment & Plan Note (Signed)
Chronic and ongoing issue.  Continue Protonix and adjust as needed.  Plan on checking Mag level annually.  If worsening or ongoing consider referral to GI. Risks of PPI use were discussed with patient including bone loss, C. Diff diarrhea, pneumonia, infections, CKD, electrolyte abnormalities.  Verbalizes understanding and chooses to continue the medication.

## 2022-04-20 LAB — CBC WITH DIFFERENTIAL/PLATELET
Basophils Absolute: 0 10*3/uL (ref 0.0–0.2)
Basos: 1 %
EOS (ABSOLUTE): 0.1 10*3/uL (ref 0.0–0.4)
Eos: 4 %
Hematocrit: 37.3 % (ref 34.0–46.6)
Hemoglobin: 12 g/dL (ref 11.1–15.9)
Immature Grans (Abs): 0 10*3/uL (ref 0.0–0.1)
Immature Granulocytes: 0 %
Lymphocytes Absolute: 1 10*3/uL (ref 0.7–3.1)
Lymphs: 30 %
MCH: 27 pg (ref 26.6–33.0)
MCHC: 32.2 g/dL (ref 31.5–35.7)
MCV: 84 fL (ref 79–97)
Monocytes Absolute: 0.3 10*3/uL (ref 0.1–0.9)
Monocytes: 8 %
Neutrophils Absolute: 2 10*3/uL (ref 1.4–7.0)
Neutrophils: 57 %
Platelets: 164 10*3/uL (ref 150–450)
RBC: 4.45 x10E6/uL (ref 3.77–5.28)
RDW: 14 % (ref 11.7–15.4)
WBC: 3.5 10*3/uL (ref 3.4–10.8)

## 2022-04-20 LAB — LIPID PANEL W/O CHOL/HDL RATIO
Cholesterol, Total: 146 mg/dL (ref 100–199)
HDL: 44 mg/dL (ref >39)
LDL Chol Calc (NIH): 85 mg/dL (ref 0–99)
Triglycerides: 88 mg/dL (ref 0–149)
VLDL Cholesterol Cal: 17 mg/dL (ref 5–40)

## 2022-04-20 NOTE — Progress Notes (Signed)
Contacted via Branch -- however please call and ensure received message:  Good morning Mary Parks, your labs have returned and overall are nice and normal.  No changes needed.  Have a great day!! Keep being amazing!!  Thank you for allowing me to participate in your care.  I appreciate you. Kindest regards, Venia Riveron

## 2022-05-03 DIAGNOSIS — I10 Essential (primary) hypertension: Secondary | ICD-10-CM | POA: Diagnosis not present

## 2022-05-03 DIAGNOSIS — E1129 Type 2 diabetes mellitus with other diabetic kidney complication: Secondary | ICD-10-CM | POA: Diagnosis not present

## 2022-05-03 DIAGNOSIS — N1832 Chronic kidney disease, stage 3b: Secondary | ICD-10-CM | POA: Diagnosis not present

## 2022-05-03 DIAGNOSIS — Z794 Long term (current) use of insulin: Secondary | ICD-10-CM | POA: Diagnosis not present

## 2022-05-03 DIAGNOSIS — R809 Proteinuria, unspecified: Secondary | ICD-10-CM | POA: Diagnosis not present

## 2022-05-07 DIAGNOSIS — E1165 Type 2 diabetes mellitus with hyperglycemia: Secondary | ICD-10-CM | POA: Diagnosis not present

## 2022-05-07 DIAGNOSIS — I10 Essential (primary) hypertension: Secondary | ICD-10-CM | POA: Diagnosis not present

## 2022-05-07 DIAGNOSIS — E559 Vitamin D deficiency, unspecified: Secondary | ICD-10-CM | POA: Diagnosis not present

## 2022-05-07 DIAGNOSIS — E039 Hypothyroidism, unspecified: Secondary | ICD-10-CM | POA: Diagnosis not present

## 2022-05-07 DIAGNOSIS — N289 Disorder of kidney and ureter, unspecified: Secondary | ICD-10-CM | POA: Diagnosis not present

## 2022-05-13 DIAGNOSIS — Z794 Long term (current) use of insulin: Secondary | ICD-10-CM | POA: Diagnosis not present

## 2022-05-13 DIAGNOSIS — E1165 Type 2 diabetes mellitus with hyperglycemia: Secondary | ICD-10-CM | POA: Diagnosis not present

## 2022-05-31 ENCOUNTER — Telehealth: Payer: Medicare Other

## 2022-06-07 ENCOUNTER — Other Ambulatory Visit: Payer: Self-pay | Admitting: Nurse Practitioner

## 2022-06-07 NOTE — Telephone Encounter (Signed)
Requested Prescriptions  Pending Prescriptions Disp Refills   budesonide-formoterol (SYMBICORT) 160-4.5 MCG/ACT inhaler [Pharmacy Med Name: Budesonide-Formoterol Fumarate 160-4.5 MCG/ACT Inhalation Aerosol] 55 g 0    Sig: Inhale 2 puffs by mouth twice daily     Pulmonology:  Combination Products Passed - 06/07/2022  6:51 AM      Passed - Valid encounter within last 12 months    Recent Outpatient Visits           1 month ago Type 2 diabetes mellitus with diabetic neuropathy, with long-term current use of insulin ([place])   [place], Jolene T, NP   7 months ago Type 2 diabetes mellitus with diabetic neuropathy, with long-term current use of insulin ([place])   [place], [place] T, NP   1 year ago 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, with long-term current use of insulin ([place])   [place], [place] T, NP   1 year ago Type 2 diabetes mellitus with diabetic neuropathy, with long-term current use of insulin ([place])   [place], [place] T, NP   1 year ago Slow transit constipation   [place], Barbaraann Faster, NP       Future Appointments             In 4 months [place], Barbaraann Faster, NP [employer]

## 2022-07-01 DIAGNOSIS — E1165 Type 2 diabetes mellitus with hyperglycemia: Secondary | ICD-10-CM | POA: Diagnosis not present

## 2022-07-01 DIAGNOSIS — I10 Essential (primary) hypertension: Secondary | ICD-10-CM | POA: Diagnosis not present

## 2022-07-01 DIAGNOSIS — N289 Disorder of kidney and ureter, unspecified: Secondary | ICD-10-CM | POA: Diagnosis not present

## 2022-07-01 DIAGNOSIS — E559 Vitamin D deficiency, unspecified: Secondary | ICD-10-CM | POA: Diagnosis not present

## 2022-07-01 DIAGNOSIS — K219 Gastro-esophageal reflux disease without esophagitis: Secondary | ICD-10-CM | POA: Diagnosis not present

## 2022-07-01 DIAGNOSIS — E785 Hyperlipidemia, unspecified: Secondary | ICD-10-CM | POA: Diagnosis not present

## 2022-07-01 DIAGNOSIS — E039 Hypothyroidism, unspecified: Secondary | ICD-10-CM | POA: Diagnosis not present

## 2022-07-05 ENCOUNTER — Telehealth: Payer: Self-pay

## 2022-07-05 ENCOUNTER — Ambulatory Visit: Payer: 59

## 2022-07-05 NOTE — Patient Outreach (Signed)
Care Management & Coordination Services Pharmacy Note  07/05/2022 Name:  Mary Parks MRN:  [id number] DOB:  1950/08/20  Summary: Very pleasant 72 year old female presents for f/u CCM visit. She loves fishing and watching sports. Especially her Yankees.  She ordered a treadmill a few months ago but had to return it    Recommendations/Changes made from today's visit: -Sugars are elevated (See note for exact numbers) but Breakfast time is perfect, lunch is around 160-220's and dinner is always 200's. Medslist is different than what she's taking Detemir: 40 BID is what she's taking, not 45 AM and 40 per Epic Humalog: 10 AM, 8 lunch, 10 dinner is what she's taking, not 20-20-30 per EPIC Will reach out to endo and ask how to proceed  Subjective: Mary Parks is an 72 y.o. year old female who is a primary patient of Cannady, Barbaraann Faster, NP.  The care coordination team was consulted for assistance with disease management and care coordination needs.    Engaged with patient by telephone for follow up visit.   Objective:  Lab Results  Component Value Date   CREATININE 1.04 (H) 07/20/2021   BUN 14 07/20/2021   EGFR 48 (L) 02/06/2021   GFRNONAA 57 (L) 07/20/2021   GFRAA 50 (L) 07/16/2020   NA 142 07/20/2021   K 3.8 07/20/2021   CALCIUM 9.1 07/20/2021   CO2 28 07/20/2021   GLUCOSE 76 07/20/2021    Lab Results  Component Value Date/Time   HGBA1C 8.9 (H) 04/19/2022 10:57 AM   HGBA1C 9.2 (H) 10/16/2021 11:36 AM   HGBA1C 9.3 01/02/2020 12:00 AM   HGBA1C 9.7 01/05/2016 12:00 AM   MICROALBUR 150 (H) 06/05/2021 11:11 AM   MICROALBUR 150 (H) 07/16/2020 11:20 AM    Last diabetic Eye exam:  Lab Results  Component Value Date/Time   HMDIABEYEEXA No Retinopathy 08/20/2020 12:00 AM    Last diabetic Foot exam:  Lab Results  Component Value Date/Time   HMDIABFOOTEX bilateral- normal 06/28/2016 12:00 AM     Lab Results  Component Value Date   CHOL 146 04/19/2022   HDL 44  04/19/2022   LDLCALC 85 04/19/2022   TRIG 88 04/19/2022       Latest Ref Rng & Units 07/20/2021    2:20 PM 02/06/2021   11:16 AM 07/16/2020   11:22 AM  Hepatic Function  Total Protein 6.5 - 8.1 g/dL 7.5  7.0  6.9   Albumin 3.5 - 5.0 g/dL 3.7  4.1  4.1   AST 15 - 41 U/L 18  21  20   $ ALT 0 - 44 U/L 16  20  17   $ Alk Phosphatase 38 - 126 U/L 76  105  111   Total Bilirubin 0.3 - 1.2 mg/dL 0.7  0.4  0.4     Lab Results  Component Value Date/Time   TSH 0.727 06/05/2021 11:14 AM   TSH 1.840 07/16/2020 11:22 AM   FREET4 1.50 06/05/2021 11:14 AM   FREET4 1.43 07/16/2020 11:22 AM       Latest Ref Rng & Units 04/19/2022   11:00 AM 10/16/2021   11:40 AM 07/20/2021    2:20 PM  CBC  WBC 3.4 - 10.8 x10E3/uL 3.5  3.4  3.8   Hemoglobin 11.1 - 15.9 g/dL 12.0  12.7  12.3   Hematocrit 34.0 - 46.6 % 37.3  39.1  38.8   Platelets 150 - 450 x10E3/uL 164  220  194     Lab Results  Component Value Date/Time   VD25OH 33.4 06/05/2021 11:14 AM   VD25OH 53.8 01/02/2020 12:00 AM   VITAMINB12 541 04/09/2020 01:58 PM   VITAMINB12 501 05/29/2018 11:42 AM    Clinical ASCVD: Yes  The 10-year ASCVD risk score (Arnett DK, et al., 2019) is: 26%   Values used to calculate the score:     Age: 89 years     Sex: Female     Is Non-Hispanic African American: Yes     Diabetic: Yes     Tobacco smoker: No     Systolic Blood Pressure: [id number] mmHg     Is BP treated: Yes     HDL Cholesterol: 44 mg/dL     Total Cholesterol: 146 mg/dL    Other: (CHADS2VASc if Afib, MMRC or CAT for COPD, ACT, DEXA)     04/19/2022   10:58 AM 12/30/2021   11:43 AM 10/16/2021   11:32 AM  Depression screen PHQ 2/9  Decreased Interest 0 0 0  Down, Depressed, Hopeless 0 0 0  PHQ - 2 Score 0 0 0  Altered sleeping 0  1  Tired, decreased energy 1  1  Change in appetite 0  0  Feeling bad or failure about yourself  0  0  Trouble concentrating 0  0  Moving slowly or fidgety/restless 0  0  Suicidal thoughts 0  0  PHQ-9 Score 1  2   Difficult doing work/chores Not difficult at all       Social History   Tobacco Use  Smoking Status Never  Smokeless Tobacco Never   BP Readings from Last 3 Encounters:  04/19/22 128/66  01/27/22 134/74  10/16/21 112/69   Pulse Readings from Last 3 Encounters:  04/19/22 70  01/27/22 76  10/16/21 76   Wt Readings from Last 3 Encounters:  04/19/22 176 lb (79.8 kg)  01/27/22 174 lb 2.6 oz (79 kg)  10/16/21 174 lb 3.2 oz (79 kg)   BMI Readings from Last 3 Encounters:  04/19/22 30.20 kg/m  01/27/22 29.90 kg/m  10/16/21 29.90 kg/m    Allergies  Allergen Reactions   Celebrex [Celecoxib]     hallucinations   Sulfa Antibiotics    Elemental Sulfur Itching   Sulfisoxazole Rash    Medications Reviewed Today     Reviewed by Venita Lick, NP (Nurse Practitioner) on 04/19/22 at 1146  Med List Status: <None>   Medication Order Taking? Sig Documenting Provider Last Dose Status Informant  albuterol (VENTOLIN HFA) 108 (90 Base) MCG/ACT inhaler [id number] Yes Inhale 2 puffs into the lungs every 6 (six) hours as needed for wheezing or shortness of breath. [name] T, NP Taking Active   aspirin EC 81 MG tablet [id number] Yes Take 81 mg by mouth daily. [provider] Taking Active   azithromycin (ZITHROMAX) 250 MG tablet [id number] Yes Take 2 tablets on day 1, then 1 tablet daily on days 2 through 5 Cannady, Jolene T, NP  Active   budesonide-formoterol (SYMBICORT) 160-4.5 MCG/ACT inhaler [id number] Yes Inhale 2 puffs into the lungs 2 (two) times daily. Venita Lick, NP Taking Active   calcium-vitamin D (OSCAL WITH D) 500-200 MG-UNIT tablet [id number] Yes Take 1 tablet by mouth. [provider] Taking Active   Dulaglutide (TRULICITY) 1.5 0000000 SOPN [id number] Yes INJECT 1 SYRINGE SUBCUTANEOUSLY ONCE A WEEK Cannady, Jolene T, NP Taking Active   FARXIGA 10 MG TABS tablet [id number] Yes Take 1 tablet (10 mg total) by mouth daily. Venita Lick, NP  Taking Active   fluticasone (FLONASE) 50 MCG/ACT nasal spray [id number] Yes Use 2 spray(s) in each nostril once daily Cannady, Jolene T, NP Taking Active   insulin glargine (LANTUS SOLOSTAR) 100 UNIT/ML Solostar Pen [id number] Yes INJECT 45 UNITS SUBCUTANEOUSLY IN MORNING AND 40 UNITS AT NIGHT. [name] T, NP Taking Active   insulin lispro (HUMALOG) 100 UNIT/ML KwikPen [id number] Yes INJECT 20 UNITS SUBCTUANEOUSLY BEFORE BREAKFAST AND LUNCH, AND 30 UNITS BEFORE EVENING MEAL. [name] T, NP Taking Active   levothyroxine (SYNTHROID) 88 MCG tablet [id number] Yes Take 1 tablet (88 mcg total) by mouth every morning. [name] T, NP Taking Active   lisinopril (ZESTRIL) 30 MG tablet [id number] Yes Take 1 tablet (30 mg total) by mouth daily. [name] T, NP Taking Active   meclizine (ANTIVERT) 12.5 MG tablet [id number] Yes Take 1 tablet (12.5 mg total) by mouth 3 (three) times daily as needed for dizziness. [name] T, NP Taking Active   metoprolol tartrate (LOPRESSOR) 25 MG tablet [id number] Yes Take 1 tablet (25 mg total) by mouth 2 (two) times daily. [name] T, NP Taking Active   montelukast (SINGULAIR) 10 MG tablet [id number] Yes Take 1 tablet (10 mg total) by mouth at bedtime. [name] T, NP Taking Active   ONE TOUCH ULTRA TEST test strip [id number] Yes 1 each by Other route 2 (two) times daily.  [provider] Taking Active            Med Note Kelby Aline Oct 24, 2019  3:38 PM)    Jonetta Speak LANCETS [id number] MISC [id number] Yes 2 (two) times daily.  [provider] Taking Active            Med Note De [place]   Wed Oct 24, 2019  3:38 PM)    pantoprazole (PROTONIX) 40 MG tablet [id number] Yes Take 1 tablet (40 mg total) by mouth daily. [name] T, NP Taking Active   rosuvastatin (CRESTOR) 40 MG tablet [id number] Yes Take 1 tablet (40 mg total) by mouth at bedtime. Venita Lick, NP Taking Active              SDOH:  (Social Determinants of Health) assessments and interventions performed: Yes SDOH Interventions    Flowsheet Row Care Coordination from 07/05/2022 in [facility] Management from 01/18/2022 in [facility] Support from 12/30/2021 in [facility] Management from 09/28/2021 in [facility] from 12/28/2019 in [facility]  SDOH Interventions       Food Insecurity Interventions -- -- Intervention Not Indicated Intervention Not Indicated --  Housing Interventions -- -- Intervention Not Indicated -- --  Transportation Interventions Intervention Not Indicated Intervention Not Indicated Intervention Not Indicated Intervention Not Indicated --  Depression Interventions/Treatment  -- -- -- -- PHQ2-9 Score <4 Follow-up Not Indicated  Financial Strain Interventions Intervention Not Indicated Intervention Not Indicated Intervention Not Indicated -- --  Physical Activity Interventions -- -- Intervention Not Indicated -- --  Social Connections Interventions -- -- Intervention Not Indicated -- --       Medication Assistance: None required.  Patient affirms current coverage meets needs.   Name and location of Current pharmacy:  [facility] [address] [place] [address] Phone: [phone] Fax: [place], [address] [place] [address] Phone: [phone] Fax: [phone]    Assessment/Plan   Hypertension (BP goal <120/80 per neph) BP Readings from Last 3 Encounters:  04/19/22 128/66  01/27/22 134/74  10/16/21 112/69  -Controlled -Current treatment: Lisinopril 30 mg once daily Appropriate, Effective, Safe, Accessible -Medications previously tried: Amlodipine -Current home  readings: none provided -Current dietary habits: see DM -Current exercise habits: see DM -Denies hypotensive/hypertensive symptoms -Educated on BP goals and benefits of medications for prevention of heart attack, stroke and kidney damage; Daily salt intake goal < 2300 mg; Exercise goal of 150 minutes per week; Importance of home blood pressure monitoring; -Counseled to monitor BP at home 1-2x/wk, document, and provide log at future appointments -Counseled on diet and exercise extensively Recommended to continue current medication   Diabetes (A1c goal <8%) Lab Results  Component Value Date   HGBA1C 8.9 (H) 04/19/2022   HGBA1C 9.2 (H) 10/16/2021   HGBA1C 9.5 (H) 06/05/2021   Lab Results  Component Value Date   MICROALBUR 150 (H) 06/05/2021   LDLCALC 85 04/19/2022   CREATININE 1.04 (H) 07/20/2021    Lab Results  Component Value Date   NA 142 07/20/2021   K 3.8 07/20/2021   CREATININE 1.04 (H) 07/20/2021   EGFR 48 (L) 02/06/2021   GFRNONAA 57 (L) 07/20/2021   GLUCOSE 76 07/20/2021    Lab Results  Component Value Date   WBC 3.5 04/19/2022   HGB 12.0 04/19/2022   HCT 37.3 04/19/2022   MCV 84 04/19/2022   PLT 164 04/19/2022    Lab Results  Component Value Date   LABMICR See below: 06/05/2021   LABMICR See below: 02/06/2021   MICROALBUR 150 (H) 06/05/2021   MICROALBUR 150 (H) 07/16/2020  -Uncontrolled  -[facility]phrology: [facility]  - sees [place] -Endocrinology: [facility] sees Casilda Carls -Current medications: Trulicity 1.5 mg once weekly  Appropriate, Query effective,  Lantus 40 units AM 40 units PM Query Appropriate,  Lispro 10 units before breakfast and 6 lunch, 10 units before evening meal Query Appropriate,  Farxiga 10 mg once daily  Appropriate, Query effective, ,  -Current home glucose readings May 2023  90-130 fasting, 200+ evening  June 2023  FBG 80s-90s, 200 + evening  August 2023: o AM readings   01/18/22: 113  01/17/22: 110  01/16/22: 98  01/15/22: 115  01/17/22: 250  01/16/22: 235  01/15/22: 240 Feb 2024: 07/04/22: -110 AM -160 Lunch -201 Dinner 07/03/22: 86 AM 130 lunch 220 Dinner 07/02/22: 86 AM 175 Lunch 220 Dinner 07/01/22:  90 AM 280 lunch 230 Dinner  06/30/22 90 AM 201 Lunch 240 PM -Denies hypoglycemic/hyperglycemic symptoms -Current meal patterns:  May 2023: has cut down on fried foods  breakfast: cereal + water  lunch: snacks - apple juice, bbq sandwich   dinner: not sure -Current exercise: no formal exercise May 2023: no updates June 2023: ordered treadmill, plans to start exercising  -Educated on A1c and blood sugar goals; Complications of diabetes including kidney damage, retinal damage, and cardiovascular disease; Prevention and management of hypoglycemic episodes; August 2023: Sugars not at goal. Will have CCM team call every 2 weeks to get last week's worth of readings. Patient over-basalized but would be too difficult now to adjust Feb 2024: Sugars are elevated (See note for exact numbers) but Breakfast time is perfect, lunch is around 160-220's and dinner is always 200's. Medslist is different than what she's taking Detemir: 40  BID is what she's taking, not 45 AM and 40 per Epic Humalog: 10 AM, 8 lunch, 10 dinner is what she's taking, not 20-20-30 per [facility] Will reach out to endo and ask how to proceed  CPP f/U May 2024  [place] Constable, Pharm.D. - [phone]

## 2022-07-05 NOTE — Telephone Encounter (Signed)
PA for Trulicity [id number] has been approved

## 2022-08-04 ENCOUNTER — Telehealth: Payer: Self-pay

## 2022-08-05 ENCOUNTER — Telehealth: Payer: Self-pay

## 2022-08-05 NOTE — Progress Notes (Cosign Needed)
Called patient to go over blood sugar readings. Unable to reach patient, left voice mail to return call.  Mary Parks

## 2022-08-06 NOTE — Progress Notes (Signed)
Called and spoke with patient this morning to discuss her blood sugar readings. Patient gave readings for the last 3 days before breakfast.  113 08/06/22 120 08/05/22 109 08/04/22 175 around lunch time and about 6:30 pm it is usually in the 200.   Spoke with Lenna Sciara the receptionist at [facility] who states that she will give the message to Dr. Rosario Jacks and will get in touch with the patient or either the Clinical Pharmacist.  Ethelene Hal

## 2022-08-12 ENCOUNTER — Other Ambulatory Visit: Payer: Self-pay | Admitting: Nurse Practitioner

## 2022-08-12 NOTE — Telephone Encounter (Signed)
Requested Prescriptions  Pending Prescriptions Disp Refills   LANTUS SOLOSTAR 100 UNIT/ML Solostar Pen [Pharmacy Med Name: Lantus SoloStar 100 UNIT/ML Subcutaneous Solution Pen-injector] 180 mL 0    Sig: INJECT 45 UNITS SUBCUTANEOUSLY IN THE MORNING AND 40 UNITS AT NIGHT     Endocrinology:  Diabetes - Insulins Failed - 08/12/2022  6:51 AM      Failed - HBA1C is between 0 and 7.9 and within 180 days    Hemoglobin A1C  Date Value Ref Range Status  01/02/2020 9.3  Final   HB A1C (BAYER DCA - WAIVED)  Date Value Ref Range Status  04/19/2022 8.9 (H) 4.8 - 5.6 % Final    Comment:             Prediabetes: 5.7 - 6.4          Diabetes: >6.4          Glycemic control for adults with diabetes: <7.0          Passed - Valid encounter within last 6 months    Recent Outpatient Visits           3 months ago Type 2 diabetes mellitus with diabetic neuropathy, with long-term current use of insulin ([place])   [place], [place] T, NP   10 months ago Type 2 diabetes mellitus with diabetic neuropathy, with long-term current use of insulin ([place])   [place], [place] T, NP   1 year ago 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, with long-term current use of insulin ([place])   [place], [place] T, NP   1 year ago Type 2 diabetes mellitus with diabetic neuropathy, with long-term current use of insulin ([place])   [place], Henrine Screws T, NP   1 year ago Slow transit constipation   [place], Barbaraann Faster, NP       Future Appointments             In 2 months Cannady, Barbaraann Faster, NP [place]

## 2022-08-26 DIAGNOSIS — G8929 Other chronic pain: Secondary | ICD-10-CM | POA: Diagnosis not present

## 2022-08-26 DIAGNOSIS — M549 Dorsalgia, unspecified: Secondary | ICD-10-CM | POA: Diagnosis not present

## 2022-08-26 DIAGNOSIS — R7989 Other specified abnormal findings of blood chemistry: Secondary | ICD-10-CM | POA: Diagnosis not present

## 2022-08-26 DIAGNOSIS — N179 Acute kidney failure, unspecified: Secondary | ICD-10-CM | POA: Diagnosis not present

## 2022-09-01 ENCOUNTER — Telehealth: Payer: Self-pay

## 2022-09-01 NOTE — Progress Notes (Cosign Needed)
Care Management & Coordination Services Pharmacy Team  Reason for Encounter: Diabetes  Contacted patient to discuss diabetes disease state. Spoke with patient on 09/01/2022   Recent office visits:  None noted  Recent consult visits:  08/26/22:Surya Manuvannan,MD (Nephrology) Seen for chronic kidney disease.  07/28/22-William MoorheadDefoor, MD (Rheumatology) Notes not available.   Hospital visits:  None in previous 6 months  Medications: Outpatient Encounter Medications as of 09/01/2022  Medication Sig   albuterol (VENTOLIN HFA) 108 (90 Base) MCG/ACT inhaler Inhale 2 puffs into the lungs every 6 (six) hours as needed for wheezing or shortness of breath.   aspirin EC 81 MG tablet Take 81 mg by mouth daily.   budesonide-formoterol (SYMBICORT) 160-4.5 MCG/ACT inhaler Inhale 2 puffs by mouth twice daily   calcium-vitamin D (OSCAL WITH D) 500-200 MG-UNIT tablet Take 1 tablet by mouth.   Dulaglutide (TRULICITY) 1.5 MG/0.5ML SOPN INJECT 1 SYRINGE SUBCUTANEOUSLY ONCE A WEEK   FARXIGA 10 MG TABS tablet Take 1 tablet (10 mg total) by mouth daily.   fluticasone (FLONASE) 50 MCG/ACT nasal spray Use 2 spray(s) in each nostril once daily   insulin lispro (HUMALOG) 100 UNIT/ML KwikPen INJECT 20 UNITS SUBCTUANEOUSLY BEFORE BREAKFAST AND LUNCH, AND 30 UNITS BEFORE EVENING MEAL.   LANTUS SOLOSTAR 100 UNIT/ML Solostar Pen INJECT 45 UNITS SUBCUTANEOUSLY IN THE MORNING AND 40 UNITS AT NIGHT   levothyroxine (SYNTHROID) 88 MCG tablet Take 1 tablet (88 mcg total) by mouth every morning.   lisinopril (ZESTRIL) 30 MG tablet Take 1 tablet (30 mg total) by mouth daily.   meclizine (ANTIVERT) 12.5 MG tablet Take 1 tablet (12.5 mg total) by mouth 3 (three) times daily as needed for dizziness.   metoprolol tartrate (LOPRESSOR) 25 MG tablet Take 1 tablet (25 mg total) by mouth 2 (two) times daily.   montelukast (SINGULAIR) 10 MG tablet Take 1 tablet (10 mg total) by mouth at bedtime.   ONE TOUCH ULTRA TEST test  strip 1 each by Other route 2 (two) times daily.    ONETOUCH DELICA LANCETS 33G MISC 2 (two) times daily.    pantoprazole (PROTONIX) 40 MG tablet Take 1 tablet (40 mg total) by mouth daily.   rosuvastatin (CRESTOR) 40 MG tablet Take 1 tablet (40 mg total) by mouth at bedtime.   No facility-administered encounter medications on file as of 09/01/2022.    Recent Relevant Labs: Lab Results  Component Value Date/Time   HGBA1C 8.9 (H) 04/19/2022 10:57 AM   HGBA1C 9.2 (H) 10/16/2021 11:36 AM   HGBA1C 9.3 01/02/2020 12:00 AM   HGBA1C 9.7 01/05/2016 12:00 AM   MICROALBUR 150 (H) 06/05/2021 11:11 AM   MICROALBUR 150 (H) 07/16/2020 11:20 AM    Kidney Function Lab Results  Component Value Date/Time   CREATININE 1.04 (H) 07/20/2021 02:20 PM   CREATININE 1.20 (H) 02/06/2021 11:16 AM   GFRNONAA 57 (L) 07/20/2021 02:20 PM   GFRAA 50 (L) 07/16/2020 11:22 AM   Current antihyperglycemic regimen:  Trulicity 1.5mg  Inject 1.5mg  weekly Lantus solostar 100 units inject 45 units in the morning & 40 at bedtime Farxiga 10 mg take 1 tablet daily    Patient verbally confirms she is taking the above medications as directed. Yes  What diet changes have been made to improve diabetes control? Patient states she has cut back on her sugary foods and has lost about 13 lbs ever since.        What recent interventions/DTPs have been made to improve glycemic control:  None noted  Have  there been any recent hospitalizations or ED visits since last visit with PharmD? No  Patient denies hypoglycemic symptoms, including Pale, Sweaty, Shaky, Hungry, Nervous/irritable, and Vision changes  Patient denies hyperglycemic symptoms, including blurry vision, excessive thirst, fatigue, polyuria, and weakness  How often are you checking your blood sugar? twice daily  What are your blood sugars ranging?  Fasting:113-98 Before meals:  After meals: 114 Bedtime: 160-170 Patient has given me ranges on her blood sugar  readings but not for 7 days worth of readings.   During the week, how often does your blood glucose drop below 70? Never  Are you checking your feet daily/regularly? Yes  Adherence Review: Is the patient currently on a STATIN medication? Yes Is the patient currently on ACE/ARB medication? No Does the patient have >5 day gap between last estimated fill dates? No  Star Rating Drugs:  Trulicity 1.5 mg Last filled:06/05/22 28 DS, 05/12/22 28 DS Farxiga 10 mg Last filled:07/27/22 90 DS, 05/31/22 60 DS Humalog 100 units Last filled:01/01/22 22 DS, 12/14/21 22 DS Lantus solostar 100 untis Last filled:08/13/22 89 DS, 07/10/22 36 DS Rosuvastatin 40 mg Last filled:07/04/22 90 DS, 04/09/22 90 DS   Care Gaps: Annual wellness visit in last year? Yes Last eye exam / retinopathy screening:08/20/21 Last diabetic foot exam:06/05/22   Rance Muir, RMA

## 2022-09-03 IMAGING — CR DG ABDOMEN 1V
2 series · 2 of 2 positions shown · non-contrast
Comparison: 04/05/2020

CLINICAL DATA: RIGHT flank pain and urinary odor for 1 week

EXAM:
ABDOMEN - 1 VIEW

[abdomen kub (1 of 2)]
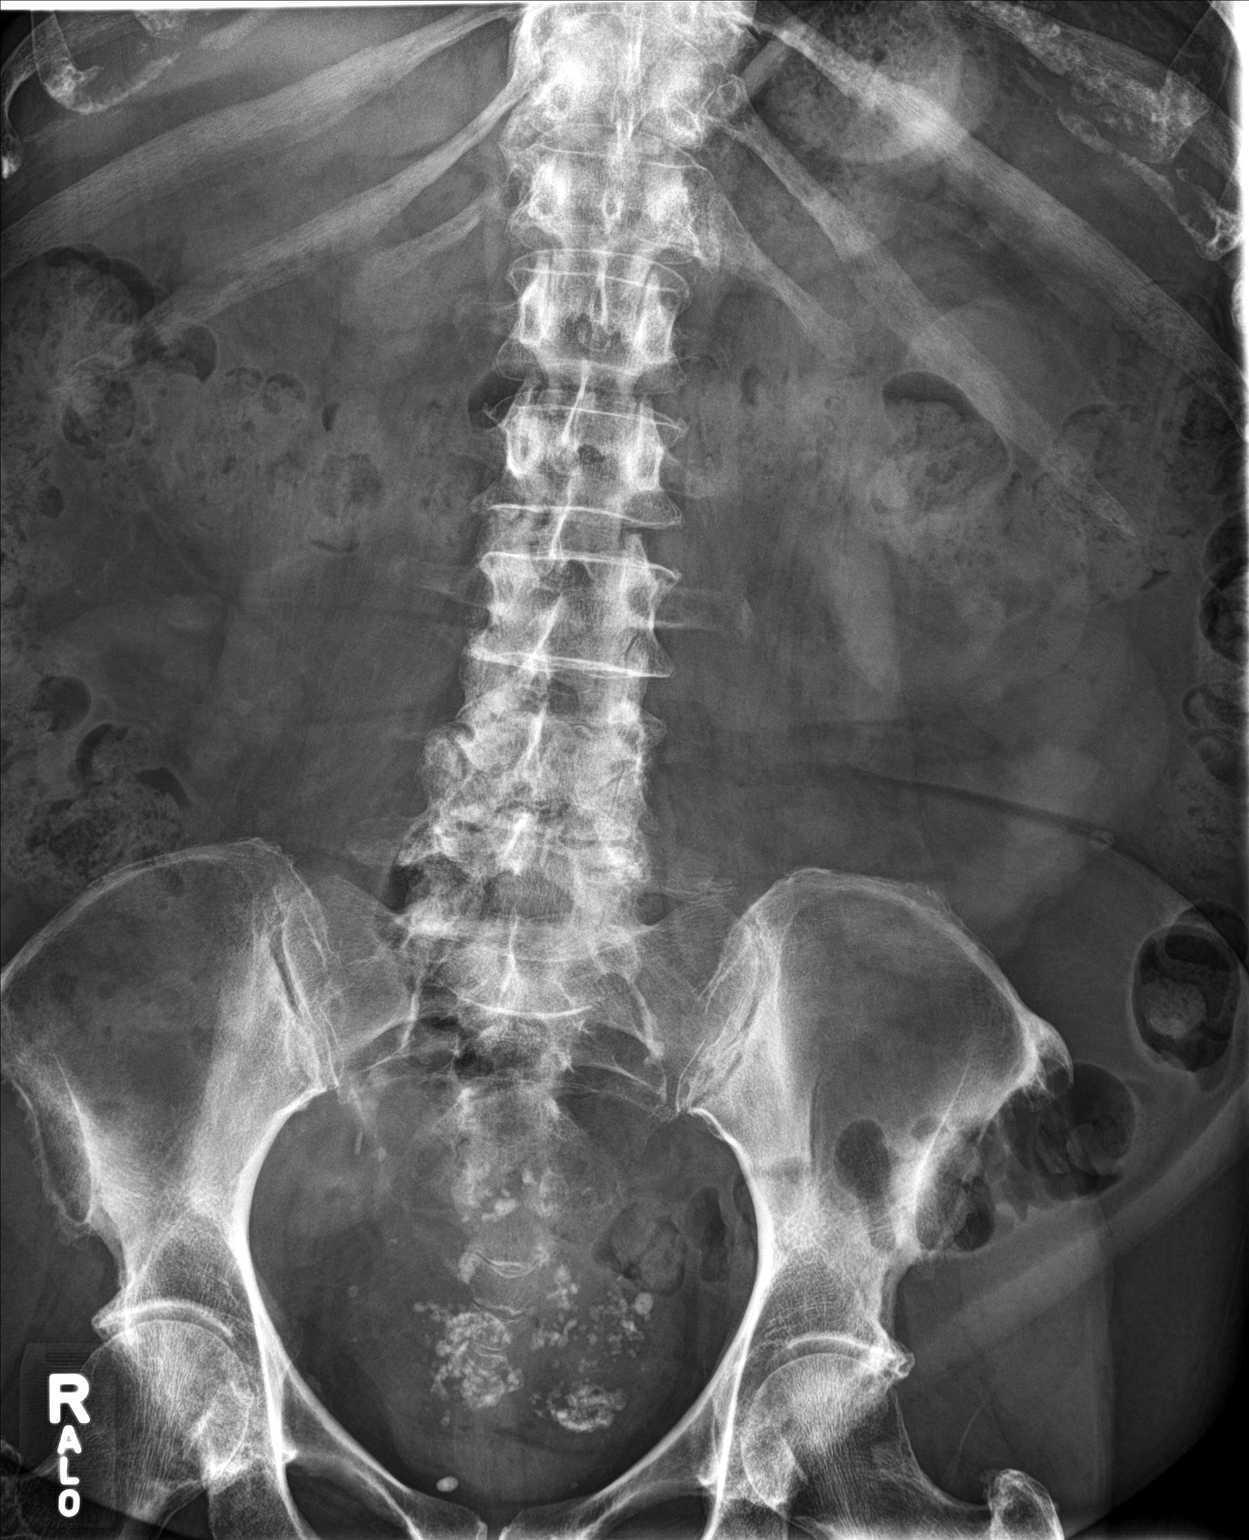

[abdomen kub (2 of 2)]
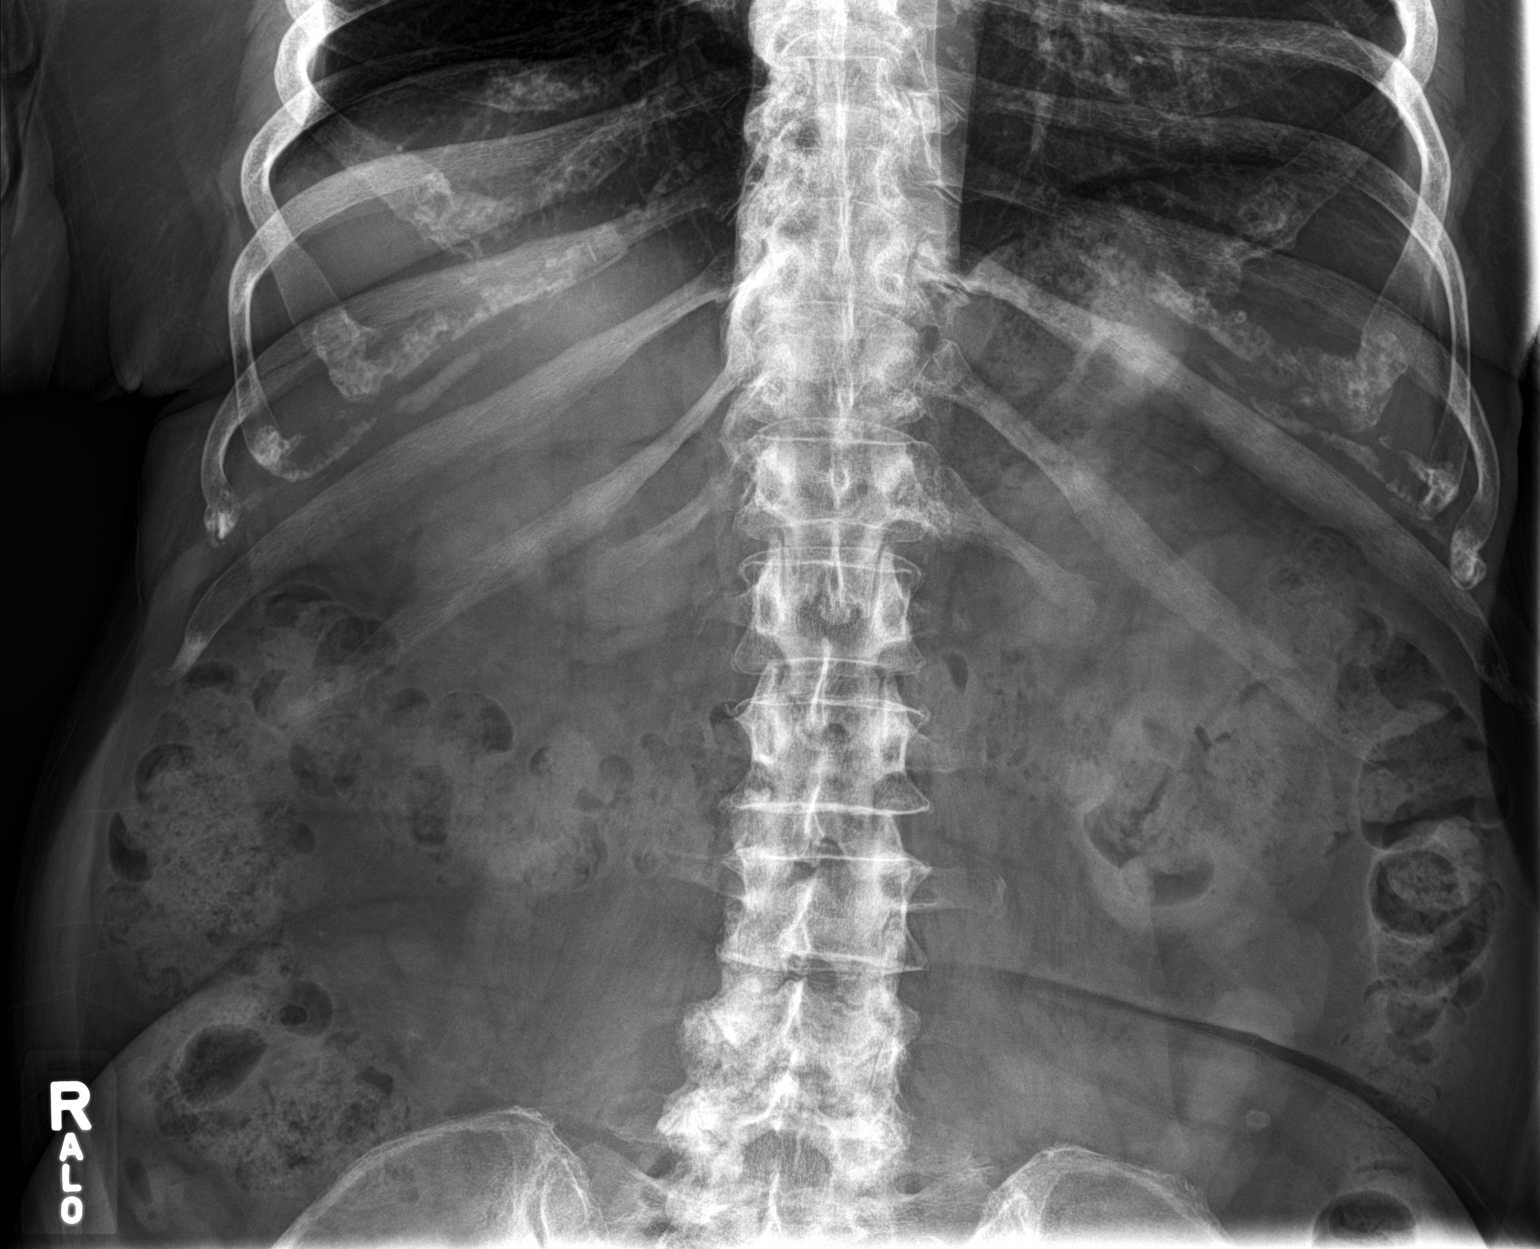

[2 of 2 positions shown; findings below may reference images not displayed]

FINDINGS: Calcified uterine leiomyomata and pelvis.

No urinary tract calcification.

Bowel gas pattern normal.

Bones demineralized with degenerative changes of thoracolumbar
spine.
IMPRESSION: No acute abnormalities.

## 2022-09-15 ENCOUNTER — Other Ambulatory Visit: Payer: Self-pay | Admitting: Nurse Practitioner

## 2022-09-15 DIAGNOSIS — Z1231 Encounter for screening mammogram for malignant neoplasm of breast: Secondary | ICD-10-CM

## 2022-10-07 DIAGNOSIS — E1165 Type 2 diabetes mellitus with hyperglycemia: Secondary | ICD-10-CM | POA: Diagnosis not present

## 2022-10-07 DIAGNOSIS — E559 Vitamin D deficiency, unspecified: Secondary | ICD-10-CM | POA: Diagnosis not present

## 2022-10-07 DIAGNOSIS — E785 Hyperlipidemia, unspecified: Secondary | ICD-10-CM | POA: Diagnosis not present

## 2022-10-07 DIAGNOSIS — E039 Hypothyroidism, unspecified: Secondary | ICD-10-CM | POA: Diagnosis not present

## 2022-10-07 DIAGNOSIS — I10 Essential (primary) hypertension: Secondary | ICD-10-CM | POA: Diagnosis not present

## 2022-10-07 DIAGNOSIS — N289 Disorder of kidney and ureter, unspecified: Secondary | ICD-10-CM | POA: Diagnosis not present

## 2022-10-13 ENCOUNTER — Encounter: Payer: Self-pay | Admitting: Nurse Practitioner

## 2022-10-13 ENCOUNTER — Ambulatory Visit
Admission: RE | Admit: 2022-10-13 | Discharge: 2022-10-13 | Disposition: A | Discharge status: Home / Self Care | Payer: 59 | Source: Ambulatory Visit | Attending: Nurse Practitioner | Admitting: Nurse Practitioner

## 2022-10-13 DIAGNOSIS — Z1231 Encounter for screening mammogram for malignant neoplasm of breast: Secondary | ICD-10-CM | POA: Insufficient documentation

## 2022-10-14 NOTE — Progress Notes (Signed)
Contacted via MyChart   Normal mammogram, may repeat in one year:)

## 2022-10-15 DIAGNOSIS — H26492 Other secondary cataract, left eye: Secondary | ICD-10-CM | POA: Diagnosis not present

## 2022-10-15 DIAGNOSIS — E119 Type 2 diabetes mellitus without complications: Secondary | ICD-10-CM | POA: Diagnosis not present

## 2022-10-15 LAB — HM DIABETES EYE EXAM

## 2022-10-17 NOTE — Patient Instructions (Signed)
Diabetes Mellitus Basics  Diabetes mellitus, or diabetes, is a long-term (chronic) disease. It occurs when the body does not properly use sugar (glucose) that is released from food after you eat. Diabetes mellitus may be caused by one or both of these problems: Your pancreas does not make enough of a hormone called insulin. Your body does not react in a normal way to the insulin that it makes. Insulin lets glucose enter cells in your body. This gives you energy. If you have diabetes, glucose cannot get into cells. This causes high blood glucose (hyperglycemia). How to treat and manage diabetes You may need to take insulin or other diabetes medicines daily to keep your glucose in balance. If you are prescribed insulin, you will learn how to give yourself insulin by injection. You may need to adjust the amount of insulin you take based on the foods that you eat. You will need to check your blood glucose levels using a glucose monitor as told by your health care provider. The readings can help determine if you have low or high blood glucose. Generally, you should have these blood glucose levels: Before meals (preprandial): 80-130 mg/dL (4.4-7.2 mmol/L). After meals (postprandial): below 180 mg/dL (10 mmol/L). Hemoglobin A1c (HbA1c) level: less than 7%. Your health care provider will set treatment goals for you. Keep all follow-up visits. This is important. Follow these instructions at home: Diabetes medicines Take your diabetes medicines every day as told by your health care provider. List your diabetes medicines here: Name of medicine: ______________________________ Amount (dose): _______________ Time (a.m./p.m.): _______________ Notes: ___________________________________ Name of medicine: ______________________________ Amount (dose): _______________ Time (a.m./p.m.): _______________ Notes: ___________________________________ Name of medicine: ______________________________ Amount (dose):  _______________ Time (a.m./p.m.): _______________ Notes: ___________________________________ Insulin If you use insulin, list the types of insulin you use here: Insulin type: ______________________________ Amount (dose): _______________ Time (a.m./p.m.): _______________Notes: ___________________________________ Insulin type: ______________________________ Amount (dose): _______________ Time (a.m./p.m.): _______________ Notes: ___________________________________ Insulin type: ______________________________ Amount (dose): _______________ Time (a.m./p.m.): _______________ Notes: ___________________________________ Insulin type: ______________________________ Amount (dose): _______________ Time (a.m./p.m.): _______________ Notes: ___________________________________ Insulin type: ______________________________ Amount (dose): _______________ Time (a.m./p.m.): _______________ Notes: ___________________________________ Managing blood glucose  Check your blood glucose levels using a glucose monitor as told by your health care provider. Write down the times that you check your glucose levels here: Time: _______________ Notes: ___________________________________ Time: _______________ Notes: ___________________________________ Time: _______________ Notes: ___________________________________ Time: _______________ Notes: ___________________________________ Time: _______________ Notes: ___________________________________ Time: _______________ Notes: ___________________________________  Low blood glucose Low blood glucose (hypoglycemia) is when glucose is at or below 70 mg/dL (3.9 mmol/L). Symptoms may include: Feeling: Hungry. Sweaty and clammy. Irritable or easily upset. Dizzy. Sleepy. Having: A fast heartbeat. A headache. A change in your vision. Numbness around the mouth, lips, or tongue. Having trouble with: Moving (coordination). Sleeping. Treating low blood glucose To treat low blood  glucose, eat or drink something containing sugar right away. If you can think clearly and swallow safely, follow the 15:15 rule: Take 15 grams of a fast-acting carb (carbohydrate), as told by your health care provider. Some fast-acting carbs are: Glucose tablets: take 3-4 tablets. Hard candy: eat 3-5 pieces. Fruit juice: drink 4 oz (120 mL). Regular (not diet) soda: drink 4-6 oz (120-180 mL). Honey or sugar: eat 1 Tbsp (15 mL). Check your blood glucose levels 15 minutes after you take the carb. If your glucose is still at or below 70 mg/dL (3.9 mmol/L), take 15 grams of a carb again. If your glucose does not go above 70 mg/dL (3.9 mmol/L) after   3 tries, get help right away. After your glucose goes back to normal, eat a meal or a snack within 1 hour. Treating very low blood glucose If your glucose is at or below 54 mg/dL (3 mmol/L), you have very low blood glucose (severe hypoglycemia). This is an emergency. Do not wait to see if the symptoms will go away. Get medical help right away. Call your local emergency services (911 in the [place] Do not drive yourself to the hospital. Questions to ask your health care provider Should I talk with a diabetes educator? What equipment will I need to care for myself at home? What diabetes medicines do I need? When should I take them? How often do I need to check my blood glucose levels? What number can I call if I have questions? When is my follow-up visit? Where can I find a support group for people with diabetes? Where to find more information American Diabetes Association: www.diabetes.org Association of Diabetes Care and Education Specialists: www.diabeteseducator.org Contact a health care provider if: Your blood glucose is at or above 240 mg/dL (13.3 mmol/L) for 2 days in a row. You have been sick or have had a fever for 2 days or more, and you are not getting better. You have any of these problems for more than 6 hours: You cannot eat or  drink. You feel nauseous. You vomit. You have diarrhea. Get help right away if: Your blood glucose is lower than 54 mg/dL (3 mmol/L). You get confused. You have trouble thinking clearly. You have trouble breathing. These symptoms may represent a serious problem that is an emergency. Do not wait to see if the symptoms will go away. Get medical help right away. Call your local emergency services (911 in the [place] Do not drive yourself to the hospital. Summary Diabetes mellitus is a chronic disease that occurs when the body does not properly use sugar (glucose) that is released from food after you eat. Take insulin and diabetes medicines as told. Check your blood glucose every day, as often as told. Keep all follow-up visits. This is important. This information is not intended to replace advice given to you by your health care provider. Make sure you discuss any questions you have with your health care provider. Document Revised: 09/11/2019 Document Reviewed: 09/11/2019 Elsevier Patient Education  2024 Elsevier Inc.  

## 2022-10-20 ENCOUNTER — Encounter: Payer: Self-pay | Admitting: Nurse Practitioner

## 2022-10-20 ENCOUNTER — Ambulatory Visit (INDEPENDENT_AMBULATORY_CARE_PROVIDER_SITE_OTHER): Payer: 59 | Admitting: Nurse Practitioner

## 2022-10-20 VITALS — BP 139/78 | HR 76 | Temp 97.7°F | Ht 64.02 in | Wt 176.0 lb

## 2022-10-20 DIAGNOSIS — N1831 Chronic kidney disease, stage 3a: Secondary | ICD-10-CM

## 2022-10-20 DIAGNOSIS — I129 Hypertensive chronic kidney disease with stage 1 through stage 4 chronic kidney disease, or unspecified chronic kidney disease: Secondary | ICD-10-CM

## 2022-10-20 DIAGNOSIS — E1169 Type 2 diabetes mellitus with other specified complication: Secondary | ICD-10-CM | POA: Diagnosis not present

## 2022-10-20 DIAGNOSIS — E890 Postprocedural hypothyroidism: Secondary | ICD-10-CM

## 2022-10-20 DIAGNOSIS — E0822 Diabetes mellitus due to underlying condition with diabetic chronic kidney disease: Secondary | ICD-10-CM | POA: Diagnosis not present

## 2022-10-20 DIAGNOSIS — D729 Disorder of white blood cells, unspecified: Secondary | ICD-10-CM

## 2022-10-20 DIAGNOSIS — E1122 Type 2 diabetes mellitus with diabetic chronic kidney disease: Secondary | ICD-10-CM

## 2022-10-20 DIAGNOSIS — K219 Gastro-esophageal reflux disease without esophagitis: Secondary | ICD-10-CM | POA: Diagnosis not present

## 2022-10-20 DIAGNOSIS — J4520 Mild intermittent asthma, uncomplicated: Secondary | ICD-10-CM

## 2022-10-20 DIAGNOSIS — Z794 Long term (current) use of insulin: Secondary | ICD-10-CM

## 2022-10-20 DIAGNOSIS — R768 Other specified abnormal immunological findings in serum: Secondary | ICD-10-CM

## 2022-10-20 DIAGNOSIS — E6609 Other obesity due to excess calories: Secondary | ICD-10-CM

## 2022-10-20 DIAGNOSIS — E785 Hyperlipidemia, unspecified: Secondary | ICD-10-CM | POA: Diagnosis not present

## 2022-10-20 DIAGNOSIS — E1140 Type 2 diabetes mellitus with diabetic neuropathy, unspecified: Secondary | ICD-10-CM

## 2022-10-20 LAB — MICROALBUMIN, URINE WAIVED
Creatinine, Urine Waived: 100 mg/dL (ref 10–300)
Microalb, Ur Waived: 150 mg/L — ABNORMAL HIGH (ref 0–19)
Microalb/Creat Ratio: >300 mg/g — ABNORMAL HIGH (ref <30)

## 2022-10-20 LAB — BAYER DCA HB A1C WAIVED: HB A1C (BAYER DCA - WAIVED): 7.8 % — ABNORMAL HIGH (ref 4.8–5.6)

## 2022-10-20 MED ORDER — MECLIZINE HCL 12.5 MG PO TABS: 12.5000 mg | ORAL_TABLET | Freq: Three times a day (TID) | ORAL | 0 refills | Status: DC | PRN

## 2022-10-20 MED ORDER — INSULIN LISPRO (1 UNIT DIAL) 100 UNIT/ML (KWIKPEN): PEN_INJECTOR | SUBCUTANEOUS | 5 refills | Status: DC

## 2022-10-20 MED ORDER — BUDESONIDE-FORMOTEROL FUMARATE 160-4.5 MCG/ACT IN AERO: 2.0000 | INHALATION_SPRAY | Freq: Two times a day (BID) | RESPIRATORY_TRACT | 4 refills | Status: DC

## 2022-10-20 NOTE — Progress Notes (Signed)
BP 139/78   Pulse 76   Temp 97.7 F (36.5 C) (Oral)   Ht 5' 4.02" (1.626 m)   Wt 176 lb 0.6 oz (79.9 kg)   LMP  (LMP Unknown)   SpO2 98%   BMI 30.20 kg/m    Subjective:    Patient ID: Mary Parks, female    DOB: Oct 03, 1950, 72 y.o.   MRN: [id number]  HPI: Mary Parks is a 72 y.o. female  Chief Complaint  Patient presents with   Diabetes   Hypertension   Hyperlipidemia   Chronic Kidney Disease   Gastroesophageal Reflux   DIABETES Follows with Dr. Dario Guardian, last saw 1 week ago.  November 2023 A1c was 8.9%.  Endo records not available, have requested multiple times. Currently taking Trulicity 3 MG weekly (increased by nephrology), Lantus (40 units morning and 40 units at night) & Humalog 10 in morning, 10 lunch, and 6 in evening + Farxiga 10 MG. Review of chart over past years  A1c has remained in 8-9 range.  Wearing Dexcom. Hypoglycemic episodes: none Polydipsia/polyuria: no Visual disturbance: no Chest pain: no Paresthesias: no Glucose Monitoring: yes             Accucheck frequency: BID Dexcom -- average glucose over past 90 days has been 184.  Time in range 52%, High 31%, Very High 15%, Low 1% over past 90 days.             Fasting glucose:              Post prandial:             Evening:              Before meals: Taking Insulin?: yes             Long acting insulin: 40 units morning and 40 units at night             Short acting insulin: 10 in morning, 10 lunch, and 6 in evening  Blood Pressure Monitoring: a few times a week Retinal Examination: Up To Date -- [facility] last Friday, sent fax for records Foot Exam: Up to Date Pneumovax: Up to Date Influenza: Up to Date Aspirin: yes  GERD Continues Protonix daily, has tried without and symptoms return. GERD control status: controlled Satisfied with current treatment? yes Heartburn frequency: occasional Medication side effects: no  Medication compliance: stable Dysphagia: no Odynophagia:   no Hematemesis: no Blood in stool: no EGD: no   ASTHMA Continues on Symbicort and Albuterol + Singulair. Asthma status: stable Satisfied with current treatment?: yes Albuterol/rescue inhaler frequency: rarely Dyspnea frequency: none Wheezing frequency: none Cough frequency: none Nocturnal symptom frequency: none Limitation of activity: no Current upper respiratory symptoms: no Aerochamber/spacer use: no Visits to ER or Urgent Care in past year: no Pneumovax: Up to Date Influenza: Up to Date   HYPOTHYROIDISM Followed by endocrinology, taking 88 MCG Levothyroxine.  Unable to view note in Epic.    Saw rheumatology for initial visit on 07/28/22 for ANA positive, to return in September.  Watchful waiting. Thyroid control status:stable Satisfied with current treatment? yes Medication side effects: no Medication compliance: good compliance Etiology of hypothyroidism:  Recent dose adjustment:no Fatigue: no Cold intolerance: no Heat intolerance: no Weight gain: no Weight loss: no Constipation: no Diarrhea/loose stools: no Palpitations: no Lower extremity edema: no Anxiety/depressed mood: no   CHRONIC KIDNEY DISEASE Nephrology at [facility], last saw in 08/26/22.  CRT 1.29 and eGFR 44  at their visit. CKD status: stable Medications renally dose: yes Previous renal evaluation: yes Pneumovax:  Up to Date Influenza Vaccine:  Up to Date  HYPERTENSION / HYPERLIPIDEMIA Current medications include Metoprolol, Lisinopril + Crestor for HLD.   Satisfied with current treatment? yes Duration of hypertension: chronic BP monitoring frequency: occasional BP range: 120/80 range BP medication side effects: no Duration of hyperlipidemia: chronic Cholesterol medication side effects: no Cholesterol supplements: none Medication compliance: good compliance Aspirin: yes Recent stressors: no Recurrent headaches: no Visual changes: no Palpitations: no Dyspnea: no Chest pain: no Lower extremity  edema: no Dizzy/lightheaded: continues to get occasional vertigo with sinus issues  Relevant past medical, surgical, family and social history reviewed and updated as indicated. Interim medical history since our last visit reviewed. Allergies and medications reviewed and updated.  Review of Systems  Constitutional:  Negative for activity change, appetite change, diaphoresis, fatigue and fever.  Respiratory:  Negative for cough, chest tightness and shortness of breath.   Cardiovascular:  Negative for chest pain, palpitations and leg swelling.  Gastrointestinal: Negative.   Endocrine: Negative for cold intolerance, heat intolerance, polydipsia, polyphagia and polyuria.  Neurological: Negative.   Psychiatric/Behavioral: Negative.     Per HPI unless specifically indicated above     Objective:    BP 139/78   Pulse 76   Temp 97.7 F (36.5 C) (Oral)   Ht 5' 4.02" (1.626 m)   Wt 176 lb 0.6 oz (79.9 kg)   LMP  (LMP Unknown)   SpO2 98%   BMI 30.20 kg/m   Wt Readings from Last 3 Encounters:  10/20/22 176 lb 0.6 oz (79.9 kg)  04/19/22 176 lb (79.8 kg)  01/27/22 174 lb 2.6 oz (79 kg)    Physical Exam Vitals and nursing note reviewed.  Constitutional:      General: She is awake. She is not in acute distress.    Appearance: She is well-developed and well-groomed. She is obese. She is not ill-appearing.  HENT:     Head: Normocephalic.     Right Ear: Hearing and external ear normal. No drainage.     Left Ear: Hearing and external ear normal. No drainage.  Eyes:     General: Lids are normal.        Right eye: No discharge.        Left eye: No discharge.     Conjunctiva/sclera: Conjunctivae normal.     Pupils: Pupils are equal, round, and reactive to light.  Neck:     Vascular: No carotid bruit.  Cardiovascular:     Rate and Rhythm: Normal rate and regular rhythm.     Heart sounds: Normal heart sounds.  Pulmonary:     Effort: Pulmonary effort is normal. No accessory muscle usage  or respiratory distress.     Breath sounds: Normal breath sounds.  Abdominal:     General: Bowel sounds are normal. There is no distension.     Palpations: Abdomen is soft.     Tenderness: There is no abdominal tenderness.  Musculoskeletal:     Cervical back: Normal range of motion and neck supple.     Right lower leg: No edema.     Left lower leg: No edema.  Lymphadenopathy:     Cervical: No cervical adenopathy.  Skin:    General: Skin is warm and dry.  Neurological:     Mental Status: She is alert and oriented to person, place, and time.     Motor: Motor function is intact.  Coordination: Coordination is intact.     Gait: Gait is intact.     Deep Tendon Reflexes:     Reflex Scores:      Brachioradialis reflexes are 2+ on the right side and 2+ on the left side.      Patellar reflexes are 2+ on the right side and 2+ on the left side. Psychiatric:        Attention and Perception: Attention normal.        Mood and Affect: Mood normal.        Speech: Speech normal.        Behavior: Behavior normal. Behavior is cooperative.        Thought Content: Thought content normal.    Diabetic Foot Exam - Simple   Simple Foot Form Visual Inspection No deformities, no ulcerations, no other skin breakdown bilaterally: Yes Sensation Testing Intact to touch and monofilament testing bilaterally: Yes Pulse Check Posterior Tibialis and Dorsalis pulse intact bilaterally: Yes Comments     Results for orders placed or performed in visit on 04/19/22  Bayer DCA Hb A1c Waived  Result Value Ref Range   HB A1C (BAYER DCA - WAIVED) 8.9 (H) 4.8 - 5.6 %  Lipid Panel w/o Chol/HDL Ratio  Result Value Ref Range   Cholesterol, Total 146 100 - 199 mg/dL   Triglycerides 88 0 - 149 mg/dL   HDL 44 >16 mg/dL   VLDL Cholesterol Cal 17 5 - 40 mg/dL   LDL Chol Calc (NIH) 85 0 - 99 mg/dL  CBC with Differential/Platelet  Result Value Ref Range   WBC 3.5 3.4 - 10.8 x10E3/uL   RBC 4.45 3.77 - 5.28 x10E6/uL    Hemoglobin 12.0 11.1 - 15.9 g/dL   Hematocrit 10.9 60.4 - 46.6 %   MCV 84 79 - 97 fL   MCH 27.0 26.6 - 33.0 pg   MCHC 32.2 31.5 - 35.7 g/dL   RDW 54.0 98.1 - 19.1 %   Platelets 164 150 - 450 x10E3/uL   Neutrophils 57 Not Estab. %   Lymphs 30 Not Estab. %   Monocytes 8 Not Estab. %   Eos 4 Not Estab. %   Basos 1 Not Estab. %   Neutrophils Absolute 2.0 1.4 - 7.0 x10E3/uL   Lymphocytes Absolute 1.0 0.7 - 3.1 x10E3/uL   Monocytes Absolute 0.3 0.1 - 0.9 x10E3/uL   EOS (ABSOLUTE) 0.1 0.0 - 0.4 x10E3/uL   Basophils Absolute 0.0 0.0 - 0.2 x10E3/uL   Immature Granulocytes 0 Not Estab. %   Immature Grans (Abs) 0.0 0.0 - 0.1 x10E3/uL      Assessment & Plan:   Problem List Items Addressed This Visit       Cardiovascular and Mediastinum   Hypertension associated with chronic kidney disease due to type 2 diabetes mellitus (HCC)    Chronic, stable. BP at goal for age today.  Continue current medication regimen, Lisinopril for kidney protection with diabetes, and collaboration with [facility] nephrology.  Recommend she monitor BP at home at least 3 mornings a week.  LABS: CMP and urine ALB.  Urine ALB 150 May 2024.  Focus on DASH diet at home.  Return in 6 months.      Relevant Medications   Dulaglutide (TRULICITY) 3 MG/0.5ML SOPN   insulin lispro (HUMALOG) 100 UNIT/ML KwikPen   Other Relevant Orders   Bayer DCA Hb A1c Waived   Microalbumin, Urine Waived   Comprehensive metabolic panel     Respiratory   Asthma  Chronic, stable on current medication regimen, with minimal use of Albuterol.  Consider spirometry in future.      Relevant Medications   budesonide-formoterol (SYMBICORT) 160-4.5 MCG/ACT inhaler     Digestive   GERD (gastroesophageal reflux disease)    Chronic and ongoing issue.  Continue Protonix and adjust as needed.  Plan on checking Mag level annually.  If worsening or ongoing consider referral to GI. Risks of PPI use were discussed with patient including bone loss, C. Diff  diarrhea, pneumonia, infections, CKD, electrolyte abnormalities.  Verbalizes understanding and chooses to continue the medication.       Relevant Medications   meclizine (ANTIVERT) 12.5 MG tablet   Other Relevant Orders   Magnesium     Endocrine   Diabetes mellitus with chronic kidney disease (HCC) - Primary    Chronic, ongoing, followed by endocrinology.  A1c 7.8% today, recommend continued focus on diet changes and collaboration with endocrinology.  Urine ALB 150 May 2024, continue ACE for kidney protection.  Continue current medication regimen and collaboration with endocrinology and nephrology.  Will work on obtaining endocrinology and eye doctor notes. - On Statin and ACE - Vaccinations up to date with exception of Shingrix - Eye and Foot exams up to date.      Relevant Medications   Dulaglutide (TRULICITY) 3 MG/0.5ML SOPN   insulin lispro (HUMALOG) 100 UNIT/ML KwikPen   Other Relevant Orders   Bayer DCA Hb A1c Waived   Microalbumin, Urine Waived   Comprehensive metabolic panel   Hyperlipidemia associated with type 2 diabetes mellitus (HCC)    Chronic, ongoing.  Continue current medication regimen and adjust as needed.  Lipid panel today.      Relevant Medications   Dulaglutide (TRULICITY) 3 MG/0.5ML SOPN   insulin lispro (HUMALOG) 100 UNIT/ML KwikPen   Other Relevant Orders   Bayer DCA Hb A1c Waived   Comprehensive metabolic panel   Lipid Panel w/o Chol/HDL Ratio   Hypothyroidism    S/P thyroidectomy years ago.  Followed by Dr. Dario Guardian with endo.  Continue current medication regimen and adjust as needed.  Will work on getting endocrinology notes.  Thyroid labs today.       Relevant Orders   T4, free   TSH   Type 2 diabetes mellitus with diabetic neuropathy, with long-term current use of insulin (HCC)    Chronic, ongoing, followed by endocrinology.  A1c 7.8% today, recommend continued focus on diet changes and collaboration with endocrinology.  Urine ALB 150 May 2024,  continue ACE for kidney protection.  Continue current medication regimen and collaboration with endocrinology and nephrology.  Will work on obtaining endocrinology and eye doctor notes. - On Statin and ACE - Vaccinations up to date with exception of Shingrix - Eye and Foot exams up to date.      Relevant Medications   Dulaglutide (TRULICITY) 3 MG/0.5ML SOPN   insulin lispro (HUMALOG) 100 UNIT/ML KwikPen   Other Relevant Orders   Bayer DCA Hb A1c Waived   Microalbumin, Urine Waived   Comprehensive metabolic panel     Genitourinary   Chronic kidney disease, stage 3a (HCC)    Chronic, ongoing.  Continue collaboration with [facility] nephrology, recent notes reviewed.  Lisinopril for kidney protection. CMP and urine ALB today.  Urine ALB 150 May 2024.      Relevant Orders   Microalbumin, Urine Waived     Other   Obesity    BMI 30.20.  Recommended eating smaller high protein, low fat meals more  frequently and exercising 30 mins a day 5 times a week with a goal of 10-15lb weight loss in the next 3 months. Patient voiced their understanding and motivation to adhere to these recommendations.       Relevant Medications   Dulaglutide (TRULICITY) 3 MG/0.5ML SOPN   insulin lispro (HUMALOG) 100 UNIT/ML KwikPen   Positive ANA (antinuclear antibody)    Noted past labs after complaints of multiple joint pain.  Watchful waiting at this time.  Discussed at length with patient and re educated on reason for referral to rheumatology, she reports better understanding now.  Continue this collaboration, recent notes reviewed.          Follow up plan: Return in about 6 months (around 04/22/2023) for T2DM, HTN/HLD, CKD, ASTHMA.

## 2022-10-20 NOTE — Assessment & Plan Note (Signed)
Chronic, stable on current medication regimen, with minimal use of Albuterol.  Consider spirometry in future. °

## 2022-10-20 NOTE — Assessment & Plan Note (Signed)
Chronic, ongoing.  Continue current medication regimen and adjust as needed. Lipid panel today. 

## 2022-10-20 NOTE — Assessment & Plan Note (Signed)
S/P thyroidectomy years ago.  Followed by Dr. Dario Guardian with endo.  Continue current medication regimen and adjust as needed.  Will work on getting endocrinology notes.  Thyroid labs today.

## 2022-10-20 NOTE — Assessment & Plan Note (Signed)
Chronic, ongoing, followed by endocrinology.  A1c 7.8% today, recommend continued focus on diet changes and collaboration with endocrinology.  Urine ALB 150 May 2024, continue ACE for kidney protection.  Continue current medication regimen and collaboration with endocrinology and nephrology.  Will work on obtaining endocrinology and eye doctor notes. - On Statin and ACE - Vaccinations up to date with exception of Shingrix - Eye and Foot exams up to date. 

## 2022-10-20 NOTE — Assessment & Plan Note (Signed)
Chronic, ongoing.  Continue collaboration with [facility] nephrology, recent notes reviewed.  Lisinopril for kidney protection. CMP and urine ALB today.  Urine ALB 150 May 2024.

## 2022-10-20 NOTE — Assessment & Plan Note (Signed)
BMI 30.20.  Recommended eating smaller high protein, low fat meals more frequently and exercising 30 mins a day 5 times a week with a goal of 10-15lb weight loss in the next 3 months. Patient voiced their understanding and motivation to adhere to these recommendations.  

## 2022-10-20 NOTE — Assessment & Plan Note (Signed)
Chronic, ongoing, followed by endocrinology.  A1c 7.8% today, recommend continued focus on diet changes and collaboration with endocrinology.  Urine ALB 150 May 2024, continue ACE for kidney protection.  Continue current medication regimen and collaboration with endocrinology and nephrology.  Will work on obtaining endocrinology and eye doctor notes. - On Statin and ACE - Vaccinations up to date with exception of Shingrix - Eye and Foot exams up to date.

## 2022-10-20 NOTE — Assessment & Plan Note (Signed)
Chronic and ongoing issue.  Continue Protonix and adjust as needed.  Plan on checking Mag level annually.  If worsening or ongoing consider referral to GI. Risks of PPI use were discussed with patient including bone loss, C. Diff diarrhea, pneumonia, infections, CKD, electrolyte abnormalities.  Verbalizes understanding and chooses to continue the medication.  

## 2022-10-20 NOTE — Assessment & Plan Note (Signed)
Noted past labs after complaints of multiple joint pain.  Watchful waiting at this time.  Discussed at length with patient and re educated on reason for referral to rheumatology, she reports better understanding now.  Continue this collaboration, recent notes reviewed.

## 2022-10-20 NOTE — Assessment & Plan Note (Signed)
Chronic, stable. BP at goal for age today.  Continue current medication regimen, Lisinopril for kidney protection with diabetes, and collaboration with [facility] nephrology.  Recommend she monitor BP at home at least 3 mornings a week.  LABS: CMP and urine ALB.  Urine ALB 150 May 2024.  Focus on DASH diet at home.  Return in 6 months.

## 2022-10-21 LAB — COMPREHENSIVE METABOLIC PANEL
ALT: 33 IU/L — ABNORMAL HIGH (ref 0–32)
AST: 30 IU/L (ref 0–40)
Albumin/Globulin Ratio: 1.4 (ref 1.2–2.2)
Albumin: 4 g/dL (ref 3.8–4.8)
Alkaline Phosphatase: 89 IU/L (ref 44–121)
BUN/Creatinine Ratio: 12 (ref 12–28)
BUN: 16 mg/dL (ref 8–27)
Bilirubin Total: 0.3 mg/dL (ref 0.0–1.2)
CO2: 25 mmol/L (ref 20–29)
Calcium: 9.6 mg/dL (ref 8.7–10.3)
Chloride: 108 mmol/L — ABNORMAL HIGH (ref 96–106)
Creatinine, Ser: 1.31 mg/dL — ABNORMAL HIGH (ref 0.57–1.00)
Globulin, Total: 2.8 g/dL (ref 1.5–4.5)
Glucose: 151 mg/dL — ABNORMAL HIGH (ref 70–99)
Potassium: 4.3 mmol/L (ref 3.5–5.2)
Sodium: 146 mmol/L — ABNORMAL HIGH (ref 134–144)
Total Protein: 6.8 g/dL (ref 6.0–8.5)
eGFR: 43 mL/min/{1.73_m2} — ABNORMAL LOW (ref >59)

## 2022-10-21 LAB — LIPID PANEL W/O CHOL/HDL RATIO
Cholesterol, Total: 153 mg/dL (ref 100–199)
HDL: 50 mg/dL (ref >39)
LDL Chol Calc (NIH): 86 mg/dL (ref 0–99)
Triglycerides: 91 mg/dL (ref 0–149)
VLDL Cholesterol Cal: 17 mg/dL (ref 5–40)

## 2022-10-21 LAB — T4, FREE: Free T4: 1.35 ng/dL (ref 0.82–1.77)

## 2022-10-21 LAB — TSH: TSH: 1.5 u[IU]/mL (ref 0.450–4.500)

## 2022-10-21 LAB — MAGNESIUM: Magnesium: 2.2 mg/dL (ref 1.6–2.3)

## 2022-10-21 NOTE — Progress Notes (Signed)
Good afternoon, please let Mary Parks know all her labs have returned: - Kidney function continues to show stage 3a to b kidney disease with no worsening, we will continue to monitor with kidney doctor.  Salt level a little high, reduce your salt intake and increase water. - Remainder of labs stable, no medication changes needed.  Any questions? Keep being amazing!!  Thank you for allowing me to participate in your care.  I appreciate you. Kindest regards, River Ambrosio

## 2022-10-22 ENCOUNTER — Telehealth: Payer: Self-pay

## 2022-10-22 ENCOUNTER — Encounter: Payer: Self-pay | Admitting: Nurse Practitioner

## 2022-10-22 MED ORDER — AMOXICILLIN-POT CLAVULANATE 875-125 MG PO TABS: 1.0000 | ORAL_TABLET | Freq: Two times a day (BID) | ORAL | 0 refills | Status: AC

## 2022-10-22 NOTE — Addendum Note (Signed)
Addended by: Aura Dials T on: 10/22/2022 03:21 PM   Modules accepted: Orders

## 2022-10-22 NOTE — Telephone Encounter (Signed)
Pt is calling back to see if her PCP is going to call in an antibiotic for her ear pain. Pt states that she has vertigo and sinus problems and her ear pain is bad. Pt would like a call back to discuss.

## 2022-10-22 NOTE — Telephone Encounter (Signed)
Called patient with lab results and patient wanted to know if she could get an abx sent in for her ears, she stated that they are hurting and mentioned this at her appt.

## 2022-10-28 DIAGNOSIS — E1165 Type 2 diabetes mellitus with hyperglycemia: Secondary | ICD-10-CM | POA: Diagnosis not present

## 2022-12-16 DIAGNOSIS — N179 Acute kidney failure, unspecified: Secondary | ICD-10-CM | POA: Diagnosis not present

## 2022-12-16 DIAGNOSIS — R809 Proteinuria, unspecified: Secondary | ICD-10-CM | POA: Diagnosis not present

## 2022-12-16 DIAGNOSIS — I10 Essential (primary) hypertension: Secondary | ICD-10-CM | POA: Diagnosis not present

## 2022-12-16 DIAGNOSIS — E1129 Type 2 diabetes mellitus with other diabetic kidney complication: Secondary | ICD-10-CM | POA: Diagnosis not present

## 2022-12-16 DIAGNOSIS — Z794 Long term (current) use of insulin: Secondary | ICD-10-CM | POA: Diagnosis not present

## 2022-12-19 ENCOUNTER — Other Ambulatory Visit: Payer: Self-pay | Admitting: Nurse Practitioner

## 2022-12-21 NOTE — Telephone Encounter (Signed)
Requested Prescriptions  Pending Prescriptions Disp Refills   albuterol (VENTOLIN HFA) 108 (90 Base) MCG/ACT inhaler [Pharmacy Med Name: Albuterol Sulfate HFA 108 (90 Base) MCG/ACT Inhalation Aerosol Solution] 18 g 2    Sig: INHALE 2 PUFFS BY MOUTH EVERY 6 HOURS AS NEEDED FOR WHEEZING AND  OR  SHORTNESS  OF  BREATH     Pulmonology:  Beta Agonists 2 Passed - 12/19/2022  7:51 AM      Passed - Last BP in normal range    BP Readings from Last 1 Encounters:  10/20/22 139/78         Passed - Last Heart Rate in normal range    Pulse Readings from Last 1 Encounters:  10/20/22 76         Passed - Valid encounter within last 12 months    Recent Outpatient Visits           2 months ago 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, with long-term current use of insulin (HCC)   [place] [place], [place] T, NP   8 months ago Type 2 diabetes mellitus with diabetic neuropathy, with long-term current use of insulin (HCC)   [place] [facility] [place], [place] T, NP   1 year ago Type 2 diabetes mellitus with diabetic neuropathy, with long-term current use of insulin (HCC)   [place] [facility] [place], Cowden T, NP   1 year ago 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

## 2022-12-23 DIAGNOSIS — N1832 Chronic kidney disease, stage 3b: Secondary | ICD-10-CM | POA: Diagnosis not present

## 2022-12-23 DIAGNOSIS — R809 Proteinuria, unspecified: Secondary | ICD-10-CM | POA: Diagnosis not present

## 2022-12-23 DIAGNOSIS — E1129 Type 2 diabetes mellitus with other diabetic kidney complication: Secondary | ICD-10-CM | POA: Diagnosis not present

## 2022-12-23 DIAGNOSIS — Z794 Long term (current) use of insulin: Secondary | ICD-10-CM | POA: Diagnosis not present

## 2023-01-04 ENCOUNTER — Ambulatory Visit (INDEPENDENT_AMBULATORY_CARE_PROVIDER_SITE_OTHER): Payer: 59 | Admitting: Emergency Medicine

## 2023-01-04 VITALS — Ht 64.0 in | Wt 175.0 lb

## 2023-01-04 DIAGNOSIS — Z0000 Encounter for general adult medical examination without abnormal findings: Secondary | ICD-10-CM

## 2023-01-04 DIAGNOSIS — Z780 Asymptomatic menopausal state: Secondary | ICD-10-CM

## 2023-01-04 NOTE — Patient Instructions (Addendum)
[name] , Thank you for taking time to come for your Medicare Wellness Visit. I appreciate your ongoing commitment to your health goals. Please review the following plan we discussed and let me know if I can assist you in the future.   Referrals/Orders/Follow-Ups/Clinician Recommendations: I could not find a DEXA scan in your chart since 2011. I have placed an order for one. If you find out when and where this was done previously, please let the office know.  This is a list of the screening recommended for you and due dates:  Health Maintenance  Topic Date Due   Flu Shot  12/23/2022   COVID-19 Vaccine (7 - 2023-24 season) 01/20/2023*   Hemoglobin A1C  04/22/2023   Mammogram  10/13/2023   Eye exam for diabetics  10/15/2023   Complete foot exam   10/19/2023   Yearly kidney function blood test for diabetes  10/20/2023   Yearly kidney health urinalysis for diabetes  10/20/2023   Medicare Annual Wellness Visit  01/04/2024   Colon Cancer Screening  06/15/2026   DTaP/Tdap/Td vaccine (3 - Tdap) 01/10/2030   Pneumonia Vaccine  Completed   Hepatitis C Screening  Completed   DEXA scan (bone density measurement)  Addressed   HPV Vaccine  Aged Out   Zoster (Shingles) Vaccine  Discontinued  *Topic was postponed. The date shown is not the original due date.    Advanced directives: (ACP Link)Information on Advanced Care Planning can be found at [facility] Directives Advance Health Care Directives ([email or web address]   Next Medicare Annual Wellness Visit scheduled for next year: Yes, 01/10/24 @ 8:15am  Preventive Care 65 Years and Older, Female Preventive care refers to lifestyle choices and visits with your health care provider that can promote health and wellness. What does preventive care include? A yearly physical exam. This is also called an annual well check. Dental exams once or twice a year. Routine eye exams. Ask your health care provider how often you  should have your eyes checked. Personal lifestyle choices, including: Daily care of your teeth and gums. Regular physical activity. Eating a healthy diet. Avoiding tobacco and drug use. Limiting alcohol use. Practicing safe sex. Taking low-dose aspirin every day. Taking vitamin and mineral supplements as recommended by your health care provider. What happens during an annual well check? The services and screenings done by your health care provider during your annual well check will depend on your age, overall health, lifestyle risk factors, and family history of disease. Counseling  Your health care provider may ask you questions about your: Alcohol use. Tobacco use. Drug use. Emotional well-being. Home and relationship well-being. Sexual activity. Eating habits. History of falls. Memory and ability to understand (cognition). Work and work Astronomer. Reproductive health. Screening  You may have the following tests or measurements: Height, weight, and BMI. Blood pressure. Lipid and cholesterol levels. These may be checked every 5 years, or more frequently if you are over 41 years old. Skin check. Lung cancer screening. You may have this screening every year starting at age 20 if you have a 30-pack-year history of smoking and currently smoke or have quit within the past 15 years. Fecal occult blood test (FOBT) of the stool. You may have this test every year starting at age 32. Flexible sigmoidoscopy or colonoscopy. You may have a sigmoidoscopy every 5 years or a colonoscopy every 10 years starting at age 40. Hepatitis C blood test. Hepatitis B blood test. Sexually transmitted disease (  STD) testing. Diabetes screening. This is done by checking your blood sugar (glucose) after you have not eaten for a while (fasting). You may have this done every 1-3 years. Bone density scan. This is done to screen for osteoporosis. You may have this done starting at age 83. Mammogram. This may  be done every 1-2 years. Talk to your health care provider about how often you should have regular mammograms. Talk with your health care provider about your test results, treatment options, and if necessary, the need for more tests. Vaccines  Your health care provider may recommend certain vaccines, such as: Influenza vaccine. This is recommended every year. Tetanus, diphtheria, and acellular pertussis (Tdap, Td) vaccine. You may need a Td booster every 10 years. Zoster vaccine. You may need this after age 79. Pneumococcal 13-valent conjugate (PCV13) vaccine. One dose is recommended after age 65. Pneumococcal polysaccharide (PPSV23) vaccine. One dose is recommended after age 9. Talk to your health care provider about which screenings and vaccines you need and how often you need them. This information is not intended to replace advice given to you by your health care provider. Make sure you discuss any questions you have with your health care provider. Document Released: 06/06/2015 Document Revised: 01/28/2016 Document Reviewed: 03/11/2015 Elsevier Interactive Patient Education  2017 [employer].  Fall Prevention in the Home Falls can cause injuries. They can happen to people of all ages. There are many things you can do to make your home safe and to help prevent falls. What can I do on the outside of my home? Regularly fix the edges of walkways and driveways and fix any cracks. Remove anything that might make you trip as you walk through a door, such as a raised step or threshold. Trim any bushes or trees on the path to your home. Use bright outdoor lighting. Clear any walking paths of anything that might make someone trip, such as rocks or tools. Regularly check to see if handrails are loose or broken. Make sure that both sides of any steps have handrails. Any raised decks and porches should have guardrails on the edges. Have any leaves, snow, or ice cleared regularly. Use sand or salt  on walking paths during winter. Clean up any spills in your garage right away. This includes oil or grease spills. What can I do in the bathroom? Use night lights. Install grab bars by the toilet and in the tub and shower. Do not use towel bars as grab bars. Use non-skid mats or decals in the tub or shower. If you need to sit down in the shower, use a plastic, non-slip stool. Keep the floor dry. Clean up any water that spills on the floor as soon as it happens. Remove soap buildup in the tub or shower regularly. Attach bath mats securely with double-sided non-slip rug tape. Do not have throw rugs and other things on the floor that can make you trip. What can I do in the bedroom? Use night lights. Make sure that you have a light by your bed that is easy to reach. Do not use any sheets or blankets that are too big for your bed. They should not hang down onto the floor. Have a firm chair that has side arms. You can use this for support while you get dressed. Do not have throw rugs and other things on the floor that can make you trip. What can I do in the kitchen? Clean up any spills right away. Avoid walking on  wet floors. Keep items that you use a lot in easy-to-reach places. If you need to reach something above you, use a strong step stool that has a grab bar. Keep electrical cords out of the way. Do not use floor polish or wax that makes floors slippery. If you must use wax, use non-skid floor wax. Do not have throw rugs and other things on the floor that can make you trip. What can I do with my stairs? Do not leave any items on the stairs. Make sure that there are handrails on both sides of the stairs and use them. Fix handrails that are broken or loose. Make sure that handrails are as long as the stairways. Check any carpeting to make sure that it is firmly attached to the stairs. Fix any carpet that is loose or worn. Avoid having throw rugs at the top or bottom of the stairs. If you  do have throw rugs, attach them to the floor with carpet tape. Make sure that you have a light switch at the top of the stairs and the bottom of the stairs. If you do not have them, ask someone to add them for you. What else can I do to help prevent falls? Wear shoes that: Do not have high heels. Have rubber bottoms. Are comfortable and fit you well. Are closed at the toe. Do not wear sandals. If you use a stepladder: Make sure that it is fully opened. Do not climb a closed stepladder. Make sure that both sides of the stepladder are locked into place. Ask someone to hold it for you, if possible. Clearly mark and make sure that you can see: Any grab bars or handrails. First and last steps. Where the edge of each step is. Use tools that help you move around (mobility aids) if they are needed. These include: Canes. Walkers. Scooters. Crutches. Turn on the lights when you go into a dark area. Replace any light bulbs as soon as they burn out. Set up your furniture so you have a clear path. Avoid moving your furniture around. If any of your floors are uneven, fix them. If there are any pets around you, be aware of where they are. Review your medicines with your doctor. Some medicines can make you feel dizzy. This can increase your chance of falling. Ask your doctor what other things that you can do to help prevent falls. This information is not intended to replace advice given to you by your health care provider. Make sure you discuss any questions you have with your health care provider. Document Released: 03/06/2009 Document Revised: 10/16/2015 Document Reviewed: 06/14/2014 Elsevier Interactive Patient Education  2017 [employer].

## 2023-01-04 NOTE — Progress Notes (Addendum)
Subjective:   Mary Parks is a 72 y.o. female who presents for Medicare Annual (Subsequent) preventive examination.  Visit Complete: Virtual  I connected with  Mary Parks on 01/04/23 by a audio enabled telemedicine application and verified that I am speaking with the correct person using two identifiers.  Patient Location: Home  Provider Location: Office/Clinic  I discussed the limitations of evaluation and management by telemedicine. The patient expressed understanding and agreed to proceed.  Vital Signs: Unable to obtain new vitals due to this being a telehealth visit.   Review of Systems     Cardiac Risk Factors include: advanced age (>42men, >86 women);diabetes mellitus;dyslipidemia;hypertension;obesity (BMI >30kg/m2)     Objective:    Today's Vitals   01/04/23 0824  Weight: 175 lb (79.4 kg)  Height: 5\' 4"  (1.626 m)   Body mass index is 30.04 kg/m.     01/04/2023    8:33 AM 01/27/2022   10:34 AM 12/30/2021   11:38 AM 07/20/2021   12:43 PM 06/15/2021    6:57 AM 12/29/2020   11:17 AM 11/17/2020    8:59 AM  Advanced Directives  Does Patient Have a Medical Advance Directive? No No No No No No No  Would patient like information on creating a medical advance directive? Yes (MAU/Ambulatory/Procedural Areas - Information given)  No - Patient declined  Yes (Inpatient - patient defers creating a medical advance directive and declines information at this time)      Current Medications (verified) Outpatient Encounter Medications as of 01/04/2023  Medication Sig   albuterol (VENTOLIN HFA) 108 (90 Base) MCG/ACT inhaler INHALE 2 PUFFS BY MOUTH EVERY 6 HOURS AS NEEDED FOR WHEEZING AND  OR  SHORTNESS  OF  BREATH   aspirin EC 81 MG tablet Take 81 mg by mouth daily.   budesonide-formoterol (SYMBICORT) 160-4.5 MCG/ACT inhaler Inhale 2 puffs into the lungs 2 (two) times daily.   calcium-vitamin D (OSCAL WITH D) 500-200 MG-UNIT tablet Take 1 tablet by mouth.   Dulaglutide  (TRULICITY) 3 MG/0.5ML SOPN Inject 3 mg into the skin once a week.   FARXIGA 10 MG TABS tablet Take 1 tablet (10 mg total) by mouth daily.   fluticasone (FLONASE) 50 MCG/ACT nasal spray Use 2 spray(s) in each nostril once daily   insulin lispro (HUMALOG) 100 UNIT/ML KwikPen INJECT 10 UNITS SUBCTUANEOUSLY BEFORE BREAKFAST AND LUNCH, AND 6 UNITS BEFORE EVENING MEAL.   LANTUS SOLOSTAR 100 UNIT/ML Solostar Pen INJECT 45 UNITS SUBCUTANEOUSLY IN THE MORNING AND 40 UNITS AT NIGHT   LASTACAFT 0.25 % SOLN Apply 1 drop to eye daily.   levothyroxine (SYNTHROID) 88 MCG tablet Take 1 tablet (88 mcg total) by mouth every morning.   lisinopril (ZESTRIL) 30 MG tablet Take 1 tablet (30 mg total) by mouth daily.   meclizine (ANTIVERT) 12.5 MG tablet Take 1 tablet (12.5 mg total) by mouth 3 (three) times daily as needed for dizziness.   metoprolol tartrate (LOPRESSOR) 25 MG tablet Take 1 tablet (25 mg total) by mouth 2 (two) times daily.   montelukast (SINGULAIR) 10 MG tablet Take 1 tablet (10 mg total) by mouth at bedtime.   ONE TOUCH ULTRA TEST test strip 1 each by Other route 2 (two) times daily.    ONETOUCH DELICA LANCETS 33G MISC 2 (two) times daily.    pantoprazole (PROTONIX) 40 MG tablet Take 1 tablet (40 mg total) by mouth daily.   rosuvastatin (CRESTOR) 40 MG tablet Take 1 tablet (40 mg total) by mouth at  bedtime.   No facility-administered encounter medications on file as of 01/04/2023.    Allergies (verified) Celebrex [celecoxib], Sulfa antibiotics, Elemental sulfur, and Sulfisoxazole   History: Past Medical History:  Diagnosis Date   Asthma    Chest pain    DM2 (diabetes mellitus, type 2) (HCC) 1987   Dyspnea    Cath normal coronaries. normal EF. RA 11. 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/14/15    Vertigo    none recently   Past Surgical History:  Procedure Laterality Date   CARDIAC CATHETERIZATION     CATARACT EXTRACTION W/PHACO Right 03/31/2015   Procedure: CATARACT EXTRACTION PHACO AND INTRAOCULAR LENS PLACEMENT (IOC);  Surgeon: Sherald Hess, MD;  Location: [facility];  Service: Ophthalmology;  Laterality: Right;  DIABETIC - insulin   CATARACT EXTRACTION W/PHACO Left 08/31/2021   Procedure: CATARACT EXTRACTION PHACO AND INTRAOCULAR LENS PLACEMENT (IOC) LEFT DIABETIC 7.54 01:00.2;  Surgeon: Nevada Crane, MD;  Location: [facility];  Service: Ophthalmology;  Laterality: Left;  Diabetic   COLONOSCOPY WITH PROPOFOL N/A 11/12/2016   Procedure: COLONOSCOPY WITH PROPOFOL;  Surgeon: Midge Minium, MD;  Location: [facility];  Service: Endoscopy;  Laterality: N/A;  Diabetic - insulin   COLONOSCOPY WITH PROPOFOL N/A 06/15/2021   Procedure: COLONOSCOPY WITH BIOPSY;  Surgeon: Midge Minium, MD;  Location: [facility];  Service: Endoscopy;  Laterality: N/A;  Diabetic   EYE SURGERY     HERNIA REPAIR     umbilical   POLYPECTOMY  11/12/2016   Procedure: POLYPECTOMY INTESTINAL;  Surgeon: Midge Minium, MD;  Location: [facility];  Service: Endoscopy;;   POLYPECTOMY N/A 06/15/2021   Procedure: POLYPECTOMY;  Surgeon: Midge Minium, MD;  Location: [facility];  Service: Endoscopy;  Laterality: N/A;   THYROIDECTOMY N/A 04/14/2015   Procedure: THYROIDECTOMY;  Surgeon: Vernie Murders, MD;  Location: [facility];  Service: ENT;  Laterality: N/A;   Family History  Problem Relation Age of Onset   Diabetes Mother    Diabetes Father    Diabetes Sister    Diabetes Brother    Diabetes Sister    Diabetes Sister    Diabetes Maternal Grandmother    Diabetes Maternal Grandfather    Diabetes Paternal Grandmother    Diabetes Paternal Grandfather    Heart disease Neg Hx    Coronary artery disease Neg Hx    Breast cancer Neg Hx    Social History    Socioeconomic History   Marital status: Widowed    Spouse name: Not on file   Number of children: 2   Years of education: Not on file   Highest education level: Some college, no degree  Occupational History   Occupation:  [employer], 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    Associate Professor: OTHER   Occupation: retired  Tobacco Use   Smoking status: Never   Smokeless tobacco: Never  Vaping Use   Vaping status: Never Used  Substance and Sexual Activity   Alcohol use: No   Drug use: No   Sexual activity: Not Currently  Other Topics Concern   Not on file  Social History Narrative   Not on file   Social Determinants of Health   Financial Resource Strain: Low Risk  (  01/04/2023)   Overall Financial Resource Strain (CARDIA)    Difficulty of Paying Living Expenses: Not hard at all  Food Insecurity: No Food Insecurity (01/04/2023)   Hunger Vital Sign    Worried About Running Out of Food in the Last Year: Never true    Ran Out of Food in the Last Year: Never true  Transportation Needs: No Transportation Needs (01/04/2023)   PRAPARE - Administrator, Civil Service (Medical): No    Lack of Transportation (Non-Medical): No  Physical Activity: Insufficiently Active (01/04/2023)   Exercise Vital Sign    Days of Exercise per Week: 2 days    Minutes of Exercise per Session: 30 min  Stress: No Stress Concern Present (01/04/2023)   [employer] of Occupational Health - Occupational Stress Questionnaire    Feeling of Stress : Not at all  Social Connections: Moderately Isolated (01/04/2023)   Social Connection and Isolation Panel [NHANES]    Frequency of Communication with Friends and Family: More than three times a week    Frequency of Social Gatherings with Friends and Family: More than three times a week    Attends Religious Services: More than 4 times per year    Active Member of [employer] or Organizations: No    Attends Banker Meetings: Never    Marital Status: Widowed     Tobacco Counseling Counseling given: Not Answered   Clinical Intake:  Pre-visit preparation completed: Yes  Pain : No/denies pain     BMI - recorded: 30.04 Nutritional Status: BMI > 30  Obese Nutritional Risks: None Diabetes: Yes CBG done?: No (FBS 121 per patient) Did pt. bring in CBG monitor from home?: No  How often do you need to have someone help you when you read instructions, pamphlets, or other written materials from your doctor or pharmacy?: 1 - Never  Interpreter Needed?: No  Information entered by :: Tora Kindred, CMA   Activities of Daily Living    01/04/2023    8:26 AM  In your present state of health, do you have any difficulty performing the following activities:  Hearing? 0  Vision? 0  Difficulty concentrating or making decisions? 0  Walking or climbing stairs? 0  Dressing or bathing? 0  Doing errands, shopping? 0  Preparing Food and eating ? N  Using the Toilet? N  In the past six months, have you accidently leaked urine? N  Do you have problems with loss of bowel control? N  Managing your Medications? N  Managing your Finances? N  Housekeeping or managing your Housekeeping? N    Patient Care Team: Marjie Skiff, NP as PCP - General (Nurse Practitioner) Kshirsagar, Roni Bread, MD as Referring Physician (Nephrology) Zettie Pho, [facility] (Inactive) (Pharmacist)  Indicate any recent Medical Services you may have received from other than [facility] providers in the past year (date may be approximate).     Assessment:   This is a routine wellness examination for [place].  Hearing/Vision screen Hearing Screening - Comments:: Denies hearing loss  Dietary issues and exercise activities discussed:     Goals Addressed               This Visit's Progress     DIET - INCREASE WATER INTAKE (pt-stated)        Depression Screen    01/04/2023    8:31 AM 10/20/2022   11:02 AM 04/19/2022   10:58 AM 12/30/2021   11:43 AM 10/16/2021   11:32  AM  06/05/2021   11:06 AM 12/29/2020   11:19 AM  PHQ 2/9 Scores  PHQ - 2 Score 0 0 0 0 0 0 0  PHQ- 9 Score 0 0 1  2 1      Fall Risk    01/04/2023    8:33 AM 10/20/2022   11:02 AM 04/19/2022   10:58 AM 12/30/2021   11:36 AM 10/16/2021   11:32 AM  Fall Risk   Falls in the past year? 0 0 0 0 0  Number falls in past yr: 0 0 0 0 0  Injury with Fall? 0 0 0 0 0  Risk for fall due to : No Fall Risks No Fall Risks No Fall Risks  No Fall Risks  Follow up Falls prevention discussed Falls evaluation completed Falls evaluation completed Education provided;Falls evaluation completed;Falls prevention discussed Falls evaluation completed    MEDICARE RISK AT HOME:  Medicare Risk at Home - 01/04/23 4034     Any stairs in or around the home? No    If so, are there any without handrails? No    Home free of loose throw rugs in walkways, pet beds, electrical cords, etc? Yes    Adequate lighting in your home to reduce risk of falls? Yes    Life alert? No    Use of a cane, walker or w/c? No    Grab bars in the bathroom? Yes    Shower chair or bench in shower? Yes    Elevated toilet seat or a handicapped toilet? Yes             TIMED UP AND GO:  Was the test performed?  No    Cognitive Function:        01/04/2023    8:34 AM 12/30/2021   11:38 AM 12/29/2020   11:20 AM 12/28/2019    9:05 AM 12/19/2017    9:55 AM  6CIT Screen  What Year? 0 points 0 points 0 points 0 points 0 points  What month? 0 points 0 points 0 points 0 points 0 points  What time? 0 points 0 points 0 points 0 points 0 points  Count back from 20 0 points 0 points 0 points 0 points 0 points  Months in reverse 0 points 0 points 0 points 0 points 0 points  Repeat phrase 2 points 0 points 0 points 0 points 2 points  Total Score 2 points 0 points 0 points 0 points 2 points    Immunizations Immunization History  Administered Date(s) Administered   Fluad Quad(high Dose 65+) 03/24/2022   Influenza, High Dose Seasonal PF  02/25/2018, 02/15/2019, 02/28/2020, 03/27/2021   Influenza,inj,Quad PF,6+ Mos 03/25/2015   Influenza-Unspecified 02/21/2014, 03/31/2016, 04/01/2017   Moderna SARS-COV2 Booster Vaccination 02/06/2021   PFIZER(Purple Top)SARS-COV-2 Vaccination 07/06/2019, 07/27/2019, 03/13/2020, 10/08/2020, 03/24/2022   Pneumococcal Conjugate-13 09/26/2013   Pneumococcal Polysaccharide-23 03/13/2008, 03/25/2015   Td 03/13/2008, 01/11/2020    TDAP status: Up to date  Flu Vaccine status: Due, Education has been provided regarding the importance of this vaccine. Advised may receive this vaccine at local pharmacy or Health Dept. Aware to provide a copy of the vaccination record if obtained from local pharmacy or Health Dept. Verbalized acceptance and understanding.  Pneumococcal vaccine status: Up to date  Covid-19 vaccine status: Information provided on how to obtain vaccines.   Qualifies for Shingles Vaccine? Yes   Zostavax completed No   Shingrix Completed?: No.    Education has been provided regarding the importance of this vaccine.  Patient has been advised to call insurance company to determine out of pocket expense if they have not yet received this vaccine. Advised may also receive vaccine at local pharmacy or Health Dept. Verbalized acceptance and understanding.  Screening Tests Health Maintenance  Topic Date Due   COVID-19 Vaccine (7 - 2023-24 season) 05/19/2022   INFLUENZA VACCINE  12/23/2022   Zoster Vaccines- Shingrix (1 of 2) 01/20/2023 (Originally 06/20/1998)   HEMOGLOBIN A1C  04/22/2023   OPHTHALMOLOGY EXAM  10/15/2023   FOOT EXAM  10/19/2023   Diabetic kidney evaluation - eGFR measurement  10/20/2023   Diabetic kidney evaluation - Urine ACR  10/20/2023   Medicare Annual Wellness (AWV)  01/04/2024   MAMMOGRAM  10/12/2024   Colonoscopy  06/15/2026   DTaP/Tdap/Td (3 - Tdap) 01/10/2030   Pneumonia Vaccine 53+ Years old  Completed   Hepatitis C Screening  Completed   DEXA SCAN  Addressed    HPV VACCINES  Aged Out    Health Maintenance  Health Maintenance Due  Topic Date Due   COVID-19 Vaccine (7 - 2023-24 season) 05/19/2022   INFLUENZA VACCINE  12/23/2022    Colorectal cancer screening: Type of screening: Colonoscopy. Completed 06/15/21. Repeat every 5 years  Mammogram status: Completed 10/13/22. Repeat every year  Bone Density status: Ordered 01/04/23. Pt provided with contact info and advised to call to schedule appt.  Lung Cancer Screening: (Low Dose CT Chest recommended if Age 80-80 years, 20 pack-year currently smoking OR have quit w/in 15years.) does not qualify.   Lung Cancer Screening Referral: n/a  Additional Screening:  Hepatitis C Screening: does not qualify; Completed 10/03/15  Vision Screening: Recommended annual ophthalmology exams for early detection of glaucoma and other disorders of the eye. Is the patient up to date with their annual eye exam?  Yes  Who is the provider or what is the name of the office in which the patient attends annual eye exams? [facility] If pt is not established with a provider, would they like to be referred to a provider to establish care? No .   Dental Screening: Recommended annual dental exams for proper oral hygiene  Diabetic Foot Exam: Diabetic Foot Exam: Completed 10/19/22  Community Resource Referral / Chronic Care Management: CRR required this visit?  No   CCM required this visit?  No     Plan:     I have personally reviewed and noted the following in the patient's chart:   Medical and social history Use of alcohol, tobacco or illicit drugs  Current medications and supplements including opioid prescriptions. Patient is not currently taking opioid prescriptions. Functional ability and status Nutritional status Physical activity Advanced directives List of other physicians Hospitalizations, surgeries, and ER visits in previous 12 months Vitals Screenings to include cognitive, depression, and  falls Referrals and appointments  In addition, I have reviewed and discussed with patient certain preventive protocols, quality metrics, and best practice recommendations. A written personalized care plan for preventive services as well as general preventive health recommendations were provided to patient.     Tora Kindred, CMA   01/04/2023   After Visit Summary: (MyChart) Due to this being a telephonic visit, the after visit summary with patients personalized plan was offered to patient via MyChart   Nurse Notes:  6 CIT score - 2 Patient refused Shingrix vaccine.

## 2023-01-06 DIAGNOSIS — E785 Hyperlipidemia, unspecified: Secondary | ICD-10-CM | POA: Diagnosis not present

## 2023-01-06 DIAGNOSIS — E039 Hypothyroidism, unspecified: Secondary | ICD-10-CM | POA: Diagnosis not present

## 2023-01-06 DIAGNOSIS — E1165 Type 2 diabetes mellitus with hyperglycemia: Secondary | ICD-10-CM | POA: Diagnosis not present

## 2023-01-06 DIAGNOSIS — N289 Disorder of kidney and ureter, unspecified: Secondary | ICD-10-CM | POA: Diagnosis not present

## 2023-01-06 DIAGNOSIS — M810 Age-related osteoporosis without current pathological fracture: Secondary | ICD-10-CM | POA: Diagnosis not present

## 2023-01-06 DIAGNOSIS — E559 Vitamin D deficiency, unspecified: Secondary | ICD-10-CM | POA: Diagnosis not present

## 2023-01-06 DIAGNOSIS — I10 Essential (primary) hypertension: Secondary | ICD-10-CM | POA: Diagnosis not present

## 2023-01-10 DIAGNOSIS — I10 Essential (primary) hypertension: Secondary | ICD-10-CM | POA: Diagnosis not present

## 2023-01-10 DIAGNOSIS — E559 Vitamin D deficiency, unspecified: Secondary | ICD-10-CM | POA: Diagnosis not present

## 2023-01-10 DIAGNOSIS — M810 Age-related osteoporosis without current pathological fracture: Secondary | ICD-10-CM | POA: Diagnosis not present

## 2023-01-10 DIAGNOSIS — E1159 Type 2 diabetes mellitus with other circulatory complications: Secondary | ICD-10-CM | POA: Diagnosis not present

## 2023-01-10 DIAGNOSIS — N289 Disorder of kidney and ureter, unspecified: Secondary | ICD-10-CM | POA: Diagnosis not present

## 2023-01-12 DIAGNOSIS — E1165 Type 2 diabetes mellitus with hyperglycemia: Secondary | ICD-10-CM | POA: Diagnosis not present

## 2023-01-14 DIAGNOSIS — E1165 Type 2 diabetes mellitus with hyperglycemia: Secondary | ICD-10-CM | POA: Diagnosis not present

## 2023-01-23 ENCOUNTER — Other Ambulatory Visit: Payer: Self-pay | Admitting: Nurse Practitioner

## 2023-01-25 NOTE — Telephone Encounter (Signed)
Requested by interface surescripts. Future visit in 2 months.  Requested Prescriptions  Pending Prescriptions Disp Refills   metoprolol tartrate (LOPRESSOR) 25 MG tablet [Pharmacy Med Name: Metoprolol Tartrate 25 MG Oral Tablet] 180 tablet 0    Sig: Take 1 tablet by mouth twice daily     Cardiovascular:  Beta Blockers Passed - 01/23/2023  7:50 AM      Passed - Last BP in normal range    BP Readings from Last 1 Encounters:  10/20/22 139/78         Passed - Last Heart Rate in normal range    Pulse Readings from Last 1 Encounters:  10/20/22 76         Passed - Valid encounter within last 6 months    Recent Outpatient Visits           3 months ago 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, with long-term current use of insulin (HCC)   [place] [facility] [place], [place] T, NP   9 months ago Type 2 diabetes mellitus with diabetic neuropathy, with long-term current use of insulin (HCC)   [facility] [facility] [place], [place] T, NP   1 year ago Type 2 diabetes mellitus with diabetic neuropathy, with long-term current use of insulin (HCC)   [place] [facility] [place], [place] T, NP   1 year ago 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, with long-term current use of insulin (HCC)   [place] [facility] [place], [place] T, NP   1 year ago Type 2 diabetes mellitus with diabetic neuropathy, with long-term current use of insulin (HCC)   [place] [facility] [place], Dorie Rank, NP       Future Appointments             In 2 months [place], Dorie Rank, NP [place] [facility]

## 2023-01-28 DIAGNOSIS — R768 Other specified abnormal immunological findings in serum: Secondary | ICD-10-CM | POA: Diagnosis not present

## 2023-02-08 ENCOUNTER — Ambulatory Visit (INDEPENDENT_AMBULATORY_CARE_PROVIDER_SITE_OTHER): Payer: 59 | Admitting: Family Medicine

## 2023-02-08 ENCOUNTER — Ambulatory Visit: Payer: Self-pay

## 2023-02-08 VITALS — BP 124/82 | HR 73 | Temp 98.3°F | Ht 64.76 in | Wt 175.0 lb

## 2023-02-08 DIAGNOSIS — B379 Candidiasis, unspecified: Secondary | ICD-10-CM | POA: Diagnosis not present

## 2023-02-08 DIAGNOSIS — R300 Dysuria: Secondary | ICD-10-CM | POA: Diagnosis not present

## 2023-02-08 DIAGNOSIS — Z23 Encounter for immunization: Secondary | ICD-10-CM

## 2023-02-08 LAB — URINALYSIS, ROUTINE W REFLEX MICROSCOPIC
Bilirubin, UA: NEGATIVE
Ketones, UA: NEGATIVE
Leukocytes,UA: NEGATIVE
Nitrite, UA: NEGATIVE
Specific Gravity, UA: 1.02 (ref 1.005–1.030)
Urobilinogen, Ur: 0.2 mg/dL (ref 0.2–1.0)
pH, UA: 5.5 (ref 5.0–7.5)

## 2023-02-08 LAB — WET PREP FOR TRICH, YEAST, CLUE
Clue Cell Exam: NEGATIVE
Trichomonas Exam: NEGATIVE
Yeast Exam: POSITIVE — AB

## 2023-02-08 LAB — MICROSCOPIC EXAMINATION: Bacteria, UA: NONE SEEN

## 2023-02-08 MED ORDER — FLUCONAZOLE 150 MG PO TABS
150.0000 mg | ORAL_TABLET | Freq: Once | ORAL | 0 refills | Status: AC
Start: 2023-02-08 — End: 2023-02-08

## 2023-02-08 NOTE — Assessment & Plan Note (Deleted)
Acute, ongoing. Wet prep positive for  yeast, UA.....awaiting urine culture. Given Fluconazole 150 MG tablet x1. Recommend increased fluids

## 2023-02-08 NOTE — Patient Instructions (Signed)
Increase water intake

## 2023-02-08 NOTE — Assessment & Plan Note (Signed)
Acute, ongoing. Wet prep positive for yeast, UA negative for leuks, positive for RBCs, protein, and glucose. Awaiting urine culture, will treat based on results. Given Fluconazole 150 MG tablet x1. Recommend increased fluids. Return as needed. Call sooner if concerns arise.

## 2023-02-08 NOTE — Progress Notes (Signed)
BP 124/82   Pulse 73   Temp 98.3 F (36.8 C) (Oral)   Ht 5' 4.76" (1.645 m)   Wt 175 lb (79.4 kg)   LMP  (LMP Unknown)   SpO2 98%   BMI 29.33 kg/m    Subjective:    Patient ID: Mary Parks, female    DOB: 1950/12/23, 72 y.o.   MRN: [id number]  HPI: Mary Parks is a 72 y.o. female  Chief Complaint  Patient presents with   Dysuria   URINARY SYMPTOMS Dysuria: burning Urinary frequency: yes Urgency: yes Small volume voids: no Symptom severity: yes Urinary incontinence: no Foul odor: no Hematuria: yes Abdominal pain: yes Back pain: yes Suprapubic pain/pressure: yes Flank pain: yes Fever:  no Vomiting: no Relief with cranberry juice: none Status: worse at night Previous urinary tract infection: no Recurrent urinary tract infection: no Treatments attempted: increasing fluids    Relevant past medical, surgical, family and social history reviewed and updated as indicated. Interim medical history since our last visit reviewed. Allergies and medications reviewed and updated.  Review of Systems  Constitutional:  Negative for chills and fever.  Respiratory: Negative.    Cardiovascular: Negative.   Gastrointestinal:  Negative for vomiting.  Genitourinary:  Positive for dysuria, frequency and urgency. Negative for decreased urine volume, difficulty urinating, dyspareunia, flank pain, hematuria and pelvic pain.  Musculoskeletal:  Negative for back pain.   Per HPI unless specifically indicated above     Objective:    BP 124/82   Pulse 73   Temp 98.3 F (36.8 C) (Oral)   Ht 5' 4.76" (1.645 m)   Wt 175 lb (79.4 kg)   LMP  (LMP Unknown)   SpO2 98%   BMI 29.33 kg/m   Wt Readings from Last 3 Encounters:  02/08/23 175 lb (79.4 kg)  01/04/23 175 lb (79.4 kg)  10/20/22 176 lb 0.6 oz (79.9 kg)    Physical Exam Vitals and nursing note reviewed.  Constitutional:      General: She is awake. She is not in acute distress.    Appearance: Normal appearance. She  is well-developed and well-groomed. She is not ill-appearing.  HENT:     Head: Normocephalic and atraumatic.     Right Ear: Hearing and external ear normal. No drainage.     Left Ear: Hearing and external ear normal. No drainage.     Nose: Nose normal.  Eyes:     General: Lids are normal.        Right eye: No discharge.        Left eye: No discharge.     Conjunctiva/sclera: Conjunctivae normal.  Cardiovascular:     Rate and Rhythm: Normal rate and regular rhythm.     Pulses:          Radial pulses are 2+ on the right side and 2+ on the left side.       Posterior tibial pulses are 2+ on the right side and 2+ on the left side.     Heart sounds: Normal heart sounds, S1 normal and S2 normal. No murmur heard.    No gallop.  Pulmonary:     Effort: Pulmonary effort is normal. No accessory muscle usage or respiratory distress.     Breath sounds: Normal breath sounds.  Abdominal:     Tenderness: There is abdominal tenderness in the suprapubic area. There is no right CVA tenderness, left CVA tenderness or guarding.  Musculoskeletal:  General: Normal range of motion.     Cervical back: Full passive range of motion without pain and normal range of motion.     Right lower leg: No edema.     Left lower leg: No edema.  Skin:    General: Skin is warm and dry.     Capillary Refill: Capillary refill takes less than 2 seconds.  Neurological:     Mental Status: She is alert and oriented to person, place, and time.  Psychiatric:        Attention and Perception: Attention normal.        Mood and Affect: Mood normal.        Speech: Speech normal.        Behavior: Behavior normal. Behavior is cooperative.        Thought Content: Thought content normal.     Results for orders placed or performed in visit on 10/22/22  HM DIABETES EYE EXAM  Result Value Ref Range   HM Diabetic Eye Exam No Retinopathy No Retinopathy      Assessment & Plan:   Problem List Items Addressed This Visit      Dysuria - Primary   Relevant Orders   Urinalysis, Routine w reflex microscopic   WET PREP FOR TRICH, YEAST, CLUE   Urine Culture   Yeast infection    Acute, ongoing. Wet prep positive for yeast, UA negative for leuks, positive for RBCs, protein, and glucose. Awaiting urine culture, will treat based on results. Given Fluconazole 150 MG tablet x1. Recommend increased fluids. Return as needed. Call sooner if concerns arise.       Relevant Medications   fluconazole (DIFLUCAN) 150 MG tablet   Other Visit Diagnoses     Needs flu shot       Relevant Orders   Flu Vaccine Trivalent High Dose (Fluad)   Need for COVID-19 vaccine       Relevant Orders   Pfizer Comirnaty Covid -19 Vaccine 59yrs and older        Follow up plan: Return if symptoms worsen or fail to improve.

## 2023-02-08 NOTE — Telephone Encounter (Signed)
Chief Complaint: Urinary Symptoms Symptoms: Burning with urination, vaginal itching, foul odor, right flank pain Frequency: constant onset 02/06/23 Pertinent Negatives: Patient denies pain with urination, abdominal pain Disposition: [] ED /[] Urgent Care (no appt availability in office) / [x] Appointment(In office/virtual)/ []  [place] [facility]/ [] Home Care/ [] Refused Recommended Disposition /[] [place] [facility]/ []  Follow-up with PCP Additional Notes: Patient states she is having burning with urination and vaginal itching that started Sunday and is getting worse. Patient also reports right side flank pain. Care advice given and patient has been scheduled for an appointment today at 1520. Advised patient to callback if symptoms change. Patient verbalized understanding.   Reason for Disposition  Urinating more frequently than usual (i.e., frequency)  Answer Assessment - Initial Assessment Questions 1. SYMPTOM: "What's the main symptom you're concerned about?" (e.g., frequency, incontinence)     Burning 2. ONSET: "When did the  Burning  start?"     Sunday  3. PAIN: "Is there any pain?" If Yes, ask: "How bad is it?" (Scale: 1-10; mild, moderate, severe)     7/10 4. CAUSE: "What do you think is causing the symptoms?"     I think I have a UTI 5. OTHER SYMPTOMS: "Do you have any other symptoms?" (e.g., blood in urine, fever, flank pain, pain with urination)     Vagina itching, right flank pain, foul odor  Protocols used: Urinary Symptoms-A-AH

## 2023-02-10 ENCOUNTER — Ambulatory Visit: Payer: Self-pay

## 2023-02-10 LAB — URINE CULTURE

## 2023-02-10 NOTE — Progress Notes (Signed)
Hi Mary Parks, your urine culture returned as negative. Please follow up with our office if your symptoms have not improved. Thank you for allowing me to participate in your care.

## 2023-02-10 NOTE — Telephone Encounter (Signed)
Chief Complaint: Pain lower abdomen Symptoms: vaginal itching Frequency: Onset Sunday Pertinent Negatives: Patient denies other symptoms Disposition: [] ED /[] Urgent Care (no appt availability in office) / [] Appointment(In office/virtual)/ []  [place] [facility]/ [] Home Care/ [] Refused Recommended Disposition /[] [place] [facility]/ [x]  Follow-up with PCP Additional Notes: Patient was seen on 02/08/23 for symptoms mentioned above and was prescribed fluconazole. She says she took the fluconazole on 02/08/23 after the visit. She says that she was told to call back for results of urine test and medication. Advised of the results per notes of Prescott Gum, NP on 02/10/23, patient verbalized understanding. Advised that no medication is needed due to no UTI per culture. Advised she could be scheduled tomorrow at 0900 with Jolene. She says her flight out of town is tomorrow at 0900 and she will be gone x 2 weeks, still in [place]. She says she probably is not giving the medication time to work. She says the itching is better, she bought cream to use. She says she will just give it time and call us if no improvement. Advised UC in the city she will be in. She verbalized understanding.   Hi Nurah, your urine culture returned as negative. Please follow up with our office if your symptoms have not improved. Thank you for allowing me to participate in your care.  Written by Weber Cooks, NP on 02/10/2023  9:50 AM EDT Summary: Pt stated she was told if she did not feel better by today to call to request another medication.   Pt stated that she was seen on Monday for an uti and she was prescribed a medication. Pt stated that she was told if she did not feel better by today to call to request another medication. Pt requests call back. Cb# [phone]     Reason for Disposition  [1] Caller has NON-URGENT question (includes prescribed medication questions) AND [2] triager unable to answer  Answer  Assessment - Initial Assessment Questions 1. MAIN CONCERN OR SYMPTOM:  "What is your main concern right now?" "What question do you have?" "What's the main symptom you're worried about?" (e.g., breathing difficulty, cough, fever. pain)     Pain in the bladder area, vaginal itching 2. ONSET: "When did the symptoms start?"     Sunday 3. BETTER-SAME-WORSE: "Are you getting better, staying the same, or getting worse compared to how you felt at your last visit to the doctor (most recent medical visit)?"     Same 4. VISIT DATE: "When were you seen?" (Date)     02/08/23 5. VISIT DOCTOR: "What is the name of the doctor taking care of you now?"     Rashelle Pearly, NP 6. VISIT DIAGNOSIS:  "What was the main symptom or problem that you were seen for?" "Were you given a diagnosis?"      Pain in the bladder area, vaginal itching.  7. VISIT MEDICINES: "Did the doctor order any new medicines for you to use?" If Yes, ask: "Have you filled the prescription and started taking the medicine?"      Diflucan, took it on 02/08/23 8. NEXT APPOINTMENT: "Have you scheduled a follow-up appointment with your doctor?"     No 9. PAIN: "Is there any pain?" If Yes, ask: "How bad is it?"  (Scale 0-10; or mild, moderate, severe)    - NONE (0): no pain    - MILD (1-3): doesn't interfere with normal activities     - MODERATE (4-7): interferes with normal activities or awakens  from sleep     - SEVERE (8-10): excruciating pain, unable to do any normal activities     5 10. FEVER: "Do you have a fever?" If Yes, ask: "What is it, how was it measured  and when did it start?"       No 11. OTHER SYMPTOMS: "Do you have any other symptoms?"       Side hurts like I told provider  Protocols used: Recent Medical Visit for Illness Follow-up Call-A-AH

## 2023-03-07 ENCOUNTER — Ambulatory Visit: Payer: Self-pay

## 2023-03-07 NOTE — Telephone Encounter (Signed)
Please reach out to patient and schedule as stated per provider.

## 2023-03-07 NOTE — Telephone Encounter (Signed)
Scheduled patient on 03/08/2023 @ 9:40 am.

## 2023-03-07 NOTE — Telephone Encounter (Signed)
  Chief Complaint: moderate right flank pain that radiates to right shoulder that comes and goes Symptoms: acid reflux Frequency: 1 week Pertinent Negatives: Patient denies fever, abdominal pain, vomiting, leg weakness, burning with urination blood in urine, chest pain or radiating left arm , neck or back pain, sweating Disposition: [] ED /[] Urgent Care (no appt availability in office) / [] Appointment(In office/virtual)/ []  [place] [facility]/ [] Home Care/ [] Refused Recommended Disposition /[] [place] [facility]/ [x]  Follow-up with PCP Additional Notes: pt refused to see any other provider. Only wants to see PCP. Advised next appt is Friday. Advised to pt will send over to see if can see pt. Pt would like an appt by 1000-1100 am tomorrow.   Reason for Disposition  MODERATE pain (e.g., interferes with normal activities or awakens from sleep)  Answer Assessment - Initial Assessment Questions 1. LOCATION: "Where does it hurt?" (e.g., left, right)     right 2. ONSET: "When did the pain start?"     1 week ago  3. SEVERITY: "How bad is the pain?" (e.g., Scale 1-10; mild, moderate, or severe)   - MILD (1-3): doesn't interfere with normal activities    - MODERATE (4-7): interferes with normal activities or awakens from sleep    - SEVERE (8-10): excruciating pain and patient unable to do normal activities (stays in bed)       moderate 4. PATTERN: "Does the pain come and go, or is it constant?"      Comes and goes   6. OTHER SYMPTOMS:  "Do you have any other symptoms?" (e.g., fever, abdomen pain, vomiting, leg weakness, burning with urination, blood in urine)     Acid reflux all the time  Protocols used: Flank Pain-A-AH

## 2023-03-08 ENCOUNTER — Ambulatory Visit (INDEPENDENT_AMBULATORY_CARE_PROVIDER_SITE_OTHER): Payer: 59 | Admitting: Nurse Practitioner

## 2023-03-08 ENCOUNTER — Encounter: Payer: Self-pay | Admitting: Nurse Practitioner

## 2023-03-08 ENCOUNTER — Other Ambulatory Visit: Payer: Self-pay | Admitting: Nurse Practitioner

## 2023-03-08 VITALS — BP 136/74 | HR 68 | Temp 98.3°F | Wt 173.0 lb

## 2023-03-08 DIAGNOSIS — R8281 Pyuria: Secondary | ICD-10-CM | POA: Diagnosis not present

## 2023-03-08 DIAGNOSIS — B379 Candidiasis, unspecified: Secondary | ICD-10-CM | POA: Diagnosis not present

## 2023-03-08 DIAGNOSIS — K5901 Slow transit constipation: Secondary | ICD-10-CM

## 2023-03-08 DIAGNOSIS — R109 Unspecified abdominal pain: Secondary | ICD-10-CM | POA: Insufficient documentation

## 2023-03-08 LAB — URINALYSIS, ROUTINE W REFLEX MICROSCOPIC
Bilirubin, UA: NEGATIVE
Ketones, UA: NEGATIVE
Leukocytes,UA: NEGATIVE
Nitrite, UA: NEGATIVE
Specific Gravity, UA: 1.02 (ref 1.005–1.030)
Urobilinogen, Ur: 1 mg/dL (ref 0.2–1.0)
pH, UA: 6.5 (ref 5.0–7.5)

## 2023-03-08 LAB — MICROSCOPIC EXAMINATION: Bacteria, UA: NONE SEEN

## 2023-03-08 MED ORDER — FLUCONAZOLE 150 MG PO TABS: 150.0000 mg | ORAL_TABLET | Freq: Once | ORAL | 0 refills | Status: AC

## 2023-03-08 NOTE — Assessment & Plan Note (Signed)
For >1 month.  Suspect this is more related to constipation which she struggles with, refer to this plan of care.  UA overall stable, will send for culture and if needed treat.  Consider imaging if ongoing pain.

## 2023-03-08 NOTE — Patient Instructions (Addendum)
METAMUCIL DAILY -- either powder form or no sugar gummy  Take one dose of Mag Citrate prior to starting Metamucil  Take the dose of Diflucan  Flank Pain, Adult Flank pain is pain in your side. The flank is the area on your side between your upper belly (abdomen) and your spine. The pain may occur over a short time (acute), or it may be long-term or come back often (chronic). It may be mild or very bad. Pain in this area can be caused by many different things. Follow these instructions at home:  Drink enough fluid to keep your pee (urine) pale yellow. Rest as told by your doctor. Take over-the-counter and prescription medicines only as told by your doctor. Keep a journal to keep track of: What has caused your flank pain. What has made your flank pain feel better. Keep all follow-up visits. Contact a doctor if: Medicine does not help your pain. You have new symptoms. Your pain gets worse. Your symptoms last longer than 2-3 days. You have trouble peeing. You are peeing more often than normal. Get help right away if: You have trouble breathing. You are short of breath. Your belly hurts, or it is swollen or red. You feel like you may vomit (nauseous). You vomit. You feel faint, or you faint. You have blood in your pee. You have flank pain and a fever. These symptoms may be an emergency. Get help right away. Call your local emergency services (911 in the [place] Do not wait to see if the symptoms will go away. Do not drive yourself to the hospital. Summary Flank pain is pain in your side. The flank is the area of your side between your upper belly (abdomen) and your spine. Flank pain may occur over a short time (acute), or it may be long-term or come back often (chronic). It may be mild or very bad. Pain in this area can be caused by many different things. Contact your doctor if your symptoms get worse or last longer than 2-3 days. This information is not intended to replace advice  given to you by your health care provider. Make sure you discuss any questions you have with your health care provider. Document Revised: 07/21/2020 Document Reviewed: 07/21/2020 Elsevier Patient Education  2024 [employer].

## 2023-03-08 NOTE — Assessment & Plan Note (Signed)
Recurrent, last treated in September.  Discussed with her we will need to monitor closely for ongoing recurrence as she does take [place].  Diflucan sent in today.

## 2023-03-08 NOTE — Telephone Encounter (Signed)
Requested Prescriptions  Pending Prescriptions Disp Refills   LANTUS SOLOSTAR 100 UNIT/ML Solostar Pen [Pharmacy Med Name: Lantus SoloStar 100 UNIT/ML Subcutaneous Solution Pen-injector] 180 mL 0    Sig: INJECT 45 UNITS SUBCUTANEOUSLY IN THE MORNING AND 40 UNITS AT NIGHT     Endocrinology:  Diabetes - Insulins Passed - 03/08/2023  6:51 AM      Passed - HBA1C is between 0 and 7.9 and within 180 days    Hemoglobin A1C  Date Value Ref Range Status  01/02/2020 9.3  Final   HB A1C (BAYER DCA - WAIVED)  Date Value Ref Range Status  10/20/2022 7.8 (H) 4.8 - 5.6 % Final    Comment:             Prediabetes: 5.7 - 6.4          Diabetes: >6.4          Glycemic control for adults with diabetes: <7.0          Passed - Valid encounter within last 6 months    Recent Outpatient Visits           Today Slow transit constipation   [place] [facility] [place], McCall T, NP   4 weeks ago Dysuria   [facility] [place], Sherran Needs, NP   4 months ago 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, with long-term current use of insulin (HCC)   [place] [facility] [place], [place] T, NP   10 months ago Type 2 diabetes mellitus with diabetic neuropathy, with long-term current use of insulin (HCC)   [facility], Alsen T, NP   1 year ago Type 2 diabetes mellitus with diabetic neuropathy, with long-term current use of insulin (HCC)   [facility] [place], Dorie Rank, NP       Future Appointments             In 1 month Cannady, Dorie Rank, NP [facility], [facility]

## 2023-03-08 NOTE — Assessment & Plan Note (Signed)
Ongoing issue, suspect is causing some her her current discomfort and bloating. She does take Trulicity, educated her on how important it is to pass bowels regularly while taking this.  Recommend she start a bowel regimen.  She can take one dose of Mag Citrate to assist in passing bowels at present, this worked well last time.  Then recommend she start daily Metamucil, can take sugar free gummies.  May take Senna S as needed.  Ensure plenty of water and fiber intake.  Goal is to have daily BM that is easier to pass, currently she is more Bristol Type 1, would like to see Type 4.  Return as scheduled, sooner if worsening.  If ongoing will get labs and imaging.

## 2023-03-08 NOTE — Progress Notes (Signed)
Acute Office Visit  Subjective:     Patient ID: Mary Parks, female    DOB: 09-05-1950, 72 y.o.   MRN: [id number]  Chief Complaint  Patient presents with   Flank Pain    Patient states she has been having R side pain that runs into her lower back and up towards her shoulder. States this pain has been going on for a while but has gotten worse recently.     Has been present a long time, >1 month. Right side midline and going into back, occasionally to front of stomach.  Last saw nephrology at [facility] in April 2024, follows for CKD.  Had renal cysts to both sides.  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  Initially came and go, now more constant.  Was picking up water at [facility] recently and this made pain worse.  Moving and shifting makes worse.  Was treated for yeast in September 2024. Endorses constipation -- has BM every other day at baseline and sometimes hard to pass.  Does not take any OTC medications.  Took Mag citrate in past and this helped.  Drinks plenty of fluid and fiber.   Reports her stools are Bristol Type 1.  Flank Pain This is a new problem. The current episode started more than 1 month ago. The problem occurs constantly. The problem has been waxing and waning since onset. The pain is present in the thoracic spine (Midline lateral abdomen right and radiates to back). The quality of the pain is described as cramping and shooting. Radiates to: to mid back and shoulder blade on occasion. The pain is at a severity of 5/10. The pain is moderate. The pain is The same all the time. The symptoms are aggravated by twisting, bending and position. Stiffness is present In the morning. Associated symptoms include abdominal pain (occasionally from acid reflux). Pertinent negatives include no bladder incontinence, bowel incontinence, chest pain, dysuria, fever, headaches, numbness, tingling, weakness or weight loss. Risk factors include  obesity, lack of exercise and sedentary lifestyle. Treatments tried: Tylenol, which helps. The treatment provided moderate relief.   Patient is in today for flank pain to right side and radiate to back. Reports has been present a long time.  Review of Systems  Constitutional:  Negative for diaphoresis, fever, malaise/fatigue and weight loss.  Respiratory:  Negative for cough, sputum production, shortness of breath and wheezing.   Cardiovascular:  Negative for chest pain, palpitations, orthopnea, claudication and leg swelling.  Gastrointestinal:  Positive for abdominal pain (occasionally from acid reflux), constipation and heartburn. Negative for bowel incontinence, diarrhea, nausea and vomiting.  Genitourinary:  Positive for flank pain. Negative for bladder incontinence, dysuria, frequency, hematuria and urgency.  Neurological:  Negative for tingling, tremors, weakness, numbness and headaches.  Psychiatric/Behavioral: Negative.        Objective:    BP 136/74 (BP Location: Left Arm)   Pulse 68   Temp 98.3 F (36.8 C) (Oral)   Wt 173 lb (78.5 kg)   LMP  (LMP Unknown)   SpO2 98%   BMI 29.00 kg/m  BP Readings from Last 3 Encounters:  03/08/23 136/74  02/08/23 124/82  10/20/22 139/78   Wt Readings from Last 3 Encounters:  03/08/23 173 lb (78.5 kg)  02/08/23 175 lb (79.4 kg)  01/04/23 175 lb (79.4 kg)   Physical Exam Vitals and nursing note reviewed.  Constitutional:  General: She is awake. She is not in acute distress.    Appearance: Normal appearance. She is well-developed, well-groomed and overweight. She is not ill-appearing or toxic-appearing.  HENT:     Head: Normocephalic.     Right Ear: Hearing and external ear normal.     Left Ear: Hearing and external ear normal.  Eyes:     General: Lids are normal.        Right eye: No discharge.        Left eye: No discharge.     Conjunctiva/sclera: Conjunctivae normal.     Pupils: Pupils are equal, round, and reactive to  light.  Neck:     Thyroid: No thyromegaly.     Vascular: No carotid bruit.  Cardiovascular:     Rate and Rhythm: Normal rate and regular rhythm.     Heart sounds: Normal heart sounds. No murmur heard.    No gallop.  Pulmonary:     Effort: Pulmonary effort is normal. No accessory muscle usage or respiratory distress.     Breath sounds: Normal breath sounds. No decreased breath sounds, wheezing or rhonchi.  Abdominal:     General: Bowel sounds are normal. There is distension (soft but slightly distended).     Palpations: Abdomen is soft.     Tenderness: There is abdominal tenderness (very mild to right mid quadrant). There is no right CVA tenderness, left CVA tenderness, guarding or rebound. Negative signs include Murphy's sign.  Musculoskeletal:     Cervical back: Normal range of motion and neck supple.     Right lower leg: No edema.     Left lower leg: No edema.  Lymphadenopathy:     Cervical: No cervical adenopathy.  Skin:    General: Skin is warm and dry.  Neurological:     Mental Status: She is alert and oriented to person, place, and time.     Deep Tendon Reflexes: Reflexes are normal and symmetric.     Reflex Scores:      Brachioradialis reflexes are 2+ on the right side and 2+ on the left side.      Patellar reflexes are 2+ on the right side and 2+ on the left side. Psychiatric:        Attention and Perception: Attention normal.        Mood and Affect: Mood normal.        Speech: Speech normal.        Behavior: Behavior normal. Behavior is cooperative.        Thought Content: Thought content normal.    No results found for any visits on 03/08/23.      Assessment & Plan:   Problem List Items Addressed This Visit       Digestive   Slow transit constipation - Primary    Ongoing issue, suspect is causing some her her current discomfort and bloating. She does take Trulicity, educated her on how important it is to pass bowels regularly while taking this.  Recommend she  start a bowel regimen.  She can take one dose of Mag Citrate to assist in passing bowels at present, this worked well last time.  Then recommend she start daily Metamucil, can take sugar free gummies.  May take Senna S as needed.  Ensure plenty of water and fiber intake.  Goal is to have daily BM that is easier to pass, currently she is more Bristol Type 1, would like to see Type 4.  Return as scheduled, sooner if  worsening.  If ongoing will get labs and imaging.        Other   Flank pain    For >1 month.  Suspect this is more related to constipation which she struggles with, refer to this plan of care.  UA overall stable, will send for culture and if needed treat.  Consider imaging if ongoing pain.      Relevant Orders   Urinalysis, Routine w reflex microscopic   Yeast infection    Recurrent, last treated in September.  Discussed with her we will need to monitor closely for ongoing recurrence as she does take [place].  Diflucan sent in today.      Relevant Medications   fluconazole (DIFLUCAN) 150 MG tablet   Other Visit Diagnoses     Pyuria       Check urine culture.       Meds ordered this encounter  Medications   fluconazole (DIFLUCAN) 150 MG tablet    Sig: Take 1 tablet (150 mg total) by mouth once for 1 dose.    Dispense:  1 tablet    Refill:  0    Return for As scheduled December 2nd.  Marjie Skiff, NP

## 2023-03-09 ENCOUNTER — Telehealth: Payer: Self-pay | Admitting: Nurse Practitioner

## 2023-03-09 DIAGNOSIS — R8281 Pyuria: Secondary | ICD-10-CM | POA: Diagnosis not present

## 2023-03-09 NOTE — Telephone Encounter (Unsigned)
Copied from CRM [id number]. Topic: General - Other >> Mar 09, 2023 11:19 AM Franchot Heidelberg wrote: Reason for CRM: Pt wants to discuss her labs results

## 2023-03-09 NOTE — Telephone Encounter (Signed)
Left message for patient to give our office a call back to discuss her lab results.  OK for [facility] to gather information if patient calls back.

## 2023-03-09 NOTE — Telephone Encounter (Signed)
Patient returned call saying she did not receive all of her lab results, that she was told she has a yeast infection and a medication was sent in for that. I advised her labs are back, but the provider hasn't reviewed them yet. I asked her to hold while I call to see if her CMA is available. Spoke to [name], [facility] who says [name] is not available to speak to the patient and to send a message over with the questions the patient has regarding her labs. Patient says she just wants to know what they are, if she has anything besides a yeast infection and she says she can be called back tomorrow morning. Advised I will get this over to [name].

## 2023-03-10 NOTE — Telephone Encounter (Signed)
Called and notified patient of providers message. Patient verbalized understanding.  

## 2023-03-11 LAB — URINE CULTURE

## 2023-03-11 NOTE — Progress Notes (Signed)
Contacted via MyChart   No urine infection present!!  Woohoo!!

## 2023-03-24 DIAGNOSIS — Z794 Long term (current) use of insulin: Secondary | ICD-10-CM | POA: Diagnosis not present

## 2023-03-24 DIAGNOSIS — R809 Proteinuria, unspecified: Secondary | ICD-10-CM | POA: Diagnosis not present

## 2023-03-24 DIAGNOSIS — E1129 Type 2 diabetes mellitus with other diabetic kidney complication: Secondary | ICD-10-CM | POA: Diagnosis not present

## 2023-03-24 DIAGNOSIS — N1832 Chronic kidney disease, stage 3b: Secondary | ICD-10-CM | POA: Diagnosis not present

## 2023-03-29 ENCOUNTER — Ambulatory Visit
Admission: RE | Admit: 2023-03-29 | Discharge: 2023-03-29 | Disposition: A | Discharge status: Home / Self Care | Payer: 59 | Source: Ambulatory Visit | Attending: Nurse Practitioner | Admitting: Nurse Practitioner

## 2023-03-29 DIAGNOSIS — Z780 Asymptomatic menopausal state: Secondary | ICD-10-CM | POA: Diagnosis not present

## 2023-03-29 NOTE — Progress Notes (Signed)
Contacted via MyChart   You have a nice and normal bone density, for this we repeat in 10 years, sooner if you have a fracture.:)

## 2023-03-31 DIAGNOSIS — N1832 Chronic kidney disease, stage 3b: Secondary | ICD-10-CM | POA: Diagnosis not present

## 2023-03-31 DIAGNOSIS — I10 Essential (primary) hypertension: Secondary | ICD-10-CM | POA: Diagnosis not present

## 2023-03-31 DIAGNOSIS — Z794 Long term (current) use of insulin: Secondary | ICD-10-CM | POA: Diagnosis not present

## 2023-03-31 DIAGNOSIS — E1159 Type 2 diabetes mellitus with other circulatory complications: Secondary | ICD-10-CM | POA: Diagnosis not present

## 2023-04-08 DIAGNOSIS — E039 Hypothyroidism, unspecified: Secondary | ICD-10-CM | POA: Diagnosis not present

## 2023-04-08 DIAGNOSIS — E559 Vitamin D deficiency, unspecified: Secondary | ICD-10-CM | POA: Diagnosis not present

## 2023-04-08 DIAGNOSIS — T7840XD Allergy, unspecified, subsequent encounter: Secondary | ICD-10-CM | POA: Diagnosis not present

## 2023-04-08 DIAGNOSIS — M899 Disorder of bone, unspecified: Secondary | ICD-10-CM | POA: Diagnosis not present

## 2023-04-08 DIAGNOSIS — I10 Essential (primary) hypertension: Secondary | ICD-10-CM | POA: Diagnosis not present

## 2023-04-08 DIAGNOSIS — E785 Hyperlipidemia, unspecified: Secondary | ICD-10-CM | POA: Diagnosis not present

## 2023-04-08 DIAGNOSIS — E1165 Type 2 diabetes mellitus with hyperglycemia: Secondary | ICD-10-CM | POA: Diagnosis not present

## 2023-04-08 DIAGNOSIS — N289 Disorder of kidney and ureter, unspecified: Secondary | ICD-10-CM | POA: Diagnosis not present

## 2023-04-11 DIAGNOSIS — I10 Essential (primary) hypertension: Secondary | ICD-10-CM | POA: Diagnosis not present

## 2023-04-11 DIAGNOSIS — E785 Hyperlipidemia, unspecified: Secondary | ICD-10-CM | POA: Diagnosis not present

## 2023-04-11 DIAGNOSIS — E039 Hypothyroidism, unspecified: Secondary | ICD-10-CM | POA: Diagnosis not present

## 2023-04-11 DIAGNOSIS — E559 Vitamin D deficiency, unspecified: Secondary | ICD-10-CM | POA: Diagnosis not present

## 2023-04-11 DIAGNOSIS — E1165 Type 2 diabetes mellitus with hyperglycemia: Secondary | ICD-10-CM | POA: Diagnosis not present

## 2023-04-12 ENCOUNTER — Other Ambulatory Visit: Payer: Self-pay | Admitting: Nurse Practitioner

## 2023-04-13 DIAGNOSIS — E1165 Type 2 diabetes mellitus with hyperglycemia: Secondary | ICD-10-CM | POA: Diagnosis not present

## 2023-04-14 NOTE — Telephone Encounter (Signed)
Requested Prescriptions  Pending Prescriptions Disp Refills   rosuvastatin (CRESTOR) 40 MG tablet [Pharmacy Med Name: Rosuvastatin Calcium 40 MG Oral Tablet] 90 tablet 0    Sig: TAKE 1 TABLET BY MOUTH AT BEDTIME     Cardiovascular:  Antilipid - Statins 2 Failed - 04/12/2023  4:31 PM      Failed - Cr in normal range and within 360 days    Creatinine, Ser  Date Value Ref Range Status  10/20/2022 1.31 (H) 0.57 - 1.00 mg/dL Final         Failed - Lipid Panel in normal range within the last 12 months    Cholesterol, Total  Date Value Ref Range Status  10/20/2022 153 100 - 199 mg/dL Final   Cholesterol Piccolo, Waived  Date Value Ref Range Status  07/07/2015 157 <200 mg/dL Final    Comment:                            Desirable                <200                         Borderline High      200- 239                         High                     >239    LDL Chol Calc (NIH)  Date Value Ref Range Status  10/20/2022 86 0 - 99 mg/dL Final   HDL  Date Value Ref Range Status  10/20/2022 50 >39 mg/dL Final   Triglycerides  Date Value Ref Range Status  10/20/2022 91 0 - 149 mg/dL Final   Triglycerides Piccolo,Waived  Date Value Ref Range Status  07/07/2015 135 <150 mg/dL Final    Comment:                            Normal                   <150                         Borderline High     150 - 199                         High                200 - 499                         Very High                >499          Passed - Patient is not pregnant      Passed - Valid encounter within last 12 months    Recent Outpatient Visits           1 month ago Slow transit constipation   [facility] [place], Corrie Dandy T, NP   2 months ago Dysuria   [facility] [place], Sherran Needs, NP   5 months ago Diabetes mellitus due to underlying condition with stage  3a chronic kidney disease, with long-term current use of insulin (HCC)   [facility] [place], [place] T, NP   12 months ago Type 2 diabetes mellitus with diabetic neuropathy, with long-term current use of insulin (HCC)   [facility] [place], [place] T, NP   1 year ago Type 2 diabetes mellitus with diabetic neuropathy, with long-term current use of insulin (HCC)   [facility] [place], Dorie Rank, NP       Future Appointments             In 1 week Cannady, Dorie Rank, NP [place] [facility], [facility]

## 2023-04-20 ENCOUNTER — Other Ambulatory Visit: Payer: Self-pay | Admitting: Nurse Practitioner

## 2023-04-20 NOTE — Telephone Encounter (Signed)
Requested Prescriptions  Pending Prescriptions Disp Refills   fluticasone (FLONASE) 50 MCG/ACT nasal spray [Pharmacy Med Name: Fluticasone Propionate 50 MCG/ACT Nasal Suspension] 16 g 0    Sig: Use 2 spray(s) in each nostril once daily     Ear, Nose, and Throat: Nasal Preparations - Corticosteroids Passed - 04/20/2023  9:05 AM      Passed - Valid encounter within last 12 months    Recent Outpatient Visits           1 month ago Slow transit constipation   [place] [facility] [place], Sanger T, NP   2 months ago Dysuria   [place] [facility] [place], Sherran Needs, NP   6 months ago 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, with long-term current use of insulin (HCC)   [place] [facility] [place], Clayton T, NP   1 year ago Type 2 diabetes mellitus with diabetic neuropathy, with long-term current use of insulin (HCC)   [place] [facility] [place], Conestee T, NP   1 year ago Type 2 diabetes mellitus with diabetic neuropathy, with long-term current use of insulin (HCC)   [place] [facility] [place], Dorie Rank, NP       Future Appointments             In 5 days Cannady, Dorie Rank, NP [place] [address] [facility], [facility]

## 2023-04-22 ENCOUNTER — Ambulatory Visit: Payer: 59 | Admitting: Nurse Practitioner

## 2023-04-22 NOTE — Patient Instructions (Signed)
Be Involved in Caring For Your Health:  Taking Medications When medications are taken as directed, they can greatly improve your health. But if they are not taken as prescribed, they may not work. In some cases, not taking them correctly can be harmful. To help ensure your treatment remains effective and safe, understand your medications and how to take them. Bring your medications to each visit for review by your provider.  Your lab results, notes, and after visit summary will be available on My Chart. We strongly encourage you to use this feature. If lab results are abnormal the clinic will contact you with the appropriate steps. If the clinic does not contact you assume the results are satisfactory. You can always view your results on My Chart. If you have questions regarding your health or results, please contact the clinic during office hours. You can also ask questions on My Chart.  We at [facility] are grateful that you chose [place] to provide your care. We strive to provide evidence-based and compassionate care and are always looking for feedback. If you get a survey from the clinic please complete this so we can hear your opinions.  Diabetes Mellitus and Foot Care Diabetes, also called diabetes mellitus, may cause problems with your feet and legs because of poor blood flow (circulation). Poor circulation may make your skin: Become thinner and drier. Break more easily. Heal more slowly. Peel and crack. You may also have nerve damage (neuropathy). This can cause decreased feeling in your legs and feet. This means that you may not notice minor injuries to your feet that could lead to more serious problems. Finding and treating problems early is the best way to prevent future foot problems. How to care for your feet Foot hygiene  Wash your feet daily with warm water and mild soap. Do not use hot water. Then, pat your feet and the areas between your toes until they are fully dry. Do  not soak your feet. This can dry your skin. Trim your toenails straight across. Do not dig under them or around the cuticle. File the edges of your nails with an emery board or nail file. Apply a moisturizing lotion or petroleum jelly to the skin on your feet and to dry, brittle toenails. Use lotion that does not contain alcohol and is unscented. Do not apply lotion between your toes. Shoes and socks Wear clean socks or stockings every day. Make sure they are not too tight. Do not wear knee-high stockings. These may decrease blood flow to your legs. Wear shoes that fit well and have enough cushioning. Always look in your shoes before you put them on to be sure there are no objects inside. To break in new shoes, wear them for just a few hours a day. This prevents injuries on your feet. Wounds, scrapes, corns, and calluses  Check your feet daily for blisters, cuts, bruises, sores, and redness. If you cannot see the bottom of your feet, use a mirror or ask someone for help. Do not cut off corns or calluses or try to remove them with medicine. If you find a minor scrape, cut, or break in the skin on your feet, keep it and the skin around it clean and dry. You may clean these areas with mild soap and water. Do not clean the area with peroxide, alcohol, or iodine. If you have a wound, scrape, corn, or callus on your foot, look at it several times a day to make sure it  is healing and not infected. Check for: Redness, swelling, or pain. Fluid or blood. Warmth. Pus or a bad smell. General tips Do not cross your legs. This may decrease blood flow to your feet. Do not use heating pads or hot water bottles on your feet. They may burn your skin. If you have lost feeling in your feet or legs, you may not know this is happening until it is too late. Protect your feet from hot and cold by wearing shoes, such as at the beach or on hot pavement. Schedule a complete foot exam at least once a year or more often if  you have foot problems. Report any cuts, sores, or bruises to your health care provider right away. Where to find more information American Diabetes Association: diabetes.org Association of Diabetes Care & Education Specialists: diabeteseducator.org Contact a health care provider if: You have a condition that increases your risk of infection, and you have any cuts, sores, or bruises on your feet. You have an injury that is not healing. You have redness on your legs or feet. You feel burning or tingling in your legs or feet. You have pain or cramps in your legs and feet. Your legs or feet are numb. Your feet always feel cold. You have pain around any toenails. Get help right away if: You have a wound, scrape, corn, or callus on your foot and: You have signs of infection. You have a fever. You have a red line going up your leg. This information is not intended to replace advice given to you by your health care provider. Make sure you discuss any questions you have with your health care provider. Document Revised: 11/11/2021 Document Reviewed: 11/11/2021 Elsevier Patient Education  2024 [employer].

## 2023-04-23 ENCOUNTER — Other Ambulatory Visit: Payer: Self-pay | Admitting: Nurse Practitioner

## 2023-04-25 ENCOUNTER — Encounter: Payer: Self-pay | Admitting: Nurse Practitioner

## 2023-04-25 ENCOUNTER — Ambulatory Visit: Payer: 59 | Admitting: Nurse Practitioner

## 2023-04-25 VITALS — BP 133/70 | HR 66 | Temp 98.4°F | Ht 64.7 in | Wt 174.0 lb

## 2023-04-25 DIAGNOSIS — J4520 Mild intermittent asthma, uncomplicated: Secondary | ICD-10-CM

## 2023-04-25 DIAGNOSIS — I129 Hypertensive chronic kidney disease with stage 1 through stage 4 chronic kidney disease, or unspecified chronic kidney disease: Secondary | ICD-10-CM

## 2023-04-25 DIAGNOSIS — E785 Hyperlipidemia, unspecified: Secondary | ICD-10-CM | POA: Diagnosis not present

## 2023-04-25 DIAGNOSIS — E66811 Obesity, class 1: Secondary | ICD-10-CM

## 2023-04-25 DIAGNOSIS — N1831 Chronic kidney disease, stage 3a: Secondary | ICD-10-CM | POA: Diagnosis not present

## 2023-04-25 DIAGNOSIS — E669 Obesity, unspecified: Secondary | ICD-10-CM

## 2023-04-25 DIAGNOSIS — Z7984 Long term (current) use of oral hypoglycemic drugs: Secondary | ICD-10-CM | POA: Diagnosis not present

## 2023-04-25 DIAGNOSIS — E039 Hypothyroidism, unspecified: Secondary | ICD-10-CM | POA: Diagnosis not present

## 2023-04-25 DIAGNOSIS — E890 Postprocedural hypothyroidism: Secondary | ICD-10-CM

## 2023-04-25 DIAGNOSIS — E1140 Type 2 diabetes mellitus with diabetic neuropathy, unspecified: Secondary | ICD-10-CM | POA: Diagnosis not present

## 2023-04-25 DIAGNOSIS — E1122 Type 2 diabetes mellitus with diabetic chronic kidney disease: Secondary | ICD-10-CM | POA: Diagnosis not present

## 2023-04-25 DIAGNOSIS — E1169 Type 2 diabetes mellitus with other specified complication: Secondary | ICD-10-CM | POA: Diagnosis not present

## 2023-04-25 DIAGNOSIS — R768 Other specified abnormal immunological findings in serum: Secondary | ICD-10-CM

## 2023-04-25 DIAGNOSIS — J45909 Unspecified asthma, uncomplicated: Secondary | ICD-10-CM | POA: Diagnosis not present

## 2023-04-25 DIAGNOSIS — Z6829 Body mass index (BMI) 29.0-29.9, adult: Secondary | ICD-10-CM

## 2023-04-25 DIAGNOSIS — Z794 Long term (current) use of insulin: Secondary | ICD-10-CM

## 2023-04-25 MED ORDER — MONTELUKAST SODIUM 10 MG PO TABS: 10.0000 mg | ORAL_TABLET | Freq: Every day | ORAL | 4 refills | Status: DC

## 2023-04-25 MED ORDER — LEVOTHYROXINE SODIUM 88 MCG PO TABS: 88.0000 ug | ORAL_TABLET | Freq: Every morning | ORAL | 4 refills | Status: DC

## 2023-04-25 MED ORDER — MECLIZINE HCL 12.5 MG PO TABS: 12.5000 mg | ORAL_TABLET | Freq: Three times a day (TID) | ORAL | 0 refills | Status: DC | PRN

## 2023-04-25 MED ORDER — FARXIGA 10 MG PO TABS: 10.0000 mg | ORAL_TABLET | Freq: Every day | ORAL | 4 refills | Status: DC

## 2023-04-25 MED ORDER — PANTOPRAZOLE SODIUM 40 MG PO TBEC: 40.0000 mg | DELAYED_RELEASE_TABLET | Freq: Every day | ORAL | 4 refills | Status: DC

## 2023-04-25 NOTE — Assessment & Plan Note (Signed)
Chronic, stable. BP at goal for age today and at goal on home readings.  Continue current medication regimen, Lisinopril for kidney protection with diabetes, and collaboration with [facility] nephrology.  Recommend she monitor BP at home at least 3 mornings a week.  LABS: up to date with nephrology.  Urine ALB 150 May 2024.  Focus on DASH diet at home.

## 2023-04-25 NOTE — Progress Notes (Signed)
BP 133/70   Pulse 66   Temp 98.4 F (36.9 C) (Oral)   Ht 5' 4.7" (1.643 m)   Wt 174 lb (78.9 kg)   LMP  (LMP Unknown)   SpO2 98%   BMI 29.22 kg/m    Subjective:    Patient ID: Mary Parks, female    DOB: May 21, 1951, 72 y.o.   MRN: [id number]  HPI: Mary Parks is a 72 y.o. female  Chief Complaint  Patient presents with   Diabetes   Hypertension   Hyperlipidemia   Hypothyroidism   Asthma   Chronic Kidney Disease   DIABETES Follows with Dr. Dario Guardian, last A1c 8.4%.  No changes made recently.  Currently taking Trulicity 3 MG weekly, Lantus (40 units morning and 40 units at night) & Humalog 10 in morning, 10 lunch, and 6 in evening + Farxiga 10 MG. A1c has remained in 8-9 range.  Wears Dexcom. Endorses she enjoys sweets. Hypoglycemic episodes: none Polydipsia/polyuria: no Visual disturbance: no Chest pain: no Paresthesias: no Glucose Monitoring: yes             Accucheck frequency: Dexcom -- average glucose over 90 days was 212, in range 38% - high 31% - very high 29% - low 1%             Fasting glucose:              Post prandial:             Evening:              Before meals: Taking Insulin?: yes             Long acting insulin: 40 units morning and 40 units at night             Short acting insulin: 10 in morning, 10 lunch, and 6 in evening  Blood Pressure Monitoring: a few times a week Retinal Examination: Up To Date  Foot Exam: Up to Date Pneumovax: Up to Date Influenza: Up to Date Aspirin: yes  ASTHMA Continues on Symbicort and Albuterol + Singulair. Asthma status: stable Satisfied with current treatment?: yes Albuterol/rescue inhaler frequency: rarely Dyspnea frequency: no Wheezing frequency: no Cough frequency: none Nocturnal symptom frequency: none Limitation of activity: no Current upper respiratory symptoms: no Aerochamber/spacer use: no Visits to ER or Urgent Care in past year: no Pneumovax: Up to Date Influenza: Up to Date    HYPOTHYROIDISM Follows with endocrinology, taking 88 MCG Levothyroxine.   Continues to follow with rheumatology, last visit 01/28/23.  Watchful waiting. Thyroid control status:stable Satisfied with current treatment? yes Medication side effects: no Medication compliance: good compliance Etiology of hypothyroidism:  Recent dose adjustment:no Fatigue: no Cold intolerance: no Heat intolerance: no Weight gain: no Weight loss: no Constipation: no -- has improved Diarrhea/loose stools: no Palpitations: no Lower extremity edema: no Anxiety/depressed mood: no   CHRONIC KIDNEY DISEASE Nephrology at [facility], last saw in 03/31/23.  CRT 1.41 and eGFR 39 at their visit. CKD status: stable Medications renally dose: yes Previous renal evaluation: yes Pneumovax:  Up to Date Influenza Vaccine:  Up to Date  HYPERTENSION / HYPERLIPIDEMIA Current medications include Metoprolol, Lisinopril + Crestor for HLD.   Satisfied with current treatment? yes Duration of hypertension: chronic BP monitoring frequency: weekly BP range: 120/70 range BP medication side effects: no Duration of hyperlipidemia: chronic Cholesterol medication side effects: no Cholesterol supplements: none Medication compliance: good compliance Aspirin: yes Recent stressors: no Recurrent headaches: no Visual  changes: no Palpitations: no Dyspnea: no Chest pain: no Lower extremity edema: no Dizzy/lightheaded: occasional with vertigo, improves with Meclizine  Relevant past medical, surgical, family and social history reviewed and updated as indicated. Interim medical history since our last visit reviewed. Allergies and medications reviewed and updated.  Review of Systems  Constitutional:  Negative for activity change, appetite change, diaphoresis, fatigue and fever.  Respiratory:  Negative for cough, chest tightness and shortness of breath.   Cardiovascular:  Negative for chest pain, palpitations and leg swelling.   Gastrointestinal: Negative.   Endocrine: Negative for cold intolerance, heat intolerance, polydipsia, polyphagia and polyuria.  Neurological: Negative.   Psychiatric/Behavioral: Negative.     Per HPI unless specifically indicated above     Objective:    BP 133/70   Pulse 66   Temp 98.4 F (36.9 C) (Oral)   Ht 5' 4.7" (1.643 m)   Wt 174 lb (78.9 kg)   LMP  (LMP Unknown)   SpO2 98%   BMI 29.22 kg/m   Wt Readings from Last 3 Encounters:  04/25/23 174 lb (78.9 kg)  03/08/23 173 lb (78.5 kg)  02/08/23 175 lb (79.4 kg)    Physical Exam Vitals and nursing note reviewed.  Constitutional:      General: She is awake. She is not in acute distress.    Appearance: She is well-developed and well-groomed. She is obese. She is not ill-appearing.  HENT:     Head: Normocephalic.     Right Ear: Hearing and external ear normal. No drainage.     Left Ear: Hearing and external ear normal. No drainage.  Eyes:     General: Lids are normal.        Right eye: No discharge.        Left eye: No discharge.     Conjunctiva/sclera: Conjunctivae normal.     Pupils: Pupils are equal, round, and reactive to light.  Neck:     Vascular: No carotid bruit.  Cardiovascular:     Rate and Rhythm: Normal rate and regular rhythm.     Heart sounds: Normal heart sounds.  Pulmonary:     Effort: Pulmonary effort is normal. No accessory muscle usage or respiratory distress.     Breath sounds: Normal breath sounds.  Abdominal:     General: Bowel sounds are normal. There is no distension.     Palpations: Abdomen is soft.     Tenderness: There is no abdominal tenderness.  Musculoskeletal:     Cervical back: Normal range of motion and neck supple.     Right lower leg: No edema.     Left lower leg: No edema.  Lymphadenopathy:     Cervical: No cervical adenopathy.  Skin:    General: Skin is warm and dry.  Neurological:     Mental Status: She is alert and oriented to person, place, and time.     Motor:  Motor function is intact.     Coordination: Coordination is intact.     Gait: Gait is intact.     Deep Tendon Reflexes:     Reflex Scores:      Brachioradialis reflexes are 2+ on the right side and 2+ on the left side.      Patellar reflexes are 2+ on the right side and 2+ on the left side. Psychiatric:        Attention and Perception: Attention normal.        Mood and Affect: Mood normal.  Speech: Speech normal.        Behavior: Behavior normal. Behavior is cooperative.        Thought Content: Thought content normal.    Results for orders placed or performed in visit on 03/08/23  Urine Culture   Specimen: Urine   UR  Result Value Ref Range   Urine Culture, Routine Final report    Organism ID, Bacteria Comment   Microscopic Examination   Urine  Result Value Ref Range   WBC, UA 0-5 0 - 5 /hpf   RBC, Urine 0-2 0 - 2 /hpf   Epithelial Cells (non renal) 0-10 0 - 10 /hpf   Bacteria, UA None seen None seen/Few   Yeast, UA Present (A) None seen  Urinalysis, Routine w reflex microscopic  Result Value Ref Range   Specific Gravity, UA 1.020 1.005 - 1.030   pH, UA 6.5 5.0 - 7.5   Color, UA Yellow Yellow   Appearance Ur Clear Clear   Leukocytes,UA Negative Negative   Protein,UA 3+ (A) Negative/Trace   Glucose, UA 3+ (A) Negative   Ketones, UA Negative Negative   RBC, UA 1+ (A) Negative   Bilirubin, UA Negative Negative   Urobilinogen, Ur 1.0 0.2 - 1.0 mg/dL   Nitrite, UA Negative Negative   Microscopic Examination See below:       Assessment & Plan:   Problem List Items Addressed This Visit       Cardiovascular and Mediastinum   Hypertension associated with chronic kidney disease due to type 2 diabetes mellitus (HCC)    Chronic, stable. BP at goal for age today and at goal on home readings.  Continue current medication regimen, Lisinopril for kidney protection with diabetes, and collaboration with [facility] nephrology.  Recommend she monitor BP at home at least 3 mornings a  week.  LABS: up to date with nephrology.  Urine ALB 150 May 2024.  Focus on DASH diet at home.        Relevant Medications   FARXIGA 10 MG TABS tablet     Respiratory   Asthma    Chronic, stable on current medication regimen, with minimal use of Albuterol.  No recent exacerbation.  Consider spirometry in future.      Relevant Medications   montelukast (SINGULAIR) 10 MG tablet     Endocrine   Diabetes mellitus with chronic kidney disease (HCC) - Primary    Chronic, ongoing, followed by endocrinology.  A1c 8.4% in October with them, recommend continued focus on diet changes and collaboration with endocrinology.  Urine ALB 150 May 2024, continue ACE for kidney protection.  Continue current medication regimen and collaboration with endocrinology and nephrology.  Will work on obtaining endocrinology notes. - On Statin and ACE - Vaccinations up to date - Eye and Foot exams up to date.      Relevant Medications   FARXIGA 10 MG TABS tablet   Hyperlipidemia associated with type 2 diabetes mellitus (HCC)    Chronic, ongoing.  Continue current medication regimen and adjust as needed.  Lipid panel today.      Relevant Medications   FARXIGA 10 MG TABS tablet   Other Relevant Orders   Lipid Panel w/o Chol/HDL Ratio   Hypothyroidism    S/P thyroidectomy years ago.  Followed by Dr. Dario Guardian with endo.  Continue current medication regimen and adjust as needed.  Will work on getting endocrinology notes.  Thyroid labs up to date.       Relevant Medications   levothyroxine (  SYNTHROID) 88 MCG tablet   Type 2 diabetes mellitus with diabetic neuropathy, with long-term current use of insulin (HCC)    Chronic, ongoing, followed by endocrinology.  A1c 8.4% in October with them, recommend continued focus on diet changes and collaboration with endocrinology.  Urine ALB 150 May 2024, continue ACE for kidney protection.  Continue current medication regimen and collaboration with endocrinology and nephrology.   Will work on obtaining endocrinology notes. - On Statin and ACE - Vaccinations up to date - Eye and Foot exams up to date.      Relevant Medications   FARXIGA 10 MG TABS tablet     Genitourinary   Chronic kidney disease, stage 3a (HCC)    Chronic, ongoing.  Continue collaboration with [facility] nephrology, recent note and labs reviewed.  Lisinopril for kidney protection. Labs up to date with them.  Urine ALB 150 May 2024.        Other   Obesity    BMI 29.22.  Recommended eating smaller high protein, low fat meals more frequently and exercising 30 mins a day 5 times a week with a goal of 10-15lb weight loss in the next 3 months. Patient voiced their understanding and motivation to adhere to these recommendations.       Relevant Medications   FARXIGA 10 MG TABS tablet   Positive ANA (antinuclear antibody)    Noted past labs after complaints of multiple joint pain.  Watchful waiting at this time.  Discussed at length with patient and re educated on reason for monitoring.  Continue this collaboration, recent notes reviewed.          Follow up plan: Return in about 6 months (around 10/24/2023) for T2DM, HTN/HLD, CKD, ANA +.

## 2023-04-25 NOTE — Assessment & Plan Note (Addendum)
Chronic, stable on current medication regimen, with minimal use of Albuterol.  No recent exacerbation.  Consider spirometry in future.

## 2023-04-25 NOTE — Assessment & Plan Note (Signed)
Chronic, ongoing.  Continue collaboration with [facility] nephrology, recent note and labs reviewed.  Lisinopril for kidney protection. Labs up to date with them.  Urine ALB 150 May 2024.

## 2023-04-25 NOTE — Assessment & Plan Note (Signed)
Noted past labs after complaints of multiple joint pain.  Watchful waiting at this time.  Discussed at length with patient and re educated on reason for monitoring.  Continue this collaboration, recent notes reviewed.

## 2023-04-25 NOTE — Assessment & Plan Note (Signed)
BMI 29.22.  Recommended eating smaller high protein, low fat meals more frequently and exercising 30 mins a day 5 times a week with a goal of 10-15lb weight loss in the next 3 months. Patient voiced their understanding and motivation to adhere to these recommendations.

## 2023-04-25 NOTE — Assessment & Plan Note (Signed)
Chronic, ongoing, followed by endocrinology.  A1c 8.4% in October with them, recommend continued focus on diet changes and collaboration with endocrinology.  Urine ALB 150 May 2024, continue ACE for kidney protection.  Continue current medication regimen and collaboration with endocrinology and nephrology.  Will work on obtaining endocrinology notes. - On Statin and ACE - Vaccinations up to date - Eye and Foot exams up to date.

## 2023-04-25 NOTE — Assessment & Plan Note (Addendum)
S/P thyroidectomy years ago.  Followed by Dr. Dario Guardian with endo.  Continue current medication regimen and adjust as needed.  Will work on getting endocrinology notes.  Thyroid labs up to date.

## 2023-04-25 NOTE — Assessment & Plan Note (Signed)
Chronic, ongoing.  Continue current medication regimen and adjust as needed. Lipid panel today. 

## 2023-04-26 LAB — LIPID PANEL W/O CHOL/HDL RATIO
Cholesterol, Total: 137 mg/dL (ref 100–199)
HDL: 44 mg/dL (ref >39)
LDL Chol Calc (NIH): 77 mg/dL (ref 0–99)
Triglycerides: 82 mg/dL (ref 0–149)
VLDL Cholesterol Cal: 16 mg/dL (ref 5–40)

## 2023-04-26 NOTE — Progress Notes (Signed)
Contacted via MyChart  Good afternoon Agapita, your labs have returned and overall lipid panel remains stable.  No changes needed.  Any questions? Keep being amazing!!  Thank you for allowing me to participate in your care.  I appreciate you. Kindest regards, Briley Bumgarner

## 2023-04-26 NOTE — Telephone Encounter (Signed)
Requested Prescriptions  Pending Prescriptions Disp Refills   metoprolol tartrate (LOPRESSOR) 25 MG tablet [Pharmacy Med Name: Metoprolol Tartrate 25 MG Oral Tablet] 180 tablet 0    Sig: Take 1 tablet by mouth twice daily     Cardiovascular:  Beta Blockers Passed - 04/23/2023  6:52 AM      Passed - Last BP in normal range    BP Readings from Last 1 Encounters:  04/25/23 133/70         Passed - Last Heart Rate in normal range    Pulse Readings from Last 1 Encounters:  04/25/23 66         Passed - Valid encounter within last 6 months    Recent Outpatient Visits           Yesterday 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, with long-term current use of insulin (HCC)   [place] [facility] [place], Romeo T, NP   1 month ago Slow transit constipation   [place] [facility] [place], [place] T, NP   2 months ago Dysuria   [place] [facility] [place], Sherran Needs, NP   6 months ago 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, with long-term current use of insulin (HCC)   [place] [facility] [place], Rosalie T, NP   1 year ago Type 2 diabetes mellitus with diabetic neuropathy, with long-term current use of insulin (HCC)   [place] [facility] [place], Dorie Rank, NP       Future Appointments             In 6 months Cannady, Dorie Rank, NP [place] [facility]

## 2023-06-21 ENCOUNTER — Telehealth: Payer: Self-pay

## 2023-06-22 NOTE — Progress Notes (Signed)
Care Guide Pharmacy Note  06/22/2023 Name: Mary Parks MRN: [id number] DOB: [birth year]  Referred By: Marjie Skiff, NP Reason for referral: Care Coordination ([facility] Diabetes. )   Mary Parks is a 73 y.o. year old female who is a primary care patient of Cannady, Dorie Rank, NP.  Mary Parks was referred to the pharmacist for assistance related to: DMII  Successful contact was made with the patient to discuss pharmacy services.  Patient declines engagement at this time. Contact information was provided to the patient should they wish to reach out for assistance at a later time.  [facility]  [facility], [facility] Health Care Management Assistant Direct Dial: [phone]  Fax: [phone]

## 2023-07-08 ENCOUNTER — Other Ambulatory Visit: Payer: Self-pay | Admitting: Nurse Practitioner

## 2023-07-08 DIAGNOSIS — N289 Disorder of kidney and ureter, unspecified: Secondary | ICD-10-CM | POA: Diagnosis not present

## 2023-07-08 DIAGNOSIS — I10 Essential (primary) hypertension: Secondary | ICD-10-CM | POA: Diagnosis not present

## 2023-07-08 DIAGNOSIS — E039 Hypothyroidism, unspecified: Secondary | ICD-10-CM | POA: Diagnosis not present

## 2023-07-08 DIAGNOSIS — T7840XD Allergy, unspecified, subsequent encounter: Secondary | ICD-10-CM | POA: Diagnosis not present

## 2023-07-08 DIAGNOSIS — E1165 Type 2 diabetes mellitus with hyperglycemia: Secondary | ICD-10-CM | POA: Diagnosis not present

## 2023-07-08 DIAGNOSIS — E785 Hyperlipidemia, unspecified: Secondary | ICD-10-CM | POA: Diagnosis not present

## 2023-07-08 NOTE — Telephone Encounter (Signed)
Requested Prescriptions  Pending Prescriptions Disp Refills   rosuvastatin (CRESTOR) 40 MG tablet [Pharmacy Med Name: Rosuvastatin Calcium 40 MG Oral Tablet] 90 tablet 0    Sig: TAKE 1 TABLET BY MOUTH AT BEDTIME     Cardiovascular:  Antilipid - Statins 2 Failed - 07/08/2023  5:00 PM      Failed - Cr in normal range and within 360 days    Creatinine, Ser  Date Value Ref Range Status  10/20/2022 1.31 (H) 0.57 - 1.00 mg/dL Final         Failed - Lipid Panel in normal range within the last 12 months    Cholesterol, Total  Date Value Ref Range Status  04/25/2023 137 100 - 199 mg/dL Final   Cholesterol Piccolo, Waived  Date Value Ref Range Status  07/07/2015 157 <200 mg/dL Final    Comment:                            Desirable                <200                         Borderline High      200- 239                         High                     >239    LDL Chol Calc (NIH)  Date Value Ref Range Status  04/25/2023 77 0 - 99 mg/dL Final   HDL  Date Value Ref Range Status  04/25/2023 44 >39 mg/dL Final   Triglycerides  Date Value Ref Range Status  04/25/2023 82 0 - 149 mg/dL Final   Triglycerides Piccolo,Waived  Date Value Ref Range Status  07/07/2015 135 <150 mg/dL Final    Comment:                            Normal                   <150                         Borderline High     150 - 199                         High                200 - 499                         Very High                >499          Passed - Patient is not pregnant      Passed - Valid encounter within last 12 months    Recent Outpatient Visits           2 months ago 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, with long-term current use of insulin (HCC)   [place] [facility] [place], [place] T, NP   4 months ago Slow transit constipation   [place] [facility] Cannady, Dorie Rank, NP   5 months ago Dysuria   [place]  [facility] [place], Sherran Needs, NP   8 months ago 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, with long-term current use of insulin (HCC)   [place] [facility] [place], [place] T, NP   1 year ago Type 2 diabetes mellitus with diabetic neuropathy, with long-term current use of insulin (HCC)   [place] [facility] [place], Dorie Rank, NP       Future Appointments             In 3 months Cannady, Dorie Rank, NP  [facility]

## 2023-07-11 LAB — LAB REPORT - SCANNED
A1c: 8.4
EGFR: 45
Free T4: 1.4
TSH: 0.58 (ref 0.41–5.90)

## 2023-07-22 ENCOUNTER — Other Ambulatory Visit: Payer: Self-pay | Admitting: Nurse Practitioner

## 2023-07-25 NOTE — Telephone Encounter (Signed)
 Requested Prescriptions  Pending Prescriptions Disp Refills   metoprolol tartrate (LOPRESSOR) 25 MG tablet [Pharmacy Med Name: Metoprolol Tartrate 25 MG Oral Tablet] 180 tablet 0    Sig: Take 1 tablet by mouth twice daily     Cardiovascular:  Beta Blockers Passed - 07/25/2023  9:44 AM      Passed - Last BP in normal range    BP Readings from Last 1 Encounters:  04/25/23 133/70         Passed - Last Heart Rate in normal range    Pulse Readings from Last 1 Encounters:  04/25/23 66         Passed - Valid encounter within last 6 months    Recent Outpatient Visits           3 months ago 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, with long-term current use of insulin (HCC)   [place] [facility] [place], [place] T, NP   4 months ago Slow transit constipation   [place] [facility] [place], [place] T, NP   5 months ago Dysuria   [place] [facility] [place], Sherran Needs, NP   9 months ago 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, with long-term current use of insulin (HCC)   [place] [facility] [place], [place] T, NP   1 year ago Type 2 diabetes mellitus with diabetic neuropathy, with long-term current use of insulin (HCC)   [place] [facility] [place], Dorie Rank, NP       Future Appointments             In 3 months Cannady, Dorie Rank, NP [place] [facility]

## 2023-07-25 NOTE — Telephone Encounter (Signed)
 Requested Prescriptions  Pending Prescriptions Disp Refills   metoprolol tartrate (LOPRESSOR) 25 MG tablet [Pharmacy Med Name: Metoprolol Tartrate 25 MG Oral Tablet] 180 tablet 0    Sig: Take 1 tablet by mouth twice daily     Cardiovascular:  Beta Blockers Passed - 07/25/2023 10:20 AM      Passed - Last BP in normal range    BP Readings from Last 1 Encounters:  04/25/23 133/70         Passed - Last Heart Rate in normal range    Pulse Readings from Last 1 Encounters:  04/25/23 66         Passed - Valid encounter within last 6 months    Recent Outpatient Visits           3 months ago 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, with long-term current use of insulin (HCC)   [place] [facility] [place], [place] T, NP   4 months ago Slow transit constipation   [place] [facility] [place], [place] T, NP   5 months ago Dysuria   [place] [facility] [place], Sherran Needs, NP   9 months ago 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, with long-term current use of insulin (HCC)   [place] [facility] [place], [place] T, NP   1 year ago Type 2 diabetes mellitus with diabetic neuropathy, with long-term current use of insulin (HCC)   [place] [facility] [place], Dorie Rank, NP       Future Appointments             In 3 months Cannady, Dorie Rank, NP [place] [facility]

## 2023-07-28 DIAGNOSIS — I10 Essential (primary) hypertension: Secondary | ICD-10-CM | POA: Diagnosis not present

## 2023-07-28 DIAGNOSIS — N1832 Chronic kidney disease, stage 3b: Secondary | ICD-10-CM | POA: Diagnosis not present

## 2023-07-28 DIAGNOSIS — N281 Cyst of kidney, acquired: Secondary | ICD-10-CM | POA: Diagnosis not present

## 2023-07-28 DIAGNOSIS — N179 Acute kidney failure, unspecified: Secondary | ICD-10-CM | POA: Diagnosis not present

## 2023-07-28 DIAGNOSIS — Z794 Long term (current) use of insulin: Secondary | ICD-10-CM | POA: Diagnosis not present

## 2023-07-28 DIAGNOSIS — E1159 Type 2 diabetes mellitus with other circulatory complications: Secondary | ICD-10-CM | POA: Diagnosis not present

## 2023-07-29 DIAGNOSIS — E1165 Type 2 diabetes mellitus with hyperglycemia: Secondary | ICD-10-CM | POA: Diagnosis not present

## 2023-08-04 DIAGNOSIS — I10 Essential (primary) hypertension: Secondary | ICD-10-CM | POA: Diagnosis not present

## 2023-08-04 DIAGNOSIS — N1832 Chronic kidney disease, stage 3b: Secondary | ICD-10-CM | POA: Diagnosis not present

## 2023-08-04 DIAGNOSIS — E1159 Type 2 diabetes mellitus with other circulatory complications: Secondary | ICD-10-CM | POA: Diagnosis not present

## 2023-08-04 DIAGNOSIS — D631 Anemia in chronic kidney disease: Secondary | ICD-10-CM | POA: Diagnosis not present

## 2023-08-04 DIAGNOSIS — Z794 Long term (current) use of insulin: Secondary | ICD-10-CM | POA: Diagnosis not present

## 2023-08-08 DIAGNOSIS — E1165 Type 2 diabetes mellitus with hyperglycemia: Secondary | ICD-10-CM | POA: Diagnosis not present

## 2023-10-18 DIAGNOSIS — H26492 Other secondary cataract, left eye: Secondary | ICD-10-CM | POA: Diagnosis not present

## 2023-10-18 DIAGNOSIS — Z961 Presence of intraocular lens: Secondary | ICD-10-CM | POA: Diagnosis not present

## 2023-10-18 DIAGNOSIS — H1013 Acute atopic conjunctivitis, bilateral: Secondary | ICD-10-CM | POA: Diagnosis not present

## 2023-10-18 DIAGNOSIS — E119 Type 2 diabetes mellitus without complications: Secondary | ICD-10-CM | POA: Diagnosis not present

## 2023-10-18 LAB — HM DIABETES EYE EXAM

## 2023-10-19 ENCOUNTER — Encounter: Payer: Self-pay | Admitting: Nurse Practitioner

## 2023-10-20 DIAGNOSIS — I10 Essential (primary) hypertension: Secondary | ICD-10-CM | POA: Diagnosis not present

## 2023-10-20 DIAGNOSIS — E559 Vitamin D deficiency, unspecified: Secondary | ICD-10-CM | POA: Diagnosis not present

## 2023-10-20 DIAGNOSIS — E039 Hypothyroidism, unspecified: Secondary | ICD-10-CM | POA: Diagnosis not present

## 2023-10-20 DIAGNOSIS — N189 Chronic kidney disease, unspecified: Secondary | ICD-10-CM | POA: Diagnosis not present

## 2023-10-20 DIAGNOSIS — E1165 Type 2 diabetes mellitus with hyperglycemia: Secondary | ICD-10-CM | POA: Diagnosis not present

## 2023-10-20 DIAGNOSIS — E785 Hyperlipidemia, unspecified: Secondary | ICD-10-CM | POA: Diagnosis not present

## 2023-10-21 DIAGNOSIS — E785 Hyperlipidemia, unspecified: Secondary | ICD-10-CM | POA: Diagnosis not present

## 2023-10-21 DIAGNOSIS — N189 Chronic kidney disease, unspecified: Secondary | ICD-10-CM | POA: Diagnosis not present

## 2023-10-21 DIAGNOSIS — E559 Vitamin D deficiency, unspecified: Secondary | ICD-10-CM | POA: Diagnosis not present

## 2023-10-21 DIAGNOSIS — E1165 Type 2 diabetes mellitus with hyperglycemia: Secondary | ICD-10-CM | POA: Diagnosis not present

## 2023-10-21 DIAGNOSIS — E039 Hypothyroidism, unspecified: Secondary | ICD-10-CM | POA: Diagnosis not present

## 2023-10-21 LAB — LAB REPORT - SCANNED
A1c: 8.4
EGFR: 32

## 2023-10-22 DIAGNOSIS — K869 Disease of pancreas, unspecified: Secondary | ICD-10-CM | POA: Insufficient documentation

## 2023-10-22 LAB — BASIC METABOLIC PANEL  EGFR
Creat: 1.58
eGFR: 34

## 2023-10-22 LAB — HEMOGLOBIN A1C: Hemoglobin-A1c: 7.6

## 2023-10-22 NOTE — Patient Instructions (Signed)
Be Involved in Caring For Your Health:  Taking Medications When medications are taken as directed, they can greatly improve your health. But if they are not taken as prescribed, they may not work. In some cases, not taking them correctly can be harmful. To help ensure your treatment remains effective and safe, understand your medications and how to take them. Bring your medications to each visit for review by your provider.  Your lab results, notes, and after visit summary will be available on My Chart. We strongly encourage you to use this feature. If lab results are abnormal the clinic will contact you with the appropriate steps. If the clinic does not contact you assume the results are satisfactory. You can always view your results on My Chart. If you have questions regarding your health or results, please contact the clinic during office hours. You can also ask questions on My Chart.  We at [facility] are grateful that you chose [place] to provide your care. We strive to provide evidence-based and compassionate care and are always looking for feedback. If you get a survey from the clinic please complete this so we can hear your opinions.  Diabetes Mellitus and Exercise Regular exercise is important for your health, especially if you have diabetes mellitus. Exercise is not just about losing weight. It can also help you increase muscle strength and bone density and reduce body fat and stress. This can help your level of endurance and make you more fit and flexible. Why should I exercise if I have diabetes? Exercise has many benefits for people with diabetes. It can: Help lower and control your blood sugar (glucose). Help your body respond better and become more sensitive to the hormone insulin. Reduce how much insulin your body needs. Lower your risk for heart disease by: Lowering how much "bad" cholesterol and triglycerides you have in your body. Increasing how much "good" cholesterol  you have in your body. Lowering your blood pressure. Lowering your blood glucose levels. What is my activity plan? Your health care provider or an expert trained in diabetes care (certified diabetes educator) can help you make an activity plan. This plan can help you find the type of exercise that works for you. It may also tell you how often to exercise and for how long. Be sure to: Get at least 150 minutes of medium-intensity or high-intensity exercise each week. This may involve brisk walking, biking, or water aerobics. Do stretching and strengthening exercises at least 2 times a week. This may involve yoga or weight lifting. Spread out your activity over at least 3 days of the week. Get some form of physical activity each day. Do not go more than 2 days in a row without some kind of activity. Avoid being inactive for more than 30 minutes at a time. Take frequent breaks to walk or stretch. Choose activities that you enjoy. Set goals that you know you can accomplish. Start slowly and increase the intensity of your exercise over time. How do I manage my diabetes during exercise?  Monitor your blood glucose Check your blood glucose before and after you exercise. If your blood glucose is 240 mg/dL (40.9 mmol/L) or higher before you exercise, check your urine for ketones. These are chemicals created by the liver. If you have ketones in your urine, do not exercise until your blood glucose returns to normal. If your blood glucose is 100 mg/dL (5.6 mmol/L) or lower, eat a snack that has 15-20 grams of carbohydrate in  it. Check your blood glucose 15 minutes after the snack to make sure that your level is above 100 mg/dL (5.6 mmol/L) before you start to exercise. Your risk for low blood glucose (hypoglycemia) goes up during and after exercise. Know the symptoms of this condition and how to treat it. Follow these instructions at home: Keep a carbohydrate snack on hand for use before, during, and after  exercise. This can help prevent or treat hypoglycemia. Avoid injecting insulin into parts of your body that are going to be used during exercise. This may include: Your arms, when you are going to play tennis. Your legs, when you are about to go jogging. Keep track of your exercise habits. This can help you and your health care provider watch and adjust your activity plan. Write down: What you eat before and after you exercise. Blood glucose levels before and after you exercise. The type and amount of exercise you do. Talk to your health care provider before you start a new activity. They may need to: Make sure that the activity is safe for you. Adjust your insulin, other medicines, and food that you eat. Drink water while you exercise. This can stop you from losing too much water (dehydration). It can also prevent problems caused by having a lot of heat in your body (heat stroke). Where to find more information American Diabetes Association: diabetes.org Association of Diabetes Care & Education Specialists: diabeteseducator.org This information is not intended to replace advice given to you by your health care provider. Make sure you discuss any questions you have with your health care provider. Document Revised: 10/28/2021 Document Reviewed: 10/28/2021 Elsevier Patient Education  2024 [employer].

## 2023-10-26 ENCOUNTER — Encounter: Payer: Self-pay | Admitting: Nurse Practitioner

## 2023-10-26 ENCOUNTER — Ambulatory Visit (INDEPENDENT_AMBULATORY_CARE_PROVIDER_SITE_OTHER): Payer: Self-pay | Admitting: Nurse Practitioner

## 2023-10-26 VITALS — BP 112/63 | HR 73 | Temp 97.6°F | Ht 64.5 in | Wt 170.0 lb

## 2023-10-26 DIAGNOSIS — E1140 Type 2 diabetes mellitus with diabetic neuropathy, unspecified: Secondary | ICD-10-CM | POA: Diagnosis not present

## 2023-10-26 DIAGNOSIS — E1169 Type 2 diabetes mellitus with other specified complication: Secondary | ICD-10-CM | POA: Diagnosis not present

## 2023-10-26 DIAGNOSIS — E0822 Diabetes mellitus due to underlying condition with diabetic chronic kidney disease: Secondary | ICD-10-CM | POA: Diagnosis not present

## 2023-10-26 DIAGNOSIS — E785 Hyperlipidemia, unspecified: Secondary | ICD-10-CM | POA: Diagnosis not present

## 2023-10-26 DIAGNOSIS — N1831 Chronic kidney disease, stage 3a: Secondary | ICD-10-CM | POA: Diagnosis not present

## 2023-10-26 DIAGNOSIS — I129 Hypertensive chronic kidney disease with stage 1 through stage 4 chronic kidney disease, or unspecified chronic kidney disease: Secondary | ICD-10-CM

## 2023-10-26 DIAGNOSIS — K869 Disease of pancreas, unspecified: Secondary | ICD-10-CM

## 2023-10-26 DIAGNOSIS — E1122 Type 2 diabetes mellitus with diabetic chronic kidney disease: Secondary | ICD-10-CM | POA: Diagnosis not present

## 2023-10-26 DIAGNOSIS — Z794 Long term (current) use of insulin: Secondary | ICD-10-CM

## 2023-10-26 DIAGNOSIS — K219 Gastro-esophageal reflux disease without esophagitis: Secondary | ICD-10-CM

## 2023-10-26 DIAGNOSIS — E66811 Obesity, class 1: Secondary | ICD-10-CM

## 2023-10-26 DIAGNOSIS — J4520 Mild intermittent asthma, uncomplicated: Secondary | ICD-10-CM | POA: Diagnosis not present

## 2023-10-26 DIAGNOSIS — E890 Postprocedural hypothyroidism: Secondary | ICD-10-CM | POA: Diagnosis not present

## 2023-10-26 LAB — MICROALBUMIN, URINE WAIVED
Creatinine, Urine Waived: 100 mg/dL (ref 10–300)
Microalb, Ur Waived: 150 mg/L — ABNORMAL HIGH (ref 0–19)
Microalb/Creat Ratio: >300 mg/g — ABNORMAL HIGH (ref <30)

## 2023-10-26 LAB — BAYER DCA HB A1C WAIVED: HB A1C (BAYER DCA - WAIVED): 8.3 % — ABNORMAL HIGH (ref 4.8–5.6)

## 2023-10-26 NOTE — Assessment & Plan Note (Signed)
Chronic and ongoing issue.  Continue Protonix and adjust as needed.  Plan on checking Mag level annually.  If worsening or ongoing consider referral to GI. Risks of PPI use were discussed with patient including bone loss, C. Diff diarrhea, pneumonia, infections, CKD, electrolyte abnormalities.  Verbalizes understanding and chooses to continue the medication.  

## 2023-10-26 NOTE — Assessment & Plan Note (Signed)
 Check Amylase and Lipase today, she is to notify PCP if nephrology does not order repeat imaging for August.  If they do not order, will place order to reassess per recommendations.  Due around 01/21/24.

## 2023-10-26 NOTE — Assessment & Plan Note (Signed)
 Chronic, ongoing, followed by endocrinology.  A1c 8.3% today in office, slight trend up.  Urine ALB 150 June 2025, continue ACE for kidney protection.  Endocrinologist is retiring and she would like to stay with PCP at this time for diabetes care. - Will continue Farxiga  at current dose + continue Trulicity  at max dose for now, however she will alert PCP when near end of her current pens, then plan to switch over to Mounjaro which may offer better control and assist in reduction of appetite.  For now increase Lantus  to 45 units BID + continue Humalog  at current dosing. - On Statin and ACE - Vaccinations up to date - Eye and Foot exams up to date.

## 2023-10-26 NOTE — Assessment & Plan Note (Signed)
 BMI 28.73.  Recommended eating smaller high protein, low fat meals more frequently and exercising 30 mins a day 5 times a week with a goal of 10-15lb weight loss in the next 3 months. Patient voiced their understanding and motivation to adhere to these recommendations.

## 2023-10-26 NOTE — Assessment & Plan Note (Signed)
 Chronic, ongoing.  Continue collaboration with [facility] nephrology, recent note and labs reviewed.  Lisinopril  for kidney protection. Labs up to date with them.  Urine ALB 150 June 2025.

## 2023-10-26 NOTE — Assessment & Plan Note (Signed)
 Chronic, ongoing.  Continue current medication regimen and adjust as needed. Lipid panel today.

## 2023-10-26 NOTE — Assessment & Plan Note (Signed)
 Chronic, stable. BP at goal for age today and at goal on home readings.  Continue current medication regimen, Lisinopril  for kidney protection with diabetes, and collaboration with [facility] nephrology.  Recommend she monitor BP at home at least 3 mornings a week.  LABS: up to date with nephrology.  Urine ALB 150 June 2025.  Focus on DASH diet at home.

## 2023-10-26 NOTE — Progress Notes (Signed)
 BP 112/63   Pulse 73   Temp 97.6 F (36.4 C) (Oral)   Ht 5' 4.5" (1.638 m)   Wt 170 lb (77.1 kg)   LMP  (LMP Unknown)   SpO2 98%   BMI 28.73 kg/m    Subjective:    Patient ID: Mary Parks, female    DOB: 24-Oct-1950, 74 y.o.   MRN: [id number]  HPI: Mary Parks is a 73 y.o. female  Chief Complaint  Patient presents with   Chronic Kidney Disease   Diabetes   Hyperlipidemia   Hypertension   DIABETES Continues to follow with Dr. Jadali for diabetes and thyroid .  Last visit appears to have been on 07/08/23. A1c in March was 7.6%. Dr. Jadali is retiring and closing practice in July.  Continues on Trulicity  4.5 MG weekly (has 3 months worth of pens left), Lantus  (40 units morning and 40 units at night), Humalog  10 in morning, 10 lunch, and 6 in evening + Farxiga  10 MG. A1c has remained in 8-9 range.  Wears Dexcom. 90 days average glucose 211, 36% in range, 33% high and 29% very high, 1% low.  Pancreas lesion noted on imaging done by [facility] nephrology on 01/20/22, recommended to repeat in 2 years. Hypoglycemic episodes: none Polydipsia/polyuria: no Visual disturbance: no Chest pain: no Paresthesias: no Glucose Monitoring: yes             Accucheck frequency: Dexcom as above             Fasting glucose:              Post prandial:             Evening:              Before meals: Taking Insulin ?: yes             Long acting insulin : 40 units morning and 40 units at night             Short acting insulin : 10 in morning, 10 lunch, and 6 in evening  Blood Pressure Monitoring: a few times a week Retinal Examination: Up To Date  Foot Exam: Up to Date Pneumovax: Up to Date Influenza: Up to Date Aspirin : yes  ASTHMA Using Symbicort  and Albuterol  + Singulair . Continues on Protonix  for GERD. Asthma status: stable Satisfied with current treatment?: yes Albuterol /rescue inhaler frequency: rarely Dyspnea frequency: no Wheezing frequency: no Cough frequency: none Nocturnal  symptom frequency: none Limitation of activity: no Current upper respiratory symptoms: no Aerochamber/spacer use: no Visits to ER or Urgent Care in past year: no Pneumovax: Up to Date Influenza: Up to Date   HYPOTHYROIDISM Taking 33 MCG Levothyroxine .   Continues to follow with rheumatology, last visit 01/28/23.  Watchful waiting. Thyroid  control status:stable Satisfied with current treatment? yes Medication side effects: no Medication compliance: good compliance Etiology of hypothyroidism:  Recent dose adjustment:no Fatigue: no Cold intolerance: no Heat intolerance: no Weight gain: no Weight loss: no Constipation: at baseline -- Metamucil gummies + drinks Milk of Mag Diarrhea/loose stools: no Palpitations: no Lower extremity edema: no Anxiety/depressed mood: no   CHRONIC KIDNEY DISEASE Saw nephrology at [facility] last on 08/04/23, returns next week. CKD status: stable Medications renally dose: yes Previous renal evaluation: yes Pneumovax:  Up to Date Influenza Vaccine:  Up to Date  HYPERTENSION / HYPERLIPIDEMIA Current medications include Metoprolol , Lisinopril  + Crestor  for HLD.   Satisfied with current treatment? yes Duration of hypertension: chronic BP monitoring frequency: weekly BP  range: 120/70 range at home BP medication side effects: no Duration of hyperlipidemia: chronic Cholesterol medication side effects: no Cholesterol supplements: none Medication compliance: good compliance Aspirin : yes Recent stressors: no Recurrent headaches: no Visual changes: no Palpitations: no Dyspnea: no Chest pain: no Lower extremity edema: no Dizzy/lightheaded: occasional with vertigo, Meclizine  helps  Relevant past medical, surgical, family and social history reviewed and updated as indicated. Interim medical history since our last visit reviewed. Allergies and medications reviewed and updated.  Review of Systems  Constitutional:  Negative for activity change, appetite  change, diaphoresis, fatigue and fever.  Respiratory:  Negative for cough, chest tightness and shortness of breath.   Cardiovascular:  Negative for chest pain, palpitations and leg swelling.  Gastrointestinal: Negative.   Endocrine: Negative for cold intolerance, heat intolerance, polydipsia, polyphagia and polyuria.  Neurological: Negative.   Psychiatric/Behavioral: Negative.     Per HPI unless specifically indicated above     Objective:    BP 112/63   Pulse 73   Temp 97.6 F (36.4 C) (Oral)   Ht 5' 4.5" (1.638 m)   Wt 170 lb (77.1 kg)   LMP  (LMP Unknown)   SpO2 98%   BMI 28.73 kg/m   Wt Readings from Last 3 Encounters:  10/26/23 170 lb (77.1 kg)  04/25/23 174 lb (78.9 kg)  03/08/23 173 lb (78.5 kg)    Physical Exam Vitals and nursing note reviewed.  Constitutional:      General: She is awake. She is not in acute distress.    Appearance: She is well-developed and well-groomed. She is obese. She is not ill-appearing.  HENT:     Head: Normocephalic.     Right Ear: Hearing and external ear normal. No drainage.     Left Ear: Hearing and external ear normal. No drainage.  Eyes:     General: Lids are normal.        Right eye: No discharge.        Left eye: No discharge.     Conjunctiva/sclera: Conjunctivae normal.     Pupils: Pupils are equal, round, and reactive to light.  Neck:     Vascular: No carotid bruit.  Cardiovascular:     Rate and Rhythm: Normal rate and regular rhythm.     Heart sounds: Normal heart sounds.  Pulmonary:     Effort: Pulmonary effort is normal. No accessory muscle usage or respiratory distress.     Breath sounds: Normal breath sounds.  Abdominal:     General: Bowel sounds are normal. There is no distension.     Palpations: Abdomen is soft.     Tenderness: There is no abdominal tenderness.  Musculoskeletal:     Cervical back: Normal range of motion and neck supple.     Right lower leg: No edema.     Left lower leg: No edema.   Lymphadenopathy:     Cervical: No cervical adenopathy.  Skin:    General: Skin is warm and dry.  Neurological:     Mental Status: She is alert and oriented to person, place, and time.     Motor: Motor function is intact.     Coordination: Coordination is intact.     Gait: Gait is intact.     Deep Tendon Reflexes:     Reflex Scores:      Brachioradialis reflexes are 2+ on the right side and 2+ on the left side.      Patellar reflexes are 2+ on the right side and  2+ on the left side. Psychiatric:        Attention and Perception: Attention normal.        Mood and Affect: Mood normal.        Speech: Speech normal.        Behavior: Behavior normal. Behavior is cooperative.        Thought Content: Thought content normal.    Diabetic Foot Exam - Simple   Simple Foot Form Visual Inspection See comments: Yes Sensation Testing Intact to touch and monofilament testing bilaterally: Yes Pulse Check Posterior Tibialis and Dorsalis pulse intact bilaterally: Yes Comments Dry skin     Results for orders placed or performed in visit on 10/26/23  Hemoglobin A1c   Collection Time: 07/28/23 12:00 AM  Result Value Ref Range   Hemoglobin-A1c 7.6%   Basic Metabolic Panel  EGFR   Collection Time: 07/28/23 12:00 AM  Result Value Ref Range   eGFR 34    Creat 1.58       Assessment & Plan:   Problem List Items Addressed This Visit       Cardiovascular and Mediastinum   Hypertension associated with chronic kidney disease due to type 2 diabetes mellitus (HCC)   Chronic, stable. BP at goal for age today and at goal on home readings.  Continue current medication regimen, Lisinopril  for kidney protection with diabetes, and collaboration with [facility] nephrology.  Recommend she monitor BP at home at least 3 mornings a week.  LABS: up to date with nephrology.  Urine ALB 150 June 2025.  Focus on DASH diet at home.        Relevant Medications   TRULICITY  4.5 MG/0.5ML SOAJ   Other Relevant Orders    Microalbumin, Urine Waived   Bayer DCA Hb A1c Waived     Respiratory   Asthma   Chronic, stable on current medication regimen, with minimal use of Albuterol .  No recent exacerbation.  Consider spirometry in future.        Digestive   Pancreatic lesion   Check Amylase and Lipase today, she is to notify PCP if nephrology does not order repeat imaging for August.  If they do not order, will place order to reassess per recommendations.  Due around 01/21/24.      Relevant Orders   Lipase   Amylase   GERD (gastroesophageal reflux disease)   Chronic and ongoing issue.  Continue Protonix  and adjust as needed.  Plan on checking Mag level annually.  If worsening or ongoing consider referral to GI. Risks of PPI use were discussed with patient including bone loss, C. Diff diarrhea, pneumonia, infections, CKD, electrolyte abnormalities.  Verbalizes understanding and chooses to continue the medication.       Relevant Orders   Magnesium      Endocrine   Type 2 diabetes mellitus with diabetic neuropathy, with long-term current use of insulin  (HCC) - Primary   Chronic, ongoing, followed by endocrinology.  A1c 8.3% today in office, slight trend up.  Urine ALB 150 June 2025, continue ACE for kidney protection.  Endocrinologist is retiring and she would like to stay with PCP at this time for diabetes care. - Will continue Farxiga  at current dose + continue Trulicity  at max dose for now, however she will alert PCP when near end of her current pens, then plan to switch over to Mounjaro which may offer better control and assist in reduction of appetite.  For now increase Lantus  to 45 units BID + continue Humalog  at current  dosing. - On Statin and ACE - Vaccinations up to date - Eye and Foot exams up to date.      Relevant Medications   TRULICITY  4.5 MG/0.5ML SOAJ   Other Relevant Orders   Microalbumin, Urine Waived   Bayer DCA Hb A1c Waived   Hypothyroidism   S/P thyroidectomy years ago.  Continue  current medication regimen and adjust as needed. Thyroid  labs obtained today.       Relevant Medications   levothyroxine  (SYNTHROID ) 75 MCG tablet   Other Relevant Orders   TSH   T4, free   Hyperlipidemia associated with type 2 diabetes mellitus (HCC)   Chronic, ongoing.  Continue current medication regimen and adjust as needed.  Lipid panel today.      Relevant Medications   TRULICITY  4.5 MG/0.5ML SOAJ   Other Relevant Orders   Bayer DCA Hb A1c Waived   Lipid Panel w/o Chol/HDL Ratio   Diabetes mellitus with chronic kidney disease (HCC)   Chronic, ongoing, followed by endocrinology.  A1c 8.3% today in office, slight trend up.  Urine ALB 150 June 2025, continue ACE for kidney protection.  Endocrinologist is retiring and she would like to stay with PCP at this time for diabetes care. - Will continue Farxiga  at current dose + continue Trulicity  at max dose for now, however she will alert PCP when near end of her current pens, then plan to switch over to Mounjaro which may offer better control and assist in reduction of appetite.  For now increase Lantus  to 45 units BID + continue Humalog  at current dosing. - On Statin and ACE - Vaccinations up to date - Eye and Foot exams up to date.      Relevant Medications   TRULICITY  4.5 MG/0.5ML SOAJ   Other Relevant Orders   Microalbumin, Urine Waived   Bayer DCA Hb A1c Waived     Genitourinary   Chronic kidney disease, stage 3a (HCC)   Chronic, ongoing.  Continue collaboration with [facility] nephrology, recent note and labs reviewed.  Lisinopril  for kidney protection. Labs up to date with them.  Urine ALB 150 June 2025.        Other   Obesity   BMI 28.73.  Recommended eating smaller high protein, low fat meals more frequently and exercising 30 mins a day 5 times a week with a goal of 10-15lb weight loss in the next 3 months. Patient voiced their understanding and motivation to adhere to these recommendations.       Relevant Medications    TRULICITY  4.5 MG/0.5ML SOAJ     Follow up plan: Return in about 3 months (around 01/26/2024) for T2DM, HTN/HLD, ASTHMA, CKD.

## 2023-10-26 NOTE — Assessment & Plan Note (Signed)
 Chronic, stable on current medication regimen, with minimal use of Albuterol.  No recent exacerbation.  Consider spirometry in future.

## 2023-10-26 NOTE — Assessment & Plan Note (Addendum)
 S/P thyroidectomy years ago.  Continue current medication regimen and adjust as needed. Thyroid  labs obtained today.

## 2023-10-27 ENCOUNTER — Ambulatory Visit: Payer: Self-pay | Admitting: Nurse Practitioner

## 2023-10-27 LAB — LIPASE: Lipase: 35 U/L (ref 14–85)

## 2023-10-27 LAB — AMYLASE: Amylase: 99 U/L (ref 31–110)

## 2023-10-27 LAB — LIPID PANEL W/O CHOL/HDL RATIO
Cholesterol, Total: 133 mg/dL (ref 100–199)
HDL: 44 mg/dL (ref >39)
LDL Chol Calc (NIH): 72 mg/dL (ref 0–99)
Triglycerides: 86 mg/dL (ref 0–149)
VLDL Cholesterol Cal: 17 mg/dL (ref 5–40)

## 2023-10-27 LAB — T4, FREE: Free T4: 1.44 ng/dL (ref 0.82–1.77)

## 2023-10-27 LAB — TSH: TSH: 1.08 u[IU]/mL (ref 0.450–4.500)

## 2023-10-27 LAB — MAGNESIUM: Magnesium: 2.3 mg/dL (ref 1.6–2.3)

## 2023-10-27 NOTE — Progress Notes (Signed)
 Contacted via MyChart   Good morning Darnice, your labs have returned and overall remain stable.  Waiting on a few more labs to return and if abnormal I will let you know. At this time no changes needed.  Any questions? Keep being stellar!!  Thank you for allowing me to participate in your care.  I appreciate you. Kindest regards, Elyssia Strausser

## 2023-10-28 DIAGNOSIS — E1165 Type 2 diabetes mellitus with hyperglycemia: Secondary | ICD-10-CM | POA: Diagnosis not present

## 2023-11-03 DIAGNOSIS — I10 Essential (primary) hypertension: Secondary | ICD-10-CM | POA: Diagnosis not present

## 2023-11-03 DIAGNOSIS — Z794 Long term (current) use of insulin: Secondary | ICD-10-CM | POA: Diagnosis not present

## 2023-11-03 DIAGNOSIS — D631 Anemia in chronic kidney disease: Secondary | ICD-10-CM | POA: Diagnosis not present

## 2023-11-03 DIAGNOSIS — N179 Acute kidney failure, unspecified: Secondary | ICD-10-CM | POA: Diagnosis not present

## 2023-11-03 DIAGNOSIS — E1159 Type 2 diabetes mellitus with other circulatory complications: Secondary | ICD-10-CM | POA: Diagnosis not present

## 2023-11-03 DIAGNOSIS — N1832 Chronic kidney disease, stage 3b: Secondary | ICD-10-CM | POA: Diagnosis not present

## 2023-11-07 DIAGNOSIS — N1832 Chronic kidney disease, stage 3b: Secondary | ICD-10-CM | POA: Diagnosis not present

## 2023-11-07 DIAGNOSIS — R809 Proteinuria, unspecified: Secondary | ICD-10-CM | POA: Diagnosis not present

## 2023-11-07 DIAGNOSIS — Z794 Long term (current) use of insulin: Secondary | ICD-10-CM | POA: Diagnosis not present

## 2023-11-07 DIAGNOSIS — I10 Essential (primary) hypertension: Secondary | ICD-10-CM | POA: Diagnosis not present

## 2023-11-07 DIAGNOSIS — E1159 Type 2 diabetes mellitus with other circulatory complications: Secondary | ICD-10-CM | POA: Diagnosis not present

## 2023-11-13 ENCOUNTER — Ambulatory Visit
Admission: EM | Admit: 2023-11-13 | Discharge: 2023-11-13 | Disposition: A | Discharge status: Home / Self Care | Attending: Physician Assistant | Admitting: Physician Assistant

## 2023-11-13 ENCOUNTER — Other Ambulatory Visit: Payer: Self-pay | Admitting: Nurse Practitioner

## 2023-11-13 ENCOUNTER — Encounter: Payer: Self-pay | Admitting: Emergency Medicine

## 2023-11-13 DIAGNOSIS — J019 Acute sinusitis, unspecified: Secondary | ICD-10-CM | POA: Diagnosis not present

## 2023-11-13 DIAGNOSIS — J309 Allergic rhinitis, unspecified: Secondary | ICD-10-CM

## 2023-11-13 MED ORDER — AMOXICILLIN-POT CLAVULANATE 875-125 MG PO TABS: 1.0000 | ORAL_TABLET | Freq: Two times a day (BID) | ORAL | 0 refills | Status: AC

## 2023-11-13 NOTE — ED Triage Notes (Signed)
Patient c/o sinus congestion and pressure for a week.  Patient denies fevers.

## 2023-11-13 NOTE — ED Provider Notes (Signed)
 [facility]    CSN: [id number] Arrival date & time: 11/13/23  1514      History   Chief Complaint Chief Complaint  Patient presents with   Sinus Problem    HPI Mary Parks is a 73 y.o. female with history of asthma, diabetes, GERD, hypothyroid, hypertension, hyperlipidemia, obesity, and vertigo.  Patient presents today for greater than 1 week history of nasal congestion, sinus pressure, headaches, runny nose, red and itchy eyes with watery drainage.  States nasal congestion is mostly clear.  She denies fever, cough, shortness of breath, chest pain.  Patient does have history of allergies and has been taking Zyrtec and using Flonase  but says her symptoms are not getting better and she is feeling worse.  She believes she has a sinus infection.  HPI  Past Medical History:  Diagnosis Date   Asthma    Chest pain    DM2 (diabetes mellitus, type 2) (HCC) 1987   Dyspnea    Cath normal coronaries. normal EF. RA 11. PA 37/17 (26) LVEDP 36   GERD (gastroesophageal reflux disease)    HTN (hypertension)    Hyperlipidemia    Hypothyroidism    Obesity    Osteoarthritis    knees   Renal disease    Thyroid  disease    having thyroid  removed 04/14/15   Vertigo    none recently    Patient Active Problem List   Diagnosis Date Noted   Pancreatic lesion 10/22/2023   Positive ANA (antinuclear antibody) 10/31/2021   History of colonic polyps    Polyp of descending colon    Seborrheic dermatitis 06/05/2021   GERD (gastroesophageal reflux disease) 08/13/2020   Slow transit constipation 07/16/2020   Hypothyroidism 04/06/2020   Obesity 10/03/2019   Hyperlipidemia associated with type 2 diabetes mellitus (HCC) 06/29/2017   Benign neoplasm of ascending colon    Benign neoplasm of descending colon    Allergic rhinitis 09/22/2016   OA (osteoarthritis) of knee 06/25/2016   Type 2 diabetes mellitus with diabetic neuropathy, with long-term current use of insulin  (HCC) 10/03/2015    Chronic kidney disease, stage 3a (HCC) 12/17/2014   Hypertension associated with chronic kidney disease due to type 2 diabetes mellitus (HCC) 12/17/2014   Diabetes mellitus with chronic kidney disease (HCC) 11/28/2014   Asthma 11/19/2014    Past Surgical History:  Procedure Laterality Date   CARDIAC CATHETERIZATION     CATARACT EXTRACTION W/PHACO Right 03/31/2015   Procedure: CATARACT EXTRACTION PHACO AND INTRAOCULAR LENS PLACEMENT (IOC);  Surgeon: Donzell Arlyce Budd, MD;  Location: [facility];  Service: Ophthalmology;  Laterality: Right;  DIABETIC - insulin    CATARACT EXTRACTION W/PHACO Left 08/31/2021   Procedure: CATARACT EXTRACTION PHACO AND INTRAOCULAR LENS PLACEMENT (IOC) LEFT DIABETIC 7.54 01:00.2;  Surgeon: Myrna Adine Anes, MD;  Location: [facility];  Service: Ophthalmology;  Laterality: Left;  Diabetic   COLONOSCOPY WITH PROPOFOL  N/A 11/12/2016   Procedure: COLONOSCOPY WITH PROPOFOL ;  Surgeon: Jinny Carmine, MD;  Location: [facility];  Service: Endoscopy;  Laterality: N/A;  Diabetic - insulin    COLONOSCOPY WITH PROPOFOL  N/A 06/15/2021   Procedure: COLONOSCOPY WITH BIOPSY;  Surgeon: Jinny Carmine, MD;  Location: [facility];  Service: Endoscopy;  Laterality: N/A;  Diabetic   EYE SURGERY     HERNIA REPAIR     umbilical   POLYPECTOMY  11/12/2016   Procedure: POLYPECTOMY INTESTINAL;  Surgeon: Jinny Carmine, MD;  Location: [facility];  Service: Endoscopy;;   POLYPECTOMY N/A 06/15/2021  Procedure: POLYPECTOMY;  Surgeon: Jinny Carmine, MD;  Location: [facility];  Service: Endoscopy;  Laterality: N/A;   THYROIDECTOMY N/A 04/14/2015   Procedure: THYROIDECTOMY;  Surgeon: Deward Argue, MD;  Location: [facility];  Service: ENT;  Laterality: N/A;    OB History   No obstetric history on file.      Home Medications    Prior to Admission medications   Medication Sig Start Date End Date Taking? Authorizing Provider   amoxicillin -clavulanate (AUGMENTIN ) 875-125 MG tablet Take 1 tablet by mouth every 12 (twelve) hours for 7 days. 11/13/23 11/20/23 Yes Arvis Jolan NOVAK, PA-C  albuterol  (VENTOLIN  HFA) 108 (90 Base) MCG/ACT inhaler INHALE 2 PUFFS BY MOUTH EVERY 6 HOURS AS NEEDED FOR WHEEZING AND  OR  SHORTNESS  OF  BREATH 12/21/22   Cannady, Jolene T, NP  aspirin  EC 81 MG tablet Take 81 mg by mouth daily.    [provider]  budesonide -formoterol  (SYMBICORT ) 160-4.5 MCG/ACT inhaler Inhale 2 puffs into the lungs 2 (two) times daily. 10/20/22   Cannady, Jolene T, NP  calcium -vitamin D  (OSCAL WITH D) 500-200 MG-UNIT tablet Take 1 tablet by mouth.    [provider]  FARXIGA  10 MG TABS tablet Take 1 tablet (10 mg total) by mouth daily. 04/25/23   Cannady, Jolene T, NP  Finerenone 10 MG TABS Take 10 mg by mouth daily. 08/19/23 08/18/24  [provider]  fluticasone  (FLONASE ) 50 MCG/ACT nasal spray Use 2 spray(s) in each nostril once daily 04/20/23   Cannady, Jolene T, NP  insulin  lispro (HUMALOG ) 100 UNIT/ML KwikPen INJECT 10 UNITS SUBCTUANEOUSLY BEFORE BREAKFAST AND LUNCH, AND 6 UNITS BEFORE EVENING MEAL. 10/20/22   Cannady, Jolene T, NP  LANTUS  SOLOSTAR 100 UNIT/ML Solostar Pen INJECT 45 UNITS SUBCUTANEOUSLY IN THE MORNING AND 40 UNITS AT NIGHT 03/08/23   Cannady, Jolene T, NP  levothyroxine  (SYNTHROID ) 75 MCG tablet Take 75 mcg by mouth every morning. 07/12/23   [provider]  lisinopril  (ZESTRIL ) 40 MG tablet Take 40 mg by mouth daily. 01/07/23   [provider]  meclizine  (ANTIVERT ) 12.5 MG tablet Take 1 tablet (12.5 mg total) by mouth 3 (three) times daily as needed for dizziness. 04/25/23   Cannady, Jolene T, NP  metoprolol  tartrate (LOPRESSOR ) 25 MG tablet Take 1 tablet by mouth twice daily 07/25/23   Cannady, Jolene T, NP  montelukast  (SINGULAIR ) 10 MG tablet Take 1 tablet (10 mg total) by mouth at bedtime. 04/25/23   Cannady, Jolene T, NP  pantoprazole  (PROTONIX ) 40 MG tablet Take  1 tablet (40 mg total) by mouth daily. 04/25/23   Cannady, Jolene T, NP  rosuvastatin  (CRESTOR ) 40 MG tablet TAKE 1 TABLET BY MOUTH AT BEDTIME 07/08/23   Cannady, Jolene T, NP  TRULICITY  4.5 MG/0.5ML SOAJ Inject 4.5 mg into the skin once a week.    [provider]    Family History Family History  Problem Relation Age of Onset   Diabetes Mother    Diabetes Father    Diabetes Sister    Diabetes Brother    Diabetes Sister    Diabetes Sister    Diabetes Maternal Grandmother    Diabetes Maternal Grandfather    Diabetes Paternal Grandmother    Diabetes Paternal Grandfather    Heart disease Neg Hx    Coronary artery disease Neg Hx    Breast cancer Neg Hx     Social History Social History   Tobacco Use   Smoking status: Never   Smokeless tobacco:  Never  Vaping Use   Vaping status: Never Used  Substance Use Topics   Alcohol use: No   Drug use: No     Allergies   Celebrex [celecoxib], Sulfa antibiotics, Elemental sulfur, and Sulfisoxazole   Review of Systems Review of Systems  Constitutional:  Positive for fatigue. Negative for chills, diaphoresis and fever.  HENT:  Positive for congestion, rhinorrhea, sinus pressure and sinus pain. Negative for ear pain and sore throat.   Eyes:  Positive for redness and itching. Negative for discharge.  Respiratory:  Negative for cough and shortness of breath.   Cardiovascular:  Negative for chest pain.  Gastrointestinal:  Negative for abdominal pain, nausea and vomiting.  Musculoskeletal:  Negative for arthralgias and myalgias.  Skin:  Negative for rash.  Neurological:  Negative for weakness and headaches.  Hematological:  Negative for adenopathy.     Physical Exam Triage Vital Signs ED Triage Vitals  Encounter Vitals Group     BP 11/13/23 1537 130/70     Girls Systolic BP Percentile --      Girls Diastolic BP Percentile --      Boys Systolic BP Percentile --      Boys Diastolic BP Percentile --      Pulse Rate  11/13/23 1537 79     Resp 11/13/23 1537 15     Temp 11/13/23 1537 98 F (36.7 C)     Temp Source 11/13/23 1537 Oral     SpO2 11/13/23 1537 97 %     Weight 11/13/23 1535 169 lb 15.6 oz (77.1 kg)     Height 11/13/23 1535 5' 4.5 (1.638 m)     Head Circumference --      Peak Flow --      Pain Score 11/13/23 1535 7     Pain Loc --      Pain Education --      Exclude from Growth Chart --    No data found.  Updated Vital Signs BP 130/70 (BP Location: Left Arm)   Pulse 79   Temp 98 F (36.7 C) (Oral)   Resp 15   Ht 5' 4.5 (1.638 m)   Wt 169 lb 15.6 oz (77.1 kg)   LMP  (LMP Unknown)   SpO2 97%   BMI 28.73 kg/m     Physical Exam Vitals and nursing note reviewed.  Constitutional:      General: She is not in acute distress.    Appearance: Normal appearance. She is not ill-appearing or toxic-appearing.  HENT:     Head: Normocephalic and atraumatic.     Right Ear: Tympanic membrane, ear canal and external ear normal.     Left Ear: Tympanic membrane, ear canal and external ear normal.     Nose: Congestion present.     Mouth/Throat:     Mouth: Mucous membranes are moist.     Pharynx: Oropharynx is clear.   Eyes:     General: No scleral icterus.       Right eye: No discharge.        Left eye: No discharge.     Conjunctiva/sclera:     Right eye: Right conjunctiva is injected.     Left eye: Left conjunctiva is injected.    Cardiovascular:     Rate and Rhythm: Normal rate and regular rhythm.     Heart sounds: Normal heart sounds.  Pulmonary:     Effort: Pulmonary effort is normal. No respiratory distress.     Breath sounds:  Normal breath sounds.   Musculoskeletal:     Cervical back: Neck supple.   Skin:    General: Skin is dry.   Neurological:     General: No focal deficit present.     Mental Status: She is alert. Mental status is at baseline.     Motor: No weakness.     Gait: Gait normal.   Psychiatric:        Mood and Affect: Mood normal.        Behavior:  Behavior normal.      UC Treatments / Results  Labs (all labs ordered are listed, but only abnormal results are displayed) Labs Reviewed - No data to display  EKG   Radiology No results found.  Procedures Procedures (including critical care time)  Medications Ordered in UC Medications - No data to display  Initial Impression / Assessment and Plan / UC Course  I have reviewed the triage vital signs and the nursing notes.  Pertinent labs & imaging results that were available during my care of the patient were reviewed by me and considered in my medical decision making (see chart for details).   73 year old female with history of asthma, diabetes, hypertension, allergies presents for greater than a week history of sinus pressure, nasal congestion, runny nose, itchy watery eyes.  Has been taking allergy medicine with no relief and feels that she has a sinus infection.  Vitals are stable and normal.  Overall well-appearing.  On exam she has nasal congestion, bilateral conjunctival injection.  Throat is clear.  Chest clear.  Advised patient symptoms are consistent with allergic sinusitis.  Very low suspicion for bacterial sinusitis.  Patient believes she needs antibiotic.  Discussed this may not help.  Since she has been sick for over a week we can try it.  Sent Augmentin  to pharmacy but advised her to continue taking her Zyrtec and Flonase  and start using nasal saline.  Reviewed return precautions.   Final Clinical Impressions(s) / UC Diagnoses   Final diagnoses:  Acute sinusitis, recurrence not specified, unspecified location  Allergic rhinitis, unspecified seasonality, unspecified trigger     Discharge Instructions      - We discussed different causes for sinus infection.  In your case it seems more likely related to allergies or a virus.  Low suspicion for bacterial infection but I understand your concerns so I sent an antibiotic to the pharmacy. - Continue taking Zyrtec  and using Flonase .  Add nasal saline.  Rest and fluids.     ED Prescriptions     Medication Sig Dispense Auth. Provider   amoxicillin -clavulanate (AUGMENTIN ) 875-125 MG tablet Take 1 tablet by mouth every 12 (twelve) hours for 7 days. 14 tablet Chyanna Flock B, PA-C      PDMP not reviewed this encounter.   Arvis Jolan NOVAK, PA-C 11/13/23 [phone]

## 2023-11-13 NOTE — Discharge Instructions (Signed)
-   We discussed different causes for sinus infection.  In your case it seems more likely related to allergies or a virus.  Low suspicion for bacterial infection but I understand your concerns so I sent an antibiotic to the pharmacy. - Continue taking Zyrtec and using Flonase .  Add nasal saline.  Rest and fluids.

## 2023-11-15 ENCOUNTER — Other Ambulatory Visit: Payer: Self-pay | Admitting: Nurse Practitioner

## 2023-11-15 DIAGNOSIS — Z1231 Encounter for screening mammogram for malignant neoplasm of breast: Secondary | ICD-10-CM

## 2023-11-15 NOTE — Telephone Encounter (Signed)
 Requested Prescriptions  Pending Prescriptions Disp Refills   fluticasone  (FLONASE ) 50 MCG/ACT nasal spray [Pharmacy Med Name: Fluticasone  Propionate 50 MCG/ACT Nasal Suspension] 16 g 0    Sig: Use 2 spray(s) in each nostril once daily     Ear, Nose, and Throat: Nasal Preparations - Corticosteroids Passed - 11/15/2023  2:49 PM      Passed - Valid encounter within last 12 months    Recent Outpatient Visits           2 weeks ago Type 2 diabetes mellitus with diabetic neuropathy, with long-term current use of insulin  (HCC)   [place] [facility] [place], Melanie DASEN, NP

## 2023-11-24 ENCOUNTER — Other Ambulatory Visit

## 2023-11-24 NOTE — Progress Notes (Signed)
 11/24/2023 Name: Mary Parks MRN: [id number] DOB: 07/08/1950  Chief Complaint  Patient presents with   Diabetes Management Plan   Mary Parks is a 73 y.o. year old female who presented for a telephone visit.   They were referred to the pharmacist by a quality report for assistance in managing diabetes.   Subjective:  Care Team: Primary Care Provider: Valerio Melanie DASEN, NP ; Next Scheduled Visit: 01/26/2024  Medication Access/Adherence  Current Pharmacy:  [facility] [address] [place] - [address] [place] [address] Phone: [phone] Fax: [phone]  -Patient reports affordability concerns with their medications: No  -Patient reports access/transportation concerns to their pharmacy: No  -Patient reports adherence concerns with their medications:  Yes  -states nephrology recently prescribed Karendia, but pharmacy did not have in stock to be able to fill  Diabetes: Current medications: Farxiga  10mg  daily, Humalog  SSI, Lantus  40 units in the morning and at bedtime, Trulicity  4.5mg  weekly -Using Dexcom G7 Sensors for CGM -Endorses FBG 90-115 and 1 hr post-prandial averaging 180 -A1c on 6/4 was elevated at 8.3% -Previously followed by endo, but provider was retiring; so PCP now manages DM -PCP would like to switch patient to Mounjaro once she completes Trulicity  4.5mg  at home.  This should provide additional diabetes control.  Patient endroses having 2 more boxes of Trulicity  on hand. -Patient denies hypoglycemic s/sx including dizziness, shakiness, sweating. This happens rarely, and Dexcom will alert; patient knowledgeable on how to address. -Patient denies hyperglycemic symptoms including polyuria, polydipsia, polyphagia, nocturia, neuropathy, blurred vision.  Objective:  Lab Results  Component Value Date   HGBA1C 8.3 (H) 10/26/2023   Lab Results  Component Value Date   CREATININE 1.58 07/28/2023   BUN 16 10/20/2022   NA 146 (H)  10/20/2022   K 4.3 10/20/2022   CL 108 (H) 10/20/2022   CO2 25 10/20/2022   Medications Reviewed Today     Reviewed by Mary Channing LABOR, RPH (Pharmacist) on 11/24/23 at 1039  Med List Status: <None>   Medication Order Taking? Sig Documenting Provider Last Dose Status Informant  albuterol  (VENTOLIN  HFA) 108 (90 Base) MCG/ACT inhaler [id number]  INHALE 2 PUFFS BY MOUTH EVERY 6 HOURS AS NEEDED FOR WHEEZING AND  OR  SHORTNESS  OF  BREATH Cannady, Jolene T, NP  Active   aspirin  EC 81 MG tablet [id number]  Take 81 mg by mouth daily. [provider]  Active   budesonide -formoterol  (SYMBICORT ) 160-4.5 MCG/ACT inhaler [id number]  Inhale 2 puffs into the lungs 2 (two) times daily. Valerio Melanie T, NP  Active   calcium -vitamin D  (OSCAL WITH D) 500-200 MG-UNIT tablet [id number]  Take 1 tablet by mouth. [provider]  Active   FARXIGA  10 MG TABS tablet [id number] Yes Take 1 tablet (10 mg total) by mouth daily. Cannady, Jolene T, NP  Active   Finerenone 10 MG TABS [id number]  Take 10 mg by mouth daily. [provider]  Active   fluticasone  (FLONASE ) 50 MCG/ACT nasal spray [id number]  Use 2 spray(s) in each nostril once daily Cannady, Jolene T, NP  Active   insulin  lispro (HUMALOG ) 100 UNIT/ML KwikPen [id number] Yes INJECT 10 UNITS SUBCTUANEOUSLY BEFORE BREAKFAST AND LUNCH, AND 6 UNITS BEFORE EVENING MEAL. Cannady, Jolene T, NP  Active   LANTUS  SOLOSTAR 100 UNIT/ML Solostar Pen [id number] Yes INJECT 45 UNITS SUBCUTANEOUSLY IN THE MORNING AND 40 UNITS AT NIGHT  Patient taking differently: INJECT 40 UNITS SUBCUTANEOUSLY IN THE MORNING  AND 40 UNITS AT NIGHT   Cannady, Jolene T, NP  Active   levothyroxine  (SYNTHROID ) 75 MCG tablet [id number]  Take 75 mcg by mouth every morning. [provider]  Active   lisinopril  (ZESTRIL ) 40 MG tablet [id number]  Take 40 mg by mouth daily. [provider]  Active   meclizine  (ANTIVERT ) 12.5 MG tablet [id number]  Take 1 tablet (12.5 mg  total) by mouth 3 (three) times daily as needed for dizziness. Cannady, Jolene T, NP  Active   metoprolol  tartrate (LOPRESSOR ) 25 MG tablet [id number]  Take 1 tablet by mouth twice daily Cannady, Jolene T, NP  Active   montelukast  (SINGULAIR ) 10 MG tablet [id number]  Take 1 tablet (10 mg total) by mouth at bedtime. Cannady, Jolene T, NP  Active   pantoprazole  (PROTONIX ) 40 MG tablet [id number]  Take 1 tablet (40 mg total) by mouth daily. Cannady, Jolene T, NP  Active   rosuvastatin  (CRESTOR ) 40 MG tablet [id number]  TAKE 1 TABLET BY MOUTH AT BEDTIME Cannady, Jolene T, NP  Active   TRULICITY  4.5 MG/0.5ML SOAJ [id number] Yes Inject 4.5 mg into the skin once a week. [provider]  Active            Assessment/Plan:   Diabetes: -Currently uncontrolled -Continue current regimen at this time -Continue use of Dexcom G7 for CGM -Contacting PA team to see if test claim for Mounjaro 7.5mg  weekly can be done to see if PA will need to be done for patient to switch therapy in the future -Sees PCP again 9/4, which will be right around the time she will run out of Trulicity  4.5mg   Follow Up Plan: Will check in with patient in 3 months to see how she is tolerating change to Pat Channing DELENA Mary, PharmD, Parks

## 2023-11-28 ENCOUNTER — Other Ambulatory Visit (HOSPITAL_COMMUNITY): Payer: Self-pay

## 2023-11-28 ENCOUNTER — Telehealth: Payer: Self-pay

## 2023-11-28 NOTE — Telephone Encounter (Signed)
 Pharmacy Patient Advocate Encounter   Received notification from Physician's Office that prior authorization for Mounjaro 2.5mg /0.26ml Pen is required/requested.   Insurance verification completed.   The patient is insured through [employer] [employer] .   Per test claim: The current 28 day co-pay is, $0.00.  No PA needed at this time. This test claim was processed through [facility]- copay amounts may vary at other pharmacies due to pharmacy/plan contracts, or as the patient moves through the different stages of their insurance plan.

## 2023-11-28 NOTE — Telephone Encounter (Signed)
-----   Message from Channing DELENA Mealing sent at 11/24/2023 10:51 AM EDT ----- Good morning!  This patient is currently using Trulicity  4.5mg  weekly, but PCP would like to change to Mounjaro once she runs out of Trulicity .  Could you do a test claim to see if we will need to work on a PA for [employer] 7.5mg ?  No rush AT ALL; patient still has plenty of Trulicity .  Thank you!

## 2023-11-28 NOTE — Progress Notes (Signed)
 Looks like the medication should be covered, a test claim shows a $0.00 copay

## 2023-12-06 ENCOUNTER — Other Ambulatory Visit: Payer: Self-pay | Admitting: Nurse Practitioner

## 2023-12-07 NOTE — Telephone Encounter (Signed)
 Requested Prescriptions  Pending Prescriptions Disp Refills   insulin  lispro (HUMALOG ) 100 UNIT/ML KwikPen [Pharmacy Med Name: Insulin  Lispro (1 Unit Dial ) 100 UNIT/ML Subcutaneous Solution Pen-injector] 15 mL 0    Sig: INJECT 10 UNITS SUBCUTANEOUSLY BEFORE BREAKFAST AND LUNCH. INJECT 6 UNITS BEFORE EVENING MEAL.     Endocrinology:  Diabetes - Insulins Failed - 12/07/2023  1:02 PM      Failed - HBA1C is between 0 and 7.9 and within 180 days    Hemoglobin-A1c  Date Value Ref Range Status  07/28/2023 7.6%  Final   HB A1C (BAYER DCA - WAIVED)  Date Value Ref Range Status  10/26/2023 8.3 (H) 4.8 - 5.6 % Final    Comment:             Prediabetes: 5.7 - 6.4          Diabetes: >6.4          Glycemic control for adults with diabetes: <7.0    A1c  Date Value Ref Range Status  07/11/2023 8.4  Final         Passed - Valid encounter within last 6 months    Recent Outpatient Visits           1 month ago Type 2 diabetes mellitus with diabetic neuropathy, with long-term current use of insulin  (HCC)   [facility] [place], Melanie DASEN, NP

## 2023-12-28 ENCOUNTER — Ambulatory Visit
Admission: RE | Admit: 2023-12-28 | Discharge: 2023-12-28 | Disposition: A | Discharge status: Home / Self Care | Source: Ambulatory Visit | Attending: Nurse Practitioner | Admitting: Nurse Practitioner

## 2023-12-28 DIAGNOSIS — N1832 Chronic kidney disease, stage 3b: Secondary | ICD-10-CM | POA: Diagnosis not present

## 2023-12-28 DIAGNOSIS — Z1231 Encounter for screening mammogram for malignant neoplasm of breast: Secondary | ICD-10-CM | POA: Insufficient documentation

## 2024-01-01 ENCOUNTER — Other Ambulatory Visit: Payer: Self-pay | Admitting: Nurse Practitioner

## 2024-01-04 NOTE — Telephone Encounter (Signed)
 Requested Prescriptions  Pending Prescriptions Disp Refills   rosuvastatin  (CRESTOR ) 40 MG tablet [Pharmacy Med Name: Rosuvastatin  Calcium  40 MG Oral Tablet] 90 tablet 0    Sig: TAKE 1 TABLET BY MOUTH AT BEDTIME     Cardiovascular:  Antilipid - Statins 2 Failed - 01/04/2024  1:51 PM      Failed - Lipid Panel in normal range within the last 12 months    Cholesterol, Total  Date Value Ref Range Status  10/26/2023 133 100 - 199 mg/dL Final   Cholesterol Piccolo, Waived  Date Value Ref Range Status  07/07/2015 157 <200 mg/dL Final    Comment:                            Desirable                <200                         Borderline High      200- 239                         High                     >239    LDL Chol Calc (NIH)  Date Value Ref Range Status  10/26/2023 72 0 - 99 mg/dL Final   HDL  Date Value Ref Range Status  10/26/2023 44 >39 mg/dL Final   Triglycerides  Date Value Ref Range Status  10/26/2023 86 0 - 149 mg/dL Final   Triglycerides Piccolo,Waived  Date Value Ref Range Status  07/07/2015 135 <150 mg/dL Final    Comment:                            Normal                   <150                         Borderline High     150 - 199                         High                200 - 499                         Very High                >499          Passed - Cr in normal range and within 360 days    Creat  Date Value Ref Range Status  07/28/2023 1.58  Final         Passed - Patient is not pregnant      Passed - Valid encounter within last 12 months    Recent Outpatient Visits           2 months ago Type 2 diabetes mellitus with diabetic neuropathy, with long-term current use of insulin  (HCC)   [place] [facility] [place], Melanie DASEN, NP

## 2024-01-10 ENCOUNTER — Ambulatory Visit (INDEPENDENT_AMBULATORY_CARE_PROVIDER_SITE_OTHER): Payer: Self-pay | Admitting: Emergency Medicine

## 2024-01-10 VITALS — Ht 64.0 in | Wt 165.0 lb

## 2024-01-10 DIAGNOSIS — Z0000 Encounter for general adult medical examination without abnormal findings: Secondary | ICD-10-CM

## 2024-01-10 NOTE — Patient Instructions (Signed)
 Mary Parks , Thank you for taking time out of your busy schedule to complete your Annual Wellness Visit with me. I enjoyed our conversation and look forward to speaking with you again next year. I, as well as your care team,  appreciate your ongoing commitment to your health goals. Please review the following plan we discussed and let me know if I can assist you in the future. Your Game plan/ To Do List    Referrals: None   Follow up Visits: We will see or speak with you next year for your Next Medicare AWV with our clinical staff Have you seen your provider in the last 6 months (3 months if uncontrolled diabetes)? Yes  Clinician Recommendations: Get the covid and flu vaccines at your next OV on 01/26/24.  Aim for 30 minutes of exercise or brisk walking, 6-8 glasses of water , and 5 servings of fruits and vegetables each day.       This is a list of the screenings recommended for you:  Health Maintenance  Topic Date Due   COVID-19 Vaccine (8 - 2024-25 season) 08/08/2023   Flu Shot  12/23/2023   Hemoglobin A1C  04/26/2024   Eye exam for diabetics  10/17/2024   Yearly kidney function blood test for diabetes  10/20/2024   Yearly kidney health urinalysis for diabetes  10/25/2024   Complete foot exam   10/25/2024   Mammogram  12/27/2024   Medicare Annual Wellness Visit  01/09/2025   Colon Cancer Screening  06/15/2026   DTaP/Tdap/Td vaccine (3 - Tdap) 01/10/2030   DEXA scan (bone density measurement)  03/28/2033   Pneumococcal Vaccine for age over 44  Completed   Hepatitis C Screening  Completed   HPV Vaccine  Aged Out   Meningitis B Vaccine  Aged Out   Pneumococcal Vaccine  Discontinued   Zoster (Shingles) Vaccine  Discontinued    Advanced directives: (Provided) Advance directive discussed with you today. I have provided a copy for you to complete at home and have notarized. Once this is complete, please bring a copy in to our office so we can scan it into your chart.  Advance Care  Planning is important because it:  [x]  Makes sure you receive the medical care that is consistent with your values, goals, and preferences  [x]  It provides guidance to your family and loved ones and reduces their decisional burden about whether or not they are making the right decisions based on your wishes.  Follow the link provided in your after visit summary or read over the paperwork we have mailed to you to help you started getting your Advance Directives in place. If you need assistance in completing these, please reach out to us  so that we can help you!  See attachments for Preventive Care and Fall Prevention Tips.   Fall Prevention in the Home, Adult Falls can cause injuries and affect people of all ages. There are many simple things that you can do to make your home safe and to help prevent falls. If you need it, ask for help making these changes. What actions can I take to prevent falls? General information Use good lighting in all rooms. Make sure to: Replace any light bulbs that burn out. Turn on lights if it is dark and use night-lights. Keep items that you use often in easy-to-reach places. Lower the shelves around your home if needed. Move furniture so that there are clear paths around it. Do not keep throw rugs or other things  on the floor that can make you trip. If any of your floors are uneven, fix them. Add color or contrast paint or tape to clearly mark and help you see: Grab bars or handrails. First and last steps of staircases. Where the edge of each step is. If you use a ladder or stepladder: Make sure that it is fully opened. Do not climb a closed ladder. Make sure the sides of the ladder are locked in place. Have someone hold the ladder while you use it. Know where your pets are as you move through your home. What can I do in the bathroom?     Keep the floor dry. Clean up any water  that is on the floor right away. Remove soap buildup in the bathtub or  shower. Buildup makes bathtubs and showers slippery. Use non-skid mats or decals on the floor of the bathtub or shower. Attach bath mats securely with double-sided, non-slip rug tape. If you need to sit down while you are in the shower, use a non-slip stool. Install grab bars by the toilet and in the bathtub and shower. Do not use towel bars as grab bars. What can I do in the bedroom? Make sure that you have a light by your bed that is easy to reach. Do not use any sheets or blankets on your bed that hang to the floor. Have a firm bench or chair with side arms that you can use for support when you get dressed. What can I do in the kitchen? Clean up any spills right away. If you need to reach something above you, use a sturdy step stool that has a grab bar. Keep electrical cables out of the way. Do not use floor polish or wax that makes floors slippery. What can I do with my stairs? Do not leave anything on the stairs. Make sure that you have a light switch at the top and the bottom of the stairs. Have them installed if you do not have them. Make sure that there are handrails on both sides of the stairs. Fix handrails that are broken or loose. Make sure that handrails are as long as the staircases. Install non-slip stair treads on all stairs in your home if they do not have carpet. Avoid having throw rugs at the top or bottom of stairs, or secure the rugs with carpet tape to prevent them from moving. Choose a carpet design that does not hide the edge of steps on the stairs. Make sure that carpet is firmly attached to the stairs. Fix any carpet that is loose or worn. What can I do on the outside of my home? Use bright outdoor lighting. Repair the edges of walkways and driveways and fix any cracks. Clear paths of anything that can make you trip, such as tools or rocks. Add color or contrast paint or tape to clearly mark and help you see high doorway thresholds. Trim any bushes or trees on the  main path into your home. Check that handrails are securely fastened and in good repair. Both sides of all steps should have handrails. Install guardrails along the edges of any raised decks or porches. Have leaves, snow, and ice cleared regularly. Use sand, salt, or ice melt on walkways during winter months if you live where there is ice and snow. In the garage, clean up any spills right away, including grease or oil spills. What other actions can I take? Review your medicines with your health care provider. Some medicines can  make you confused or feel dizzy. This can increase your chance of falling. Wear closed-toe shoes that fit well and support your feet. Wear shoes that have rubber soles and low heels. Use a cane, walker, scooter, or crutches that help you move around if needed. Talk with your provider about other ways that you can decrease your risk of falls. This may include seeing a physical therapist to learn to do exercises to improve movement and strength. Where to find more information Centers for Disease Control and Prevention, STEADI: TonerPromos.no [employer] on Aging: BaseRingTones.pl National Institute on Aging: BaseRingTones.pl Contact a health care provider if: You are afraid of falling at home. You feel weak, drowsy, or dizzy at home. You fall at home. Get help right away if you: Lose consciousness or have trouble moving after a fall. Have a fall that causes a head injury. These symptoms may be an emergency. Get help right away. Call 911. Do not wait to see if the symptoms will go away. Do not drive yourself to the hospital. This information is not intended to replace advice given to you by your health care provider. Make sure you discuss any questions you have with your health care provider. Document Revised: 01/11/2022 Document Reviewed: 01/11/2022 Elsevier Patient Education  2024 [employer].

## 2024-01-10 NOTE — Progress Notes (Signed)
 Subjective:   Mary Parks is a 73 y.o. who presents for a Medicare Wellness preventive visit.  As a reminder, Annual Wellness Visits don't include a physical exam, and some assessments may be limited, especially if this visit is performed virtually. We may recommend an in-person follow-up visit with your provider if needed.  Visit Complete: Virtual I connected with  Mary Parks Public on 01/10/24 by a audio enabled telemedicine application and verified that I am speaking with the correct person using two identifiers.  Patient Location: Home  Provider Location: Office/Clinic  I discussed the limitations of evaluation and management by telemedicine. The patient expressed understanding and agreed to proceed.  Vital Signs: Because this visit was a virtual/telehealth visit, some criteria may be missing or patient reported. Any vitals not documented were not able to be obtained and vitals that have been documented are patient reported.  VideoDeclined- This patient declined Librarian, academic. Therefore the visit was completed with audio only.  Persons Participating in Visit: Patient.  AWV Questionnaire: No: Patient Medicare AWV questionnaire was not completed prior to this visit.  Cardiac Risk Factors include: advanced age (>28men, >55 women);hypertension;dyslipidemia;diabetes mellitus     Objective:    Today's Vitals   01/10/24 0800  Weight: 165 lb (74.8 kg)  Height: 5' 4 (1.626 m)   Body mass index is 28.32 kg/m.     01/10/2024    8:10 AM 11/13/2023    3:36 PM 01/04/2023    8:33 AM 01/27/2022   10:34 AM 12/30/2021   11:38 AM 07/20/2021   12:43 PM 06/15/2021    6:57 AM  Advanced Directives  Does Patient Have a Medical Advance Directive? No No No No No No No  Would patient like information on creating a medical advance directive? Yes (MAU/Ambulatory/Procedural Areas - Information given)  Yes (MAU/Ambulatory/Procedural Areas - Information given)  No -  Patient declined  Yes (Inpatient - patient defers creating a medical advance directive and declines information at this time)    Current Medications (verified) Outpatient Encounter Medications as of 01/10/2024  Medication Sig   albuterol  (VENTOLIN  HFA) 108 (90 Base) MCG/ACT inhaler INHALE 2 PUFFS BY MOUTH EVERY 6 HOURS AS NEEDED FOR WHEEZING AND  OR  SHORTNESS  OF  BREATH   aspirin  EC 81 MG tablet Take 81 mg by mouth daily.   budesonide -formoterol  (SYMBICORT ) 160-4.5 MCG/ACT inhaler Inhale 2 puffs into the lungs 2 (two) times daily.   calcium -vitamin D  (OSCAL WITH D) 500-200 MG-UNIT tablet Take 1 tablet by mouth.   FARXIGA  10 MG TABS tablet Take 1 tablet (10 mg total) by mouth daily.   Finerenone 10 MG TABS Take 10 mg by mouth daily.   fluticasone  (FLONASE ) 50 MCG/ACT nasal spray Use 2 spray(s) in each nostril once daily   insulin  lispro (HUMALOG ) 100 UNIT/ML KwikPen INJECT 10 UNITS SUBCUTANEOUSLY BEFORE BREAKFAST AND LUNCH. INJECT 6 UNITS BEFORE EVENING MEAL.   LANTUS  SOLOSTAR 100 UNIT/ML Solostar Pen INJECT 45 UNITS SUBCUTANEOUSLY IN THE MORNING AND 40 UNITS AT NIGHT (Patient taking differently: INJECT 40 UNITS SUBCUTANEOUSLY IN THE MORNING AND 40 UNITS AT NIGHT)   levothyroxine  (SYNTHROID ) 75 MCG tablet Take 75 mcg by mouth every morning.   lisinopril  (ZESTRIL ) 40 MG tablet Take 40 mg by mouth daily.   meclizine  (ANTIVERT ) 12.5 MG tablet Take 1 tablet (12.5 mg total) by mouth 3 (three) times daily as needed for dizziness.   metoprolol  tartrate (LOPRESSOR ) 25 MG tablet Take 1 tablet by mouth twice  daily   montelukast  (SINGULAIR ) 10 MG tablet Take 1 tablet (10 mg total) by mouth at bedtime.   pantoprazole  (PROTONIX ) 40 MG tablet Take 1 tablet (40 mg total) by mouth daily.   rosuvastatin  (CRESTOR ) 40 MG tablet TAKE 1 TABLET BY MOUTH AT BEDTIME   TRULICITY  4.5 MG/0.5ML SOAJ Inject 4.5 mg into the skin once a week.   No facility-administered encounter medications on file as of 01/10/2024.     Allergies (verified) Celebrex [celecoxib], Sulfa antibiotics, Elemental sulfur, and Sulfisoxazole   History: Past Medical History:  Diagnosis Date   Asthma    Chest pain    DM2 (diabetes mellitus, type 2) (HCC) 1987   Dyspnea    Cath normal coronaries. normal EF. RA 11. PA 37/17 (26) LVEDP 36   GERD (gastroesophageal reflux disease)    HTN (hypertension)    Hyperlipidemia    Hypothyroidism    Obesity    Osteoarthritis    knees   Renal disease    Thyroid  disease    having thyroid  removed 04/14/15   Vertigo    none recently   Past Surgical History:  Procedure Laterality Date   CARDIAC CATHETERIZATION     CATARACT EXTRACTION W/PHACO Right 03/31/2015   Procedure: CATARACT EXTRACTION PHACO AND INTRAOCULAR LENS PLACEMENT (IOC);  Surgeon: Donzell Arlyce Budd, MD;  Location: [facility];  Service: Ophthalmology;  Laterality: Right;  DIABETIC - insulin    CATARACT EXTRACTION W/PHACO Left 08/31/2021   Procedure: CATARACT EXTRACTION PHACO AND INTRAOCULAR LENS PLACEMENT (IOC) LEFT DIABETIC 7.54 01:00.2;  Surgeon: Myrna Adine Anes, MD;  Location: [facility];  Service: Ophthalmology;  Laterality: Left;  Diabetic   COLONOSCOPY WITH PROPOFOL  N/A 11/12/2016   Procedure: COLONOSCOPY WITH PROPOFOL ;  Surgeon: Jinny Carmine, MD;  Location: [facility];  Service: Endoscopy;  Laterality: N/A;  Diabetic - insulin    COLONOSCOPY WITH PROPOFOL  N/A 06/15/2021   Procedure: COLONOSCOPY WITH BIOPSY;  Surgeon: Jinny Carmine, MD;  Location: [facility];  Service: Endoscopy;  Laterality: N/A;  Diabetic   EYE SURGERY     HERNIA REPAIR     umbilical   POLYPECTOMY  11/12/2016   Procedure: POLYPECTOMY INTESTINAL;  Surgeon: Jinny Carmine, MD;  Location: [facility];  Service: Endoscopy;;   POLYPECTOMY N/A 06/15/2021   Procedure: POLYPECTOMY;  Surgeon: Jinny Carmine, MD;  Location: [facility];  Service: Endoscopy;  Laterality: N/A;   THYROIDECTOMY N/A 04/14/2015    Procedure: THYROIDECTOMY;  Surgeon: Deward Argue, MD;  Location: [facility];  Service: ENT;  Laterality: N/A;   Family History  Problem Relation Age of Onset   Diabetes Mother    Diabetes Father    Diabetes Sister    Diabetes Brother    Diabetes Sister    Diabetes Sister    Diabetes Maternal Grandmother    Diabetes Maternal Grandfather    Diabetes Paternal Grandmother    Diabetes Paternal Grandfather    Heart disease Neg Hx    Coronary artery disease Neg Hx    Breast cancer Neg Hx    Social History   Socioeconomic History   Marital status: Widowed    Spouse name: Not on file   Number of children: 2   Years of education: Not on file   Highest education level: Some college, no degree  Occupational History   Occupation:  [employer], 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    Associate Professor: OTHER   Occupation: retired  Tobacco Use   Smoking status: Never   Smokeless tobacco: Never  Vaping Use   Vaping status: Never Used  Substance and Sexual Activity   Alcohol use: No   Drug use: No   Sexual activity: Not Currently  Other Topics Concern   Not on file  Social History Narrative   Not on file   Social Drivers of Health   Financial Resource Strain: Low Risk  (01/10/2024)   Overall Financial Resource Strain (CARDIA)    Difficulty of Paying Living Expenses: Not hard at all  Food Insecurity: No Food Insecurity (01/10/2024)   Hunger Vital Sign    Worried About Running Out of Food in the Last Year: Never true    Ran Out of Food in the Last Year: Never true  Transportation Needs: No Transportation Needs (01/10/2024)   PRAPARE - Administrator, Civil Service (Medical): No    Lack of Transportation (Non-Medical): No  Physical Activity: Insufficiently Active (01/10/2024)   Exercise Vital Sign    Days of Exercise per Week: 2 days    Minutes of Exercise per Session: 30 min  Stress: No Stress Concern Present (01/10/2024)   [employer] of Occupational Health - Occupational Stress  Questionnaire    Feeling of Stress: Not at all  Social Connections: Moderately Integrated (01/10/2024)   Social Connection and Isolation Panel    Frequency of Communication with Friends and Family: More than three times a week    Frequency of Social Gatherings with Friends and Family: More than three times a week    Attends Religious Services: More than 4 times per year    Active Member of [employer] or Organizations: Yes    Attends Banker Meetings: More than 4 times per year    Marital Status: Widowed    Tobacco Counseling Counseling given: Not Answered    Clinical Intake:  Pre-visit preparation completed: Yes  Pain : No/denies pain     BMI - recorded: 28.32 Nutritional Status: BMI 25 -29 Overweight Nutritional Risks: None Diabetes: Yes CBG done?: No (FBS 121 per patient) Did pt. bring in CBG monitor from home?: No  Lab Results  Component Value Date   HGBA1C 8.3 (H) 10/26/2023   HGBA1C 7.6% 07/28/2023   HGBA1C 7.8 (H) 10/20/2022     How often do you need to have someone help you when you read instructions, pamphlets, or other written materials from your doctor or pharmacy?: 1 - Never  Interpreter Needed?: No  Information entered by :: Vina Ned, CMA   Activities of Daily Living     01/10/2024    8:02 AM  In your present state of health, do you have any difficulty performing the following activities:  Hearing? 0  Vision? 0  Difficulty concentrating or making decisions? 0  Walking or climbing stairs? 1  Comment due to bad knee  Dressing or bathing? 0  Doing errands, shopping? 0  Preparing Food and eating ? N  Using the Toilet? N  In the past six months, have you accidently leaked urine? N  Do you have problems with loss of bowel control? N  Managing your Medications? N  Managing your Finances? N  Housekeeping or managing your Housekeeping? N    Patient Care Team: Cannady, Jolene T, NP as PCP - General (Nurse Practitioner) Myrna Adine Anes, MD as Consulting Physician (Ophthalmology) Teresia Space, MD as Referring Physician (Nephrology) Defoor, Elsie HERO, PA-C (Rheumatology)  I have updated your Care Teams any recent Medical Services you may have received from other providers in the past year.  Assessment:   This is a routine wellness examination for [place].  Hearing/Vision screen Hearing Screening - Comments:: Denies hearing loss  Vision Screening - Comments:: Gets DM eye exams, Dr. Adine Novak, [place]   Goals Addressed             This Visit's Progress    Patient Stated       Continue to work on DM control       Depression Screen     01/10/2024    8:08 AM 10/26/2023   11:05 AM 04/25/2023   11:21 AM 02/08/2023    3:40 PM 01/04/2023    8:31 AM 10/20/2022   11:02 AM 04/19/2022   10:58 AM  PHQ 2/9 Scores  PHQ - 2 Score 0 0 0 0 0 0 0  PHQ- 9 Score 0 0 0 0 0 0 1    Fall Risk     01/10/2024    8:14 AM 10/26/2023   11:03 AM 04/25/2023   11:21 AM 01/04/2023    8:33 AM 10/20/2022   11:02 AM  Fall Risk   Falls in the past year? 0 0 0 0 0  Number falls in past yr: 0 0 0 0 0  Injury with Fall? 0 0 0 0 0  Risk for fall due to : No Fall Risks No Fall Risks No Fall Risks No Fall Risks No Fall Risks  Follow up Falls evaluation completed Falls evaluation completed Falls evaluation completed Falls prevention discussed Falls evaluation completed    MEDICARE RISK AT HOME:  Medicare Risk at Home Any stairs in or around the home?: Yes If so, are there any without handrails?: No Home free of loose throw rugs in walkways, pet beds, electrical cords, etc?: Yes Adequate lighting in your home to reduce risk of falls?: Yes Life alert?: No Use of a cane, walker or w/c?: No Grab bars in the bathroom?: Yes Shower chair or bench in shower?: Yes Elevated toilet seat or a handicapped toilet?: No  TIMED UP AND GO:  Was the test performed?  No  Cognitive Function: 6CIT completed        01/10/2024    8:15 AM  01/04/2023    8:34 AM 12/30/2021   11:38 AM 12/29/2020   11:20 AM 12/28/2019    9:05 AM  6CIT Screen  What Year? 0 points 0 points 0 points 0 points 0 points  What month? 0 points 0 points 0 points 0 points 0 points  What time? 0 points 0 points 0 points 0 points 0 points  Count back from 20 0 points 0 points 0 points 0 points 0 points  Months in reverse 0 points 0 points 0 points 0 points 0 points  Repeat phrase 2 points 2 points 0 points 0 points 0 points  Total Score 2 points 2 points 0 points 0 points 0 points    Immunizations Immunization History  Administered Date(s) Administered   Fluad Quad(high Dose 65+) 03/24/2022   Fluad Trivalent(High Dose 65+) 02/08/2023   Influenza, High Dose Seasonal PF 02/25/2018, 02/15/2019, 02/28/2020, 03/27/2021   Influenza,inj,Quad PF,6+ Mos 03/25/2015   Influenza-Unspecified 02/21/2014, 03/31/2016, 04/01/2017   Moderna SARS-COV2 Booster Vaccination 02/06/2021   PFIZER(Purple Top)SARS-COV-2 Vaccination 07/06/2019, 07/27/2019, 03/13/2020, 10/08/2020, 03/24/2022   Pfizer(Comirnaty)Fall Seasonal Vaccine 12 years and older 02/08/2023   Pneumococcal Conjugate-13 09/26/2013   Pneumococcal Polysaccharide-23 03/13/2008, 03/25/2015   Td 03/13/2008, 01/11/2020    Screening Tests Health Maintenance  Topic Date Due   COVID-19 Vaccine (8 -  2024-25 season) 08/08/2023   INFLUENZA VACCINE  12/23/2023   HEMOGLOBIN A1C  04/26/2024   OPHTHALMOLOGY EXAM  10/17/2024   Diabetic kidney evaluation - eGFR measurement  10/20/2024   Diabetic kidney evaluation - Urine ACR  10/25/2024   FOOT EXAM  10/25/2024   MAMMOGRAM  12/27/2024   Medicare Annual Wellness (AWV)  01/09/2025   Colonoscopy  06/15/2026   DTaP/Tdap/Td (3 - Tdap) 01/10/2030   DEXA SCAN  03/28/2033   Pneumococcal Vaccine: 50+ Years  Completed   Hepatitis C Screening  Completed   HPV VACCINES  Aged Out   Meningococcal B Vaccine  Aged Out   Pneumococcal Vaccine  Discontinued   Zoster Vaccines- Shingrix   Discontinued    Health Maintenance  Health Maintenance Due  Topic Date Due   COVID-19 Vaccine (8 - 2024-25 season) 08/08/2023   INFLUENZA VACCINE  12/23/2023   Health Maintenance Items Addressed: See Nurse Notes at the end of this note  Additional Screening:  Vision Screening: Recommended annual ophthalmology exams for early detection of glaucoma and other disorders of the eye. Would you like a referral to an eye doctor? No    Dental Screening: Recommended annual dental exams for proper oral hygiene  Community Resource Referral / Chronic Care Management: CRR required this visit?  No   CCM required this visit?  No   Plan:    I have personally reviewed and noted the following in the patient's chart:   Medical and social history Use of alcohol, tobacco or illicit drugs  Current medications and supplements including opioid prescriptions. Patient is not currently taking opioid prescriptions. Functional ability and status Nutritional status Physical activity Advanced directives List of other physicians Hospitalizations, surgeries, and ER visits in previous 12 months Vitals Screenings to include cognitive, depression, and falls Referrals and appointments  In addition, I have reviewed and discussed with patient certain preventive protocols, quality metrics, and best practice recommendations. A written personalized care plan for preventive services as well as general preventive health recommendations were provided to patient.   Vina Ned, CMA   01/10/2024   After Visit Summary: (Mail) Due to this being a telephonic visit, the after visit summary with patients personalized plan was offered to patient via mail   Notes:  6 CIT Score - 2 FBS this morning per patient was 121 Covid and flu vaccines at next OV on 01/26/24 Screening colonoscopy no longer recommended per GI due to age Declined DM & Nutrition education referral

## 2024-01-16 ENCOUNTER — Telehealth: Payer: Self-pay

## 2024-01-16 NOTE — Telephone Encounter (Unsigned)
 Copied from CRM #[id number]. Topic: Clinical - Medication Question >> Jan 16, 2024  3:26 PM Willma R wrote: Reason for CRM: Patient is on her last dose of TRULICITY  4.5 MG/0.5ML SOAJ. States at her last appointment NP Cannady advised was going to place her on a new medication but unsure which one it is. Would like to confirm which mediation so she can request it to be filled.  Patient can be reached at [phone]

## 2024-01-17 MED ORDER — TIRZEPATIDE 5 MG/0.5ML ~~LOC~~ SOAJ: 5.0000 mg | SUBCUTANEOUS | 2 refills | Status: DC

## 2024-01-17 NOTE — Telephone Encounter (Signed)
 Routing to provider. Reviewed last note and looks like patient would be switching to Mounjaro .

## 2024-01-17 NOTE — Telephone Encounter (Signed)
 Called and notified patient of medication change.  PA team- can we go ahead and start the PA process if needed please?

## 2024-01-18 ENCOUNTER — Other Ambulatory Visit (HOSPITAL_COMMUNITY): Payer: Self-pay

## 2024-01-18 NOTE — Telephone Encounter (Signed)
 Medication was filled 01/17/2024, no PA needed at this time. Thank you.

## 2024-01-22 NOTE — Patient Instructions (Signed)

## 2024-01-25 DIAGNOSIS — E1165 Type 2 diabetes mellitus with hyperglycemia: Secondary | ICD-10-CM | POA: Diagnosis not present

## 2024-01-26 ENCOUNTER — Ambulatory Visit (INDEPENDENT_AMBULATORY_CARE_PROVIDER_SITE_OTHER): Admitting: Nurse Practitioner

## 2024-01-26 ENCOUNTER — Encounter: Payer: Self-pay | Admitting: Nurse Practitioner

## 2024-01-26 VITALS — BP 129/72 | HR 71 | Temp 97.6°F | Ht 64.0 in | Wt 167.4 lb

## 2024-01-26 DIAGNOSIS — E1169 Type 2 diabetes mellitus with other specified complication: Secondary | ICD-10-CM

## 2024-01-26 DIAGNOSIS — E890 Postprocedural hypothyroidism: Secondary | ICD-10-CM

## 2024-01-26 DIAGNOSIS — E1122 Type 2 diabetes mellitus with diabetic chronic kidney disease: Secondary | ICD-10-CM

## 2024-01-26 DIAGNOSIS — J301 Allergic rhinitis due to pollen: Secondary | ICD-10-CM

## 2024-01-26 DIAGNOSIS — J4520 Mild intermittent asthma, uncomplicated: Secondary | ICD-10-CM

## 2024-01-26 DIAGNOSIS — E66811 Obesity, class 1: Secondary | ICD-10-CM

## 2024-01-26 DIAGNOSIS — Z23 Encounter for immunization: Secondary | ICD-10-CM

## 2024-01-26 DIAGNOSIS — I10 Essential (primary) hypertension: Secondary | ICD-10-CM

## 2024-01-26 DIAGNOSIS — E785 Hyperlipidemia, unspecified: Secondary | ICD-10-CM | POA: Diagnosis not present

## 2024-01-26 DIAGNOSIS — E1140 Type 2 diabetes mellitus with diabetic neuropathy, unspecified: Secondary | ICD-10-CM

## 2024-01-26 DIAGNOSIS — Z794 Long term (current) use of insulin: Secondary | ICD-10-CM | POA: Diagnosis not present

## 2024-01-26 DIAGNOSIS — E782 Mixed hyperlipidemia: Secondary | ICD-10-CM

## 2024-01-26 DIAGNOSIS — E0822 Diabetes mellitus due to underlying condition with diabetic chronic kidney disease: Secondary | ICD-10-CM

## 2024-01-26 DIAGNOSIS — K869 Disease of pancreas, unspecified: Secondary | ICD-10-CM

## 2024-01-26 LAB — BAYER DCA HB A1C WAIVED: HB A1C (BAYER DCA - WAIVED): 8.9 % — ABNORMAL HIGH (ref 4.8–5.6)

## 2024-01-26 MED ORDER — AZITHROMYCIN 250 MG PO TABS: ORAL_TABLET | ORAL | 0 refills | Status: AC

## 2024-01-26 MED ORDER — ROSUVASTATIN CALCIUM 40 MG PO TABS: 40.0000 mg | ORAL_TABLET | Freq: Every day | ORAL | 4 refills | Status: AC

## 2024-01-26 MED ORDER — BUDESONIDE-FORMOTEROL FUMARATE 160-4.5 MCG/ACT IN AERO: 2.0000 | INHALATION_SPRAY | Freq: Two times a day (BID) | RESPIRATORY_TRACT | 4 refills | Status: AC

## 2024-01-26 MED ORDER — LEVOTHYROXINE SODIUM 75 MCG PO TABS: 75.0000 ug | ORAL_TABLET | Freq: Every morning | ORAL | 3 refills | Status: AC

## 2024-01-26 MED ORDER — LANTUS SOLOSTAR 100 UNIT/ML ~~LOC~~ SOPN: PEN_INJECTOR | SUBCUTANEOUS | 2 refills | Status: AC

## 2024-01-26 MED ORDER — METOPROLOL TARTRATE 25 MG PO TABS: 25.0000 mg | ORAL_TABLET | Freq: Two times a day (BID) | ORAL | 3 refills | Status: AC

## 2024-01-26 MED ORDER — MECLIZINE HCL 12.5 MG PO TABS: 12.5000 mg | ORAL_TABLET | Freq: Three times a day (TID) | ORAL | 0 refills | Status: AC | PRN

## 2024-01-26 NOTE — Assessment & Plan Note (Signed)
 Chronic, stable on current medication regimen, with minimal use of Albuterol .  Mild exacerbation present, suspect related to poor control of allergic rhinitis and ongoing sinus issues.  Zpack sent in, instructed on this.  Referral to ENT as patient is on multiple medications for allergies with no benefit.

## 2024-01-26 NOTE — Assessment & Plan Note (Signed)
 Chronic, ongoing. Currently PCP has taken over care as endocrinologist retired.  A1c 8.9% today in office, slight trend up.  Urine ALB 150 June 2025, continue ACE for kidney protection.  At baseline A1c has been in 8 to 9 range for years, discussed with her goal is to get <7%. - Continue Farxiga  and insulin  regimen at current doses.  Has finished out Trulicity  pens and is changing over to Mounjaro  on Monday as instructed by PCP last visit.  Will have her return in 5 weeks with Dexcom and increase dose as needed.  Discussed with her. - On Statin and ACE - Vaccinations up to date - Eye and Foot exams up to date.

## 2024-01-26 NOTE — Assessment & Plan Note (Signed)
 Order for repeat imaging placed, as recommended.  Discussed with patient today and reviewed.

## 2024-01-26 NOTE — Assessment & Plan Note (Signed)
 Chronic, stable. BP at goal for age today and at goal on home readings.  Continue current medication regimen, Lisinopril  for kidney protection with diabetes, and collaboration with [facility] nephrology.  Recommend she monitor BP at home at least 3 mornings a week.  LABS: CMP.  Urine ALB 150 June 2025.  Focus on DASH diet at home.  Will alert nephrology to labs today.

## 2024-01-26 NOTE — Assessment & Plan Note (Signed)
 Chronic, ongoing. Currently PCP has taken over care as endocrinologist retired.  A1c 8.9% today in office, slight trend up.  Urine ALB 150 June 2025, continue ACE for kidney protection.  At baseline A1c has been in 8 to 9 range for years, discussed with her goal is to get <7%. Continue to collaborate with nephrology and will alert them to CMP results. - Continue Farxiga  and insulin  regimen at current doses.  Has finished out Trulicity  pens and is changing over to Mounjaro  on Monday as instructed by PCP last visit.  Will have her return in 5 weeks with Dexcom and increase dose as needed.  Discussed with her. - On Statin and ACE - Vaccinations up to date - Eye and Foot exams up to date.

## 2024-01-26 NOTE — Assessment & Plan Note (Signed)
 S/P thyroidectomy years ago.  Continue current medication regimen and adjust as needed. Thyroid  labs up to date.

## 2024-01-26 NOTE — Assessment & Plan Note (Signed)
 Chronic, ongoing.  Continue current medication regimen and adjust as needed. Lipid panel today.

## 2024-01-26 NOTE — Progress Notes (Signed)
 BP 129/72   Pulse 71   Temp 97.6 F (36.4 C) (Oral)   Ht 5' 4 (1.626 m)   Wt 167 lb 6 oz (75.9 kg)   LMP  (LMP Unknown)   SpO2 96%   BMI 28.73 kg/m    Subjective:    Patient ID: Mary Parks, female    DOB: [birth year], 73 y.o.   MRN: [id number]  HPI: Mary Parks is a 73 y.o. female  Chief Complaint  Patient presents with   Diabetes   Nasal Congestion   DIABETES Previously followed with Dr. Weyman, endo, but he retired in July.  Her last visit with them was 07/08/23. A1c in June was 8.3%. At last visit requested PCP take over care for thyroid  and diabetes. Has been on the road as grandchild has been sick, so diet has not been good.  Taking Trulicity  4.5 MG but is about to switch over to Mounjaro  on Monday, was finishing out Trulicity  pens first. Continues Lantus  (45 units morning and 45 units at night), Humalog  10 in morning, 10 lunch, and 6 in evening, and Farxiga  10 MG. A1c has remained in 8-9 range for some time, including with endo.  Dexcom report: 90 days average glucose 238, 23% in range, 38% high and 37% very high, 1% low.  Pancreas lesion noted on imaging done by [facility] nephrology on 01/20/22, recommended to repeat in 2 years. Hypoglycemic episodes: none Polydipsia/polyuria: no Visual disturbance: no Chest pain: no Paresthesias: no Glucose Monitoring: yes             Accucheck frequency: Dexcom as above             Fasting glucose:              Post prandial:             Evening:              Before meals: Taking Insulin ?: yes             Long acting insulin : 45 units morning and 45 units at night             Short acting insulin : 10 in morning, 10 lunch, and 6 in evening  Blood Pressure Monitoring: a few times a week Retinal Examination: Up To Date  Foot Exam: Up to Date Pneumovax: Up to Date Influenza: Up to Date Aspirin : yes  ASTHMA & ALLERGIC RHINITIS Uses Symbicort  and Albuterol . Continues to have issues with sinuses and drainage -- takes Singulair  +  takes OTC oral medications + Flonase .  Reports sinus issues are worsening.  Saw ENT in past, but not in long while per patient.  She reports history of being told she had a nasal polyp. Asthma status: exacerbated Satisfied with current treatment?: yes Albuterol /rescue inhaler frequency: occasionally Dyspnea frequency: no Wheezing frequency: no Cough frequency: yes Nocturnal symptom frequency: none Limitation of activity: no Current upper respiratory symptoms: no Aerochamber/spacer use: no Visits to ER or Urgent Care in past year: no Pneumovax: Up to Date Influenza: Up to Date   HYPOTHYROIDISM Takes 75 MCG Levothyroxine .   Continues to follow with rheumatology, last visit 01/28/23.  Watchful waiting. Thyroid  control status:stable Satisfied with current treatment? yes Medication side effects: no Medication compliance: good compliance Etiology of hypothyroidism:  Recent dose adjustment:no Fatigue: no Cold intolerance: no Heat intolerance: no Weight gain: no Weight loss: no Constipation: at baseline -- Metamucil gummies + drinks Milk of Mag Diarrhea/loose stools: no Palpitations: no Lower  extremity edema: no Anxiety/depressed mood: no   CHRONIC KIDNEY DISEASE Last visit with [facility] nephrology was 11/07/23.  She returns in October.  Follows with Dr. Teresia, CKD status: stable Medications renally dose: yes Previous renal evaluation: yes Pneumovax:  Up to Date Influenza Vaccine:  Up to Date  HYPERTENSION / HYPERLIPIDEMIA Taking Metoprolol , Lisinopril  + Crestor  for HLD.   Satisfied with current treatment? yes Duration of hypertension: chronic BP monitoring frequency: weekly BP range: 120/70 or less BP medication side effects: no Duration of hyperlipidemia: chronic Cholesterol medication side effects: no Cholesterol supplements: none Medication compliance: good compliance Aspirin : yes Recent stressors: no Recurrent headaches: no Visual changes: no Palpitations:  no Dyspnea: no Chest pain: no Lower extremity edema: no Dizzy/lightheaded: on occasion, takes Meclizine      01/10/2024    8:08 AM 10/26/2023   11:05 AM 04/25/2023   11:21 AM 02/08/2023    3:40 PM 01/04/2023    8:31 AM  Depression screen PHQ 2/9  Decreased Interest 0 0 0 0 0  Down, Depressed, Hopeless 0 0 0 0 0  PHQ - 2 Score 0 0 0 0 0  Altered sleeping 0 0 0 0 0  Tired, decreased energy 0 0 0 0 0  Change in appetite 0 0 0 0 0  Feeling bad or failure about yourself  0 0 0 0 0  Trouble concentrating 0 0 0 0 0  Moving slowly or fidgety/restless 0 0 0 0 0  Suicidal thoughts 0 0 0 0 0  PHQ-9 Score 0 0 0 0 0  Difficult doing work/chores Not difficult at all Not difficult at all Not difficult at all Not difficult at all Not difficult at all       10/26/2023   11:06 AM 04/25/2023   11:21 AM 02/08/2023    3:40 PM 10/20/2022   11:03 AM  GAD 7 : Generalized Anxiety Score  Nervous, Anxious, on Edge 0 0 0 0  Control/stop worrying 0 0 0 0  Worry too much - different things 0 0 0 0  Trouble relaxing 0 0 0 0  Restless 0 0 0 0  Easily annoyed or irritable 0 0 0 0  Afraid - awful might happen 0 0 0 0  Total GAD 7 Score 0 0 0 0  Anxiety Difficulty Not difficult at all Not difficult at all Not difficult at all Not difficult at all   Relevant past medical, surgical, family and social history reviewed and updated as indicated. Interim medical history since our last visit reviewed. Allergies and medications reviewed and updated.  Review of Systems  Constitutional:  Negative for activity change, appetite change, diaphoresis, fatigue and fever.  Respiratory:  Negative for cough, chest tightness and shortness of breath.   Cardiovascular:  Negative for chest pain, palpitations and leg swelling.  Gastrointestinal: Negative.   Endocrine: Negative for cold intolerance, heat intolerance, polydipsia, polyphagia and polyuria.  Neurological: Negative.   Psychiatric/Behavioral: Negative.     Per HPI unless  specifically indicated above     Objective:    BP 129/72   Pulse 71   Temp 97.6 F (36.4 C) (Oral)   Ht 5' 4 (1.626 m)   Wt 167 lb 6 oz (75.9 kg)   LMP  (LMP Unknown)   SpO2 96%   BMI 28.73 kg/m   Wt Readings from Last 3 Encounters:  01/26/24 167 lb 6 oz (75.9 kg)  01/10/24 165 lb (74.8 kg)  11/13/23 169 lb 15.6 oz (77.1 kg)  Physical Exam Vitals and nursing note reviewed.  Constitutional:      General: She is awake. She is not in acute distress.    Appearance: She is well-developed and well-groomed. She is obese. She is not ill-appearing.  HENT:     Head: Normocephalic.     Right Ear: Hearing and external ear normal. No drainage.     Left Ear: Hearing and external ear normal. No drainage.  Eyes:     General: Lids are normal.        Right eye: No discharge.        Left eye: No discharge.     Conjunctiva/sclera: Conjunctivae normal.     Pupils: Pupils are equal, round, and reactive to light.  Neck:     Vascular: No carotid bruit.  Cardiovascular:     Rate and Rhythm: Normal rate and regular rhythm.     Heart sounds: Normal heart sounds.  Pulmonary:     Effort: Pulmonary effort is normal. No accessory muscle usage or respiratory distress.     Breath sounds: Normal breath sounds.  Abdominal:     General: Bowel sounds are normal. There is no distension.     Palpations: Abdomen is soft.     Tenderness: There is no abdominal tenderness.  Musculoskeletal:     Cervical back: Normal range of motion and neck supple.     Right lower leg: No edema.     Left lower leg: No edema.  Lymphadenopathy:     Cervical: No cervical adenopathy.  Skin:    General: Skin is warm and dry.  Neurological:     Mental Status: She is alert and oriented to person, place, and time.     Motor: Motor function is intact.     Coordination: Coordination is intact.     Gait: Gait is intact.     Deep Tendon Reflexes:     Reflex Scores:      Brachioradialis reflexes are 2+ on the right side and  2+ on the left side.      Patellar reflexes are 2+ on the right side and 2+ on the left side. Psychiatric:        Attention and Perception: Attention normal.        Mood and Affect: Mood normal.        Speech: Speech normal.        Behavior: Behavior normal. Behavior is cooperative.        Thought Content: Thought content normal.    Results for orders placed or performed in visit on 12/12/23  Lab report - scanned   Collection Time: 10/21/23  9:57 AM  Result Value Ref Range   EGFR 32.0    A1c 8.4       Assessment & Plan:   Problem List Items Addressed This Visit       Cardiovascular and Mediastinum   Hypertension associated with chronic kidney disease due to type 2 diabetes mellitus (HCC)   Chronic, stable. BP at goal for age today and at goal on home readings.  Continue current medication regimen, Lisinopril  for kidney protection with diabetes, and collaboration with [facility] nephrology.  Recommend she monitor BP at home at least 3 mornings a week.  LABS: CMP.  Urine ALB 150 June 2025.  Focus on DASH diet at home.  Will alert nephrology to labs today.      Relevant Medications   rosuvastatin  (CRESTOR ) 40 MG tablet   metoprolol  tartrate (LOPRESSOR ) 25 MG tablet  LANTUS  SOLOSTAR 100 UNIT/ML Solostar Pen   Other Relevant Orders   Bayer DCA Hb A1c Waived     Respiratory   Asthma   Chronic, stable on current medication regimen, with minimal use of Albuterol .  Mild exacerbation present, suspect related to poor control of allergic rhinitis and ongoing sinus issues.  Zpack sent in, instructed on this.  Referral to ENT as patient is on multiple medications for allergies with no benefit.      Relevant Medications   budesonide -formoterol  (SYMBICORT ) 160-4.5 MCG/ACT inhaler   Allergic rhinitis   Ongoing with poor control, has tried all OTC oral medications, Singulair , and uses Flonase  daily.  Referral to ENT as patient is on multiple medications for allergies with no benefit.  Continues to  have significant issues with sinuses.      Relevant Orders   Ambulatory referral to ENT     Digestive   Pancreatic lesion   Order for repeat imaging placed, as recommended.  Discussed with patient today and reviewed.      Relevant Orders   MR Abdomen W Wo Contrast     Endocrine   Type 2 diabetes mellitus with diabetic neuropathy, with long-term current use of insulin  (HCC)   Chronic, ongoing. Currently PCP has taken over care as endocrinologist retired.  A1c 8.9% today in office, slight trend up.  Urine ALB 150 June 2025, continue ACE for kidney protection.  At baseline A1c has been in 8 to 9 range for years, discussed with her goal is to get <7%. - Continue Farxiga  and insulin  regimen at current doses.  Has finished out Trulicity  pens and is changing over to Mounjaro  on Monday as instructed by PCP last visit.  Will have her return in 5 weeks with Dexcom and increase dose as needed.  Discussed with her. - On Statin and ACE - Vaccinations up to date - Eye and Foot exams up to date.      Relevant Medications   rosuvastatin  (CRESTOR ) 40 MG tablet   LANTUS  SOLOSTAR 100 UNIT/ML Solostar Pen   Other Relevant Orders   Bayer DCA Hb A1c Waived   Hypothyroidism   S/P thyroidectomy years ago.  Continue current medication regimen and adjust as needed. Thyroid  labs up to date.       Relevant Medications   metoprolol  tartrate (LOPRESSOR ) 25 MG tablet   levothyroxine  (SYNTHROID ) 75 MCG tablet   Hyperlipidemia associated with type 2 diabetes mellitus (HCC)   Chronic, ongoing.  Continue current medication regimen and adjust as needed.  Lipid panel today.      Relevant Medications   rosuvastatin  (CRESTOR ) 40 MG tablet   metoprolol  tartrate (LOPRESSOR ) 25 MG tablet   LANTUS  SOLOSTAR 100 UNIT/ML Solostar Pen   Other Relevant Orders   Bayer DCA Hb A1c Waived   Comprehensive metabolic panel with GFR   Lipid Panel w/o Chol/HDL Ratio   Diabetes mellitus with chronic kidney disease (HCC) -  Primary   Chronic, ongoing. Currently PCP has taken over care as endocrinologist retired.  A1c 8.9% today in office, slight trend up.  Urine ALB 150 June 2025, continue ACE for kidney protection.  At baseline A1c has been in 8 to 9 range for years, discussed with her goal is to get <7%. Continue to collaborate with nephrology and will alert them to CMP results. - Continue Farxiga  and insulin  regimen at current doses.  Has finished out Trulicity  pens and is changing over to Mounjaro  on Monday as instructed by PCP last visit.  Will have her return in 5 weeks with Dexcom and increase dose as needed.  Discussed with her. - On Statin and ACE - Vaccinations up to date - Eye and Foot exams up to date.      Relevant Medications   rosuvastatin  (CRESTOR ) 40 MG tablet   LANTUS  SOLOSTAR 100 UNIT/ML Solostar Pen   Other Relevant Orders   Bayer DCA Hb A1c Waived   Other Visit Diagnoses       Flu vaccine need       Flu vaccine in office today, educated patient.   Relevant Orders   Flu vaccine HIGH DOSE PF(Fluzone Trivalent) (Completed)     COVID-19 vaccine administered       Covid vaccine in office today, educated patient.   Relevant Orders   PFIZER Comirnaty (GRAY TOP)COVID-19 Vaccine (Completed)        Follow up plan: Return in about 5 weeks (around 03/01/2024) for T2DM, started Mounjaro .

## 2024-01-26 NOTE — Assessment & Plan Note (Signed)
 Ongoing with poor control, has tried all OTC oral medications, Singulair , and uses Flonase  daily.  Referral to ENT as patient is on multiple medications for allergies with no benefit.  Continues to have significant issues with sinuses.

## 2024-01-27 ENCOUNTER — Ambulatory Visit: Payer: Self-pay | Admitting: Nurse Practitioner

## 2024-01-27 LAB — COMPREHENSIVE METABOLIC PANEL WITH GFR
ALT: 22 IU/L (ref 0–32)
AST: 20 IU/L (ref 0–40)
Albumin: 4 g/dL (ref 3.8–4.8)
Alkaline Phosphatase: 93 IU/L (ref 44–121)
BUN/Creatinine Ratio: 11 — ABNORMAL LOW (ref 12–28)
BUN: 16 mg/dL (ref 8–27)
Bilirubin Total: 0.4 mg/dL (ref 0.0–1.2)
CO2: 22 mmol/L (ref 20–29)
Calcium: 9.8 mg/dL (ref 8.7–10.3)
Chloride: 108 mmol/L — ABNORMAL HIGH (ref 96–106)
Creatinine, Ser: 1.49 mg/dL — ABNORMAL HIGH (ref 0.57–1.00)
Globulin, Total: 2.8 g/dL (ref 1.5–4.5)
Glucose: 213 mg/dL — ABNORMAL HIGH (ref 70–99)
Potassium: 3.8 mmol/L (ref 3.5–5.2)
Sodium: 143 mmol/L (ref 134–144)
Total Protein: 6.8 g/dL (ref 6.0–8.5)
eGFR: 36 mL/min/1.73 — ABNORMAL LOW (ref >59)

## 2024-01-27 LAB — LIPID PANEL W/O CHOL/HDL RATIO
Cholesterol, Total: 133 mg/dL (ref 100–199)
HDL: 42 mg/dL (ref >39)
LDL Chol Calc (NIH): 74 mg/dL (ref 0–99)
Triglycerides: 90 mg/dL (ref 0–149)
VLDL Cholesterol Cal: 17 mg/dL (ref 5–40)

## 2024-01-27 NOTE — Progress Notes (Signed)
 Contacted via MyChart  Good afternoon Mary Parks, your labs have returned: - Kidney function, eGFR and creatinine, continues to show Stage 3b kidney disease with no worsening.  Continue visits with kidney doctor and I will alert them these labs are in computer. - Lipid panel is stable.  No medication changes needed.  Any questions? Keep being amazing!!  Thank you for allowing me to participate in your care.  I appreciate you. Kindest regards, Onesimo Lingard

## 2024-02-03 ENCOUNTER — Other Ambulatory Visit: Payer: Self-pay | Admitting: Nurse Practitioner

## 2024-02-03 ENCOUNTER — Ambulatory Visit
Admission: RE | Admit: 2024-02-03 | Discharge: 2024-02-03 | Disposition: A | Discharge status: Home / Self Care | Source: Ambulatory Visit | Attending: Nurse Practitioner | Admitting: Nurse Practitioner

## 2024-02-03 DIAGNOSIS — R188 Other ascites: Secondary | ICD-10-CM | POA: Diagnosis not present

## 2024-02-03 DIAGNOSIS — K869 Disease of pancreas, unspecified: Secondary | ICD-10-CM

## 2024-02-03 DIAGNOSIS — R1900 Intra-abdominal and pelvic swelling, mass and lump, unspecified site: Secondary | ICD-10-CM

## 2024-02-03 DIAGNOSIS — K8689 Other specified diseases of pancreas: Secondary | ICD-10-CM | POA: Diagnosis not present

## 2024-02-03 DIAGNOSIS — K449 Diaphragmatic hernia without obstruction or gangrene: Secondary | ICD-10-CM | POA: Diagnosis not present

## 2024-02-03 DIAGNOSIS — N281 Cyst of kidney, acquired: Secondary | ICD-10-CM | POA: Diagnosis not present

## 2024-02-03 MED ORDER — GADOBUTROL 1 MMOL/ML IV SOLN
7.5000 mL | Freq: Once | INTRAVENOUS | Status: AC | PRN
  Administered 2024-02-03: 7.5 mL via INTRAVENOUS

## 2024-02-06 ENCOUNTER — Ambulatory Visit: Payer: Self-pay | Admitting: Nurse Practitioner

## 2024-02-06 NOTE — Telephone Encounter (Signed)
 Spoke with patient via telephone to review recent imaging results.  Discussed that area of concern on pancreas remains stable with recommendation to repeat in 2 years to recheck.  Has a cyst like area to left kidney, recommendation to repeat MRI in 6 months and recheck.  All questions answered and will plan on rechecking as recommended.

## 2024-02-08 DIAGNOSIS — I10 Essential (primary) hypertension: Secondary | ICD-10-CM | POA: Diagnosis not present

## 2024-02-08 DIAGNOSIS — E1159 Type 2 diabetes mellitus with other circulatory complications: Secondary | ICD-10-CM | POA: Diagnosis not present

## 2024-02-08 DIAGNOSIS — Z794 Long term (current) use of insulin: Secondary | ICD-10-CM | POA: Diagnosis not present

## 2024-02-08 DIAGNOSIS — R809 Proteinuria, unspecified: Secondary | ICD-10-CM | POA: Diagnosis not present

## 2024-02-08 DIAGNOSIS — N1832 Chronic kidney disease, stage 3b: Secondary | ICD-10-CM | POA: Diagnosis not present

## 2024-02-20 DIAGNOSIS — N1832 Chronic kidney disease, stage 3b: Secondary | ICD-10-CM | POA: Diagnosis not present

## 2024-02-20 DIAGNOSIS — R809 Proteinuria, unspecified: Secondary | ICD-10-CM | POA: Diagnosis not present

## 2024-02-20 DIAGNOSIS — I10 Essential (primary) hypertension: Secondary | ICD-10-CM | POA: Diagnosis not present

## 2024-02-20 DIAGNOSIS — E1129 Type 2 diabetes mellitus with other diabetic kidney complication: Secondary | ICD-10-CM | POA: Diagnosis not present

## 2024-02-20 DIAGNOSIS — Z794 Long term (current) use of insulin: Secondary | ICD-10-CM | POA: Diagnosis not present

## 2024-02-25 NOTE — Patient Instructions (Incomplete)

## 2024-02-27 NOTE — Progress Notes (Unsigned)
   02/28/2024 Name: Mary Parks MRN: [id number] DOB: [birth year]  Subjective/Objective: Telephone visit to follow-up on management of diabetes  Diabetes: Current medications: Farxiga  10mg  daily, Humalog  SSI TID with meals, Lantus  45 units in the morning and at bedtime, Mounjaro  5mg  weekly -Using Dexcom G7 Sensors for CGM-requesting refills be sent to [facility] (DME supplier) -Endorses FBG 105-120 and 1 hr post-prandial averaging 180-200 -A1c on 9/4 was elevated at 8.9%, up from 8.3% previously -Previously followed by endo, but provider was retiring; so PCP now manages DM -Patient switched from Trulicity  4.5mg  weekly to Mounjaro  5mg  weekly 5 weeks ago.  She endorses tolerating medication well with no adverse side effects. -Patient does experience some constipation at baseline and is using fiber gummies to help with this -Current meal patterns: Breakfast- cereal or sausage and egg Lunch- burger Supper- salad with lean protein Snacks- canned peaches without added sugar Mostly drinks water  or flavored water  without sugar -Does endorse a few instances of BG dropping down to 50-60 during the night; sensor will alert her, and she will have glucose tablets or orange juice to resolve.  She has started only using 22 units of Lantus  at bedtime if BG <200 to help prevent nocturnal hypoglycemia -ACEi/ARB on board for cardiorenal protection:  lisinopril  40mg  daily -Statin on board for ASCVD risk reduction:  rosuvastatin  40mg  daily -Patient does endorse some recent fatigue and lack of energy and is inquiring about something to help with this  Objective:  Lab Results  Component Value Date   HGBA1C 8.9 (H) 01/26/2024   Lab Results  Component Value Date   CREATININE 1.49 (H) 01/26/2024   BUN 16 01/26/2024   NA 143 01/26/2024   K 3.8 01/26/2024   CL 108 (H) 01/26/2024   CO2 22 01/26/2024      Component Value Date/Time   CHOL 133 01/26/2024 1129   CHOL 157 07/07/2015 1039   TRIG 90  01/26/2024 1129   TRIG 135 07/07/2015 1039   HDL 42 01/26/2024 1129   VLDL 27 07/07/2015 1039   LDLCALC 74 01/26/2024 1129   LABVLDL 17 01/26/2024 1129    Assessment/Plan:   Diabetes: -Currently uncontrolled -Continue current regimen at this time -Reported home BG readings vary from expected readings quite a bit based on A1c; advising patient to bring Dexcom receiver in or mobile phone (if using this as a reader) for PCP to review at upcoming visit 10/9 -Patient has 7 weeks remaining of Mounjaro  5mg  weekly; I recommend increasing to 7.5mg  weekly once these have been used as long as she continues to tolerate medication well.  May need to adjust insulin  dosing based on BG readings/A1c at that time. -Advised patient she can try OTC Miralax daily or every other day until on a good bowel regimen, then decrease to PRN.  Make sure to drink plenty of water . -Continue use of Dexcom G7 for CGM- refills sent to [facility] via [facility] -Advised to check cereal labels to make sure choices to not have a lot of added sugars -Consider checking labs for iron, TSH, and vitamins B12 and D for fatigue work up  Follow Up Plan: 1 month  Channing DELENA Mealing, PharmD, DPLA

## 2024-02-28 ENCOUNTER — Other Ambulatory Visit: Payer: Self-pay

## 2024-02-28 DIAGNOSIS — E1140 Type 2 diabetes mellitus with diabetic neuropathy, unspecified: Secondary | ICD-10-CM

## 2024-03-01 ENCOUNTER — Encounter: Payer: Self-pay | Admitting: Nurse Practitioner

## 2024-03-01 ENCOUNTER — Ambulatory Visit (INDEPENDENT_AMBULATORY_CARE_PROVIDER_SITE_OTHER): Admitting: Nurse Practitioner

## 2024-03-01 VITALS — BP 119/74 | HR 76 | Temp 98.1°F | Resp 13 | Ht 64.02 in | Wt 166.8 lb

## 2024-03-01 DIAGNOSIS — Z794 Long term (current) use of insulin: Secondary | ICD-10-CM

## 2024-03-01 DIAGNOSIS — N1831 Chronic kidney disease, stage 3a: Secondary | ICD-10-CM | POA: Diagnosis not present

## 2024-03-01 DIAGNOSIS — E0822 Diabetes mellitus due to underlying condition with diabetic chronic kidney disease: Secondary | ICD-10-CM | POA: Diagnosis not present

## 2024-03-01 MED ORDER — ONDANSETRON 4 MG PO TBDP: 4.0000 mg | ORAL_TABLET | Freq: Three times a day (TID) | ORAL | 1 refills | Status: AC | PRN

## 2024-03-01 NOTE — Progress Notes (Signed)
 BP 119/74 (BP Location: Left Arm, Patient Position: Sitting, Cuff Size: Normal)   Pulse 76   Temp 98.1 F (36.7 C) (Oral)   Resp 13   Ht 5' 4.02 (1.626 m)   Wt 166 lb 12.8 oz (75.7 kg)   LMP  (LMP Unknown)   SpO2 97%   BMI 28.62 kg/m    Subjective:    Patient ID: Mary Parks Public, female    DOB: 10/17/1950, 73 y.o.   MRN: [id number]  HPI: Mary Parks is a 73 y.o. female  Chief Complaint  Patient presents with   Diabetes    Started 5 weeks ago. BS does keeping dropping, more at night. Does have periods of nausea but not often. Does get full quicker and tends to not be as hungry.    DIABETES Last visit A1c was 8.9% in September.  Stopped Trulicity  and started Mounjaro  5 MG weekly. She reports tolerating, having some occasional nausea. Continues Lantus  (45 units morning and 23 units at night), Humalog  10 in morning, 10 lunch, and 6 in evening, and Farxiga  10 MG. A1c has remained in 8-9 range for some time, including with endo. Dexcom report: 90 days average glucose 175, 57% in range, 32% high and 9% very high, 1% low.   HISTORY: Previously followed with Dr. Jadali, endo, but he retired in July.  Her last visit with them was 07/08/23. Pancreas lesion noted on imaging done by [facility] nephrology on 01/20/22, repeat imaging on 02/03/24 stable findings to repeat again in 2 years. Hypoglycemic episodes: as above Polydipsia/polyuria: no Visual disturbance: no Chest pain: no Paresthesias: no Glucose Monitoring: as above  Accucheck frequency: as above  Fasting glucose:  Post prandial:  Evening:  Before meals: Taking Insulin ?: as above  Long acting insulin :  Short acting insulin : Blood Pressure Monitoring: not checking Retinal Examination: Up to Date Foot Exam: Up to Date Diabetic Education: Completed Pneumovax: Up to Date Influenza: Up to Date Aspirin : yes   Relevant past medical, surgical, family and social history reviewed and updated as indicated. Interim medical history since  our last visit reviewed. Allergies and medications reviewed and updated.  Review of Systems  Constitutional:  Negative for activity change, appetite change, diaphoresis, fatigue and fever.  Respiratory:  Negative for cough, chest tightness and shortness of breath.   Cardiovascular:  Negative for chest pain, palpitations and leg swelling.  Gastrointestinal:  Positive for constipation (occasional) and nausea (occasional).  Endocrine: Negative for cold intolerance, heat intolerance, polydipsia, polyphagia and polyuria.  Neurological: Negative.   Psychiatric/Behavioral: Negative.      Per HPI unless specifically indicated above     Objective:    BP 119/74 (BP Location: Left Arm, Patient Position: Sitting, Cuff Size: Normal)   Pulse 76   Temp 98.1 F (36.7 C) (Oral)   Resp 13   Ht 5' 4.02 (1.626 m)   Wt 166 lb 12.8 oz (75.7 kg)   LMP  (LMP Unknown)   SpO2 97%   BMI 28.62 kg/m   Wt Readings from Last 3 Encounters:  03/01/24 166 lb 12.8 oz (75.7 kg)  01/26/24 167 lb 6 oz (75.9 kg)  01/10/24 165 lb (74.8 kg)    Physical Exam Vitals and nursing note reviewed.  Constitutional:      General: She is awake. She is not in acute distress.    Appearance: She is well-developed and well-groomed. She is obese. She is not ill-appearing.  HENT:     Head: Normocephalic.  Right Ear: Hearing and external ear normal. No drainage.     Left Ear: Hearing and external ear normal. No drainage.  Eyes:     General: Lids are normal.        Right eye: No discharge.        Left eye: No discharge.     Conjunctiva/sclera: Conjunctivae normal.     Pupils: Pupils are equal, round, and reactive to light.  Neck:     Vascular: No carotid bruit.  Cardiovascular:     Rate and Rhythm: Normal rate and regular rhythm.     Heart sounds: Normal heart sounds.  Pulmonary:     Effort: Pulmonary effort is normal. No accessory muscle usage or respiratory distress.     Breath sounds: Normal breath sounds.   Abdominal:     General: Bowel sounds are normal. There is no distension.     Palpations: Abdomen is soft.     Tenderness: There is no abdominal tenderness.  Musculoskeletal:     Cervical back: Normal range of motion and neck supple.     Right lower leg: No edema.     Left lower leg: No edema.  Lymphadenopathy:     Cervical: No cervical adenopathy.  Skin:    General: Skin is warm and dry.  Neurological:     Mental Status: She is alert and oriented to person, place, and time.     Motor: Motor function is intact.     Coordination: Coordination is intact.     Gait: Gait is intact.     Deep Tendon Reflexes:     Reflex Scores:      Brachioradialis reflexes are 2+ on the right side and 2+ on the left side.      Patellar reflexes are 2+ on the right side and 2+ on the left side. Psychiatric:        Attention and Perception: Attention normal.        Mood and Affect: Mood normal.        Speech: Speech normal.        Behavior: Behavior normal. Behavior is cooperative.        Thought Content: Thought content normal.     Results for orders placed or performed in visit on 01/26/24  Bayer DCA Hb A1c Waived   Collection Time: 01/26/24 11:28 AM  Result Value Ref Range   HB A1C (BAYER DCA - WAIVED) 8.9 (H) 4.8 - 5.6 %  Comprehensive metabolic panel with GFR   Collection Time: 01/26/24 11:29 AM  Result Value Ref Range   Glucose 213 (H) 70 - 99 mg/dL   BUN 16 8 - 27 mg/dL   Creatinine, Ser 8.50 (H) 0.57 - 1.00 mg/dL   eGFR 36 (L) >40 fO/fpw/8.26   BUN/Creatinine Ratio 11 (L) 12 - 28   Sodium 143 134 - 144 mmol/L   Potassium 3.8 3.5 - 5.2 mmol/L   Chloride 108 (H) 96 - 106 mmol/L   CO2 22 20 - 29 mmol/L   Calcium  9.8 8.7 - 10.3 mg/dL   Total Protein 6.8 6.0 - 8.5 g/dL   Albumin 4.0 3.8 - 4.8 g/dL   Globulin, Total 2.8 1.5 - 4.5 g/dL   Bilirubin Total 0.4 0.0 - 1.2 mg/dL   Alkaline Phosphatase 93 44 - 121 IU/L   AST 20 0 - 40 IU/L   ALT 22 0 - 32 IU/L  Lipid Panel w/o Chol/HDL  Ratio   Collection Time: 01/26/24 11:29 AM  Result  Value Ref Range   Cholesterol, Total 133 100 - 199 mg/dL   Triglycerides 90 0 - 149 mg/dL   HDL 42 >60 mg/dL   VLDL Cholesterol Cal 17 5 - 40 mg/dL   LDL Chol Calc (NIH) 74 0 - 99 mg/dL      Assessment & Plan:   Problem List Items Addressed This Visit       Endocrine   Diabetes mellitus with chronic kidney disease (HCC) - Primary   Chronic, ongoing. Currently PCP has taken over care as endocrinologist retired.  A1c 8.9% in September and Trulicity  was stopped, Mounjaro  started at 5 MG.  She is tolerating, but does get occasional nausea and constipation. Sugars are trending down on review of Dexcom. Urine ALB 150 June 2025, continue ACE for kidney protection.  At baseline A1c has been in 8 to 9 range for years, discussed with her goal is to get <7%. Continue to collaborate with nephrology. - Continue Farxiga  and insulin  regimen at current doses with exception of evening Humalog , recommend she stop this for now due to evening lows since Mounjaro  added. Continue Mounjaro  at 5 MG weekly for now, but plan to increase to 7.5 MG next visit and further reduce insulin . Goal is less insulin  in the long run, which discussed with her.   - Zofran  sent in to use as needed for nausea and to continue bowel regimen which helps with constipation. - On Statin and ACE - Vaccinations up to date - Eye and Foot exams up to date. - Continue to collaborate with Channing PharmD, which she has found beneficial.        Follow up plan: Return in about 3 months (around 06/01/2024) for T2DM, HTN/HLD, THYROID  DISEASE.

## 2024-03-01 NOTE — Assessment & Plan Note (Signed)
 Chronic, ongoing. Currently PCP has taken over care as endocrinologist retired.  A1c 8.9% in September and Trulicity  was stopped, Mounjaro  started at 5 MG.  She is tolerating, but does get occasional nausea and constipation. Sugars are trending down on review of Dexcom. Urine ALB 150 June 2025, continue ACE for kidney protection.  At baseline A1c has been in 8 to 9 range for years, discussed with her goal is to get <7%. Continue to collaborate with nephrology. - Continue Farxiga  and insulin  regimen at current doses with exception of evening Humalog , recommend she stop this for now due to evening lows since Mounjaro  added. Continue Mounjaro  at 5 MG weekly for now, but plan to increase to 7.5 MG next visit and further reduce insulin . Goal is less insulin  in the long run, which discussed with her.   - Zofran  sent in to use as needed for nausea and to continue bowel regimen which helps with constipation. - On Statin and ACE - Vaccinations up to date - Eye and Foot exams up to date. - Continue to collaborate with Ch[facility] PharmD, which she has found beneficial.

## 2024-03-07 DIAGNOSIS — J301 Allergic rhinitis due to pollen: Secondary | ICD-10-CM | POA: Diagnosis not present

## 2024-03-16 DIAGNOSIS — J301 Allergic rhinitis due to pollen: Secondary | ICD-10-CM | POA: Diagnosis not present

## 2024-04-02 NOTE — Progress Notes (Unsigned)
   04/03/2024 Name: Mary Parks MRN: [id number] DOB: 1950-06-16  Subjective/Objective: Telephone visit to follow-up on management of diabetes  Diabetes: Current medications: Farxiga  10mg  daily, Lantus  22 units at bedtime and 45 units in the morning, Mounjaro  5mg  weekly -Using Dexcom G7 Sensors for CGM-requesting refills be sent to [facility] (DME supplier) -A1c on 9/4 was elevated at 8.9%, up from 8.3% previously -Previously followed by endo, but provider was retiring; so PCP now manages DM -Patient was seen by PCP on 10/9 and brought Dexcom data.  Humalog  was stopped at this time, and evening Lantus  dose was decreased to 22 units based on some hypoglycemia. -Patient states she has only had 2 lows since making these changes just over one month ago and states she believes these were due to going too long without eating something. -Ondansetron  was prescribed to use PRN for nausea, but she has only needed to use this once -Patient does experience some constipation at baseline and is using fiber gummies and Miralax PRN; states she very rarely needs Miralax -Current meal patterns: Breakfast- cereal or sausage and egg Lunch- burger Supper- salad with lean protein Snacks- canned peaches without added sugar Mostly drinks water  or flavored water  without sugar -ACEi/ARB on board for cardiorenal protection:  lisinopril  40mg  daily; office BP of 119/74 on 10/9 -Statin on board for ASCVD risk reduction:  rosuvastatin  40mg  daily; LDL 74 on 9/4 -UACR >300  on 6/4  Objective:  Lab Results  Component Value Date   HGBA1C 8.9 (H) 01/26/2024   Lab Results  Component Value Date   CREATININE 1.49 (H) 01/26/2024   BUN 16 01/26/2024   NA 143 01/26/2024   K 3.8 01/26/2024   CL 108 (H) 01/26/2024   CO2 22 01/26/2024      Component Value Date/Time   CHOL 133 01/26/2024 1129   CHOL 157 07/07/2015 1039   TRIG 90 01/26/2024 1129   TRIG 135 07/07/2015 1039   HDL 42 01/26/2024 1129   VLDL 27  07/07/2015 1039   LDLCALC 74 01/26/2024 1129   LABVLDL 17 01/26/2024 1129    Assessment/Plan:   Diabetes: -Currently uncontrolled based on A1c goal <7% -BP is at goal of <130/80 -LDL close to goal of <70 -UACR is not at goal -Continue current regimen at this time -Continue use of Dexcom G7 for CGM- refills sent to [facility] via [facility] -Patient sees PCP again 1/15 and will be due for A1c, UACR, CMP, and lipid panel -If LDL remains slightly >70, I recommend dietary modifications; if significantly >70, consider addition of ezetimibe 10mg  daily  -Per PCP notes 10/9, Plan to increase Mounjaro  to 7.5 MG next visit and further reduce insulin . Goal is less insulin  in the long run.   Follow Up Plan: 3 months  Channing DELENA Mealing, PharmD, DPLA

## 2024-04-03 ENCOUNTER — Other Ambulatory Visit: Payer: Self-pay

## 2024-04-03 DIAGNOSIS — E1122 Type 2 diabetes mellitus with diabetic chronic kidney disease: Secondary | ICD-10-CM

## 2024-04-03 DIAGNOSIS — E1140 Type 2 diabetes mellitus with diabetic neuropathy, unspecified: Secondary | ICD-10-CM

## 2024-04-03 DIAGNOSIS — E1169 Type 2 diabetes mellitus with other specified complication: Secondary | ICD-10-CM

## 2024-04-23 ENCOUNTER — Other Ambulatory Visit: Payer: Self-pay | Admitting: Nurse Practitioner

## 2024-04-26 NOTE — Telephone Encounter (Signed)
 Requested Prescriptions  Pending Prescriptions Disp Refills   fluticasone  (FLONASE ) 50 MCG/ACT nasal spray [Pharmacy Med Name: Fluticasone  Propionate 50 MCG/ACT Nasal Suspension] 48 g 1    Sig: Use 2 spray(s) in each nostril once daily     Ear, Nose, and Throat: Nasal Preparations - Corticosteroids Passed - 04/26/2024  3:21 PM      Passed - Valid encounter within last 12 months    Recent Outpatient Visits           1 month ago 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, with long-term current use of insulin  (HCC)   [place] [facility] [place], [place] T, NP   3 months ago 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, with long-term current use of insulin  (HCC)   [place] [facility] [place], Jolene T, NP   6 months ago Type 2 diabetes mellitus with diabetic neuropathy, with long-term current use of insulin  (HCC)   [place] [facility] [facility] [place] Gil[place], Melanie DASEN, NP

## 2024-04-28 ENCOUNTER — Other Ambulatory Visit: Payer: Self-pay | Admitting: Nurse Practitioner

## 2024-04-30 NOTE — Telephone Encounter (Signed)
 Requested medication (s) are due for refill today:Yes  Requested medication (s) are on the active medication list:Yes  Last refill:  04/25/2023  Future visit if scheduled: 06/07/2024   Requested Renewals   Name from pharmacy: Farxiga  10 MG Oral Tablet       Will file in chart as: FARXIGA  10 MG TABS tablet   Sig: Take 1 tablet by mouth once daily   Disp: 90 tablet    Refills: 0    DAW   Start: 04/28/2024   Class: Normal   Non-formulary   Last ordered: 1 year ago (04/25/2023) by Melanie ONEIDA Polio, NP   Last refill: 01/31/2024   Rx #: [id number]   Endocrinology:  Diabetes - SGLT2 Inhibitors Failed12/10/2023 06:51 AM  Protocol Details Cr in normal range and within 360 days   HBA1C is between 0 and 7.9 and within 180 days   eGFR in normal range and within 360 days   Valid encounter within last 6 months    To be filled at: [id number] - [place], [place] - [address] [place] [address]

## 2024-05-11 ENCOUNTER — Telehealth: Payer: Self-pay

## 2024-05-11 NOTE — Telephone Encounter (Signed)
 Patient was identified as falling into the [place] Measure - Diabetes.   Patient was: Appointment already scheduled for:  06/07/2024.

## 2024-06-02 NOTE — Patient Instructions (Signed)

## 2024-06-04 NOTE — Progress Notes (Signed)
 Mary Parks                                          MRN: [id number]   06/04/2024   The [facility] Quality Team Specialist reviewed this patient medical record for the purposes of chart review for care gap closure. The following were reviewed: abstraction for care gap closure-glycemic status assessment.    [facility] Quality Team

## 2024-06-07 ENCOUNTER — Encounter: Payer: Self-pay | Admitting: Nurse Practitioner

## 2024-06-07 ENCOUNTER — Ambulatory Visit (INDEPENDENT_AMBULATORY_CARE_PROVIDER_SITE_OTHER): Admitting: Nurse Practitioner

## 2024-06-07 VITALS — BP 116/76 | HR 73 | Temp 97.4°F | Resp 17 | Ht 64.02 in | Wt 161.2 lb

## 2024-06-07 DIAGNOSIS — N1831 Chronic kidney disease, stage 3a: Secondary | ICD-10-CM

## 2024-06-07 DIAGNOSIS — J4520 Mild intermittent asthma, uncomplicated: Secondary | ICD-10-CM

## 2024-06-07 DIAGNOSIS — E1140 Type 2 diabetes mellitus with diabetic neuropathy, unspecified: Secondary | ICD-10-CM

## 2024-06-07 DIAGNOSIS — Z794 Long term (current) use of insulin: Secondary | ICD-10-CM | POA: Diagnosis not present

## 2024-06-07 DIAGNOSIS — E119 Type 2 diabetes mellitus without complications: Secondary | ICD-10-CM

## 2024-06-07 DIAGNOSIS — I129 Hypertensive chronic kidney disease with stage 1 through stage 4 chronic kidney disease, or unspecified chronic kidney disease: Secondary | ICD-10-CM | POA: Diagnosis not present

## 2024-06-07 DIAGNOSIS — K869 Disease of pancreas, unspecified: Secondary | ICD-10-CM

## 2024-06-07 DIAGNOSIS — E1169 Type 2 diabetes mellitus with other specified complication: Secondary | ICD-10-CM

## 2024-06-07 DIAGNOSIS — E785 Hyperlipidemia, unspecified: Secondary | ICD-10-CM

## 2024-06-07 DIAGNOSIS — E1122 Type 2 diabetes mellitus with diabetic chronic kidney disease: Secondary | ICD-10-CM | POA: Diagnosis not present

## 2024-06-07 DIAGNOSIS — R208 Other disturbances of skin sensation: Secondary | ICD-10-CM

## 2024-06-07 DIAGNOSIS — E890 Postprocedural hypothyroidism: Secondary | ICD-10-CM

## 2024-06-07 LAB — MICROALBUMIN, URINE WAIVED
Creatinine, Urine Waived: 200 mg/dL (ref 10–300)
Microalb, Ur Waived: 150 mg/L — ABNORMAL HIGH (ref 0–19)
Microalb/Creat Ratio: >300 mg/g — ABNORMAL HIGH

## 2024-06-07 LAB — BAYER DCA HB A1C WAIVED: HB A1C (BAYER DCA - WAIVED): 7.7 % — ABNORMAL HIGH (ref 4.8–5.6)

## 2024-06-07 MED ORDER — MONTELUKAST SODIUM 10 MG PO TABS: 10.0000 mg | ORAL_TABLET | Freq: Every day | ORAL | 4 refills | Status: AC

## 2024-06-07 MED ORDER — PANTOPRAZOLE SODIUM 40 MG PO TBEC: 40.0000 mg | DELAYED_RELEASE_TABLET | Freq: Every day | ORAL | 4 refills | Status: AC

## 2024-06-07 MED ORDER — NYSTATIN 100000 UNIT/GM EX CREA: 1.0000 | TOPICAL_CREAM | Freq: Two times a day (BID) | CUTANEOUS | 1 refills | Status: AC | PRN

## 2024-06-07 MED ORDER — VALACYCLOVIR HCL 1 G PO TABS: 1000.0000 mg | ORAL_TABLET | Freq: Two times a day (BID) | ORAL | 0 refills | Status: AC

## 2024-06-07 MED ORDER — TIRZEPATIDE 5 MG/0.5ML ~~LOC~~ SOAJ: 5.0000 mg | SUBCUTANEOUS | 2 refills | Status: AC

## 2024-06-07 NOTE — Assessment & Plan Note (Signed)
 Chronic, ongoing. Currently PCP has taken over care as endocrinologist retired. Today A1c continues to trend down to 7.7%, this is with poor diet over holidays as well.  She is tolerating, but does get occasional nausea and constipation with Mounjaro . Urine ALB 150 January 2026, continue ACE for kidney protection.  At baseline A1c has been in 8 to 9 range for years, discussed with her goal is to get <7%.  - Continue Farxiga  and insulin  regimen at current doses and may reduce further next visit. Continue Mounjaro  at 5 MG weekly for now, but plan to increase to 7.5 MG next visit and further reduce insulin  if tolerating. Goal is less insulin  in the long run, which discussed with her.   - Zofran  to use as needed for nausea and to continue bowel regimen which helps with constipation. - On Statin and ACE - Vaccinations up to date - Eye and Foot exams up to date. - Continue to collaborate with Channing PharmD, which she has found beneficial.

## 2024-06-07 NOTE — Assessment & Plan Note (Signed)
 S/P thyroidectomy years ago.  Continue current medication regimen and adjust as needed. Thyroid  labs up to date.

## 2024-06-07 NOTE — Assessment & Plan Note (Signed)
 Chronic, ongoing. Currently PCP has taken over care as endocrinologist retired. Today A1c continues to trend down to 7.7%, this is with poor diet over holidays as well.  She is tolerating, but does get occasional nausea and constipation with Mounjaro . Urine ALB 150 January 2026, continue ACE for kidney protection.  At baseline A1c has been in 8 to 9 range for years, discussed with her goal is to get <7%. Neuropathy currently stable without medication. - Continue Farxiga  and insulin  regimen at current doses and may reduce further next visit. Continue Mounjaro  at 5 MG weekly for now, but plan to increase to 7.5 MG next visit and further reduce insulin  if tolerating. Goal is less insulin  in the long run, which discussed with her.   - Zofran  to use as needed for nausea and to continue bowel regimen which helps with constipation. - On Statin and ACE - Vaccinations up to date - Eye and Foot exams up to date. - Continue to collaborate with Channing PharmD, which she has found beneficial.

## 2024-06-07 NOTE — Assessment & Plan Note (Signed)
 Under right breast extending around to back, reports this is uncomfortable. No rash present, but concern this may be early shingles based on her symptoms. Will send in renal dosed Valtrex  for her to take. Nystatin  powder sent in for tinea under breasts.

## 2024-06-07 NOTE — Assessment & Plan Note (Signed)
 Chronic, ongoing.  Continue current medication regimen and adjust as needed. Lipid panel today.

## 2024-06-07 NOTE — Assessment & Plan Note (Addendum)
 Chronic, stable. BP at goal for age today and at goal on home readings.  Continue current medication regimen, Lisinopril  for kidney protection with diabetes, and collaboration with [facility] nephrology.  Recommend she monitor BP at home at least 3 mornings a week.  LABS: A1c, Urine ALB, CMP.  Urine ALB 150 January 2026.  Focus on DASH diet at home.

## 2024-06-07 NOTE — Assessment & Plan Note (Signed)
 Chronic, ongoing with CKD 3b. Currently PCP has taken over care as endocrinologist retired. Today A1c continues to trend down to 7.7%, this is with poor diet over holidays as well.  She is tolerating, but does get occasional nausea and constipation with Mounjaro . Urine ALB 150 January 2026, continue ACE for kidney protection.  At baseline A1c has been in 8 to 9 range for years, discussed with her goal is to get <7%. Continue to collaborate with nephrology. - Continue Farxiga  and insulin  regimen at current doses and may reduce further next visit. Continue Mounjaro  at 5 MG weekly for now, but plan to increase to 7.5 MG next visit and further reduce insulin  if tolerating. Goal is less insulin  in the long run, which discussed with her.   - Zofran  to use as needed for nausea and to continue bowel regimen which helps with constipation. - On Statin and ACE - Vaccinations up to date - Eye and Foot exams up to date. - Continue to collaborate with Channing PharmD, which she has found beneficial.

## 2024-06-07 NOTE — Progress Notes (Signed)
 "  BP 116/76 (BP Location: Left Arm, Patient Position: Sitting, Cuff Size: Normal)   Pulse 73   Temp (!) 97.4 F (36.3 C) (Oral)   Resp 17   Ht 5' 4.02 (1.626 m)   Wt 161 lb 3.2 oz (73.1 kg)   LMP  (LMP Unknown)   SpO2 97%   BMI 27.66 kg/m    Subjective:    Patient ID: [name], female    DOB: 1950-12-23, 74 y.o.   MRN: [id number]  HPI: CLOTILDE LOTH is a 74 y.o. female  Chief Complaint  Patient presents with   Diabetes    Here for diabetes check up, would like to her to check soreness in side and back, it has been there for about a week.   Has had a stinging and burning sensation from under left breast around to back -- over the past few days.   DIABETES A1c 8.9% in September 2025.  Takes Mounjaro  5 MG weekly (gets a little nausea with this) - has lost 6 lbs since start in September, Lantus  (45 units morning and 20 units at night), Humalog  10 in morning, 10 lunch, and 6 in evening, and Farxiga  10 MG. A1c has remained in 8-9 range for long while, including with endo. Dexcom report: 90 days average glucose 182, 52% in range, 34% high and 12% very high, 1% low. Over holidays she reports a worse diet. Does notice less appetite with Mounjaro .  HISTORY: Previously followed with Dr. Jadali, endo, but he retired in July.  Her last visit with them was 07/08/23. Pancreas lesion noted on imaging done by [facility] nephrology on 01/20/22, repeat imaging on 02/03/24 stable findings to repeat again in 2 years. Hypoglycemic episodes: as above Polydipsia/polyuria: no Visual disturbance: no Chest pain: no Paresthesias: no Glucose Monitoring: as above  Accucheck frequency: as above  Fasting glucose:  Post prandial:  Evening:  Before meals: Taking Insulin ?: as above  Long acting insulin :  Short acting insulin : Blood Pressure Monitoring: not checking Retinal Examination: Up to Date Foot Exam: Up to Date Diabetic Education: Completed Pneumovax: Up to Date Influenza: Up to Date Aspirin :  yes   HYPOTHYROIDISM Continues on 75 MCG Levothyroxine .   Continues to follow with rheumatology, last visit 01/28/23.  Watchful waiting. Thyroid  control status:stable Satisfied with current treatment? yes Medication side effects: no Medication compliance: good compliance Etiology of hypothyroidism:  Recent dose adjustment:no Fatigue: no Cold intolerance: no Heat intolerance: no Weight gain: no Weight loss: no Constipation: at baseline -- Metamucil gummies, raisin bran, Milk of Mag Diarrhea/loose stools: no Palpitations: no Lower extremity edema: no Anxiety/depressed mood: no    CHRONIC KIDNEY DISEASE (Stage 3b) [facility] nephrology last on 02/20/24, with Dr. Teresia. CKD status: stable Medications renally dose: yes Previous renal evaluation: yes Pneumovax:  Up to Date Influenza Vaccine:  Up to Date   HYPERTENSION / HYPERLIPIDEMIA Continues Metoprolol , Lisinopril  + Crestor  for HLD.   Satisfied with current treatment? yes Duration of hypertension: chronic BP monitoring frequency: weekly BP range: 120/70 range on average BP medication side effects: no Duration of hyperlipidemia: chronic Cholesterol medication side effects: no Cholesterol supplements: none Medication compliance: good compliance Aspirin : yes Recent stressors: no Recurrent headaches: no Visual changes: no Palpitations: no Dyspnea: no Chest pain: no Lower extremity edema: no Dizzy/lightheaded: on occasion, takes Meclizine      06/07/2024   10:41 AM 03/01/2024   10:12 AM 01/10/2024    8:08 AM 10/26/2023   11:05 AM 04/25/2023   11:21 AM  Depression screen PHQ 2/9  Decreased Interest 0 0 0 0 0  Down, Depressed, Hopeless 0 0 0 0 0  PHQ - 2 Score 0 0 0 0 0  Altered sleeping 0 0 0 0 0  Tired, decreased energy 1 0 0 0 0  Change in appetite 0 0 0 0 0  Feeling bad or failure about yourself  0 0 0 0 0  Trouble concentrating 0 0 0 0 0  Moving slowly or fidgety/restless 0 0 0 0 0  Suicidal thoughts 0 0 0 0 0   PHQ-9 Score 1 0  0  0  0   Difficult doing work/chores Not difficult at all  Not difficult at all Not difficult at all Not difficult at all     Data saved with a previous flowsheet row definition       06/07/2024   10:41 AM 03/01/2024   10:13 AM 10/26/2023   11:06 AM 04/25/2023   11:21 AM  GAD 7 : Generalized Anxiety Score  Nervous, Anxious, on Edge 0 0 0 0  Control/stop worrying 0 0 0 0  Worry too much - different things 0 0 0 0  Trouble relaxing 0 0 0 0  Restless 0 0 0 0  Easily annoyed or irritable 0 0 0 0  Afraid - awful might happen 0 0 0 0  Total GAD 7 Score 0 0 0 0  Anxiety Difficulty Not difficult at all  Not difficult at all Not difficult at all    Relevant past medical, surgical, family and social history reviewed and updated as indicated. Interim medical history since our last visit reviewed. Allergies and medications reviewed and updated.  Review of Systems  Constitutional:  Negative for activity change, appetite change, diaphoresis, fatigue and fever.  Respiratory:  Negative for cough, chest tightness and shortness of breath.   Cardiovascular:  Negative for chest pain, palpitations and leg swelling.  Gastrointestinal:  Positive for constipation (occasional) and nausea (occasional).  Endocrine: Negative for cold intolerance, heat intolerance, polydipsia, polyphagia and polyuria.  Skin:  Negative for rash (but pain extending from under breast to around back).  Neurological: Negative.   Psychiatric/Behavioral: Negative.      Per HPI unless specifically indicated above     Objective:    BP 116/76 (BP Location: Left Arm, Patient Position: Sitting, Cuff Size: Normal)   Pulse 73   Temp (!) 97.4 F (36.3 C) (Oral)   Resp 17   Ht 5' 4.02 (1.626 m)   Wt 161 lb 3.2 oz (73.1 kg)   LMP  (LMP Unknown)   SpO2 97%   BMI 27.66 kg/m   Wt Readings from Last 3 Encounters:  06/07/24 161 lb 3.2 oz (73.1 kg)  03/01/24 166 lb 12.8 oz (75.7 kg)  01/26/24 167 lb 6 oz (75.9  kg)    Physical Exam Vitals and nursing note reviewed.  Constitutional:      General: She is awake. She is not in acute distress.    Appearance: She is well-developed and well-groomed. She is obese. She is not ill-appearing or toxic-appearing.  HENT:     Head: Normocephalic.     Right Ear: Hearing and external ear normal.     Left Ear: Hearing and external ear normal.  Eyes:     General: Lids are normal.        Right eye: No discharge.        Left eye: No discharge.     Conjunctiva/sclera: Conjunctivae normal.  Pupils: Pupils are equal, round, and reactive to light.  Neck:     Thyroid : No thyromegaly.     Vascular: No carotid bruit.  Cardiovascular:     Rate and Rhythm: Normal rate and regular rhythm.     Heart sounds: Normal heart sounds. No murmur heard.    No gallop.  Pulmonary:     Effort: Pulmonary effort is normal. No accessory muscle usage or respiratory distress.     Breath sounds: Normal breath sounds. No decreased breath sounds, wheezing or rales.  Abdominal:     General: Bowel sounds are normal. There is no distension.     Palpations: Abdomen is soft.     Tenderness: There is no abdominal tenderness.  Musculoskeletal:     Cervical back: Normal range of motion and neck supple.     Right lower leg: No edema.     Left lower leg: No edema.  Lymphadenopathy:     Cervical: No cervical adenopathy.  Skin:    General: Skin is warm and dry.     Findings: No rash.     Comments: No rash currently present, but she is having burning pain from under left breast to around back area. Mild erythema, shiny, under both breasts.  Neurological:     Mental Status: She is alert and oriented to person, place, and time.     Deep Tendon Reflexes: Reflexes are normal and symmetric.     Reflex Scores:      Brachioradialis reflexes are 2+ on the right side and 2+ on the left side.      Patellar reflexes are 2+ on the right side and 2+ on the left side. Psychiatric:        Attention  and Perception: Attention normal.        Mood and Affect: Mood normal.        Speech: Speech normal.        Behavior: Behavior normal. Behavior is cooperative.        Thought Content: Thought content normal.    Results for orders placed or performed in visit on 06/07/24  Bayer DCA Hb A1c Waived   Collection Time: 06/07/24 10:42 AM  Result Value Ref Range   HB A1C (BAYER DCA - WAIVED) 7.7 (H) 4.8 - 5.6 %  Microalbumin, Urine Waived   Collection Time: 06/07/24 10:42 AM  Result Value Ref Range   Microalb, Ur Waived 150 (H) 0 - 19 mg/L   Creatinine, Urine Waived 200 10 - 300 mg/dL   Microalb/Creat Ratio >300 (H) <30 mg/g      Assessment & Plan:   Problem List Items Addressed This Visit       Cardiovascular and Mediastinum   Hypertension associated with chronic kidney disease due to type 2 diabetes mellitus (HCC)   Chronic, stable. BP at goal for age today and at goal on home readings.  Continue current medication regimen, Lisinopril  for kidney protection with diabetes, and collaboration with [facility] nephrology.  Recommend she monitor BP at home at least 3 mornings a week.  LABS: A1c, Urine ALB, CMP.  Urine ALB 150 January 2026.  Focus on DASH diet at home.        Relevant Medications   tirzepatide  (MOUNJARO ) 5 MG/0.5ML Pen   Other Relevant Orders   Bayer DCA Hb A1c Waived (Completed)   Microalbumin, Urine Waived (Completed)   Comprehensive metabolic panel with GFR     Endocrine   Type 2 diabetes mellitus with diabetic neuropathy, with  long-term current use of insulin  (HCC) - Primary   Chronic, ongoing. Currently PCP has taken over care as endocrinologist retired. Today A1c continues to trend down to 7.7%, this is with poor diet over holidays as well.  She is tolerating, but does get occasional nausea and constipation with Mounjaro . Urine ALB 150 January 2026, continue ACE for kidney protection.  At baseline A1c has been in 8 to 9 range for years, discussed with her goal is to get <7%.  Neuropathy currently stable without medication. - Continue Farxiga  and insulin  regimen at current doses and may reduce further next visit. Continue Mounjaro  at 5 MG weekly for now, but plan to increase to 7.5 MG next visit and further reduce insulin  if tolerating. Goal is less insulin  in the long run, which discussed with her.   - Zofran  to use as needed for nausea and to continue bowel regimen which helps with constipation. - On Statin and ACE - Vaccinations up to date - Eye and Foot exams up to date. - Continue to collaborate with [facility], which she has found beneficial.      Relevant Medications   tirzepatide  (MOUNJARO ) 5 MG/0.5ML Pen   Other Relevant Orders   Bayer DCA Hb A1c Waived (Completed)   Microalbumin, Urine Waived (Completed)   Insulin -treated type 2 diabetes mellitus (HCC)   Chronic, ongoing. Currently PCP has taken over care as endocrinologist retired. Today A1c continues to trend down to 7.7%, this is with poor diet over holidays as well.  She is tolerating, but does get occasional nausea and constipation with Mounjaro . Urine ALB 150 January 2026, continue ACE for kidney protection.  At baseline A1c has been in 8 to 9 range for years, discussed with her goal is to get <7%.  - Continue Farxiga  and insulin  regimen at current doses and may reduce further next visit. Continue Mounjaro  at 5 MG weekly for now, but plan to increase to 7.5 MG next visit and further reduce insulin  if tolerating. Goal is less insulin  in the long run, which discussed with her.   - Zofran  to use as needed for nausea and to continue bowel regimen which helps with constipation. - On Statin and ACE - Vaccinations up to date - Eye and Foot exams up to date. - Continue to collaborate with [facility], which she has found beneficial.      Relevant Medications   tirzepatide  (MOUNJARO ) 5 MG/0.5ML Pen   Other Relevant Orders   Bayer DCA Hb A1c Waived (Completed)   Microalbumin, Urine Waived (Completed)    Hypothyroidism   S/P thyroidectomy years ago.  Continue current medication regimen and adjust as needed. Thyroid  labs up to date.       Hyperlipidemia associated with type 2 diabetes mellitus (HCC)   Chronic, ongoing.  Continue current medication regimen and adjust as needed.  Lipid panel today.      Relevant Medications   tirzepatide  (MOUNJARO ) 5 MG/0.5ML Pen   Other Relevant Orders   Bayer DCA Hb A1c Waived (Completed)   Comprehensive metabolic panel with GFR   Lipid Panel w/o Chol/HDL Ratio   Diabetes mellitus with chronic kidney disease (HCC)   Chronic, ongoing with CKD 3b. Currently PCP has taken over care as endocrinologist retired. Today A1c continues to trend down to 7.7%, this is with poor diet over holidays as well.  She is tolerating, but does get occasional nausea and constipation with Mounjaro . Urine ALB 150 January 2026, continue ACE for kidney protection.  At baseline A1c  has been in 8 to 9 range for years, discussed with her goal is to get <7%. Continue to collaborate with nephrology. - Continue Farxiga  and insulin  regimen at current doses and may reduce further next visit. Continue Mounjaro  at 5 MG weekly for now, but plan to increase to 7.5 MG next visit and further reduce insulin  if tolerating. Goal is less insulin  in the long run, which discussed with her.   - Zofran  to use as needed for nausea and to continue bowel regimen which helps with constipation. - On Statin and ACE - Vaccinations up to date - Eye and Foot exams up to date. - Continue to collaborate with [facility], which she has found beneficial.      Relevant Medications   tirzepatide  (MOUNJARO ) 5 MG/0.5ML Pen   Other Relevant Orders   Bayer DCA Hb A1c Waived (Completed)   Microalbumin, Urine Waived (Completed)     Other   Burning sensation   Under right breast extending around to back, reports this is uncomfortable. No rash present, but concern this may be early shingles based on her symptoms.  Will send in renal dosed Valtrex  for her to take. Nystatin  powder sent in for tinea under breasts.        Follow up plan: Return in about 3 months (around 09/05/2024) for T2DM, HTN/HLD, Hypothyroid, CKD.      "

## 2024-06-08 ENCOUNTER — Ambulatory Visit: Payer: Self-pay | Admitting: Nurse Practitioner

## 2024-06-08 LAB — COMPREHENSIVE METABOLIC PANEL WITH GFR
ALT: 35 IU/L — ABNORMAL HIGH (ref 0–32)
AST: 30 IU/L (ref 0–40)
Albumin: 4.1 g/dL (ref 3.8–4.8)
Alkaline Phosphatase: 86 IU/L (ref 49–135)
BUN/Creatinine Ratio: 10 — ABNORMAL LOW (ref 12–28)
BUN: 18 mg/dL (ref 8–27)
Bilirubin Total: 0.5 mg/dL (ref 0.0–1.2)
CO2: 24 mmol/L (ref 20–29)
Calcium: 9.9 mg/dL (ref 8.7–10.3)
Chloride: 109 mmol/L — ABNORMAL HIGH (ref 96–106)
Creatinine, Ser: 1.79 mg/dL — ABNORMAL HIGH (ref 0.57–1.00)
Globulin, Total: 2.8 g/dL (ref 1.5–4.5)
Glucose: 174 mg/dL — ABNORMAL HIGH (ref 70–99)
Potassium: 4.1 mmol/L (ref 3.5–5.2)
Sodium: 146 mmol/L — ABNORMAL HIGH (ref 134–144)
Total Protein: 6.9 g/dL (ref 6.0–8.5)
eGFR: 29 mL/min/1.73 — ABNORMAL LOW

## 2024-06-08 LAB — LIPID PANEL W/O CHOL/HDL RATIO
Cholesterol, Total: 130 mg/dL (ref 100–199)
HDL: 38 mg/dL — ABNORMAL LOW
LDL Chol Calc (NIH): 74 mg/dL (ref 0–99)
Triglycerides: 92 mg/dL (ref 0–149)
VLDL Cholesterol Cal: 18 mg/dL (ref 5–40)

## 2024-06-08 NOTE — Progress Notes (Signed)
 Contacted via MyChart  Good morning Mary Parks, your labs have returned and overall remain with baseline levels. Kidney function continues to show ongoing kidney disease with a little trend down from previous check, ensure to get good water  intake daily and try to avoid Ibuprofen products. Sodium, salt, level a little elevated. Reduce salt intake. Lipid panel stable. Continue all current medications. Any questions? Keep being stellar!!  Thank you for allowing me to participate in your care.  I appreciate you. Kindest regards, Casimer Russett

## 2024-07-03 ENCOUNTER — Other Ambulatory Visit

## 2024-09-05 ENCOUNTER — Ambulatory Visit: Admitting: Nurse Practitioner

## 2025-01-15 ENCOUNTER — Ambulatory Visit
